# Patient Record
Sex: Female | Born: 1970 | Race: White | Hispanic: Yes | Smoking: Never smoker
Health system: Southern US, Community
[De-identification: ages and names within clinical notes are randomized; demographics above are authoritative.]

## PROBLEM LIST (undated history)

## (undated) DIAGNOSIS — K589 Irritable bowel syndrome without diarrhea: Secondary | ICD-10-CM

## (undated) DIAGNOSIS — F419 Anxiety disorder, unspecified: Secondary | ICD-10-CM

## (undated) DIAGNOSIS — R0602 Shortness of breath: Secondary | ICD-10-CM

## (undated) DIAGNOSIS — R079 Chest pain, unspecified: Secondary | ICD-10-CM

## (undated) DIAGNOSIS — F329 Major depressive disorder, single episode, unspecified: Secondary | ICD-10-CM

## (undated) DIAGNOSIS — Z9889 Other specified postprocedural states: Secondary | ICD-10-CM

## (undated) DIAGNOSIS — H6690 Otitis media, unspecified, unspecified ear: Secondary | ICD-10-CM

## (undated) DIAGNOSIS — G709 Myoneural disorder, unspecified: Secondary | ICD-10-CM

## (undated) DIAGNOSIS — Z889 Allergy status to unspecified drugs, medicaments and biological substances status: Secondary | ICD-10-CM

## (undated) DIAGNOSIS — T7840XA Allergy, unspecified, initial encounter: Secondary | ICD-10-CM

## (undated) DIAGNOSIS — R112 Nausea with vomiting, unspecified: Secondary | ICD-10-CM

## (undated) DIAGNOSIS — B009 Herpesviral infection, unspecified: Secondary | ICD-10-CM

## (undated) DIAGNOSIS — N951 Menopausal and female climacteric states: Secondary | ICD-10-CM

## (undated) DIAGNOSIS — N3010 Interstitial cystitis (chronic) without hematuria: Secondary | ICD-10-CM

## (undated) DIAGNOSIS — M25512 Pain in left shoulder: Secondary | ICD-10-CM

## (undated) DIAGNOSIS — R41840 Attention and concentration deficit: Secondary | ICD-10-CM

## (undated) DIAGNOSIS — L659 Nonscarring hair loss, unspecified: Secondary | ICD-10-CM

## (undated) DIAGNOSIS — N390 Urinary tract infection, site not specified: Secondary | ICD-10-CM

## (undated) HISTORY — PX: LAPAROSCOPY: SHX197

## (undated) HISTORY — DX: Nonscarring hair loss, unspecified: L65.9

## (undated) HISTORY — DX: Anxiety disorder, unspecified: F41.9

## (undated) HISTORY — DX: Myoneural disorder, unspecified: G70.9

## (undated) HISTORY — DX: Major depressive disorder, single episode, unspecified: F32.9

## (undated) HISTORY — DX: Irritable bowel syndrome, unspecified: K58.9

## (undated) HISTORY — PX: TUBAL LIGATION: SHX77

## (undated) HISTORY — DX: Chest pain, unspecified: R07.9

## (undated) HISTORY — DX: Allergy, unspecified, initial encounter: T78.40XA

## (undated) HISTORY — DX: Urinary tract infection, site not specified: N39.0

## (undated) HISTORY — DX: Herpesviral infection, unspecified: B00.9

## (undated) HISTORY — DX: Allergy status to unspecified drugs, medicaments and biological substances status: Z88.9

## (undated) HISTORY — DX: Shortness of breath: R06.02

## (undated) HISTORY — DX: Otitis media, unspecified, unspecified ear: H66.90

## (undated) HISTORY — DX: Menopausal and female climacteric states: N95.1

## (undated) HISTORY — DX: Pain in left shoulder: M25.512

## (undated) HISTORY — DX: Interstitial cystitis (chronic) without hematuria: N30.10

## (undated) HISTORY — DX: Attention and concentration deficit: R41.840

---
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
## 1998-08-28 ENCOUNTER — Other Ambulatory Visit: Admission: RE | Admit: 1998-08-28 | Discharge: 1998-08-28 | Payer: Self-pay | Admitting: Obstetrics and Gynecology

## 1999-04-17 ENCOUNTER — Ambulatory Visit (HOSPITAL_COMMUNITY): Admission: RE | Admit: 1999-04-17 | Discharge: 1999-04-17 | Payer: Self-pay | Admitting: Urology

## 1999-07-30 ENCOUNTER — Other Ambulatory Visit: Admission: RE | Admit: 1999-07-30 | Discharge: 1999-07-30 | Payer: Self-pay | Admitting: Obstetrics and Gynecology

## 1999-08-04 ENCOUNTER — Ambulatory Visit (HOSPITAL_COMMUNITY): Admission: RE | Admit: 1999-08-04 | Discharge: 1999-08-04 | Payer: Self-pay | Admitting: Gastroenterology

## 2000-03-12 ENCOUNTER — Inpatient Hospital Stay (HOSPITAL_COMMUNITY): Admission: AD | Admit: 2000-03-12 | Discharge: 2000-03-12 | Payer: Self-pay | Admitting: Obstetrics and Gynecology

## 2000-03-18 ENCOUNTER — Inpatient Hospital Stay (HOSPITAL_COMMUNITY): Admission: AD | Admit: 2000-03-18 | Discharge: 2000-03-18 | Payer: Self-pay | Admitting: Obstetrics and Gynecology

## 2000-03-22 ENCOUNTER — Inpatient Hospital Stay (HOSPITAL_COMMUNITY): Admission: AD | Admit: 2000-03-22 | Discharge: 2000-03-22 | Payer: Self-pay | Admitting: Obstetrics & Gynecology

## 2000-04-09 ENCOUNTER — Inpatient Hospital Stay (HOSPITAL_COMMUNITY): Admission: AD | Admit: 2000-04-09 | Discharge: 2000-04-09 | Payer: Self-pay | Admitting: Obstetrics and Gynecology

## 2000-04-18 ENCOUNTER — Inpatient Hospital Stay (HOSPITAL_COMMUNITY): Admission: AD | Admit: 2000-04-18 | Discharge: 2000-04-21 | Payer: Self-pay | Admitting: Obstetrics and Gynecology

## 2000-04-18 ENCOUNTER — Encounter (INDEPENDENT_AMBULATORY_CARE_PROVIDER_SITE_OTHER): Payer: Self-pay

## 2000-05-12 ENCOUNTER — Encounter: Admission: RE | Admit: 2000-05-12 | Discharge: 2000-08-10 | Payer: Self-pay | Admitting: Obstetrics and Gynecology

## 2000-05-20 ENCOUNTER — Other Ambulatory Visit: Admission: RE | Admit: 2000-05-20 | Discharge: 2000-05-20 | Payer: Self-pay | Admitting: Obstetrics and Gynecology

## 2000-05-31 ENCOUNTER — Encounter: Payer: Self-pay | Admitting: Obstetrics and Gynecology

## 2000-05-31 ENCOUNTER — Encounter: Admission: RE | Admit: 2000-05-31 | Discharge: 2000-05-31 | Payer: Self-pay | Admitting: Obstetrics and Gynecology

## 2000-11-14 ENCOUNTER — Encounter: Admission: RE | Admit: 2000-11-14 | Discharge: 2000-11-14 | Payer: Self-pay | Admitting: Obstetrics and Gynecology

## 2000-11-14 ENCOUNTER — Encounter: Payer: Self-pay | Admitting: Obstetrics and Gynecology

## 2001-05-25 ENCOUNTER — Other Ambulatory Visit: Admission: RE | Admit: 2001-05-25 | Discharge: 2001-05-25 | Payer: Self-pay | Admitting: Obstetrics and Gynecology

## 2001-08-31 ENCOUNTER — Other Ambulatory Visit: Admission: RE | Admit: 2001-08-31 | Discharge: 2001-08-31 | Payer: Self-pay | Admitting: Obstetrics and Gynecology

## 2001-11-01 HISTORY — PX: RHINOPLASTY: SUR1284

## 2002-05-25 ENCOUNTER — Other Ambulatory Visit: Admission: RE | Admit: 2002-05-25 | Discharge: 2002-05-25 | Payer: Self-pay | Admitting: Obstetrics and Gynecology

## 2002-07-03 ENCOUNTER — Ambulatory Visit (HOSPITAL_COMMUNITY): Admission: RE | Admit: 2002-07-03 | Discharge: 2002-07-03 | Payer: Self-pay | Admitting: Obstetrics and Gynecology

## 2002-07-03 ENCOUNTER — Encounter: Payer: Self-pay | Admitting: Obstetrics and Gynecology

## 2002-07-04 ENCOUNTER — Encounter: Payer: Self-pay | Admitting: Family Medicine

## 2002-07-04 ENCOUNTER — Encounter: Admission: RE | Admit: 2002-07-04 | Discharge: 2002-07-04 | Payer: Self-pay | Admitting: Family Medicine

## 2002-07-06 ENCOUNTER — Encounter: Payer: Self-pay | Admitting: Obstetrics and Gynecology

## 2002-07-06 ENCOUNTER — Encounter: Admission: RE | Admit: 2002-07-06 | Discharge: 2002-07-06 | Payer: Self-pay | Admitting: Obstetrics and Gynecology

## 2003-05-27 ENCOUNTER — Other Ambulatory Visit: Admission: RE | Admit: 2003-05-27 | Discharge: 2003-05-27 | Payer: Self-pay | Admitting: Obstetrics and Gynecology

## 2003-08-02 ENCOUNTER — Encounter: Payer: Self-pay | Admitting: Obstetrics and Gynecology

## 2003-08-02 ENCOUNTER — Inpatient Hospital Stay (HOSPITAL_COMMUNITY): Admission: AD | Admit: 2003-08-02 | Discharge: 2003-08-02 | Payer: Self-pay | Admitting: Obstetrics and Gynecology

## 2003-09-21 ENCOUNTER — Inpatient Hospital Stay (HOSPITAL_COMMUNITY): Admission: AD | Admit: 2003-09-21 | Discharge: 2003-09-21 | Payer: Self-pay | Admitting: Obstetrics and Gynecology

## 2003-10-05 ENCOUNTER — Inpatient Hospital Stay (HOSPITAL_COMMUNITY): Admission: AD | Admit: 2003-10-05 | Discharge: 2003-10-05 | Payer: Self-pay | Admitting: Obstetrics and Gynecology

## 2003-10-13 ENCOUNTER — Inpatient Hospital Stay (HOSPITAL_COMMUNITY): Admission: AD | Admit: 2003-10-13 | Discharge: 2003-10-14 | Payer: Self-pay | Admitting: Obstetrics and Gynecology

## 2003-10-13 ENCOUNTER — Inpatient Hospital Stay (HOSPITAL_COMMUNITY): Admission: AD | Admit: 2003-10-13 | Discharge: 2003-10-13 | Payer: Self-pay | Admitting: Obstetrics and Gynecology

## 2003-10-16 ENCOUNTER — Inpatient Hospital Stay (HOSPITAL_COMMUNITY): Admission: AD | Admit: 2003-10-16 | Discharge: 2003-10-16 | Payer: Self-pay | Admitting: Obstetrics and Gynecology

## 2003-10-21 ENCOUNTER — Inpatient Hospital Stay (HOSPITAL_COMMUNITY): Admission: AD | Admit: 2003-10-21 | Discharge: 2003-10-21 | Payer: Self-pay | Admitting: *Deleted

## 2003-10-28 ENCOUNTER — Inpatient Hospital Stay (HOSPITAL_COMMUNITY): Admission: AD | Admit: 2003-10-28 | Discharge: 2003-10-28 | Payer: Self-pay | Admitting: Obstetrics and Gynecology

## 2003-10-31 ENCOUNTER — Inpatient Hospital Stay (HOSPITAL_COMMUNITY): Admission: RE | Admit: 2003-10-31 | Discharge: 2003-11-04 | Payer: Self-pay | Admitting: Obstetrics and Gynecology

## 2003-10-31 ENCOUNTER — Encounter (INDEPENDENT_AMBULATORY_CARE_PROVIDER_SITE_OTHER): Payer: Self-pay | Admitting: Specialist

## 2003-11-05 ENCOUNTER — Encounter: Admission: RE | Admit: 2003-11-05 | Discharge: 2003-12-05 | Payer: Self-pay | Admitting: Obstetrics and Gynecology

## 2003-12-06 ENCOUNTER — Encounter: Admission: RE | Admit: 2003-12-06 | Discharge: 2004-01-05 | Payer: Self-pay | Admitting: Obstetrics and Gynecology

## 2003-12-09 ENCOUNTER — Other Ambulatory Visit: Admission: RE | Admit: 2003-12-09 | Discharge: 2003-12-09 | Payer: Self-pay | Admitting: Obstetrics and Gynecology

## 2004-02-03 ENCOUNTER — Encounter: Admission: RE | Admit: 2004-02-03 | Discharge: 2004-03-04 | Payer: Self-pay | Admitting: Obstetrics and Gynecology

## 2004-04-04 ENCOUNTER — Encounter: Admission: RE | Admit: 2004-04-04 | Discharge: 2004-05-04 | Payer: Self-pay | Admitting: Obstetrics and Gynecology

## 2004-06-04 ENCOUNTER — Encounter: Admission: RE | Admit: 2004-06-04 | Discharge: 2004-07-04 | Payer: Self-pay | Admitting: Obstetrics and Gynecology

## 2004-07-05 ENCOUNTER — Encounter: Admission: RE | Admit: 2004-07-05 | Discharge: 2004-08-04 | Payer: Self-pay | Admitting: Obstetrics and Gynecology

## 2005-01-18 ENCOUNTER — Other Ambulatory Visit: Admission: RE | Admit: 2005-01-18 | Discharge: 2005-01-18 | Payer: Self-pay | Admitting: Obstetrics and Gynecology

## 2005-03-19 ENCOUNTER — Ambulatory Visit: Payer: Self-pay

## 2005-04-02 ENCOUNTER — Ambulatory Visit (HOSPITAL_COMMUNITY): Admission: RE | Admit: 2005-04-02 | Discharge: 2005-04-02 | Payer: Self-pay | Admitting: Obstetrics and Gynecology

## 2005-04-04 ENCOUNTER — Inpatient Hospital Stay (HOSPITAL_COMMUNITY): Admission: AD | Admit: 2005-04-04 | Discharge: 2005-04-04 | Payer: Self-pay | Admitting: Specialist

## 2005-11-01 HISTORY — PX: BREAST ENHANCEMENT SURGERY: SHX7

## 2005-11-01 HISTORY — PX: COLONOSCOPY: SHX174

## 2006-01-31 ENCOUNTER — Encounter: Admission: RE | Admit: 2006-01-31 | Discharge: 2006-01-31 | Payer: Self-pay | Admitting: Plastic Surgery

## 2006-02-09 ENCOUNTER — Other Ambulatory Visit: Admission: RE | Admit: 2006-02-09 | Discharge: 2006-02-09 | Payer: Self-pay | Admitting: Obstetrics and Gynecology

## 2006-08-04 ENCOUNTER — Ambulatory Visit: Payer: Self-pay | Admitting: Obstetrics & Gynecology

## 2006-09-20 ENCOUNTER — Ambulatory Visit (HOSPITAL_COMMUNITY): Admission: RE | Admit: 2006-09-20 | Discharge: 2006-09-20 | Payer: Self-pay | Admitting: Gynecology

## 2007-01-24 ENCOUNTER — Encounter: Admission: RE | Admit: 2007-01-24 | Discharge: 2007-04-24 | Payer: Self-pay | Admitting: Family Medicine

## 2007-02-16 ENCOUNTER — Encounter: Admission: RE | Admit: 2007-02-16 | Discharge: 2007-02-16 | Payer: Self-pay | Admitting: Obstetrics & Gynecology

## 2007-04-12 ENCOUNTER — Encounter: Payer: Self-pay | Admitting: Obstetrics & Gynecology

## 2007-04-12 ENCOUNTER — Ambulatory Visit: Payer: Self-pay | Admitting: Obstetrics & Gynecology

## 2007-05-04 ENCOUNTER — Encounter: Payer: Self-pay | Admitting: Cardiology

## 2007-05-24 ENCOUNTER — Encounter: Payer: Self-pay | Admitting: Cardiology

## 2007-06-22 ENCOUNTER — Ambulatory Visit: Payer: Self-pay | Admitting: Obstetrics & Gynecology

## 2007-08-10 ENCOUNTER — Ambulatory Visit: Payer: Self-pay | Admitting: Obstetrics & Gynecology

## 2007-08-29 ENCOUNTER — Ambulatory Visit (HOSPITAL_BASED_OUTPATIENT_CLINIC_OR_DEPARTMENT_OTHER): Admission: RE | Admit: 2007-08-29 | Discharge: 2007-08-29 | Payer: Self-pay | Admitting: Urology

## 2008-04-26 ENCOUNTER — Ambulatory Visit (HOSPITAL_COMMUNITY): Admission: RE | Admit: 2008-04-26 | Discharge: 2008-04-26 | Payer: Self-pay | Admitting: Gynecology

## 2008-04-26 ENCOUNTER — Ambulatory Visit: Payer: Self-pay | Admitting: Obstetrics & Gynecology

## 2008-05-15 ENCOUNTER — Ambulatory Visit: Payer: Self-pay | Admitting: Obstetrics & Gynecology

## 2008-05-15 ENCOUNTER — Encounter: Payer: Self-pay | Admitting: Obstetrics & Gynecology

## 2008-05-21 ENCOUNTER — Ambulatory Visit (HOSPITAL_COMMUNITY): Admission: RE | Admit: 2008-05-21 | Discharge: 2008-05-21 | Payer: Self-pay | Admitting: Obstetrics & Gynecology

## 2008-07-15 ENCOUNTER — Ambulatory Visit: Payer: Self-pay | Admitting: Obstetrics & Gynecology

## 2008-09-23 ENCOUNTER — Ambulatory Visit: Payer: Self-pay | Admitting: Obstetrics & Gynecology

## 2008-09-23 LAB — CONVERTED CEMR LAB
Chlamydia, DNA Probe: NEGATIVE
Clue Cells Wet Prep HPF POC: NONE SEEN
GC Probe Amp, Genital: NEGATIVE
Trich, Wet Prep: NONE SEEN
WBC, Wet Prep HPF POC: NONE SEEN

## 2008-11-29 ENCOUNTER — Encounter: Admission: RE | Admit: 2008-11-29 | Discharge: 2008-11-29 | Payer: Self-pay | Admitting: Obstetrics & Gynecology

## 2009-02-13 ENCOUNTER — Ambulatory Visit: Payer: Self-pay | Admitting: Obstetrics & Gynecology

## 2009-02-14 ENCOUNTER — Encounter: Admission: RE | Admit: 2009-02-14 | Discharge: 2009-02-14 | Payer: Self-pay | Admitting: Obstetrics & Gynecology

## 2009-05-29 ENCOUNTER — Ambulatory Visit: Payer: Self-pay | Admitting: Obstetrics & Gynecology

## 2009-05-29 ENCOUNTER — Encounter: Payer: Self-pay | Admitting: Obstetrics & Gynecology

## 2009-05-29 LAB — CONVERTED CEMR LAB
Albumin: 4.8 g/dL (ref 3.5–5.2)
CO2: 27 meq/L (ref 19–32)
Chloride: 107 meq/L (ref 96–112)
Cholesterol: 154 mg/dL (ref 0–200)
Platelets: 183 10*3/uL (ref 150–400)
RDW: 13.3 % (ref 11.5–15.5)
Sodium: 142 meq/L (ref 135–145)
Total Protein: 6.7 g/dL (ref 6.0–8.3)
Triglycerides: 46 mg/dL (ref <150)
WBC: 3.9 10*3/uL — ABNORMAL LOW (ref 4.0–10.5)

## 2009-06-03 ENCOUNTER — Ambulatory Visit: Payer: Self-pay | Admitting: Obstetrics & Gynecology

## 2009-06-03 ENCOUNTER — Encounter: Payer: Self-pay | Admitting: Obstetrics & Gynecology

## 2009-06-03 LAB — CONVERTED CEMR LAB: HCV Ab: NEGATIVE

## 2009-06-23 ENCOUNTER — Encounter: Admission: RE | Admit: 2009-06-23 | Discharge: 2009-06-23 | Payer: Self-pay | Admitting: Obstetrics & Gynecology

## 2009-06-25 ENCOUNTER — Ambulatory Visit (HOSPITAL_COMMUNITY): Admission: RE | Admit: 2009-06-25 | Discharge: 2009-06-25 | Payer: Self-pay | Admitting: Obstetrics & Gynecology

## 2009-07-16 ENCOUNTER — Ambulatory Visit: Payer: Self-pay | Admitting: Obstetrics & Gynecology

## 2009-07-31 ENCOUNTER — Ambulatory Visit: Payer: Self-pay | Admitting: Obstetrics & Gynecology

## 2009-07-31 LAB — CONVERTED CEMR LAB
Chlamydia, Swab/Urine, PCR: NEGATIVE
GC Probe Amp, Urine: NEGATIVE

## 2009-11-01 HISTORY — PX: UPPER GASTROINTESTINAL ENDOSCOPY: SHX188

## 2009-12-01 ENCOUNTER — Encounter: Admission: RE | Admit: 2009-12-01 | Discharge: 2009-12-01 | Payer: Self-pay | Admitting: Obstetrics & Gynecology

## 2009-12-02 ENCOUNTER — Ambulatory Visit: Payer: Self-pay | Admitting: Obstetrics & Gynecology

## 2009-12-02 LAB — CONVERTED CEMR LAB
Platelets: 187 10*3/uL (ref 150–400)
RDW: 12.9 % (ref 11.5–15.5)
TSH: 1.682 microintl units/mL (ref 0.350–4.500)
WBC: 6.4 10*3/uL (ref 4.0–10.5)

## 2010-01-26 ENCOUNTER — Ambulatory Visit: Payer: Self-pay | Admitting: Obstetrics & Gynecology

## 2010-01-27 ENCOUNTER — Encounter: Payer: Self-pay | Admitting: Obstetrics & Gynecology

## 2010-06-26 ENCOUNTER — Ambulatory Visit: Payer: Self-pay | Admitting: Gynecology

## 2010-06-26 ENCOUNTER — Other Ambulatory Visit: Admission: RE | Admit: 2010-06-26 | Discharge: 2010-06-26 | Payer: Self-pay | Admitting: Gynecology

## 2010-07-03 ENCOUNTER — Ambulatory Visit: Payer: Self-pay | Admitting: Gynecology

## 2010-09-16 ENCOUNTER — Ambulatory Visit: Payer: Self-pay | Admitting: Gynecology

## 2010-09-17 ENCOUNTER — Ambulatory Visit: Payer: Self-pay | Admitting: Gynecology

## 2010-09-22 ENCOUNTER — Encounter: Payer: Self-pay | Admitting: Cardiology

## 2010-09-22 DIAGNOSIS — R079 Chest pain, unspecified: Secondary | ICD-10-CM

## 2010-09-22 DIAGNOSIS — R0602 Shortness of breath: Secondary | ICD-10-CM

## 2010-09-22 HISTORY — DX: Shortness of breath: R06.02

## 2010-09-22 HISTORY — DX: Chest pain, unspecified: R07.9

## 2010-09-29 ENCOUNTER — Encounter: Payer: Self-pay | Admitting: Cardiology

## 2010-11-22 ENCOUNTER — Encounter: Payer: Self-pay | Admitting: Plastic Surgery

## 2010-12-14 ENCOUNTER — Other Ambulatory Visit: Payer: Self-pay | Admitting: Gynecology

## 2010-12-14 DIAGNOSIS — Z1231 Encounter for screening mammogram for malignant neoplasm of breast: Secondary | ICD-10-CM

## 2010-12-30 ENCOUNTER — Ambulatory Visit
Admission: RE | Admit: 2010-12-30 | Discharge: 2010-12-30 | Disposition: A | Discharge status: Home / Self Care | Payer: BC Managed Care – PPO | Source: Ambulatory Visit | Attending: Gynecology | Admitting: Gynecology

## 2010-12-30 DIAGNOSIS — Z1231 Encounter for screening mammogram for malignant neoplasm of breast: Secondary | ICD-10-CM

## 2011-01-01 ENCOUNTER — Other Ambulatory Visit: Payer: Self-pay | Admitting: Gynecology

## 2011-01-01 DIAGNOSIS — R928 Other abnormal and inconclusive findings on diagnostic imaging of breast: Secondary | ICD-10-CM

## 2011-01-12 ENCOUNTER — Ambulatory Visit
Admission: RE | Admit: 2011-01-12 | Discharge: 2011-01-12 | Disposition: A | Discharge status: Home / Self Care | Payer: BC Managed Care – PPO | Source: Ambulatory Visit | Attending: Gynecology | Admitting: Gynecology

## 2011-01-12 DIAGNOSIS — R928 Other abnormal and inconclusive findings on diagnostic imaging of breast: Secondary | ICD-10-CM

## 2011-02-05 LAB — POCT URINALYSIS DIP (DEVICE)
Nitrite: NEGATIVE
Protein, ur: NEGATIVE mg/dL
Urobilinogen, UA: 0.2 mg/dL (ref 0.0–1.0)

## 2011-03-16 NOTE — Group Therapy Note (Signed)
[name], [name]             ACCOUNT NO.:  [id number]   MEDICAL RECORD NO.:  [id number]          PATIENT TYPE:  WOC   LOCATION[facility]                   FACILITY:  [facility]   PHYSICIAN:  Elsie Lincoln, MD      DATE OF BIRTH:  1971/02/23   DATE OF SERVICE:  02/13/2009                                  CLINIC NOTE   Patient is a 40 year old female who presents for complaints of feeling a  left mass in her axilla/breast.  This is new to her.  She found it in  the shower this morning.  No other complaints.  No discharge from the  nipple and no skin changes.   PHYSICAL EXAM:  Temperature 99.1.  Pulse 80.  Blood pressure 103/58.  Weight 118.4.  Height 5 feet 6 inches.  GENERAL:  Well nourished, well developed, no apparent distress.  HEENT:  Normocephalic, atraumatic.  CHEST:  Normal movement.  Left breast, no nipple discharge, skin changes  or mass.  When standing up on the left side when you pull her chest wall  in between your 2 fingers near her axilla, you can feel a slight bump.  This could possibly be a mass in the breast tissue, a lymph node, or a  muscle spasm in the pectoralis.   Patient is requesting followup scans.  I think this would be reasonable  so we ordered a diagnostic mammogram/ultrasound of the left axilla  breast.  The patient is scheduled for tomorrow and we will get the  followup results and contact the patient.  Patient will return p.r.n.           ______________________________  Elsie Lincoln, MD     KL/MEDQ  D:  02/13/2009  T:  02/13/2009  Job:  [id number]

## 2011-03-16 NOTE — Assessment & Plan Note (Signed)
NAMEMISSY, BAKSH             ACCOUNT NO.:  [id number]   MEDICAL RECORD NO.:  [id number]          PATIENT TYPE:  POB   LOCATION:  [facility]         FACILITY:  [facility]   PHYSICIAN:  Elsie Lincoln, MD      DATE OF BIRTH:  1971-05-15   DATE OF SERVICE:  05/15/2008                                  CLINIC NOTE   The patient is a 40 year old para 2 female, LMP May 08, 2008, who  presents for a physical exam.  The patient has been experiencing some  right lower quadrant pain intermittently and now daily that lasted for a  little bit and goes away.  She has had transvaginal ultrasound done on  April 26, 2008, which showed completely normal-appearing ovaries and  uterus.  There was a small amount of simple free fluid in the cul-de-  sac, possibly had ruptured a cyst.  She also does have interstitial  cystitis and irritable bowel syndrome, and she thinks she may have  fibromyalgia, which has never been diagnosed.  Dr. Logan Bores follows her  interstitial cystitis.  She thinks that she might be getting a flare.  I  did do urine culture recently, and it was negative; this was done at the  [facility] [place].  She also is followed by [facility] for  irritable bowel syndrome and had a recent negative colonoscopy.  She  could be having pain from gas or irritable bowel syndrome, and if the  pain continues I think she should at least talk to her  gastroenterologist.  There is a rare chance she could have chronic  appendicitis, so we could do a CT with IV and p.o. contrast.  She could  have scar tissue from her prior 2 C-sections, however, I find this  unlikely given that she has not experienced the pain 4 years after  delivery.  Knowing [place] very well, she does have a high anxiety  level when it comes to her health.  She has this longevity plan and  wants everything investigated to the hilt.  I warned her about doing  tests, that they may find some findings that would need to be  investigated but have no real pathological reason to.  I will be very  hesitant to do an exploratory laparoscopy on her given that I think that  this could just be normal pain associated with ovulation or it could be  related to her irritable bowel syndrome.  She was on Wellbutrin for  possible decreased mood, maybe depression.  She said it started causing  her daily headaches, and she is weaning herself off this.  I do think  she would benefit from some SSRI; however, she does not want any sexual  side effects associated with SSRIs.  If she continues to have daily  headaches off the Wellbutrin then I would suggest she go see her primary  care physician to evaluate chronic headaches.  The patient uses BTL and  vasectomy for birth control.   PAST MEDICAL HISTORY:  1. Irritable bowel syndrome.  2. Borderline hypercholesterolemia controlled with diet, exercise, and      fish oil.  3. Interstitial cystitis.   PAST SURGICAL HISTORY:  C-section x2, breast augmentation in May 2007,  and laparoscopy in May 2006 by Dr. Henderson Cloud.   GYNECOLOGICAL HISTORY:  No abnormal Pap smear.  C-section x2, 1  miscarriage, and tubal ligation.  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   FAMILY HISTORY:  Brother has high blood pressure.  The patient denies  diabetes or familial cancers, and knows nothing about her father's  family history as he is not involved in her life.   SOCIAL HISTORY:  No drinking, drugs, or tobacco.  She exercises a lot  and, of note, she has had a 13-pound weight loss since she was last  here.  She was trying to lose some weight; however, also again knowing  [place] personally, I do know that her portions have decreased  tremendously.  She states she is not trying to lose weight; however, I  am slightly worried that she has had a 13-pound weight loss and does not  seem to be eating larger portions.  However, we did calculate her BMI,  and it is 19.5 and it is normal.  She does exercise but  not overly so.  I will keep an eye on her weight over the next year to make sure she  does not decrease to underweight.  Also of note, she had Botox recently  around her eyes, and she has some bruising around her eyes.   MEDICATION:  1. Metamucil Plus Calcium.  2. Unpasteurized apple vinegar.  3. Green tea.  4. Juice Plus.  5. Fish oil capsule.  6. Valtrex.  7. Wellbutrin.  8. Flonase.  9. Allegra-D.   PHYSICAL EXAMINATION:  GENERAL:  Well nourished, well developed, in no  apparent distress.  VITAL SIGNS:  Pulse 70, blood pressure 94/58, and weight 120.  HEENT:  Normocephalic.  Slight bruising underneath her right eye from  Botox.  Good dentition.  THYROID:  No masses.  LUNGS:  Clear to auscultation bilaterally.  HEART:  Regular rate and rhythm.  BREASTS:  Evidence of implants.  No nipple discharge.  No  lymphadenopathy.  No masses.  ABDOMEN:  Soft, nontender.  No organomegaly.  No hernia.  No pain with  palpation.  GENITALIA:  Tanner V.  Vagina pink, normal rugae.  Urethra and bladder  well suspended.  Cervix closed, nontender.  Uterus anteverted,  nontender.  Adnexa:  Both ovaries palpated non painfully and small.  The  patient had some pain to deep palpation on bimanual but not in the area  of the ovary in the right lower quadrant.  EXTREMITIES:  No edema.   ASSESSMENT AND PLAN:  A 40 year old female with normal gynecological  exam.  1. Pap smear.  2. TSH, cholesterol panel, and basic metabolic panel today.  She is      fasting.  3. Mammogram next year when she is 38.  4. Right lower quadrant pain.  I do not think seems to be      gynecological in origin.  If she continues to have the pain, I      would suggest that she go see her gastrointestinal doctor before      proceeding with any other invasive test.           ______________________________  Elsie Lincoln, MD     KL/MEDQ  D:  05/15/2008  T:  05/16/2008  Job:  [id number]

## 2011-03-16 NOTE — Group Therapy Note (Signed)
[name], [name]             ACCOUNT NO.:  [id number]   MEDICAL RECORD NO.:  [id number]          PATIENT TYPE:  WOC   [facility]ATION[facility]  [facility]                   FACILITY:  [facility]   PHYSICIAN:  Elsie Lincoln, MD      DATE OF BIRTH:  Jan 13, 1971   DATE OF SERVICE:                                  CLINIC NOTE   The patient is a 40 year old para 2 female who presents with urethral  pain, right lower quadrant pain, and burning.  The patient is a known  patient of Dr. Logan Bores from [facility] for interstitial cystitis.  He is now into [facility], and she is attempting to reinitiate care  with Dr. Alfredo Martinez, and also to get a second opinion.  This is  her second flare in the past 3 months.  She is on Elmiron currently, and  I have prescribed her tramadol p.r.n.  Over the past couple of months,  she has gone through approximately 35 pills in 3 months.  The patient is  due to see Dr. Sherron Monday on October 14, 2008.  The patient was given  Diflucan empirically over the weekend, which did not improve the  symptoms.  She was able to have sex last night, which was a little less  painful than it had the last time they attempted.   PHYSICAL EXAMINATION:  Vulva slightly erythematous.  Vagina pink, normal  rugae with thin amount of discharge.  It could be semen from the  intercourse several hours ago.  Cervix close, nontender.  Urethra mildly  tender.  Bladder nontender.  Uterus mildly tender.  Right lower quadrant  moderately tender.  No masses.  Left adnexa no masses, nontender.   ASSESSMENT AND PLAN:  A 40 year old female with pelvic pain.  1. Cystitis.  I spoke with Dr. Alfredo Martinez, discussed case with      him, and he suggested Mobic and Prilosec.  I wrote these      prescriptions for her.  He will see her on October 14, 2008 and      determine if she needs an instillations of her bladder for      treatment.  2. Wet prep, yeast culture, gonorrhea, and chlamydia sent.  3.  Urinalysis sent, which showed positive nitrite, 30 mg of protein,      trace ketones, small bilirubin.  We will send a culture.  Usually,      the patient does not have any nitrates or protein, so that she      could also have an urinary tract infection.  4. The patient is 5 feet 6 inches and 115 pounds.  She has lost over      15 pounds in the past 2 years.  We have talked about this in depth,      and she states that when she is upset, she does not eat and when      she is in pain, she does not eat.  However, today when she weighed      in, she was very concerned about taking off her shoes and jacket in      order  to achieve the lowest reading.  I am somewhat concerned about      her weight loss and lack of eating.  Also, she has ketones in her      urine, which would show she has not eaten much in the morning.  We      will address this at a later visit.           ______________________________  Elsie Lincoln, MD     KL/MEDQ  D:  09/23/2008  T:  09/24/2008  Job:  [id number]

## 2011-03-16 NOTE — Assessment & Plan Note (Signed)
[name], [name]             ACCOUNT NO.:  [id number]   MEDICAL RECORD NO.:  [id number]          PATIENT TYPE:  POB   LOCATION:  [facility]         FACILITY:  [facility]   PHYSICIAN:  Elsie Lincoln, MD      DATE OF BIRTH:  02-Feb-1971   DATE OF SERVICE:  05/29/2009                                  CLINIC NOTE   The patient is a 40 year old female, who presents for yearly exam.  This  is an addendum to the last note.  The patient has been exercising and  has a normal body weight.  She still experiences lower calf achiness and  has been wearing compression stockings as she is today.  I believe that  the patient would benefit from more definitive therapy, and I am  referring her back to her pain specialist with this note.           ______________________________  Elsie Lincoln, MD     KL/MEDQ  D:  05/29/2009  T:  05/30/2009  Job:  [id number]

## 2011-03-16 NOTE — Assessment & Plan Note (Signed)
NAMEJAIDALYN, [name]             ACCOUNT NO.:  [id number]   MEDICAL RECORD NO.:  [id number]          PATIENT TYPE:  POB   LOCATION:  [facility]         FACILITY:  [facility]   PHYSICIAN:  Elsie Lincoln, MD      DATE OF BIRTH:  08/13/1971   DATE OF SERVICE:  07/16/2009                                  CLINIC NOTE   The patient is a 40 year old para 2 female who presents for abnormal  uterine bleeding.  The patient continues to have 9+ days periods with 21-  day cycle at most.  She had a transvaginal ultrasound that revealed a  normal uterus and ovaries, most likely this is due to her low BMI;  however, we are going to do endometrial biopsy today to ensure that  there is no problem with her endometrial lining.  Informed consent was  obtained.  The uterus sounded to approximately 8 cm and good tissue was  obtained without problem.  The patient will be called with the results.  UPT was negative prior to the procedure.           ______________________________  Elsie Lincoln, MD     KL/MEDQ  D:  07/16/2009  T:  07/16/2009  Job:  [id number]

## 2011-03-16 NOTE — Assessment & Plan Note (Signed)
[name], SHIDLER             ACCOUNT NO.:  [id number]   MEDICAL RECORD NO.:  [id number]          PATIENT TYPE:  POB   LOCATION:  [facility]         FACILITY:  [facility]   PHYSICIAN:  Elsie Lincoln, MD      DATE OF BIRTH:  [birth year]   DATE OF SERVICE:  08/04/2006                                    CLINIC NOTE   HISTORY OF PRESENT ILLNESS:  The patient is a 40 year old G64, P2-1-0-2  female, LMP July 20, 2006 who presents for recurrent UTI.  The patient  has a history of interstitial cystitis as well.  She has been treated for a  Klebsiella infection ___________ with Cipro.  She then developed urethritis,  was treated with Keflex for 7 days.  She still feels some pressure in her  urethra and bladder and unsure if it is an IC flare versus a urinary tract  infection.  She has had sex twice in the past 2 days and feels like it is  worse after sex.  She does wipe front to back.  Urinalysis and urine culture  has already been sent today.  On physical exam, the vagina is clean with no  discharge.  Her urethra is a little tender but her bladder is actually more  tender.  She is not experiencing any dysuria, however.   ASSESSMENT/PLAN:  A 40 year old female with UTI versus interstitial  cystitis.  Will wait for urinalysis and culture to return before treating.  If urinalysis and urine culture are clear, the patient has an appointment  with Dr. Logan Bores of urology to followup to see if it is an interstitial flare.  The patient wants a stool guaiac kit sent as she is having a change in her  stool patterns.  She has already seen her gastroenterologist who is not  concerned.  He thinks it is due to her increased herbal supplements and  fiber; however, we will send this on patient request and will contact her if  this is abnormal.           ______________________________  Elsie Lincoln, MD     KL/MEDQ  D:  08/04/2006  T:  08/05/2006  Job:  [id number]

## 2011-03-16 NOTE — Op Note (Signed)
[name], [name]             ACCOUNT NO.:  [id number]   MEDICAL RECORD NO.:  [id number]          PATIENT TYPE:  AMB   LOCATION:  [facility]                         FACILITY:  [facility]   PHYSICIAN:  Jamison Neighbor, M.D.  DATE OF BIRTH:  1971-04-13   DATE OF PROCEDURE:  08/29/2007  DATE OF DISCHARGE:                               OPERATIVE REPORT   PREOPERATIVE DIAGNOSIS:  Interstitial cystitis.   POSTOPERATIVE DIAGNOSIS:  Interstitial cystitis.   PROCEDURE:  Cystoscopy, urethral calibration, hydrodistention of the  bladder, Marcaine and Pyridium installation, Marcaine and Kenalog  injection.   SURGEON:  Jamison Neighbor, M.D.   ANESTHESIA:  General.   COMPLICATIONS:  None.   DRAINS:  None.   BRIEF HISTORY:  This 40 year old female had past problem with  interstitial cystitis.  She was treated with cystoscopy and  hydrodistention followed by a course of Elmiron, Atarax as well as some  physical therapy.  The patient was quite stable until the past few  months when her situation worsened.  We note that she had been off  medication for quite some time.  Over the past few months she has had at  least three major flare-ups.  She really did not want to restart  medication or bladder installation therapy but requested a  hydrodistention be performed.  She knows that there is no guarantee it  will do the same thing it did previously but she had such a good  response, she elected to try that first.  She understands the risks and  benefits of the procedure and gave full informed consent.   PROCEDURE:  After successful induction of general anesthesia, the  patient was placed in the dorsal position, prepped with Betadine and  draped in the usual sterile fashion.  Careful bimanual examination  revealed a normal urethra with no signs of diverticulum, there was no  prolapse of any kind.  There was no cystocele, rectocele or enterocele  detected.  The urethra was calibrated to 32-French with  female urethral  sounds.  The cystoscope was inserted.  The bladder was carefully  inspected.  No tumors or stones could be seen. Both ureteral orifices  were normal in configuration and location.  Clear urine was seen to  efflux from each.  Hydrodistention of the bladder was performed.  The  bladder was distended at a pressure 100 cm of water for five minutes.  When the bladder was drained, granulation could be seen throughout the  bladder consistent with interstitial cystitis.  Bladder capacity was  actually just over 1000 mL.  The patient had no ulcers and nothing  required biopsy or fulguration.  The bladder was  drained.  A mixture of Marcaine and Pyridium was left in the bladder.  Marcaine and Kenalog were injected periurethrally.  The patient received  intraoperative Toradol, Zofran and a B & O suppository.  She tolerated  the procedure well and was taken to the recovery room in good condition.      Jamison Neighbor, M.D.  Electronically Signed     RJE/MEDQ  D:  08/29/2007  T:  08/29/2007  Job:  [id number]
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
## 2011-03-19 NOTE — H&P (Signed)
[name], [name]                       ACCOUNT NO.:  [id number]   MEDICAL RECORD NO.:  [id number]                   PATIENT TYPE:  MAT   LOCATION:                                       FACILITY:   PHYSICIAN:  Freddy Finner, M.D.                DATE OF BIRTH:  1971-06-15   DATE OF ADMISSION:  10/31/2003  DATE OF DISCHARGE:                                HISTORY & PHYSICAL   ADMISSION DIAGNOSES:  1. Intrauterine pregnancy at term.  2. Surgically scarred uterus by previous cesarean delivery.   HISTORY OF PRESENT ILLNESS:  The patient is a 40 year old white married  female (gravida 5, para 1) delivered by cesarean section.  The patient's  prenatal course has been essentially uncomplicated this time, but she is now  admitted at term for repeat cesarean section delivery.   Significant risk factors in her prenatal history include: advanced maternal  age, history of preterm labor and positive strep test.   REVIEW OF SYSTEMS:  Her current review of systems is essentially negative.  No current pulmonary, GI, or GU complaints.   PAST MEDICAL HISTORY:  As recorded in detail in the prenatal note and will  not be repeated.   ALLERGIES:  Her only known allergy is to SULFA.   PHYSICAL EXAMINATION:  HEENT:  Grossly within normal limits.  Thyroid gland  was not palpably enlarged.  VITAL SIGNS:  Blood pressure (in the office) 118/76.  CHEST:  Clear to auscultation.  HEART:  Normal sinus rhythm without murmurs, rubs or gallops.  ABDOMEN:  Gravid.  Estimated fetal weight of 7 pounds.  EXTREMITIES:  Without clubbing, cyanosis or edema.  PELVIC:  Deferred.  Cervical check one week prior to admission showed cervix  to be closed and thick.   ASSESSMENT:  Intrauterine pregnancy at term.   PLAN:  Repeat cesarean section delivery.                                               Freddy Finner, M.D.    WRN/MEDQ  D:  10/30/2003  T:  10/30/2003  Job:  [id number]

## 2011-03-19 NOTE — H&P (Signed)
[facility]  Patient:    [name], [name]                          MRN: [id number] Adm. Date:  04/18/00 Attending:  Guy Sandifer. Arleta Creek, M.D.                         History and Physical  CHIEF COMPLAINT:                  Possible herpes outbreak at term.  HISTORY OF PRESENT ILLNESS:       This patient is a 40 year old, married white female, G3, P0, AB2 with an EDC of April 30, 2000, established by first trimester ultrasound, and early examination places her at 38-1/2 weeks estimated gestational age.  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  She is one Valtrex suppression.  She continues to have prodromal symptoms over the last two weeks or so.  Inspection fails to reveal any lesions.  However, because of the patients persistent prodromal symptoms, it makes it impossible for her to distinguish whether an outbreak is coming on or not.  The options of vaginal delivery versus cesarean section have been carefully discussed. The risks and complications of vaginal delivery and cesarean section have also been discussed.  After careful consideration, the patient and her husband request cesarean section because of the possibility of early HSV outbreak.  PAST MEDICAL HISTORY:             1. HSV as above.                                   2. Irritable bowel syndrome.                                   3. History of posttraumatic stress disorder.                                   4. History of interstitial cystitis.  PAST SURGICAL HISTORY:            Tonsillectomy in 1982.  MEDICATIONS:                      Valtrex 500 mg daily, prenatal vitamin daily.  ALLERGIES:                        Question of a SULFA allergy.  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  FAMILY HISTORY:                   Mother with varicosities and anemia, maternal grandmother tuberculosis, maternal grandfather  diabetes, maternal grandmother uremia, mother and brother with depression.  REVIEW OF SYSTEMS:                Negative except as above.  PHYSICAL EXAMINATION:  VITAL SIGNS:                      Height 5 feet 5-1/2 inches, weight  167 pounds.  Blood pressure 118/78.  NECK:                             Without thyromegaly.  LUNGS:                            Clear to auscultation.  HEART:                            Regular rate and rhythm.  BACK:                             Without CVA tenderness.  BREASTS:                          Not examined.  ABDOMEN:                          Gravid, nontender.  PELVIC:                           Cervix 50% effaced, vertex presentation.  EXTREMITIES:                      Reveal 1+ edema.  Reflexes 2+ without clonus.  LABORATORY DATA:                  Group beta strep culture on Mar 12, 2000, is negative.  Blood type A positive, Rh antibody negative, VDRL negative, rubella titer positive, hepatitis B surface antigen negative, HIV negative, PPD negative, Pap test normal.  Gonorrhea and chlamydia cultures negative.  ASSESSMENT:                       1. Intrauterine pregnancy at 38-1/2 weeks                                      estimated gestational age.                                   2. Herpes prodrome.  PLAN:                             Primary cesarean section. DD:  04/16/00 TD:  04/16/00 Job: [id number]

## 2011-03-19 NOTE — H&P (Signed)
[name], HUBERT             ACCOUNT NO.:  [id number]   MEDICAL RECORD NO.:  [id number]          PATIENT TYPE:  AMB   LOCATION:  [facility]                           FACILITY:  [facility]   PHYSICIAN:  Guy Sandifer. Henderson Cloud, M.D. DATE OF BIRTH:  [birth year]   DATE OF ADMISSION:  04/02/2005  DATE OF DISCHARGE:                                HISTORY & PHYSICAL   CHIEF COMPLAINT:  Pelvic pain.   HISTORY OF PRESENT ILLNESS:  This patient is a 40 year old married female G5  P2 status post tubal ligation with recurrent, intermittent right lower  quadrant pain.  Ultrasound on 09/30/04 was essentially within normal limits.  There was a 1.5 cm irregular shaped cyst that was resolving at that time.  After discussing the options of management, she is admitted for laparoscopy.   PAST MEDICAL HISTORY:  1.  Post-traumatic stress syndrome.  2. History of interstitial cystitis.      3. History of irritable bowel syndrome.   PAST SURGICAL HISTORY:  Tonsillectomy in 1982.   OBSTETRIC HISTORY:  Cesarean section x2.  Tubal ligation with cesarean  section #2.   FAMILY HISTORY:  Mother with superficial varicosities and a history of  anemia.  Maternal grandmother with tuberculosis.  Diabetes in maternal  grandfather.  Uremia in maternal grandmother.   SOCIAL HISTORY:  The patient denies tobacco, alcohol, or drug abuse.   MEDICATIONS:  Zoloft 50 mg daily.   ALLERGIES:  No known drug allergies.   REVIEW OF SYSTEMS:  Neurologic:  Denies headache.  Cardiac:  Denies chest  pain.  Pulmonary:  Denies shortness of breath.  GI:  Denies recent changes  in bowel habits.   PHYSICAL EXAMINATION:  Height 5 feet 5-1/2 inches.  Weight 121 pounds, blood  pressure 86/52.  HEENT without thyromegaly.  Lungs are clear to  auscultation.  Heart:  Regular rate and rhythm.  Back:  No CVA tenderness.  Breasts without mass or discharge.  Abdomen is soft and nontender without  masses.  Pelvic exam:  Both vagina and cervix are without  lesions.  Uterus  is normal size, mobile, and nontender.  Neck is nontender without masses.  Extremities:  Neurologic exam grossly within normal limits.   ASSESSMENT:  Pelvic pain.   PLAN:  Laparoscopy.      JET/MEDQ  D:  03/31/2005  T:  03/31/2005  Job:  [id number]

## 2011-03-19 NOTE — Op Note (Signed)
[name], [name]             ACCOUNT NO.:  [id number]   MEDICAL RECORD NO.:  [id number]          PATIENT TYPE:  AMB   LOCATION:  [facility]                           FACILITY:  [facility]   PHYSICIAN:  Guy Sandifer. Henderson Cloud, M.D. DATE OF BIRTH:  05-Sep-1971   DATE OF PROCEDURE:  04/02/2005  DATE OF DISCHARGE:                                 OPERATIVE REPORT   PREOPERATIVE DIAGNOSIS:  Pelvic pain.   POSTOPERATIVE DIAGNOSIS:  Pelvic pain.   PROCEDURES:  Laparoscopy.   SURGEON:  Harold Hedge, M.D.   ANESTHESIA:  General with endotracheal intubation.   ESTIMATED BLOOD LOSS:  Drops.   INDICATIONS AND CONSENT:  This patient is a 40 year old, married, white  female, G5, P2, status post tubal ligation with recurrent intermittent right  lower quadrant pain.  Laparoscopy is discussed with the patient  preoperatively.  Potential risks and complications have been discussed  preoperatively including but not limited to infection, bowel, bladder,  ureteral damage, bleeding requiring transfusion of blood products with  possible transfusion reaction, HIV and hepatitis acquisition, DVT, PE,  pneumonia, recurrent pain, and dyspareunia.  All questions have been  answered, and consent is signed on the chart.   FINDINGS:  Upper abdomen is grossly normal.  The appendix is normal.  The  uterus is normal in size and contour.  Anterior cul-de-sac is normal.  Ovaries are normal.  Tubes are status post ligation, otherwise normal.  Pelvic sidewalls are normal.  In the posterior cul-de-sac, medial to the  uterosacral ligaments, there are bilateral window formations in the  peritoneum.  There are no obvious implants of endometriosis in either place.   DESCRIPTION OF PROCEDURE:  The patient is taken to the operating room where  she is identified, placed in dorsal supine position, and general anesthesia  is induced via endotracheal intubation.  She is then placed in the dorsal  lithotomy position where she is prepped,  abdominally and vaginally, bladder  straight-catheterized; Hulka tenaculum is placed in the uterus as a  manipulator, and she is draped in a sterile fashion.  Small umbilical  incision is made, and a disposable Veress needle is placed without  difficulty.  Syringe and drop test are normal.  Two liters of gas are  insufflated under low pressure.  There is good tympany in the right upper  quadrant.  The Veress needle is removed after 2 L of gas are insufflated,  and a disposable, nonbladed 10-11 trocar sleeve is placed.  Placement is  verified with the laparoscope, and no damage is noted to surrounding  structures.  Small suprapubic incision was made, and a 5 mm disposable,  nonbladed trocar sleeve is placed under direct visualization without  difficulty.  The above findings are noted.  The course of the uterus is  noted to be well away from the areas of surgery.  Bipolar cautery is done to  the periphery of the window on the right posterior cul-de-sac.  The window  formation on the left side is very difficult to expose, and only minimal  bipolar cautery is done at the periphery of the opening of this.  The  suprapubic trocar sleeve is then removed.  Pneumoperitoneum is reduced, and  good hemostasis is noted.  The umbilical trocar sleeve is removed as well.  A 3-0 Vicryl suture is used in a mattress fashion to close the skin on the  suprapubic incision to control a bleeder in the subcutaneous tissues.  Both  incisions are then injected with 0.5% plain Marcaine.  Dermabond is applied  to both incisions.  Hulka tenaculum is removed, and no bleeding is noted.  All counts are correct.  The patient is awakened and taken to the recovery  room in stable condition.      JET/MEDQ  D:  04/02/2005  T:  04/03/2005  Job:  [id number]

## 2011-03-19 NOTE — Discharge Summary (Signed)
[name], [name]                       ACCOUNT NO.:  [id number]   MEDICAL RECORD NO.:  [id number]                   PATIENT TYPE:  INP   LOCATION:  9124                                 FACILITY:  [facility]   PHYSICIAN:  Julio Sicks, N.P.                  DATE OF BIRTH:  05/10/1971   DATE OF ADMISSION:  10/31/2003  DATE OF DISCHARGE:  11/04/2003                                 DISCHARGE SUMMARY   ADMITTING DIAGNOSES:  1. Intrauterine pregnancy at term.  2. Previous cesarean delivery, desires repeat.  3. Multiparity, desires sterilization.   DISCHARGE DIAGNOSES:  1. Status post low-transverse cesarean section.  2. Viable female infant.  3. Permanent sterilization.   PROCEDURES:  1. Repeat low-transverse cesarean section.  2. Bilateral tubal ligation.  3. Collection of umbilical cord blood for salvage.   REASON FOR ADMISSION:  Please see dictated H&P.   HOSPITAL COURSE:  The patient is a 40 year old white, married female,  gravida 5 para 1, that was admitted to [facility] at term.  The patient had had a previous cesarean section and desired repeat.  The  patient also had requested permanent sterilization.  The patient then was  transferred to the operating room where spinal anesthesia was administered  without difficulty.  A low transverse incision was made with the delivery of  a viable female infant weighing 8 pounds 2 ounces with Apgar's of 9 at one  minute and 9 at five minutes, also a tubal ligation was performed without  difficulty.  The patient tolerated the procedure well and was taken to the  recovery room in stable condition.   On postoperative day one, vital signs were stable.  The patient was  afebrile.  Fundus was firm and nontender.  Incision was clean, dry and  intact.  Labs revealed a hemoglobin of 11.7, platelet count of 160,000, WBC  count of 8.4.  On postoperative day two, the patient did complain of some  increased pain at her incisional site  with some erythema.  Vital signs were  stable. The abdomen was slightly erythematous above the incision with  minimal tenderness.  No drainage was noted from the incisional site.  Question as to whether she was developing a wound cellulitis and was placed  on oral antibiotics.  On postoperative day three, vital signs were stable.  The patient remained afebrile; however, she did complain of some increased  pain at the incisional site.  Otherwise the incision was clean, dry and  intact with the exception of some slight erythema on the left side.  The  decision was made to start IV antibiotics.  Otherwise the patient was  ambulating well and tolerating a regular diet without complaint of nausea,  vomiting.  On postoperative day four, the patient continued to have some  tenderness at the incisional site.  Vital signs were stable.  She remained  afebrile.  Abdomen was soft.  The incision was clean, dry and intact.  Erythema continued superior to the incisional site and staples were intact.  The patient was ambulating well and was discharged home.   CONDITION ON DISCHARGE:  Stable.   DIET:  Regular as tolerated.   ACTIVITY:  No heavy lifting.  No driving x 2 weeks.  No vaginal entry.   FOLLOWUP:  The patient is to followup in the office in two days for an  incision check.  She is to call for a temperature greater than 100 degrees,  persistent nausea/vomiting, heavy vaginal bleeding, and/or increased redness  or drainage from the incisional site.   DISCHARGE MEDICATIONS:  1. Percocet 5/325, #30, one p.o. every four to six hours p.r.n. pain.  2. Motrin 600 mg every six hours p.r.n.  3. Augmentin 875 mg one p.o. b.i.d. x 7 days.  4. Prenatal vitamins one p.o. daily.  5. Colace one p.o. daily p.r.n.                                               Julio Sicks, N.P.    CC/MEDQ  D:  12/06/2003  T:  12/06/2003  Job:  [id number]

## 2011-03-19 NOTE — Discharge Summary (Signed)
[facility]  Patient:    [name], [name]                    MRN: [id number] Adm. Date:  [id number] Disc. Date: [id number] Attending:  Cordelia Pen Ii Dictator:   Danie Chandler, R.N.                           Discharge Summary  ADMITTING DIAGNOSES:          1. Intrauterine pregnancy at 38-1/[redacted] weeks                                  gestation.                               2. Herpes simplex virus prodrome.  DISCHARGE DIAGNOSES:          1. Intrauterine pregnancy at 38-1/[redacted] weeks                                  gestation.                               2. Herpes simplex virus prodrome.  PROCEDURE:                    On April 18, 2000, primary low transverse cesarean section.  REASON FOR ADMISSION:         The patient is a 40 year old married white female, gravida 3, para 0 with an estimated date of confinement of April 30, 2000 with an established diagnosis of vulvar herpes.  Cesarean section was discussed and the patient has decided to proceed with this.  HOSPITAL COURSE:              The patient was taken to the operating room and underwent the above named procedure without complications.  This was productive of a viable female infant with Apgars of 9 at one minute and 9 at five minutes and an arterial cord pH of 7.28.  Postoperatively, the patient did well.  On postoperative day #1, the patients hemoglobin was 10.5, hematocrit 32.3 and white blood cell count 11.0.  On postoperative day #2, the patient had good return of bowel function and was tolerating a regular diet. She was also ambulating well without difficulty.  She was discharged to home on postoperative day #3 with good control of pain.  CONDITION ON DISCHARGE:       Good.  DISCHARGE INSTRUCTIONS:       1. Diet regular as tolerated.                               2. activity - No heavy lifting, no driving and no                                  vaginal entry.           3. She is to follow up in the office in 1-2  weeks for an incision check.                               4. She is to call for temperature greater than                                  100 degrees, persistent nausea or vomiting,                                  heavy vaginal bleeding and/or redness or                                  drainage from the incision site.  DISCHARGE MEDICATIONS:        1. Prenatal vitamin, one p.o. q.d.                               2. Tylox, #25, 1-2 p.o. q.4-6h. p.r.n. pain. DD:  04/21/00 TD:  04/22/00 Job: [id number]

## 2011-03-19 NOTE — Op Note (Signed)
NAMEKERSTIN, [name]                       ACCOUNT NO.:  [id number]   MEDICAL RECORD NO.:  [id number]                   PATIENT TYPE:  INP   LOCATION:  9198                                 FACILITY:  [facility]   PHYSICIAN:  Freddy Finner, M.D.                DATE OF BIRTH:  1971/10/07   DATE OF PROCEDURE:  10/31/2003  DATE OF DISCHARGE:                                 OPERATIVE REPORT   PREOPERATIVE DIAGNOSIS:  Intrauterine pregnancy at term, multiparity.   POSTOPERATIVE DIAGNOSIS:  Intrauterine pregnancy at term, multiparity.   PROCEDURE:  Repeat  low transverse C-section with delivery of viable female  infant, Apgars of 9 and 9.  Secondary procedure bilateral tubal ligation and collection of umbilical  cord blood for salvage.   SURGEON:  Freddy Finner, M.D.   ANESTHESIA:  Spinal   ESTIMATED BLOOD LOSS:  600 to 800 mL.   COMPLICATIONS:  None.   INDICATIONS FOR PROCEDURE:  The patient is a 40 year old female admitted at  term for repeat cesarean delivery. She has requested bilateral tubal  ligation.   DESCRIPTION OF PROCEDURE:  She was admitted on the morning of surgery and  brought to the operating room and there placed under adequate spinal  anesthesia. She was placed in the dorsal recumbent position  with elevation  of the right hip of 15 degrees. The abdomen was prepped and draped in the  usual sterile fashion. A Foley catheter was placed using sterile technique.   A lower abdominal transverse incision was made through an old scar and  was  carried sharply through the fascia which was entered sharply and extended to  the extent of this counter incision. The rectus sheath was developed  superiorly and anteriorly with blunt and sharp dissection. The rectus  muscles were divided in the midline. The peritoneum was entered sharply and  extended bluntly to the extent of this counter incision. A bladder blade was  placed. The lower segment was safe and the bladder flap was not  dissected  off simply.   A lower transverse incision was made right through the peritoneum into the  uterine cavity. This was extended bluntly in a transverse direction. The  fluid was clear. Using the QE vacuum device and reducing a single loop of  nuchal cord, the infant was then delivered without difficulty. Apgars as  noted above. Placental cord blood was then saved for storage. Arterial cord  blood was then taken and submitted for examination.   The placenta and other products of conception were then removed from the  uterus. This was confirmed by manual expression of the uterus. The uterus  was delivered through the abdominal  incision and wrapped in a moist towel.  The patient had the tubes and uterus and ovaries were all normal.   The incision in the lower segment was then closed in a single layer with  running, locking 0 Vicryl. The  bladder had actually dissected in a small  area in the midline off the lower segment and it was reattached with a  figure-of-8 of 0 Monocryl. Hemostasis was adequate.   The isthmic portion of each tube was elevated with a Babcock and doubly  ligated with 0 plain ties with a segment of tube excised and the distal ends  of the tubes fulgurated with a Bovie. Both sides were treated identically.  The uterus was then delivered back into the abdominal cavity. Irrigation was  then carried out. Hemostasis was complete.   The abdominal incision was then closed in layers. Running 0 Monocryl was  then used to close the peritoneum and reapproximate the rectus muscles. The  fascia was closed with a running 0 PDS. The subcu was closed with running 2-  0 plain. The skin was closed with wide skin staples and 1/4 inch Steri-  Strips.   The patient tolerated the procedure well. She was taken to the recovery room  in good condition.                                               Freddy Finner, M.D.    WRN/MEDQ  D:  10/31/2003  T:  10/31/2003  Job:  [id number]

## 2011-03-19 NOTE — Op Note (Signed)
[facility]  Patient:    [name], [name]                    MRN: [id number] Proc. Date: 04/18/00 Adm. Date:  [id number] Attending:  Cordelia Pen Ii                           Operative Report  PREOPERATIVE DIAGNOSES:       1. Intrauterine pregnancy at 38-1/2 weeks.                               2. Herpes simplex virus prodrome.  POSTOPERATIVE DIAGNOSES:      1. Intrauterine pregnancy at 38-1/2 weeks.                               2. Herpes simplex virus prodrome.  PROCEDURE:                    Primary low transverse cesarean section.  SURGEON:                      Guy Sandifer. [place], M.D.  ANESTHESIA:                   Spinal.  ESTIMATED BLOOD LOSS:         800 cc.  FINDINGS:                     Viable female infant with APGAR 9 and 9 at one and five minutes respectively.  Arterial cord pH 7.28.  Birth weight pending.  INDICATIONS AND CONSENT:      The patient is a 40 year old married white female, G3, P0, AB2 with an EDC of April 30, 2000 with an established diagnosis of vulvar herpes.  Details are dictated in the history and physical.  Cesarean section was discussed.  The possible risks and complications have been discussed including (but not limited to) infection; bowel, bladder or ureteral damage; bleeding requiring transfusion of blood products with possible HIV and hepatitis or transfusion reaction; DVT; PE and pneumonia.  All questions were answered and consent has been signed on the chart.  On the day of surgery, the patient presents with complaints of contractions.  She was found to have a cervix 2-3 cm dilated.  Membranes were intact. Fetal heart tones were reactive.  She was contracting approximately every 2-4 minutes.  She was given subcutaneous terbutaline 0.25 mg x 1 to slow down her contractions while awaiting surgery.  DESCRIPTION OF PROCEDURE:     The patient was taken to the operating room, where a spinal anesthetic  was placed.  She was then placed in the dorsal supine position with a 15 degree left lateral wedge.  She was prepped and a catheter was placed and the bladder drained.  She was draped in a sterile fashion.  After testing for adequate spinal anesthesia, the skin was entered through a Pfannenstiel incision.  Dissection was carried down in layers to the peritoneum.  The peritoneum was sharply entered and extended superiorly and inferiorly.  The vesicouterine peritoneum was taken down cephalolaterally.  A bladder flap was developed and a bladder blade was placed.  The uterus was incised in a low transverse manner and the uterine cavity was entered bluntly with  a Kelly clamp.  Artificial rupture of membranes revealed clear fluid. The vertex was then delivered.  The oropharynx and nasopharynx were suctioned.  A nuchal cord x 1 was reduced.  The remainder of the infant was delivered. Good cry and tone was noted.  The cord was clamped and cut.  The infant was handed to the waiting pediatrics team.  The placenta was then manually delivered.  The uterine cavity was normal in internal contour.  The uterus was then closed in running locking fashion with 0 Monocryl suture, which achieved good hemostasis.  The tubes and ovaries were normal.  There was a single, 1 cm subserosal fibroid palpated on the uterine fundus.  The anterior peritoneum was closed in a running fashion with 0 Monocryl suture, which was also used to reapproximate the pyramidalis muscle in the midline.  The anterior rectus fascia was closed in a running fashion with 0 PDS suture and the skin was closed with clips.  All sponge, needle and instrument counts were correct. Per the patients request, the remainder of the umbilical cord blood was obtained for storage.  This was done per the instructions and will be transmitted per the laboratory.  The placenta was then sent to pathology.  All counts were correct.  The patient was transferred to  the recovery room in stable condition. DD:  04/18/00 TD:  04/20/00 Job: [id number]

## 2011-06-28 ENCOUNTER — Encounter: Payer: Self-pay | Admitting: Gynecology

## 2011-06-28 ENCOUNTER — Other Ambulatory Visit (HOSPITAL_COMMUNITY)
Admission: RE | Admit: 2011-06-28 | Discharge: 2011-06-28 | Disposition: A | Discharge status: Home / Self Care | Payer: BC Managed Care – PPO | Source: Ambulatory Visit | Attending: Gynecology | Admitting: Gynecology

## 2011-06-28 ENCOUNTER — Ambulatory Visit (INDEPENDENT_AMBULATORY_CARE_PROVIDER_SITE_OTHER): Payer: BC Managed Care – PPO | Admitting: Gynecology

## 2011-06-28 VITALS — BP 100/60 | Ht 65.5 in | Wt 117.0 lb

## 2011-06-28 DIAGNOSIS — Z131 Encounter for screening for diabetes mellitus: Secondary | ICD-10-CM

## 2011-06-28 DIAGNOSIS — N920 Excessive and frequent menstruation with regular cycle: Secondary | ICD-10-CM

## 2011-06-28 DIAGNOSIS — K589 Irritable bowel syndrome without diarrhea: Secondary | ICD-10-CM | POA: Insufficient documentation

## 2011-06-28 DIAGNOSIS — Z01419 Encounter for gynecological examination (general) (routine) without abnormal findings: Secondary | ICD-10-CM

## 2011-06-28 DIAGNOSIS — L659 Nonscarring hair loss, unspecified: Secondary | ICD-10-CM

## 2011-06-28 DIAGNOSIS — Z113 Encounter for screening for infections with a predominantly sexual mode of transmission: Secondary | ICD-10-CM

## 2011-06-28 DIAGNOSIS — K219 Gastro-esophageal reflux disease without esophagitis: Secondary | ICD-10-CM | POA: Insufficient documentation

## 2011-06-28 DIAGNOSIS — N3010 Interstitial cystitis (chronic) without hematuria: Secondary | ICD-10-CM | POA: Insufficient documentation

## 2011-06-28 DIAGNOSIS — Z13220 Encounter for screening for lipoid disorders: Secondary | ICD-10-CM

## 2011-06-28 DIAGNOSIS — F909 Attention-deficit hyperactivity disorder, unspecified type: Secondary | ICD-10-CM | POA: Insufficient documentation

## 2011-06-28 DIAGNOSIS — I491 Atrial premature depolarization: Secondary | ICD-10-CM | POA: Insufficient documentation

## 2011-06-28 LAB — HEPATITIS C ANTIBODY: HCV Ab: NEGATIVE

## 2011-06-28 LAB — RPR

## 2011-06-28 LAB — HIV ANTIBODY (ROUTINE TESTING W REFLEX): HIV: NONREACTIVE

## 2011-06-28 LAB — VITAMIN D 25 HYDROXY (VIT D DEFICIENCY, FRACTURES): Vit D, 25-Hydroxy: 30 ng/mL (ref 30–89)

## 2011-06-28 LAB — HEPATITIS B SURFACE ANTIGEN: Hepatitis B Surface Ag: NEGATIVE

## 2011-06-28 NOTE — Progress Notes (Signed)
[name] [birth year] [id number]        40 y.o.  for annual exam.  History of menorrhagia negative workup in the past with the exception of a mildly elevated FSH and we repeated it and it was normal. She continues to have heavy periods but prefers no intervention. There is some thinning of her hair right frontal area. No overt balding but just a thinning. No weight changes skin changes noted. Also asked to be screened for STDs she has no specific exposure but just wants to be checked. She was noted to be low vitamin D In the past and asked to have her vitamin D level rechecked.  Past medical history,surgical history, allergies, family history and social history were all reviewed and documented in the EPIC chart. ROS:  Was performed and pertinent positives and negatives are included in the history.  Exam: chaperone present Filed Vitals:   06/28/11 1156  BP: 100/60   General appearance  Normal Skin grossly normal Head/Neck normal with no cervical or supraclavicular adenopathy thyroid normal Lungs  clear Cardiac RR, without RMG Abdominal  soft, nontender, without masses, organomegaly or hernia Breasts  examined lying and sitting with implants.  Without masses, retractions, discharge or axillary adenopathy. Pelvic  Ext/BUS/vagina  normal    Cervix  normal  GC Chlamydia screen and Pap smear done  Uterus  anteverted, normal size, shape and contour, midline and mobile nontender   Adnexa  Without masses or tenderness    Anus and perineum  normal   Rectovaginal  normal sphincter tone without palpated masses or tenderness.    Assessment/Plan:  40 y.o. female for annual exam.    #1 Menorrhagia. Patient has history of heavier menses underwent sonohysterogram and endometrial biopsy in the past. Her FSH was mildly elevated and a repeat was normal. I went ahead and recheck her [facility] today otherwise she is not interested in any intervention but preferred just to keep a menstrual calendar. They are  using vasectomy birth control. #2 STD screening. Patient has no known exposure but asked to be screened and I ordered a GC Chlamydia hepatitis B hepatitis C HIV and RPR. Patient will followup with these results. #3 Alopecia. No overt evidence of alopecia or balding on exam. We'll check baseline TSH patient will monitor at present that she still feels is getting worse and she'll return to the dermatologist for evaluation. #4 Left axillary nodularity on self exam. Patient notes in her left axilla she feels some nodularity but she has felt over the past several years. She underwent evaluation earlier this year with an ultrasound and this area was examined by the radiologist both of which were negative. My exam today as previously is negative. I've asked her to continue self breast exams and axillary exams as long as there is no change to her self-exam and we'll follow and she will continue with annual mammography. #5 Health maintenance. CBC, glucose, lipid profile, vitamin D and urinalysis were ordered. She'll followup for her results and otherwise assuming she continues well she'll see [place] in a year sooner as needed    Dara Lords MD, 12:48 PM 06/28/2011

## 2011-06-29 ENCOUNTER — Telehealth: Payer: Self-pay | Admitting: *Deleted

## 2011-06-29 NOTE — Telephone Encounter (Signed)
Lm for pt to call regarding the below note.

## 2011-06-29 NOTE — Telephone Encounter (Signed)
Pt informed with the below lab results.

## 2011-06-29 NOTE — Telephone Encounter (Signed)
Message copied by Aura Camps on Tue Jun 29, 2011  2:24 PM ------      Message from: Bertram Savin A      Created: Tue Jun 29, 2011  2:20 PM       PER KARI PATIENT SPEAKS ENGLISH      ----- Message -----         From: Erik Obey         Sent: 06/29/2011   9:34 AM           To: Bertram Savin, CMA            BLANCA PLEASE CALL PATIENT AND TELL BELOW.      ----- Message -----         From: Dara Lords, MD         Sent: 06/29/2011   8:51 AM           To: Erik Obey            Help the patient blood STD check is negative, lipid profile glucose CBC looked good, vitamin D marginal at 30 recommended 2000 units daily of vitamin D. I will let her know if the STD cultures or Pap smear are abnormal.

## 2011-07-02 ENCOUNTER — Encounter: Payer: Self-pay | Admitting: Family Medicine

## 2011-07-02 ENCOUNTER — Ambulatory Visit (INDEPENDENT_AMBULATORY_CARE_PROVIDER_SITE_OTHER): Payer: BC Managed Care – PPO | Admitting: Family Medicine

## 2011-07-02 DIAGNOSIS — Z889 Allergy status to unspecified drugs, medicaments and biological substances status: Secondary | ICD-10-CM

## 2011-07-02 DIAGNOSIS — I491 Atrial premature depolarization: Secondary | ICD-10-CM

## 2011-07-02 DIAGNOSIS — K589 Irritable bowel syndrome without diarrhea: Secondary | ICD-10-CM

## 2011-07-02 DIAGNOSIS — R41840 Attention and concentration deficit: Secondary | ICD-10-CM

## 2011-07-02 DIAGNOSIS — Z9109 Other allergy status, other than to drugs and biological substances: Secondary | ICD-10-CM

## 2011-07-02 DIAGNOSIS — N3010 Interstitial cystitis (chronic) without hematuria: Secondary | ICD-10-CM

## 2011-07-02 DIAGNOSIS — E559 Vitamin D deficiency, unspecified: Secondary | ICD-10-CM | POA: Insufficient documentation

## 2011-07-02 DIAGNOSIS — R413 Other amnesia: Secondary | ICD-10-CM

## 2011-07-02 DIAGNOSIS — K219 Gastro-esophageal reflux disease without esophagitis: Secondary | ICD-10-CM

## 2011-07-02 HISTORY — DX: Attention and concentration deficit: R41.840

## 2011-07-02 HISTORY — DX: Allergy status to unspecified drugs, medicaments and biological substances: Z88.9

## 2011-07-02 MED ORDER — CALCIUM CITRATE-VITAMIN D 250-200 MG-UNIT PO TABS: 1.0000 | ORAL_TABLET | Freq: Every day | ORAL | Status: DC

## 2011-07-02 MED ORDER — METHYLPHENIDATE HCL ER (CD) 30 MG PO CPCR: 30.0000 mg | ORAL_CAPSULE | ORAL | Status: DC

## 2011-07-02 MED ORDER — VITAMIN D 50 MCG (2000 UT) PO CAPS: 1.0000 | ORAL_CAPSULE | Freq: Once | ORAL | Status: DC

## 2011-07-02 NOTE — Progress Notes (Signed)
[name] [id number] 06-Jan-1971 07/02/2011      Progress Note New Patient  Subjective  Chief Complaint  Chief Complaint  Patient presents with  . Establish Care    no problems    HPI  Patient is a 40 yo Caucasian female in today for new patient appt. She has a passed medical history of Interstitial cystitis, Irritable Bowel Syndrome, reflux and allergies. Historically she has struggled with RLQ pain/ovarian pain. Most of her disease states are well treated at this time and she does see multiple specialists. She has a history of PACs as well but they are only minimally symptomatic and do not occur in a sustained fashion. No CP/SOB/GU c/o at present. No recent fevers, chills, congestion.She has recently undergone a screen performed a psychologist lately which tested her positive for add. She was started on Metadate CD it has been titrated to 30mg  and she feels it is helping her concentration. No HA or abdominal pain recently  Past Medical History  Diagnosis Date  . GERD (gastroesophageal reflux disease)   . IBS (irritable bowel syndrome)   . Interstitial cystitis   . Premature atrial contractions 2012  . Adult ADHD     SYMPTOMS ONLY  . Vitamin D deficiency 07/02/2011  . Poor concentration 07/02/2011    Past Surgical History  Procedure Date  . Tonsillectomy 1982  . Rhinoplasty 2003  . Breast enhancement surgery 2007  . Cesarean section 2001 and 2004  . Laparoscopy     twice    Family History  Problem Relation Age of Onset  . Heart disease Mother     MVP  . Anxiety disorder Mother   . Hypertension Brother   . Diabetes Brother     PRE DIABETES  . Obesity Brother   . Allergies Daughter   . Asthma Daughter   . Allergies Son   . Asthma Son   . Kidney disease Maternal Grandmother     uremia    History   Social History  . Marital Status: Married    Spouse Name: N/A    Number of Children: N/A  . Years of Education: N/A   Occupational History  . Not on file.    Social History Main Topics  . Smoking status: Never Smoker   . Smokeless tobacco: Never Used  . Alcohol Use: No     RARELY  . Drug Use: No  . Sexually Active: Yes    Birth Control/ Protection: Surgical   Other Topics Concern  . Not on file   Social History Narrative  . No narrative on file    Current Outpatient Prescriptions on File Prior to Visit  Medication Sig Dispense Refill  . aspirin 81 MG tablet Take 81 mg by mouth daily.        . Cholecalciferol (VITAMIN D PO) Take by mouth.        . fexofenadine (ALLEGRA) 180 MG tablet Take 180 mg by mouth daily.        . fish oil-omega-3 fatty acids 1000 MG capsule Take 2 g by mouth daily.       . fluticasone (FLONASE) 50 MCG/ACT nasal spray Place 2 sprays into the nose daily.        Marland Kitchen glucosamine-chondroitin 500-400 MG tablet Take 1 tablet by mouth.        . Meth-Hyo-M Bl-Na Phos-Ph Sal (URIBEL PO) Take by mouth.        . Multiple Vitamin (MULTIVITAMIN) capsule Take 1 capsule by mouth daily.        Marland Kitchen  pentosan polysulfate (ELMIRON) 100 MG capsule Take 100 mg by mouth 3 (three) times daily before meals.        . ValACYclovir HCl (VALTREX PO) Take by mouth as needed.          No Known Allergies  Review of Systems  Review of Systems  Constitutional: Negative for fever, chills and malaise/fatigue.  HENT: Negative for hearing loss, nosebleeds and congestion.   Eyes: Negative for discharge.  Respiratory: Negative for cough, sputum production, shortness of breath and wheezing.   Cardiovascular: Negative for chest pain, palpitations and leg swelling.  Gastrointestinal: Negative for heartburn, nausea, vomiting, abdominal pain, diarrhea, constipation and blood in stool.  Genitourinary: Negative for dysuria, urgency, frequency and hematuria.  Musculoskeletal: Negative for myalgias, back pain and falls.  Skin: Negative for rash.  Neurological: Negative for dizziness, tremors, sensory change, focal weakness, loss of consciousness,  weakness and headaches.  Endo/Heme/Allergies: Negative for polydipsia. Does not bruise/bleed easily.  Psychiatric/Behavioral: Negative for depression and suicidal ideas. The patient is not nervous/anxious and does not have insomnia.     Objective  BP 96/57  Pulse 73  Temp(Src) 98.4 F (36.9 C) (Oral)  Ht 5' 5.5" (1.664 m)  Wt 118 lb (53.524 kg)  BMI 19.34 kg/m2  SpO2 97%  LMP 06/15/2011  Physical Exam  Physical Exam  Constitutional: She is oriented to person, place, and time and well-developed, well-nourished, and in no distress. No distress.  HENT:  Head: Normocephalic and atraumatic.  Right Ear: External ear normal.  Left Ear: External ear normal.  Nose: Nose normal.  Mouth/Throat: Oropharynx is clear and moist. No oropharyngeal exudate.  Eyes: Conjunctivae are normal. Pupils are equal, round, and reactive to light. Right eye exhibits no discharge. Left eye exhibits no discharge. No scleral icterus.  Neck: Normal range of motion. Neck supple. No thyromegaly present.  Cardiovascular: Normal rate, regular rhythm, normal heart sounds and intact distal pulses.   No murmur heard. Pulmonary/Chest: Effort normal and breath sounds normal. No respiratory distress. She has no wheezes. She has no rales.  Abdominal: Soft. Bowel sounds are normal. She exhibits no distension and no mass. There is no tenderness.  Musculoskeletal: Normal range of motion. She exhibits no edema and no tenderness.  Lymphadenopathy:    She has no cervical adenopathy.  Neurological: She is alert and oriented to person, place, and time. She has normal reflexes. No cranial nerve deficit. Coordination normal.  Skin: Skin is warm and dry. No rash noted. She is not diaphoretic.  Psychiatric: Mood, memory and affect normal.       Assessment & Plan  Interstitial cystitis Symptoms recently flared but have now improved again. Is doing well on Uribel and Elmiron and by avoiding acidic foods, maintain adequate  hydration  IBS (irritable bowel syndrome) Reports these symptoms are well controlled at present. Was taking Omeprazole but no longer feels she needs it. No dyspepsia at present  GERD (gastroesophageal reflux disease) Improved over past year. Avoid offending foods and will treat as needed.   Vitamin D deficiency Recent labs showed a vitamin D level of 30 which is low normal. She has been started on Vitamin D 2000iu qd and asked to start Citracal daily, reassess vitamin d level in 3-4 months.  Premature atrial contractions Infrequent and not strung together, has previously had this worked up by cardiology, will request records.  Poor concentration Has been treated with Metadate CD 30mg  daily and she definitely feels it helps her to focus. She is given  a refill today and we will reassess and consider further testing as indicated in the future.

## 2011-07-02 NOTE — Assessment & Plan Note (Signed)
Infrequent and not strung together, has previously had this worked up by cardiology, will request records.

## 2011-07-02 NOTE — Assessment & Plan Note (Signed)
Reports these symptoms are well controlled at present. Was taking Omeprazole but no longer feels she needs it. No dyspepsia at present

## 2011-07-02 NOTE — Assessment & Plan Note (Signed)
Symptoms recently flared but have now improved again. Is doing well on Uribel and Elmiron and by avoiding acidic foods, maintain adequate hydration

## 2011-07-02 NOTE — Assessment & Plan Note (Signed)
Recent labs showed a vitamin D level of 30 which is low normal. She has been started on Vitamin D 2000iu qd and asked to start Citracal daily, reassess vitamin d level in 3-4 months.

## 2011-07-02 NOTE — Patient Instructions (Addendum)
Preventative Care for Adults - Female Studies show that half of deaths in the [place] today result from unhealthy lifestyle practices. This includes ignoring preventive care suggestions. Preventive health guidelines for women include the following key practices:  A routine yearly physical is a good way to check with your primary caregiver about your health and preventive screening. It is a chance to share any concerns and updates on your health, and to receive a thorough all-over exam.   If you smoke cigarettes, find out from your caregiver how to quit. It can literally save your life, no matter how long you have been a tobacco user. If you do not use tobacco, never start.   Maintain a healthy diet and normal weight. Increased weight leads to problems with blood pressure and diabetes. Decrease saturated fat in your diet and increase regular exercise. Eat a variety of foods, including fruit, vegetables, animal or vegetable protein (meat, fish, chicken, and eggs, or beans, lentils, and tofu), and grains, such as rice. Get information about proper diet from your caregiver, if needed.   Aerobic exercise helps maintain good heart health. The CDC and the American College of Sports Medicine recommend 30 minutes of moderate-intensity exercise (a brisk walk that increases your heart rate and breathing) on most days of the week. Ongoing high blood pressure should be treated with medicines, if weight loss and exercise are not effective.   Avoid smoking, drinking too much alcohol (more than two drinks per day), and use of street drugs. Do not share needles with anyone. Ask for professional help if you need support or instructions about stopping the use of alcohol, cigarettes, or drugs.   Maintain normal blood lipids and cholesterol, by minimizing your intake of saturated fat. Eat a well rounded diet, with plenty of fruit and vegetables. The National Institutes of Health encourage women to eat 5-9 servings of  fruit and vegetables each day. Your caregiver can give instructions to help you keep your risk of heart disease or stroke low. High blood pressure causes heart disease and increases risk of stroke. Blood pressure should be checked every 1-2 years, from age 20 onward.   Blood tests for high cholesterol, which causes heart and vessel disease, should begin at age 20 and be repeated every 5 years, if test results are normal. (Repeat tests more often if results are high.)   Diabetes screening involves taking a blood sample to check your blood sugar level, after a fasting period. This is done once every 3 years, after age 45, if test results are normal.   Breast cancer screening is essential to preventive care for women. All women age 20 and older should perform a breast self-exam every month. At age 40 and older, women should have their caregiver complete a breast exam each year. Women at ages 40-50 should have a mammogram (x-ray film) of the breasts each year. Your caregiver can discuss when to start your yearly mammograms.   Cervical cancer screening includes taking a Pap smear (sample of cells examined under a microscope) from the cervix (end of the uterus). It also includes testing for HPV (Human Papilloma Virus, which can cause cervical cancer). Screening and a pelvic exam should begin at age 21, or 3 years after a woman becomes sexually active. Screening should occur every year, with a Pap smear but no HPV testing, up to age 30. After age 30, you should have a Pap smear every 3 years with HPV testing, if no HPV was found previously.     Colon cancer can be detected, and often prevented, long before it is life threatening. Most routine colon cancer screening begins at the age of 50. On a yearly basis, your caregiver may provide easy-to-use take-home tests to check for hidden blood in the stool. Use of a small camera at the end of a tube, to directly examine the colon (sigmoidoscopy or colonoscopy), can  detect the earliest forms of colon cancer and can be life saving. Talk to your caregiver about this at age 50, when routine screening begins. (Screening is repeated every 5 years, unless early forms of pre-cancerous polyps or small growths are found.)   Practice safe sex. Use condoms. Condoms are used for birth control and to reduce the spread of sexually transmitted infections (STIs). Unsafe sex is sexual activity without the use of safeguards, such as condoms and avoidance of high-risk acts, to reduce the chances of getting or spreading STIs. STIs include gonorrhea (the clap), chlamydia, syphilis, trichimonas, herpes, HPV (human papilloma virus) and HIV (human immunodeficiency virus), which causes AIDS. Herpes, HIV, and HPV are viral illnesses that have no cure. They can result in disability, cancer, and death.   HPV causes cancer of the cervix, and other infections that can be transmitted from person to person. There is a vaccine for HPV, and females should get immunized between the ages of 11 and 26. It requires a series of 3 shots.   Osteoporosis is a disease in which the bones lose minerals and strength as we age. This can result in serious bone fractures. Risk of osteoporosis can be identified using a bone density scan. Women ages 65 and over should discuss this with their caregivers, as should women after menopause who have other risk factors. Ask your caregiver whether you should be taking a calcium supplement and Vitamin D, to reduce the rate of osteoporosis.   Menopause can be associated with physical symptoms and risks. Hormone replacement therapy is available to decrease these. You should talk to your caregiver about whether starting or continuing to take hormones is right for you.   Use sunscreen with SPF (skin protection factor) of 15 or more. Apply sunscreen liberally and repeatedly throughout the day. Being outside in the sun, when your shadow is shorter than you are, means you are being  exposed to sun at greater intensity. Lighter skinned people are at a greater risk of skin cancer. Wear sunglasses, to protect your eyes from too much damaging sunlight (which can speed up cataract formation).   Once a month, do a whole body skin exam or review, using a mirror to look at your back. Notify your caregiver of changes in moles, especially if there are changes in shapes, colors, irregular border, a size larger than a pencil eraser, or new moles develop.   Keep carbon monoxide and smoke detectors in your home, and functioning, at all times. Change the batteries every 6 months, or use a model that plugs into the wall.   Stay up to date with your tetanus shots and other required immunizations. A booster for tetanus should be given every 10 years. Be sure to get your flu shot every year, since 5%-20% of the [place] population comes down with the flu. The composition of the flu vaccine changes each year, so being vaccinated once is not enough. Get your shot in the fall, before the flu season peaks. The table below lists important vaccines to get. Other vaccines to consider include for Hepatitis A virus (to prevent a form of   infection of the liver, by a virus acquired from food), Varicella Zoster (a virus that causes shingles), and Meninogoccal (against bacteria which cause a form of meningitis).   Brush your teeth twice a day with fluoride toothpaste, and floss once a day. Good oral hygiene prevents tooth decay and gum disease, which can be painful and can cause other health problems. Visit your dentist for a routine oral and dental check up and preventive care every 6-12 months.   The Body Mass Index or BMI is a way of measuring how much of your body is fat. Having a BMI above 27 increases the risk of heart disease, diabetes, hypertension, stroke and other problems related to obesity. Your caregiver can help determine your BMI, and can develop an exercise and dietary program to help you achieve or  maintain this measurement at a healthy level.   Wear seat belts whenever you are in a vehicle, whether as passenger or driver, and even for short drives of a few minutes.   If you bicycle, wear a helmet at all times.  Preventative Care for Adult Women  Preventative Services Ages 71-39 Ages 65-64 Ages 65 and over  Health risk assessment and lifestyle counseling.     Blood pressure check.** Every 1-2 years Every 1-2 years Every 1-2 years  Total cholesterol check including HDL.** Every 5 years beginning at age 68 Every 5 years beginning at age 61, or more often if risk is high Every 5 years through age 41, then optional  Breast self exam. Monthly in all women ages 42 and older Monthly Monthly  Clinical breast exam.** Every 3 years beginning at age 57 Every year Every year  Mammogram.**  Every year beginning at age 72, optional from age 1-49 (discuss with your caregiver). Every year until age 29, then optional  Pap Smear** and HPV Screening. Every year from ages 72 through 22 Every 3 years from ages 10 through 59, if HPV is negative Optional; talk with your caregiver  Flexible sigmoidoscopy** or colonoscopy.**   Every 5 years beginning at age 61 Every 5 years until age 13; then optional  FOBT (fecal occult blood test) of stool.  Every year beginning at age 70 Every year until 57; then optional  Skin self-exam. Monthly Monthly Monthly  Tetanus-diphtheria (Td) immunization. Every 10 years Every 10 years Every 10 years  Influenza immunization.** Every year Every year Every year  HPV immunization. Once between the ages of 69 and 64     Pneumococcal immunization.** Optional Optional Every 5 years  Hepatitis B immunization.** Series of 3 immunizations  (if not done previously, usually given at 0, 1 to 2, and 4 to 6 months)  Check with your caregiver, if vaccination not previously given Check with your caregiver, if vaccination not previously given  ** Family history and personal history of risk and  conditions may change your caregiver's recommendations.  Document Released: 12/14/2001 Document Re-Released: 01/12/2010 [facility] Patient Information 2011 [place], [place].  Consider MVI every other day, fish oil or flaxseed oil daily

## 2011-07-02 NOTE — Assessment & Plan Note (Signed)
Has been treated with Metadate CD 30mg  daily and she definitely feels it helps her to focus. She is given a refill today and we will reassess and consider further testing as indicated in the future.

## 2011-07-02 NOTE — Assessment & Plan Note (Signed)
Improved over past year. Avoid offending foods and will treat as needed.

## 2011-07-28 ENCOUNTER — Encounter: Payer: Self-pay | Admitting: Family Medicine

## 2011-07-28 ENCOUNTER — Ambulatory Visit (INDEPENDENT_AMBULATORY_CARE_PROVIDER_SITE_OTHER): Payer: BC Managed Care – PPO | Admitting: Family Medicine

## 2011-07-28 VITALS — BP 108/67 | HR 72 | Temp 98.1°F | Ht 65.5 in | Wt 114.8 lb

## 2011-07-28 DIAGNOSIS — F411 Generalized anxiety disorder: Secondary | ICD-10-CM

## 2011-07-28 DIAGNOSIS — R413 Other amnesia: Secondary | ICD-10-CM

## 2011-07-28 DIAGNOSIS — R5383 Other fatigue: Secondary | ICD-10-CM

## 2011-07-28 DIAGNOSIS — L659 Nonscarring hair loss, unspecified: Secondary | ICD-10-CM

## 2011-07-28 DIAGNOSIS — R5381 Other malaise: Secondary | ICD-10-CM

## 2011-07-28 DIAGNOSIS — IMO0001 Reserved for inherently not codable concepts without codable children: Secondary | ICD-10-CM

## 2011-07-28 DIAGNOSIS — F909 Attention-deficit hyperactivity disorder, unspecified type: Secondary | ICD-10-CM

## 2011-07-28 DIAGNOSIS — D649 Anemia, unspecified: Secondary | ICD-10-CM

## 2011-07-28 DIAGNOSIS — F419 Anxiety disorder, unspecified: Secondary | ICD-10-CM

## 2011-07-28 DIAGNOSIS — R41840 Attention and concentration deficit: Secondary | ICD-10-CM

## 2011-07-28 DIAGNOSIS — Z23 Encounter for immunization: Secondary | ICD-10-CM

## 2011-07-28 DIAGNOSIS — K219 Gastro-esophageal reflux disease without esophagitis: Secondary | ICD-10-CM

## 2011-07-28 LAB — FERRITIN: Ferritin: 16.2 ng/mL (ref 10.0–291.0)

## 2011-07-28 MED ORDER — LORAZEPAM 0.5 MG PO TABS: 0.5000 mg | ORAL_TABLET | Freq: Three times a day (TID) | ORAL | Status: DC | PRN

## 2011-07-28 MED ORDER — METHYLPHENIDATE HCL ER (CD) 30 MG PO CPCR: 30.0000 mg | ORAL_CAPSULE | ORAL | Status: DC

## 2011-07-28 MED ORDER — METHYLPHENIDATE HCL 10 MG PO TABS: 10.0000 mg | ORAL_TABLET | Freq: Every day | ORAL | Status: DC

## 2011-07-28 MED ORDER — RANITIDINE HCL 150 MG PO TABS: 150.0000 mg | ORAL_TABLET | Freq: Two times a day (BID) | ORAL | Status: DC | PRN

## 2011-07-28 NOTE — Patient Instructions (Addendum)
Heartburn Heartburn is a painful, burning sensation in the chest. It may feel worse in certain positions, such as lying down or bending over. It is caused by stomach acid backing up into the tube that carries food from the mouth down to the stomach (lower esophagus).  CAUSES A number of conditions can cause or worsen heartburn, including:   Pregnancy.   Being overweight (obesity).   A condition called hiatal hernia, in which part or all of the stomach is moved up into the chest through a weakness in the diaphragm muscle.   Alcohol.   Exercise.   Eating just before going to bed.   Overeating.   Medications, including:   Nonsteroidal anti-inflammatory drugs, such as ibuprofen and naproxen.   Aspirin.   Some blood pressure medicines, including beta-blockers, calcium channel blockers, and alpha-blockers.   Nitrates (used to treat angina).   The asthma medication Theophylline.   Certain sedative drugs.   Heartburn may be worse after eating certain foods. These heartburn-causing foods are different for different people, but may include:   Peppers.   Chocolate.   Coffee.   High-fat foods, including fried foods.   Spicy foods.   Garlic, onions.   Citrus fruits, including oranges, grapefruit, lemons and limes.   Food containing tomatoes or tomato products.   Mint.   Carbonated beverages.   Vinegar.  SYMPTOMS  Symptoms may last for a few minutes or a few hours, and can include:  Burning pain in the chest or lower throat.   Bitter taste in the mouth.   Coughing.  DIAGNOSIS If the usual treatments for heartburn do not improve your symptoms, then tests may be done to see if there is another condition present. Possible tests may include:  X-rays.   Endoscopy. This is when a tube with a light and a camera on the end is used to examine the esophagus and the stomach.   Blood, breath, or stool tests may be used to check for bacteria that cause ulcers.   TREATMENT There are a number of non-prescription medicines used to treat heartburn, including:  Antacids.   Acid reducers (also called H-2 blockers).   Proton-pump inhibitors.  HOME CARE INSTRUCTIONS  Raise the head of your bed by putting blocks under the legs.   Eat 2-3 hours before going to bed.   Stop smoking.   Try to reach and maintain a healthy weight.   Do not eat just a few very large meals. Instead, eat many smaller meals throughout the day.   Try to identify foods and beverages that make your symptoms worse, and avoid these.   Avoid tight clothing.   Do not exercise right after eating.  SEEK IMMEDIATE MEDICAL CARE IF YOU:  Have severe chest pain that goes down your arm, or into your jaw or neck.   Feel sweaty, dizzy, or lightheaded.   Are short of breath.   Throw up (vomit) blood.   Have difficulty or pain with swallowing.   Have bloody or black, tarry stools.   Have bouts of heartburn more than three times a week for more than two weeks.  Document Released: 03/06/2009 Document Re-Released: 01/12/2010 [facility] Patient Information 2011 [place], [place].  Avoid offending foods, use Mylanta or Tums as needed for any burning   Maintain a diet full of whole grains, fresh foods and vegetables , lean proteins and healthy plant based fats, consider a 3-4 month trial of Gluten free diet  Udi breads are good  Ranitidine 150  mg can take 1 tab daily, or 1 tab twice a day or 2 tabs at once once a day

## 2011-07-29 LAB — POCT URINALYSIS DIP (DEVICE)
Bilirubin Urine: NEGATIVE
Glucose, UA: NEGATIVE
Nitrite: NEGATIVE
Urobilinogen, UA: 0.2
pH: 5.5

## 2011-07-29 LAB — HOMOCYSTEINE: Homocysteine: 8 umol/L (ref 4.0–15.4)

## 2011-07-29 LAB — CBC
HCT: 42 % (ref 36.0–46.0)
Platelets: 179 10*3/uL (ref 150–400)
RBC: 4.75 MIL/uL (ref 3.87–5.11)
RDW: 12.4 % (ref 11.5–15.5)
WBC: 3.9 10*3/uL — ABNORMAL LOW (ref 4.0–10.5)

## 2011-07-30 LAB — INTRINSIC FACTOR ANTIBODIES: Intrinsic Factor: NEGATIVE

## 2011-07-31 ENCOUNTER — Encounter: Payer: Self-pay | Admitting: Family Medicine

## 2011-07-31 DIAGNOSIS — F419 Anxiety disorder, unspecified: Secondary | ICD-10-CM | POA: Insufficient documentation

## 2011-07-31 DIAGNOSIS — F32A Depression, unspecified: Secondary | ICD-10-CM

## 2011-07-31 HISTORY — DX: Anxiety disorder, unspecified: F41.9

## 2011-07-31 HISTORY — DX: Depression, unspecified: F32.A

## 2011-07-31 NOTE — Assessment & Plan Note (Addendum)
Patient has been maintained on Metadate daily but tends to feel it wears off in the pm. We will allow her a low dose of Ritalin to use in the afternoons prn.

## 2011-07-31 NOTE — Progress Notes (Signed)
[name] [id number] February 27, 1971 07/31/2011      Progress Note-Follow Up  Subjective  Chief Complaint  Chief Complaint  Patient presents with  . Follow-up    3-4 week follow up    HPI  Patient is in today for follow up on her new patient appt. She has not had any acute illness, fevers, chills, HA, CP, palp, SOB. Her cystitis is well controlled. She is still struggling with hair loss. She has been started on iron by a dermatologist when they found her iron was low. She has an appt with another hair loss specialist next month. She is experiencing some indigesiton and even burning into the throat at times. She is still using the Metadate iin the am and finds it helpful but it tends to wear off in the afternoon and she feels very tired and has trouble concentrating  Past Medical History  Diagnosis Date  . GERD (gastroesophageal reflux disease)   . IBS (irritable bowel syndrome)   . Interstitial cystitis   . Premature atrial contractions 2012  . Adult ADHD     SYMPTOMS ONLY  . Vitamin D deficiency 07/02/2011  . Poor concentration 07/02/2011  . Multiple allergies 07/02/2011  . Anxiety 07/31/2011    Past Surgical History  Procedure Date  . Tonsillectomy 1982  . Rhinoplasty 2003  . Breast enhancement surgery 2007  . Cesarean section 2001 and 2004  . Laparoscopy     twice    Family History  Problem Relation Age of Onset  . Heart disease Mother     MVP  . Anxiety disorder Mother   . Hypertension Brother   . Diabetes Brother     PRE DIABETES  . Obesity Brother   . Allergies Daughter   . Asthma Daughter   . Allergies Son   . Asthma Son   . Kidney disease Maternal Grandmother     uremia    History   Social History  . Marital Status: Married    Spouse Name: N/A    Number of Children: N/A  . Years of Education: N/A   Occupational History  . Not on file.   Social History Main Topics  . Smoking status: Never Smoker   . Smokeless tobacco: Never Used  . Alcohol  Use: No     RARELY  . Drug Use: No  . Sexually Active: Yes    Birth Control/ Protection: Surgical   Other Topics Concern  . Not on file   Social History Narrative  . No narrative on file    Current Outpatient Prescriptions on File Prior to Visit  Medication Sig Dispense Refill  . aspirin 81 MG tablet Take 81 mg by mouth daily.        . Calcium Citrate-Vitamin D 250-200 MG-UNIT TABS Take 1 tablet by mouth daily.  30 each  11  . Cholecalciferol (VITAMIN D PO) Take by mouth.        . Cholecalciferol (VITAMIN D) 2000 UNITS CAPS Take 1 capsule (2,000 Units total) by mouth once.  30 capsule  11  . fexofenadine (ALLEGRA) 180 MG tablet Take 180 mg by mouth daily.        . fish oil-omega-3 fatty acids 1000 MG capsule Take 2 g by mouth daily.       . fluticasone (FLONASE) 50 MCG/ACT nasal spray Place 2 sprays into the nose daily.        Marland Kitchen glucosamine-chondroitin 500-400 MG tablet Take 1 tablet by mouth.        Marland Kitchen  hydrOXYzine (ATARAX) 10 MG tablet Take 10 mg by mouth as needed.        . Meth-Hyo-M Bl-Na Phos-Ph Sal (URIBEL PO) Take by mouth.        . Multiple Vitamin (MULTIVITAMIN) capsule Take 1 capsule by mouth daily.        . pentosan polysulfate (ELMIRON) 100 MG capsule Take 100 mg by mouth 3 (three) times daily before meals.        . ValACYclovir HCl (VALTREX PO) Take by mouth as needed.          No Known Allergies  Review of Systems  Review of Systems  Constitutional: Negative for fever and malaise/fatigue.  HENT: Negative for congestion.   Eyes: Negative for discharge.  Respiratory: Negative for shortness of breath.   Cardiovascular: Negative for chest pain, palpitations and leg swelling.  Gastrointestinal: Positive for heartburn. Negative for nausea, abdominal pain and diarrhea.  Genitourinary: Negative for dysuria.  Musculoskeletal: Negative for falls.  Skin: Negative for itching and rash.  Neurological: Negative for loss of consciousness and headaches.  Endo/Heme/Allergies:  Negative for polydipsia.  Psychiatric/Behavioral: Negative for depression and suicidal ideas. The patient is nervous/anxious. The patient does not have insomnia.     Objective  BP 108/67  Pulse 72  Temp(Src) 98.1 F (36.7 C) (Oral)  Ht 5' 5.5" (1.664 m)  Wt 114 lb 12.8 oz (52.073 kg)  BMI 18.81 kg/m2  SpO2 99%  LMP 07/16/2011  Physical Exam  Physical Exam  Constitutional: She is oriented to person, place, and time and well-developed, well-nourished, and in no distress. No distress.  HENT:  Head: Normocephalic and atraumatic.  Eyes: Conjunctivae are normal.  Neck: Neck supple. No thyromegaly present.  Cardiovascular: Normal rate, regular rhythm and normal heart sounds.   No murmur heard. Pulmonary/Chest: Effort normal and breath sounds normal. She has no wheezes.  Abdominal: She exhibits no distension and no mass.  Musculoskeletal: She exhibits no edema.  Lymphadenopathy:    She has no cervical adenopathy.  Neurological: She is alert and oriented to person, place, and time.  Skin: Skin is warm and dry. No rash noted. She is not diaphoretic.  Psychiatric: Memory, affect and judgment normal.    Lab Results  Component Value Date   TSH 1.682 12/02/2009   Lab Results  Component Value Date   WBC 3.9* 07/28/2011   HGB 13.5 07/28/2011   HCT 42.0 07/28/2011   MCV 88.4 07/28/2011   PLT 179 07/28/2011   Lab Results  Component Value Date   CREATININE 0.59 05/29/2009   BUN 12 05/29/2009   NA 142 05/29/2009   K 4.5 05/29/2009   CL 107 05/29/2009   CO2 27 05/29/2009   Lab Results  Component Value Date   ALT 11 05/29/2009   AST 11 05/29/2009   ALKPHOS 31* 05/29/2009   BILITOT 1.0 05/29/2009   Lab Results  Component Value Date   CHOL 154 05/29/2009   Lab Results  Component Value Date   HDL 48 05/29/2009   Lab Results  Component Value Date   LDLCALC 97 05/29/2009   Lab Results  Component Value Date   TRIG 46 05/29/2009   Lab Results  Component Value Date   CHOLHDL 3.2 Ratio  05/29/2009     Assessment & Plan  Adult ADHD Patient has been maintained on Metadate daily but tends to feel it wears off in the pm. We will allow her a low dose of Ritalin to use in the afternoons prn.  GERD (gastroesophageal reflux disease) Avoid offending foods and may use Ranitidine prn.  Anxiety Patient is given a small amount of Lorazepam to use prn.

## 2011-07-31 NOTE — Assessment & Plan Note (Signed)
Avoid offending foods and may use Ranitidine prn.

## 2011-07-31 NOTE — Assessment & Plan Note (Signed)
Patient is given a small amount of Lorazepam to use prn.

## 2011-08-03 ENCOUNTER — Encounter: Payer: Self-pay | Admitting: Family Medicine

## 2011-08-04 LAB — POCT URINALYSIS DIP (DEVICE)
Nitrite: POSITIVE — AB
Nitrite: NEGATIVE
Operator id: 200901
Protein, ur: 30 — AB
Protein, ur: NEGATIVE
Urobilinogen, UA: 0.2
pH: 7

## 2011-08-11 LAB — POCT HEMOGLOBIN-HEMACUE: Operator id: 268271

## 2011-08-27 ENCOUNTER — Encounter: Payer: Self-pay | Admitting: Family Medicine

## 2011-08-27 ENCOUNTER — Ambulatory Visit (INDEPENDENT_AMBULATORY_CARE_PROVIDER_SITE_OTHER): Payer: BC Managed Care – PPO | Admitting: Family Medicine

## 2011-08-27 DIAGNOSIS — R319 Hematuria, unspecified: Secondary | ICD-10-CM

## 2011-08-27 DIAGNOSIS — N390 Urinary tract infection, site not specified: Secondary | ICD-10-CM

## 2011-08-27 DIAGNOSIS — B379 Candidiasis, unspecified: Secondary | ICD-10-CM

## 2011-08-27 MED ORDER — FLUCONAZOLE 150 MG PO TABS: ORAL_TABLET | ORAL | Status: DC

## 2011-08-27 MED ORDER — CIPROFLOXACIN HCL 500 MG PO TABS: ORAL_TABLET | ORAL | Status: DC

## 2011-08-27 MED ORDER — HYDROCODONE-ACETAMINOPHEN 5-325 MG PO TABS: 1.0000 | ORAL_TABLET | ORAL | Status: AC | PRN

## 2011-08-27 NOTE — Patient Instructions (Signed)

## 2011-08-28 ENCOUNTER — Encounter: Payer: Self-pay | Admitting: Family Medicine

## 2011-08-28 DIAGNOSIS — N390 Urinary tract infection, site not specified: Secondary | ICD-10-CM

## 2011-08-28 HISTORY — DX: Urinary tract infection, site not specified: N39.0

## 2011-08-28 NOTE — Assessment & Plan Note (Signed)
Symptoms developed over night, burning/frequency and urgency have developed. Urine will be sent for culture and she is started on ciprofloxacin 500mg  po bid and she is allowed some Hydrocodone for pain control. Increase hydration.

## 2011-08-28 NOTE — Progress Notes (Signed)
[name] [id number] 06-Sep-1971 08/28/2011      Progress Note-Follow Up  Subjective  Chief Complaint  Chief Complaint  Patient presents with  . Urinary Tract Infection    pain, burning, urgency--began last night    HPI  Patient is a 40 year old Caucasian female with a long history of interstitial cystitis. She had acute urinary symptoms started overnight. She is struggling with dysuria, frequency, urgency and some suprapubic discomfort as well as malaise and myalgias. She denies fevers chills or back pain. Denies any change in bowels. No other signs of recent illness, chest pain, palpitations, shortness of breath.  Past Medical History  Diagnosis Date  . GERD (gastroesophageal reflux disease)   . IBS (irritable bowel syndrome)   . Interstitial cystitis   . Premature atrial contractions 2012  . Adult ADHD     SYMPTOMS ONLY  . Vitamin D deficiency 07/02/2011  . Poor concentration 07/02/2011  . Multiple allergies 07/02/2011  . Anxiety 07/31/2011  . UTI (lower urinary tract infection) 08/28/2011    Past Surgical History  Procedure Date  . Tonsillectomy 1982  . Rhinoplasty 2003  . Breast enhancement surgery 2007  . Cesarean section 2001 and 2004  . Laparoscopy     twice    Family History  Problem Relation Age of Onset  . Heart disease Mother     MVP  . Anxiety disorder Mother   . Hypertension Brother   . Diabetes Brother     PRE DIABETES  . Obesity Brother   . Allergies Daughter   . Asthma Daughter   . Allergies Son   . Asthma Son   . Kidney disease Maternal Grandmother     uremia    History   Social History  . Marital Status: Married    Spouse Name: N/A    Number of Children: N/A  . Years of Education: N/A   Occupational History  . Not on file.   Social History Main Topics  . Smoking status: Never Smoker   . Smokeless tobacco: Never Used  . Alcohol Use: No     RARELY  . Drug Use: No  . Sexually Active: Yes    Birth Control/ Protection:  Surgical   Other Topics Concern  . Not on file   Social History Narrative  . No narrative on file    Current Outpatient Prescriptions on File Prior to Visit  Medication Sig Dispense Refill  . aspirin 81 MG tablet Take 81 mg by mouth daily.        Marland Kitchen AVAR CLEANSER 10-5 % EMUL       . Biotin 2500 MCG CAPS Take 1 capsule by mouth daily.        . Calcium Citrate-Vitamin D 250-200 MG-UNIT TABS Take 1 tablet by mouth daily.  30 each  11  . Cholecalciferol (VITAMIN D PO) Take by mouth.        . Cholecalciferol (VITAMIN D) 2000 UNITS CAPS Take 1 capsule (2,000 Units total) by mouth once.  30 capsule  11  . fexofenadine (ALLEGRA) 180 MG tablet Take 180 mg by mouth daily.        . fish oil-omega-3 fatty acids 1000 MG capsule Take 2 g by mouth daily.       . fluticasone (FLONASE) 50 MCG/ACT nasal spray Place 2 sprays into the nose daily.        Marland Kitchen glucosamine-chondroitin 500-400 MG tablet Take 1 tablet by mouth.        . hydrOXYzine (  ATARAX) 10 MG tablet Take 10 mg by mouth as needed.        Marland Kitchen LORazepam (ATIVAN) 0.5 MG tablet Take 1 tablet (0.5 mg total) by mouth every 8 (eight) hours as needed for anxiety (or insomnia, 1/2 to 1 tab).  30 tablet  1  . Meth-Hyo-M Bl-Na Phos-Ph Sal (URIBEL PO) Take by mouth.        . methylphenidate (METADATE CD) 30 MG CR capsule Take 1 capsule (30 mg total) by mouth every morning. October 2012 rx  30 capsule  0  . methylphenidate (METADATE CD) 30 MG CR capsule Take 1 capsule (30 mg total) by mouth every morning.  30 capsule  0  . methylphenidate (METADATE CD) 30 MG CR capsule Take 1 capsule (30 mg total) by mouth every morning. December 2012 rx  30 capsule  0  . methylphenidate (RITALIN) 10 MG tablet Take 1 tablet (10 mg total) by mouth daily.  30 tablet  0  . Multiple Vitamin (MULTIVITAMIN) capsule Take 1 capsule by mouth daily.        . polysaccharide iron (NIFEREX) 150 MG CAPS capsule Take 150 mg by mouth daily.        . ranitidine (ZANTAC) 150 MG tablet Take 1  tablet (150 mg total) by mouth 2 (two) times daily as needed for heartburn.  60 tablet  1  . tretinoin (RETIN-A) 0.025 % cream       . ValACYclovir HCl (VALTREX PO) Take by mouth as needed.          No Known Allergies  Review of Systems  Review of Systems  Constitutional: Positive for malaise/fatigue. Negative for fever.  HENT: Negative for congestion.   Eyes: Negative for discharge.  Respiratory: Negative for shortness of breath.   Cardiovascular: Negative for chest pain, palpitations and leg swelling.  Gastrointestinal: Positive for abdominal pain. Negative for nausea and diarrhea.  Genitourinary: Positive for urgency and frequency. Negative for dysuria, hematuria and flank pain.  Musculoskeletal: Positive for myalgias. Negative for falls.  Skin: Negative for rash.  Neurological: Negative for loss of consciousness and headaches.  Endo/Heme/Allergies: Negative for polydipsia.  Psychiatric/Behavioral: Negative for depression and suicidal ideas. The patient is not nervous/anxious and does not have insomnia.     Objective  BP 97/57  Pulse 59  Temp(Src) 97.8 F (36.6 C) (Oral)  Ht 5' 5.5" (1.664 m)  Wt 118 lb (53.524 kg)  BMI 19.34 kg/m2  SpO2 100%  LMP 07/16/2011  Physical Exam  Physical Exam  Constitutional: She is oriented to person, place, and time and well-developed, well-nourished, and in no distress. No distress.  HENT:  Head: Normocephalic and atraumatic.  Eyes: Conjunctivae are normal.  Neck: Neck supple. No thyromegaly present.  Cardiovascular: Normal rate, regular rhythm and normal heart sounds.   No murmur heard. Pulmonary/Chest: Effort normal and breath sounds normal. She has no wheezes.  Abdominal: She exhibits no distension and no mass.  Musculoskeletal: She exhibits no edema.  Lymphadenopathy:    She has no cervical adenopathy.  Neurological: She is alert and oriented to person, place, and time.  Skin: Skin is warm and dry. No rash noted. She is not  diaphoretic.  Psychiatric: Memory, affect and judgment normal.    Lab Results  Component Value Date   TSH 1.682 12/02/2009   Lab Results  Component Value Date   WBC 3.9* 07/28/2011   HGB 13.5 07/28/2011   HCT 42.0 07/28/2011   MCV 88.4 07/28/2011   PLT 179  07/28/2011   Lab Results  Component Value Date   CREATININE 0.59 05/29/2009   BUN 12 05/29/2009   NA 142 05/29/2009   K 4.5 05/29/2009   CL 107 05/29/2009   CO2 27 05/29/2009   Lab Results  Component Value Date   ALT 11 05/29/2009   AST 11 05/29/2009   ALKPHOS 31* 05/29/2009   BILITOT 1.0 05/29/2009   Lab Results  Component Value Date   CHOL 154 05/29/2009   Lab Results  Component Value Date   HDL 48 05/29/2009   Lab Results  Component Value Date   LDLCALC 97 05/29/2009   Lab Results  Component Value Date   TRIG 46 05/29/2009   Lab Results  Component Value Date   CHOLHDL 3.2 Ratio 05/29/2009     Assessment & Plan  UTI (lower urinary tract infection) Symptoms developed over night, burning/frequency and urgency have developed. Urine will be sent for culture and she is started on ciprofloxacin 500mg  po bid and she is allowed some Hydrocodone for pain control. Increase hydration.

## 2011-09-09 ENCOUNTER — Ambulatory Visit (INDEPENDENT_AMBULATORY_CARE_PROVIDER_SITE_OTHER): Payer: BC Managed Care – PPO | Admitting: Family Medicine

## 2011-09-09 ENCOUNTER — Encounter: Payer: Self-pay | Admitting: Family Medicine

## 2011-09-09 VITALS — BP 91/59 | HR 67 | Temp 98.1°F | Ht 65.5 in | Wt 116.8 lb

## 2011-09-09 DIAGNOSIS — R41840 Attention and concentration deficit: Secondary | ICD-10-CM

## 2011-09-09 DIAGNOSIS — R413 Other amnesia: Secondary | ICD-10-CM

## 2011-09-09 DIAGNOSIS — N390 Urinary tract infection, site not specified: Secondary | ICD-10-CM

## 2011-09-09 MED ORDER — METHYLPHENIDATE HCL ER (LA) 20 MG PO CP24: 20.0000 mg | ORAL_CAPSULE | Freq: Every day | ORAL | Status: DC

## 2011-09-09 MED ORDER — METHYLPHENIDATE HCL 10 MG PO TABS: 10.0000 mg | ORAL_TABLET | Freq: Every day | ORAL | Status: DC

## 2011-09-19 ENCOUNTER — Encounter: Payer: Self-pay | Admitting: Family Medicine

## 2011-09-19 NOTE — Progress Notes (Signed)
[name] [id number] April 01, 1971 09/19/2011      Progress Note-Follow Up  Subjective  Chief Complaint  Chief Complaint  Patient presents with  . Follow-up    6 week follow up    HPI  Patient is a 40 yo caucasian female in today for follow up on multiple concerns, her urinary symptoms have resolved s/p course of antibiotics, no dysuria, fevers, abdominal pain. No GI c/o. She believes the Metadate has helped her concentration somewhat. She has not had any HA/cp/palp/sob/insomnia or increased anxiety  Past Medical History  Diagnosis Date  . GERD (gastroesophageal reflux disease)   . IBS (irritable bowel syndrome)   . Interstitial cystitis   . Premature atrial contractions 2012  . Adult ADHD     SYMPTOMS ONLY  . Vitamin D deficiency 07/02/2011  . Poor concentration 07/02/2011  . Multiple allergies 07/02/2011  . Anxiety 07/31/2011  . UTI (lower urinary tract infection) 08/28/2011    Past Surgical History  Procedure Date  . Tonsillectomy 1982  . Rhinoplasty 2003  . Breast enhancement surgery 2007  . Cesarean section 2001 and 2004  . Laparoscopy     twice    Family History  Problem Relation Age of Onset  . Heart disease Mother     MVP  . Anxiety disorder Mother   . Hypertension Brother   . Diabetes Brother     PRE DIABETES  . Obesity Brother   . Allergies Daughter   . Asthma Daughter   . Allergies Son   . Asthma Son   . Kidney disease Maternal Grandmother     uremia    History   Social History  . Marital Status: Married    Spouse Name: N/A    Number of Children: N/A  . Years of Education: N/A   Occupational History  . Not on file.   Social History Main Topics  . Smoking status: Never Smoker   . Smokeless tobacco: Never Used  . Alcohol Use: No     RARELY  . Drug Use: No  . Sexually Active: Yes    Birth Control/ Protection: Surgical   Other Topics Concern  . Not on file   Social History Narrative  . No narrative on file    Current  Outpatient Prescriptions on File Prior to Visit  Medication Sig Dispense Refill  . aspirin 81 MG tablet Take 81 mg by mouth daily.        Marland Kitchen AVAR CLEANSER 10-5 % EMUL       . Biotin 2500 MCG CAPS Take 1 capsule by mouth daily.        . Calcium Citrate-Vitamin D 250-200 MG-UNIT TABS Take 1 tablet by mouth daily.  30 each  11  . Cholecalciferol (VITAMIN D PO) Take by mouth.        . Cholecalciferol (VITAMIN D) 2000 UNITS CAPS Take 1 capsule (2,000 Units total) by mouth once.  30 capsule  11  . ciprofloxacin (CIPRO) 500 MG tablet 1 cap po bid x 4-7 days as directed  14 tablet  1  . fexofenadine (ALLEGRA) 180 MG tablet Take 180 mg by mouth daily.        . fluticasone (FLONASE) 50 MCG/ACT nasal spray Place 2 sprays into the nose daily.        Marland Kitchen glucosamine-chondroitin 500-400 MG tablet Take 1 tablet by mouth.        . hydrOXYzine (ATARAX) 10 MG tablet Take 10 mg by mouth as needed.        Marland Kitchen  LORazepam (ATIVAN) 0.5 MG tablet Take 1 tablet (0.5 mg total) by mouth every 8 (eight) hours as needed for anxiety (or insomnia, 1/2 to 1 tab).  30 tablet  1  . Meth-Hyo-M Bl-Na Phos-Ph Sal (URIBEL PO) Take by mouth.        . Multiple Vitamin (MULTIVITAMIN) capsule Take 1 capsule by mouth daily.        . polysaccharide iron (NIFEREX) 150 MG CAPS capsule Take 150 mg by mouth daily.        . ranitidine (ZANTAC) 150 MG tablet Take 1 tablet (150 mg total) by mouth 2 (two) times daily as needed for heartburn.  60 tablet  1  . tretinoin (RETIN-A) 0.025 % cream       . ValACYclovir HCl (VALTREX PO) Take by mouth as needed.        . fish oil-omega-3 fatty acids 1000 MG capsule Take 2 g by mouth daily.       . fluconazole (DIFLUCAN) 150 MG tablet 1 tab po q week x 2 weeks  2 tablet  1    No Known Allergies  Review of Systems  Review of Systems  Constitutional: Negative for fever and malaise/fatigue.  HENT: Negative for congestion.   Eyes: Negative for discharge.  Respiratory: Negative for shortness of breath.     Cardiovascular: Negative for chest pain, palpitations and leg swelling.  Gastrointestinal: Negative for nausea, abdominal pain and diarrhea.  Genitourinary: Negative for dysuria.  Musculoskeletal: Negative for falls.  Skin: Negative for rash.  Neurological: Negative for loss of consciousness and headaches.  Endo/Heme/Allergies: Negative for polydipsia.  Psychiatric/Behavioral: Negative for depression and suicidal ideas. The patient is not nervous/anxious and does not have insomnia.     Objective  BP 91/59  Pulse 67  Temp(Src) 98.1 F (36.7 C) (Oral)  Ht 5' 5.5" (1.664 m)  Wt 116 lb 12.8 oz (52.98 kg)  BMI 19.14 kg/m2  SpO2 100%  LMP 07/16/2011  Physical Exam  Physical Exam  Constitutional: She is oriented to person, place, and time and well-developed, well-nourished, and in no distress. No distress.  HENT:  Head: Normocephalic and atraumatic.  Eyes: Conjunctivae are normal.  Neck: Neck supple. No thyromegaly present.  Cardiovascular: Normal rate, regular rhythm and normal heart sounds.   No murmur heard. Pulmonary/Chest: Effort normal and breath sounds normal. She has no wheezes.  Abdominal: She exhibits no distension and no mass.  Musculoskeletal: She exhibits no edema.  Lymphadenopathy:    She has no cervical adenopathy.  Neurological: She is alert and oriented to person, place, and time.  Skin: Skin is warm and dry. No rash noted. She is not diaphoretic.  Psychiatric: Memory, affect and judgment normal.    Lab Results  Component Value Date   TSH 1.682 12/02/2009   Lab Results  Component Value Date   WBC 3.9* 07/28/2011   HGB 13.5 07/28/2011   HCT 42.0 07/28/2011   MCV 88.4 07/28/2011   PLT 179 07/28/2011   Lab Results  Component Value Date   CREATININE 0.59 05/29/2009   BUN 12 05/29/2009   NA 142 05/29/2009   K 4.5 05/29/2009   CL 107 05/29/2009   CO2 27 05/29/2009   Lab Results  Component Value Date   ALT 11 05/29/2009   AST 11 05/29/2009   ALKPHOS 31*  05/29/2009   BILITOT 1.0 05/29/2009   Lab Results  Component Value Date   CHOL 154 05/29/2009   Lab Results  Component Value Date  HDL 48 05/29/2009   Lab Results  Component Value Date   LDLCALC 97 05/29/2009   Lab Results  Component Value Date   TRIG 46 05/29/2009   Lab Results  Component Value Date   CHOLHDL 3.2 Ratio 05/29/2009     Assessment & Plan  Poor concentration Patient was started on Metadate at her last visit and she is tolerating it well. Denies any side effects. We will try using Ritalin long acting in am and may take a short acting dose in pm if she needs it.  UTI (lower urinary tract infection) Symptoms resolved with antibiotics at her last visit. Maintain adequate hydration

## 2011-09-19 NOTE — Assessment & Plan Note (Addendum)
Patient was started on Metadate at her last visit and she is tolerating it well. Denies any side effects. We will try using Ritalin long acting in am and may take a short acting dose in pm if she needs it.

## 2011-09-19 NOTE — Assessment & Plan Note (Signed)
Symptoms resolved with antibiotics at her last visit. Maintain adequate hydration

## 2011-10-08 ENCOUNTER — Ambulatory Visit (INDEPENDENT_AMBULATORY_CARE_PROVIDER_SITE_OTHER): Payer: BC Managed Care – PPO | Admitting: Family Medicine

## 2011-10-08 ENCOUNTER — Encounter: Payer: Self-pay | Admitting: Family Medicine

## 2011-10-08 VITALS — BP 87/56 | HR 83 | Temp 99.7°F | Ht 65.5 in | Wt 114.4 lb

## 2011-10-08 DIAGNOSIS — R41840 Attention and concentration deficit: Secondary | ICD-10-CM

## 2011-10-08 DIAGNOSIS — J40 Bronchitis, not specified as acute or chronic: Secondary | ICD-10-CM

## 2011-10-08 DIAGNOSIS — R413 Other amnesia: Secondary | ICD-10-CM

## 2011-10-08 LAB — POCT RAPID STREP A (OFFICE): Rapid Strep A Screen: NEGATIVE

## 2011-10-08 MED ORDER — AZITHROMYCIN 250 MG PO TABS: ORAL_TABLET | ORAL | Status: AC

## 2011-10-08 MED ORDER — METHYLPREDNISOLONE (PAK) 4 MG PO TABS: 4.0000 mg | ORAL_TABLET | Freq: Every day | ORAL | Status: DC

## 2011-10-08 MED ORDER — HYDROCODONE-HOMATROPINE 5-1.5 MG/5ML PO SYRP: 5.0000 mL | ORAL_SOLUTION | Freq: Four times a day (QID) | ORAL | Status: AC | PRN

## 2011-10-08 NOTE — Progress Notes (Signed)
[name] [id number] 08/25/1971 10/08/2011      Progress Note-Follow Up  Subjective  Chief Complaint  Chief Complaint  Patient presents with  . Follow-up    1 month follow up  . Cough    X 2 days- dry  . Nausea    X 2 days  . Generalized Body Aches    X 2 days  . Chills    X 1 day  . Sore Throat    X 2 days    HPI  Patient is a 40 year old female in today with sore throat, cough, malaise, chills, myalgias. Entire family is sick with similar symptoms. She's been struggling with fevers, malaise, nausea, anorexia, sore throat for 2 days now. She is coughing to the point that is keeping her up at night. Slightly chest wall and abdominal wall discomfort. He has very little appetite and is not hard time keeping herself hydrated. No palpitations. She does get more winded than usual. No GU complaints. Past Medical History  Diagnosis Date  . GERD (gastroesophageal reflux disease)   . IBS (irritable bowel syndrome)   . Interstitial cystitis   . Premature atrial contractions 2012  . Adult ADHD     SYMPTOMS ONLY  . Vitamin D deficiency 07/02/2011  . Poor concentration 07/02/2011  . Multiple allergies 07/02/2011  . Anxiety 07/31/2011  . UTI (lower urinary tract infection) 08/28/2011    Past Surgical History  Procedure Date  . Tonsillectomy 1982  . Rhinoplasty 2003  . Breast enhancement surgery 2007  . Cesarean section 2001 and 2004  . Laparoscopy     twice    Family History  Problem Relation Age of Onset  . Heart disease Mother     MVP  . Anxiety disorder Mother   . Hypertension Brother   . Diabetes Brother     PRE DIABETES  . Obesity Brother   . Allergies Daughter   . Asthma Daughter   . Allergies Son   . Asthma Son   . Kidney disease Maternal Grandmother     uremia    History   Social History  . Marital Status: Married    Spouse Name: N/A    Number of Children: N/A  . Years of Education: N/A   Occupational History  . Not on file.   Social  History Main Topics  . Smoking status: Never Smoker   . Smokeless tobacco: Never Used  . Alcohol Use: No     RARELY  . Drug Use: No  . Sexually Active: Yes    Birth Control/ Protection: Surgical   Other Topics Concern  . Not on file   Social History Narrative  . No narrative on file    Current Outpatient Prescriptions on File Prior to Visit  Medication Sig Dispense Refill  . aspirin 81 MG tablet Take 81 mg by mouth daily.        Marland Kitchen AVAR CLEANSER 10-5 % EMUL       . Biotin 2500 MCG CAPS Take 1 capsule by mouth daily.        . Calcium Citrate-Vitamin D 250-200 MG-UNIT TABS Take 1 tablet by mouth daily.  30 each  11  . Cholecalciferol (VITAMIN D PO) Take by mouth.        . Cholecalciferol (VITAMIN D) 2000 UNITS CAPS Take 1 capsule (2,000 Units total) by mouth once.  30 capsule  11  . ciprofloxacin (CIPRO) 500 MG tablet 1 cap po bid x 4-7 days  as directed  14 tablet  1  . fexofenadine (ALLEGRA) 180 MG tablet Take 180 mg by mouth daily.        . fish oil-omega-3 fatty acids 1000 MG capsule Take 2 g by mouth daily.       . fluconazole (DIFLUCAN) 150 MG tablet 1 tab po q week x 2 weeks  2 tablet  1  . fluticasone (FLONASE) 50 MCG/ACT nasal spray Place 2 sprays into the nose daily.        Marland Kitchen glucosamine-chondroitin 500-400 MG tablet Take 1 tablet by mouth.        . hydrOXYzine (ATARAX) 10 MG tablet Take 10 mg by mouth as needed.        Marland Kitchen LORazepam (ATIVAN) 0.5 MG tablet Take 1 tablet (0.5 mg total) by mouth every 8 (eight) hours as needed for anxiety (or insomnia, 1/2 to 1 tab).  30 tablet  1  . Meth-Hyo-M Bl-Na Phos-Ph Sal (URIBEL PO) Take by mouth.        . methylphenidate (RITALIN LA) 20 MG 24 hr capsule Take 1 capsule (20 mg total) by mouth daily.  30 capsule  0  . methylphenidate (RITALIN) 10 MG tablet Take 1 tablet (10 mg total) by mouth daily.  30 tablet  0  . Multiple Vitamin (MULTIVITAMIN) capsule Take 1 capsule by mouth daily.        . polysaccharide iron (NIFEREX) 150 MG CAPS  capsule Take 150 mg by mouth daily.        . ranitidine (ZANTAC) 150 MG tablet Take 1 tablet (150 mg total) by mouth 2 (two) times daily as needed for heartburn.  60 tablet  1  . tretinoin (RETIN-A) 0.025 % cream       . ValACYclovir HCl (VALTREX PO) Take by mouth as needed.          No Known Allergies  Review of Systems  Review of Systems  Constitutional: Positive for fever, chills and malaise/fatigue.  HENT: Positive for ear pain, congestion and sore throat.   Eyes: Negative for discharge.  Respiratory: Positive for cough. Negative for shortness of breath.   Cardiovascular: Negative for chest pain, palpitations and leg swelling.  Gastrointestinal: Positive for nausea. Negative for vomiting, abdominal pain and diarrhea.  Genitourinary: Negative for dysuria.  Musculoskeletal: Positive for myalgias. Negative for falls.  Skin: Negative for rash.  Neurological: Positive for weakness and headaches. Negative for loss of consciousness.  Endo/Heme/Allergies: Negative for polydipsia.  Psychiatric/Behavioral: Negative for depression and suicidal ideas. The patient has insomnia. The patient is not nervous/anxious.     Objective  BP 87/56  Pulse 83  Temp(Src) 99.7 F (37.6 C) (Oral)  Ht 5' 5.5" (1.664 m)  Wt 114 lb 6.4 oz (51.891 kg)  BMI 18.75 kg/m2  SpO2 100%  LMP 09/19/2011  Physical Exam  Physical Exam  Constitutional: She is oriented to person, place, and time and well-developed, well-nourished, and in no distress. No distress.  HENT:  Head: Normocephalic and atraumatic.  Eyes: Conjunctivae are normal.  Neck: Neck supple. No thyromegaly present.       TMs retracted and erythematous b/l. Oropharynx is erythematous  Cardiovascular: Normal rate, regular rhythm and normal heart sounds.   No murmur heard. Pulmonary/Chest: Effort normal. She has no wheezes.       Decreased breath sounds  Abdominal: Soft. Bowel sounds are normal. She exhibits no distension and no mass.    Musculoskeletal: She exhibits no edema.  Lymphadenopathy:    She has  cervical adenopathy.  Neurological: She is alert and oriented to person, place, and time.  Skin: Skin is warm and dry. No rash noted. She is not diaphoretic.  Psychiatric: Memory, affect and judgment normal.    Lab Results  Component Value Date   TSH 1.682 12/02/2009   Lab Results  Component Value Date   WBC 3.9* 07/28/2011   HGB 13.5 07/28/2011   HCT 42.0 07/28/2011   MCV 88.4 07/28/2011   PLT 179 07/28/2011   Lab Results  Component Value Date   CREATININE 0.59 05/29/2009   BUN 12 05/29/2009   NA 142 05/29/2009   K 4.5 05/29/2009   CL 107 05/29/2009   CO2 27 05/29/2009   Lab Results  Component Value Date   ALT 11 05/29/2009   AST 11 05/29/2009   ALKPHOS 31* 05/29/2009   BILITOT 1.0 05/29/2009   Lab Results  Component Value Date   CHOL 154 05/29/2009   Lab Results  Component Value Date   HDL 48 05/29/2009   Lab Results  Component Value Date   LDLCALC 97 05/29/2009   Lab Results  Component Value Date   TRIG 46 05/29/2009   Lab Results  Component Value Date   CHOLHDL 3.2 Ratio 05/29/2009     Assessment & Plan  Poor concentration Has not made the switch to the long acting Ritalin yet. We will reassess at next visit.  Bronchitis And pharyngitis, strep rapid test negative. Patient encouraged to run a humidifier, increase hydration and rest. Given a Zpack and Medrol dose pack as well as some Hydromet to use prn, report if no improvemetn

## 2011-10-08 NOTE — Assessment & Plan Note (Signed)
And pharyngitis, strep rapid test negative. Patient encouraged to run a humidifier, increase hydration and rest. Given a Zpack and Medrol dose pack as well as some Hydromet to use prn, report if no improvemetn

## 2011-10-08 NOTE — Assessment & Plan Note (Signed)
Has not made the switch to the long acting Ritalin yet. We will reassess at next visit.

## 2011-10-08 NOTE — Patient Instructions (Signed)
Bronchitis Bronchitis is the body's way of reacting to injury and/or infection (inflammation) of the bronchi. Bronchi are the air tubes that extend from the windpipe into the lungs. If the inflammation becomes severe, it may cause shortness of breath. CAUSES  Inflammation may be caused by:  A virus.   Germs (bacteria).   Dust.   Allergens.   Pollutants and many other irritants.  The cells lining the bronchial tree are covered with tiny hairs (cilia). These constantly beat upward, away from the lungs, toward the mouth. This keeps the lungs free of pollutants. When these cells become too irritated and are unable to do their job, mucus begins to develop. This causes the characteristic cough of bronchitis. The cough clears the lungs when the cilia are unable to do their job. Without either of these protective mechanisms, the mucus would settle in the lungs. Then you would develop pneumonia. Smoking is a common cause of bronchitis and can contribute to pneumonia. Stopping this habit is the single most important thing you can do to help yourself. TREATMENT   Your caregiver may prescribe an antibiotic if the cough is caused by bacteria. Also, medicines that open up your airways make it easier to breathe. Your caregiver may also recommend or prescribe an expectorant. It will loosen the mucus to be coughed up. Only take over-the-counter or prescription medicines for pain, discomfort, or fever as directed by your caregiver.   Removing whatever causes the problem (smoking, for example) is critical to preventing the problem from getting worse.   Cough suppressants may be prescribed for relief of cough symptoms.   Inhaled medicines may be prescribed to help with symptoms now and to help prevent problems from returning.   For those with recurrent (chronic) bronchitis, there may be a need for steroid medicines.  SEEK IMMEDIATE MEDICAL CARE IF:   During treatment, you develop more pus-like mucus  (purulent sputum).   You have a fever.   Your baby is older than 3 months with a rectal temperature of 102 F (38.9 C) or higher.   Your baby is 41 months old or younger with a rectal temperature of 100.4 F (38 C) or higher.   You become progressively more ill.   You have increased difficulty breathing, wheezing, or shortness of breath.  It is necessary to seek immediate medical care if you are elderly or sick from any other disease. MAKE SURE YOU:   Understand these instructions.   Will watch your condition.   Will get help right away if you are not doing well or get worse.  Document Released: 10/18/2005 Document Revised: 06/30/2011 Document Reviewed: 08/27/2008 [facility] Patient Information 2012 [place], [place].  Increase fluids and run a humidifier

## 2011-11-10 ENCOUNTER — Ambulatory Visit: Payer: BC Managed Care – PPO | Admitting: Family Medicine

## 2011-11-17 ENCOUNTER — Ambulatory Visit: Payer: BC Managed Care – PPO | Admitting: Family Medicine

## 2011-11-17 DIAGNOSIS — Z0289 Encounter for other administrative examinations: Secondary | ICD-10-CM

## 2012-02-14 ENCOUNTER — Ambulatory Visit (INDEPENDENT_AMBULATORY_CARE_PROVIDER_SITE_OTHER): Payer: BC Managed Care – PPO | Admitting: Cardiology

## 2012-02-14 ENCOUNTER — Encounter: Payer: Self-pay | Admitting: Cardiology

## 2012-02-14 VITALS — BP 90/65 | HR 62 | Ht 66.0 in | Wt 117.0 lb

## 2012-02-14 DIAGNOSIS — R002 Palpitations: Secondary | ICD-10-CM

## 2012-02-14 NOTE — Assessment & Plan Note (Signed)
These have been more frequent for a few weeks.  Diagnosed as PACs in the past.  Extensive past cardiac workup has shown structurally normal heart. Avoids caffeine.  Only new drug recently has been Ritalin.  This certainly could be triggering more frequent PACs.   - 2 week event monitor: Want to make sure that this just represents PACs and there are no runs of atrial fibrillation.   - Check TSH - She will monitor her symptoms and see if palpitations are more frequent on days she takes Ritalin.   - If she remains symptomatic and monitor shows PACs, could consider beta blocker but will need to be careful with low baseline BP.

## 2012-02-14 NOTE — Progress Notes (Signed)
PCP: Dr. Abner Greenspan  41 yo presents for evaluation of palpitations.  She has had palpitations in the past, but they have been more frequent for the last few weeks.  She had an extensive past workup by Dr. Mayford Knife (see PMH below), showing a structurally normal heart.  The only arrhythmia that could be detected was PACs.  She will feels her heart "flip-flopping" in her chest.  This will happen a number of times in a row.  It does not beat particularly fast.  She notices this more at rest and not with exercise.  Especially bothersome when lying in bed at night.  No lightheadedness or syncope.  No chest pain.  Excellent exercise tolerance without limitations.  She avoids caffeinated beverages but she does eat chocolate.  Of note, she has been taking Ritalin now for ADHD for about 3 months.  She does not take it every day, but she cannot say if she notes the palpitations primarily on days that she takes Ritalin.    ECG: NSR, right superior axis  Labs (8/12): TSH normal  PMH: 1. GERD 2. IBS 3. Interstitial cystitis 4. PACs: Extensive past workup with Dr. Mayford Knife.  Event monitors in 2008 and 2011 showed only NSR and PACs.  ETT in 2008 was normal.   ETT-myoview in 11/11 showed 15 METS, EF 59%, no ischemia or infarction.   Echo (11/11) showed EF 60%, no significant valvular abnormality.  5. ADHD 6. H/o UTIs  SH: Married (Tripp [place]ubon), 2 children, nonsmoker, rare ETOH.  FH: HTN, mother with MVP, no atrial fibrillation.   ROS: All systems reviewed and negative except as per HPI.   Current Outpatient Prescriptions  Medication Sig Dispense Refill  . aspirin 81 MG tablet Take 81 mg by mouth daily.        Marland Kitchen AVAR CLEANSER 10-5 % EMUL       . Biotin 2500 MCG CAPS Take 1 capsule by mouth daily.        . Calcium Citrate-Vitamin D 250-200 MG-UNIT TABS Take 1 tablet by mouth daily.  30 each  11  . Cholecalciferol (VITAMIN D PO) Take by mouth.        . Cholecalciferol (VITAMIN D) 2000 UNITS CAPS Take 1 capsule (2,000  Units total) by mouth once.  30 capsule  11  . fexofenadine (ALLEGRA) 180 MG tablet Take 180 mg by mouth daily.        . finasteride (PROSCAR) 5 MG tablet Take 5 mg by mouth daily. Take 1/2 tab daily      . fish oil-omega-3 fatty acids 1000 MG capsule Take 2 g by mouth daily.       . fluticasone (FLONASE) 50 MCG/ACT nasal spray Place 2 sprays into the nose daily.        . fluticasone (VERAMYST) 27.5 MCG/SPRAY nasal spray Place 2 sprays into the nose daily.      . hydrOXYzine (ATARAX) 10 MG tablet Take 10 mg by mouth as needed.        . methylphenidate (RITALIN LA) 20 MG 24 hr capsule Take 1 capsule (20 mg total) by mouth daily.  30 capsule  0  . methylphenidate (RITALIN) 10 MG tablet Take 10 mg by mouth daily. As needed      . methylPREDNIsolone (MEDROL DOSPACK) 4 MG tablet Take 1 tablet (4 mg total) by mouth daily. follow package directions  21 tablet  0  . Multiple Vitamin (MULTIVITAMIN) capsule Take 1 capsule by mouth daily.        Marland Kitchen  nitrofurantoin (MACRODANTIN) 50 MG capsule As needed      . Polysaccharide Iron Complex (POLY-IRON 150 PO) Take 150 mg by mouth 2 (two) times daily.      . ranitidine (ZANTAC) 150 MG tablet Take 1 tablet (150 mg total) by mouth 2 (two) times daily as needed for heartburn.  60 tablet  1  . senna (SENOKOT) 8.6 MG TABS Take 1 tablet by mouth daily.      Marland Kitchen spironolactone (ALDACTONE) 25 MG tablet Take 25 mg by mouth daily. Take 2 tabs daily      . tretinoin (RETIN-A) 0.025 % cream       . ValACYclovir HCl (VALTREX PO) Take by mouth as needed.        . zinc gluconate 50 MG tablet Take 50 mg by mouth daily.      Marland Kitchen DISCONTD: methylphenidate (RITALIN) 10 MG tablet Take 1 tablet (10 mg total) by mouth daily.  30 tablet  0  . methylphenidate (METADATE CD) 30 MG CR capsule Take 1 capsule (30 mg total) by mouth every morning.  30 capsule  0  . methylphenidate (METADATE CD) 30 MG CR capsule Take 1 capsule (30 mg total) by mouth every morning. December 2012 rx  30 capsule  0     BP 90/65  Pulse 62  Ht 5\' 6"  (1.676 m)  Wt 117 lb (53.071 kg)  BMI 18.88 kg/m2 General: NAD Neck: No JVD, no thyromegaly or thyroid nodule.  Lungs: Clear to auscultation bilaterally with normal respiratory effort. CV: Nondisplaced PMI.  Heart regular S1/S2, no S3/S4, no murmur.  No peripheral edema.  No carotid bruit.  Normal pedal pulses.  Abdomen: Soft, nontender, no hepatosplenomegaly, no distention.  Skin: Intact without lesions or rashes.  Neurologic: Alert and oriented x 3.  Psych: Normal affect. Extremities: No clubbing or cyanosis.  HEENT: Normal.

## 2012-02-14 NOTE — Patient Instructions (Signed)
Your physician recommends that you have  lab work today--TSH.  Your physician has recommended that you wear an event monitor. Event monitors are medical devices that record the heart's electrical activity. Doctors most often [place] these monitors to diagnose arrhythmias. Arrhythmias are problems with the speed or rhythm of the heartbeat. The monitor is a small, portable device. You can wear one while you do your normal daily activities. This is usually used to diagnose what is causing palpitations/syncope (passing out). 2 week event monitor  Your physician recommends that you schedule a follow-up appointment in: about 4 weeks with Dr Shirlee Latch after the monitor has been done.

## 2012-02-15 ENCOUNTER — Encounter (INDEPENDENT_AMBULATORY_CARE_PROVIDER_SITE_OTHER): Payer: BC Managed Care – PPO

## 2012-02-15 DIAGNOSIS — R002 Palpitations: Secondary | ICD-10-CM

## 2012-02-15 LAB — TSH: TSH: 0.93 u[IU]/mL (ref 0.35–5.50)

## 2012-02-22 ENCOUNTER — Encounter: Payer: Self-pay | Admitting: Cardiology

## 2012-02-29 ENCOUNTER — Other Ambulatory Visit: Payer: Self-pay | Admitting: Family Medicine

## 2012-02-29 ENCOUNTER — Telehealth: Payer: Self-pay | Admitting: Family Medicine

## 2012-02-29 NOTE — Telephone Encounter (Signed)
So I reviewed her last MGM in March of 2012 and they recommended a routine "screening MGM" in a year. That was negative so I do not think they are saying she needs another diagnostic mgm. She can actually schedule a screening one her self. If she is having new symptoms I should probably see her to document her exam

## 2012-02-29 NOTE — Telephone Encounter (Signed)
Please advise 

## 2012-03-01 NOTE — Telephone Encounter (Signed)
Pt states it has been a year and is going to call her GYN because he is the one following the lump under her arm

## 2012-03-06 ENCOUNTER — Encounter: Payer: Self-pay | Admitting: Cardiology

## 2012-03-07 ENCOUNTER — Encounter: Payer: Self-pay | Admitting: Family Medicine

## 2012-03-07 ENCOUNTER — Ambulatory Visit (INDEPENDENT_AMBULATORY_CARE_PROVIDER_SITE_OTHER): Payer: BC Managed Care – PPO | Admitting: Family Medicine

## 2012-03-07 VITALS — BP 93/56 | HR 65 | Temp 98.9°F | Ht 65.5 in | Wt 118.1 lb

## 2012-03-07 DIAGNOSIS — R41840 Attention and concentration deficit: Secondary | ICD-10-CM

## 2012-03-07 DIAGNOSIS — R002 Palpitations: Secondary | ICD-10-CM

## 2012-03-07 DIAGNOSIS — F909 Attention-deficit hyperactivity disorder, unspecified type: Secondary | ICD-10-CM

## 2012-03-07 DIAGNOSIS — N649 Disorder of breast, unspecified: Secondary | ICD-10-CM

## 2012-03-07 DIAGNOSIS — J309 Allergic rhinitis, unspecified: Secondary | ICD-10-CM

## 2012-03-07 DIAGNOSIS — Z1239 Encounter for other screening for malignant neoplasm of breast: Secondary | ICD-10-CM

## 2012-03-07 MED ORDER — METHYLPHENIDATE HCL 10 MG PO TABS: 10.0000 mg | ORAL_TABLET | Freq: Every day | ORAL | Status: DC

## 2012-03-07 MED ORDER — METHYLPHENIDATE HCL 10 MG PO TABS: 10.0000 mg | ORAL_TABLET | Freq: Three times a day (TID) | ORAL | Status: DC

## 2012-03-07 MED ORDER — MOMETASONE FUROATE 50 MCG/ACT NA SUSP: 2.0000 | Freq: Every day | NASAL | Status: DC

## 2012-03-07 NOTE — Assessment & Plan Note (Signed)
She believes the low dose Ritalin has helped some but she has taken it very infrequently. She has had an increase in the past several months and is actually wearing a cardiac event monitor at this time. It is unlikely that Ritalin is the cause as she has only taken roughly 1 pill per week. She might benefit from a more sustained release preparation that is not frequently substituted out by the pharmacies but for this month we will try to increase her Ritalin to a max of 30 mg daily and monitor her symptoms

## 2012-03-07 NOTE — Assessment & Plan Note (Signed)
Wearing an event monitor, symptoms happening daily now.

## 2012-03-07 NOTE — Progress Notes (Signed)
Patient ID: [name], female   DOB: 02-Sep-1971, 41 y.o.   MRN: [id number] [name] [id number] 1971/03/23 03/07/2012      Progress Note-Follow Up  Subjective  Chief Complaint  Chief Complaint  Patient presents with  . Medication Refill    for ADD    HPI  Female who is in today for consideration of increasing medication dose. She was given a prescription for Ritalin 10 mg back in January, #30 tabs to take daily as needed. She's taken roughly one pill weekly and does feel as if she feels more focused and able to get through her day when she takes it. Over the same period of time she is also noted increasing her PACs to the point where she is feeling palpitations every day instead of infrequently. She is presently wearing an event monitor and is following with cardiology. She does not note any pattern with palpitations increasing in intensity frequency or length when she does take the Ritalin on occasion. She denies chest pain, headaches, anorexia or other untoward symptoms with the Ritalin.  Past Medical History  Diagnosis Date  . GERD (gastroesophageal reflux disease)   . IBS (irritable bowel syndrome)   . Interstitial cystitis   . Premature atrial contractions 2012  . Adult ADHD     SYMPTOMS ONLY  . Vitamin d deficiency 07/02/2011  . Poor concentration 07/02/2011  . Multiple allergies 07/02/2011  . Anxiety 07/31/2011  . UTI (lower urinary tract infection) 08/28/2011  . Bronchitis 10/08/2011    Past Surgical History  Procedure Date  . Tonsillectomy 1982  . Rhinoplasty 2003  . Breast enhancement surgery 2007  . Cesarean section 2001 and 2004  . Laparoscopy     twice    Family History  Problem Relation Age of Onset  . Heart disease Mother     MVP  . Anxiety disorder Mother   . Hypertension Brother   . Diabetes Brother     PRE DIABETES  . Obesity Brother   . Allergies Daughter   . Asthma Daughter   . Allergies Son   . Asthma Son   . Kidney disease  Maternal Grandmother     uremia    History   Social History  . Marital Status: Married    Spouse Name: N/A    Number of Children: N/A  . Years of Education: N/A   Occupational History  . Not on file.   Social History Main Topics  . Smoking status: Never Smoker   . Smokeless tobacco: Never Used  . Alcohol Use: No     RARELY  . Drug Use: No  . Sexually Active: Yes    Birth Control/ Protection: Surgical   Other Topics Concern  . Not on file   Social History Narrative  . No narrative on file    Current Outpatient Prescriptions on File Prior to Visit  Medication Sig Dispense Refill  . aspirin 81 MG tablet Take 81 mg by mouth daily.        Marland Kitchen AVAR CLEANSER 10-5 % EMUL       . Biotin 2500 MCG CAPS Take 1 capsule by mouth daily.        . Calcium Citrate-Vitamin D 250-200 MG-UNIT TABS Take 1 tablet by mouth daily.  30 each  11  . Cholecalciferol (VITAMIN D) 2000 UNITS CAPS Take 1 capsule (2,000 Units total) by mouth once.  30 capsule  11  . fexofenadine (ALLEGRA) 180 MG tablet Take 180 mg by  mouth daily.        . finasteride (PROSCAR) 5 MG tablet Take 5 mg by mouth daily. Take 1/2 tab daily      . fish oil-omega-3 fatty acids 1000 MG capsule Take 2 g by mouth daily.       . hydrOXYzine (ATARAX) 10 MG tablet Take 10 mg by mouth as needed.        . methylphenidate (RITALIN LA) 20 MG 24 hr capsule Take 1 capsule (20 mg total) by mouth daily.  30 capsule  0  . methylphenidate (RITALIN) 10 MG tablet Take 10 mg by mouth daily. As needed      . mometasone (NASONEX) 50 MCG/ACT nasal spray Place 2 sprays into the nose daily.  17 g  5  . Multiple Vitamin (MULTIVITAMIN) capsule Take 1 capsule by mouth daily.        . nitrofurantoin (MACRODANTIN) 50 MG capsule As needed      . Polysaccharide Iron Complex (POLY-IRON 150 PO) Take 150 mg by mouth 2 (two) times daily.      . ranitidine (ZANTAC) 150 MG tablet Take 1 tablet (150 mg total) by mouth 2 (two) times daily as needed for heartburn.  60  tablet  1  . senna (SENOKOT) 8.6 MG TABS Take 1 tablet by mouth daily.      Marland Kitchen spironolactone (ALDACTONE) 25 MG tablet Take 25 mg by mouth daily. Take 2 tabs daily      . tretinoin (RETIN-A) 0.025 % cream       . ValACYclovir HCl (VALTREX PO) Take by mouth as needed.        . zinc gluconate 50 MG tablet Take 50 mg by mouth daily.      . methylphenidate (METADATE CD) 30 MG CR capsule Take 1 capsule (30 mg total) by mouth every morning.  30 capsule  0  . methylphenidate (METADATE CD) 30 MG CR capsule Take 1 capsule (30 mg total) by mouth every morning. December 2012 rx  30 capsule  0    No Known Allergies  Review of Systems  Review of Systems  Constitutional: Positive for malaise/fatigue. Negative for fever.  HENT: Negative for congestion.   Eyes: Negative for discharge.  Respiratory: Negative for shortness of breath.   Cardiovascular: Negative for chest pain, palpitations and leg swelling.  Gastrointestinal: Negative for nausea, abdominal pain and diarrhea.  Genitourinary: Negative for dysuria.  Musculoskeletal: Negative for falls.  Skin: Negative for rash.  Neurological: Negative for loss of consciousness and headaches.  Endo/Heme/Allergies: Negative for polydipsia.  Psychiatric/Behavioral: Negative for depression and suicidal ideas. The patient is not nervous/anxious and does not have insomnia.     Objective  BP 93/56  Pulse 65  Temp(Src) 98.9 F (37.2 C) (Temporal)  Ht 5' 5.5" (1.664 m)  Wt 118 lb 1.9 oz (53.579 kg)  BMI 19.36 kg/m2  SpO2 100%  LMP 02/25/2012  Physical Exam  Physical Exam  Constitutional: She is oriented to person, place, and time and well-developed, well-nourished, and in no distress. No distress.  HENT:  Head: Normocephalic and atraumatic.  Eyes: Conjunctivae are normal.  Neck: Neck supple. No thyromegaly present.  Cardiovascular: Normal rate, regular rhythm and normal heart sounds.   No murmur heard. Pulmonary/Chest: Effort normal and breath  sounds normal. She has no wheezes.  Abdominal: She exhibits no distension and no mass.  Musculoskeletal: She exhibits no edema.  Lymphadenopathy:    She has no cervical adenopathy.  Neurological: She is alert and oriented  to person, place, and time.  Skin: Skin is warm and dry. No rash noted. She is not diaphoretic.  Psychiatric: Memory, affect and judgment normal.    Lab Results  Component Value Date   TSH 0.93 02/14/2012   Lab Results  Component Value Date   WBC 3.9* 07/28/2011   HGB 13.5 07/28/2011   HCT 42.0 07/28/2011   MCV 88.4 07/28/2011   PLT 179 07/28/2011   Lab Results  Component Value Date   CREATININE 0.59 05/29/2009   BUN 12 05/29/2009   NA 142 05/29/2009   K 4.5 05/29/2009   CL 107 05/29/2009   CO2 27 05/29/2009   Lab Results  Component Value Date   ALT 11 05/29/2009   AST 11 05/29/2009   ALKPHOS 31* 05/29/2009   BILITOT 1.0 05/29/2009   Lab Results  Component Value Date   CHOL 154 05/29/2009   Lab Results  Component Value Date   HDL 48 05/29/2009   Lab Results  Component Value Date   LDLCALC 97 05/29/2009   Lab Results  Component Value Date   TRIG 46 05/29/2009   Lab Results  Component Value Date   CHOLHDL 3.2 Ratio 05/29/2009     Assessment & Plan  Adult ADHD She believes the low dose Ritalin has helped some but she has taken it very infrequently. She has had an increase in the past several months and is actually wearing a cardiac event monitor at this time. It is unlikely that Ritalin is the cause as she has only taken roughly 1 pill per week. She might benefit from a more sustained release preparation that is not frequently substituted out by the pharmacies but for this month we will try to increase her Ritalin to a max of 30 mg daily and monitor her symptoms  Palpitations Wearing an event monitor, symptoms happening daily now.

## 2012-03-14 ENCOUNTER — Ambulatory Visit: Payer: BC Managed Care – PPO | Admitting: Cardiology

## 2012-03-15 ENCOUNTER — Ambulatory Visit
Admission: RE | Admit: 2012-03-15 | Discharge: 2012-03-15 | Disposition: A | Discharge status: Home / Self Care | Payer: Self-pay | Source: Ambulatory Visit | Attending: Family Medicine | Admitting: Family Medicine

## 2012-03-15 ENCOUNTER — Other Ambulatory Visit: Payer: Self-pay | Admitting: Family Medicine

## 2012-03-15 DIAGNOSIS — N649 Disorder of breast, unspecified: Secondary | ICD-10-CM

## 2012-03-16 ENCOUNTER — Ambulatory Visit (INDEPENDENT_AMBULATORY_CARE_PROVIDER_SITE_OTHER): Payer: BC Managed Care – PPO | Admitting: Family Medicine

## 2012-03-16 ENCOUNTER — Encounter: Payer: Self-pay | Admitting: Family Medicine

## 2012-03-16 VITALS — BP 92/56 | HR 72 | Temp 98.7°F | Ht 65.5 in | Wt 118.8 lb

## 2012-03-16 DIAGNOSIS — J029 Acute pharyngitis, unspecified: Secondary | ICD-10-CM

## 2012-03-16 DIAGNOSIS — H6690 Otitis media, unspecified, unspecified ear: Secondary | ICD-10-CM

## 2012-03-16 HISTORY — DX: Otitis media, unspecified, unspecified ear: H66.90

## 2012-03-16 MED ORDER — CEFDINIR 300 MG PO CAPS: 300.0000 mg | ORAL_CAPSULE | Freq: Two times a day (BID) | ORAL | Status: AC

## 2012-03-16 MED ORDER — FLUCONAZOLE 150 MG PO TABS: ORAL_TABLET | ORAL | Status: DC

## 2012-03-16 MED ORDER — HYDROCOD POLST-CHLORPHEN POLST 10-8 MG/5ML PO LQCR: 5.0000 mL | Freq: Two times a day (BID) | ORAL | Status: DC | PRN

## 2012-03-16 NOTE — Progress Notes (Signed)
Addended by: Baldemar Lenis R on: 03/16/2012 02:19 PM   Modules accepted: Orders

## 2012-03-16 NOTE — Patient Instructions (Signed)
Otitis Media, Adult A middle ear infection is an infection in the space behind the eardrum. It often happens along with a cold. It is caused by a germ that starts growing in that space. Your neck may feel puffy (swollen) on the side of the ear infection. HOME CARE  Take your medicine as told. Finish it even if you start to feel better.   Nose medicine (nasal decongestant) may help the tube that connects the ear and throat (eustachian tube) drain better. It may also help with discomfort.   Follow up with your doctor in 10 to 14 days or as told by your doctor. This is to make sure the infection is gone.  GET HELP RIGHT AWAY IF:   You do not start to feel better in 2 to 3 days.   You have pain that is not helped with medicine.   You cannot use the medicine as told.   You feel worse instead of better.   You develop puffiness, redness, or pain around the ear.   You get a stiff neck.  MAKE SURE YOU:   Understand these instructions.   Will watch your condition.   Will get help right away if you are not doing well or get worse.  Document Released: 04/05/2008 Document Revised: 10/07/2011 Document Reviewed: 04/05/2008 [employer] Patient Information 2012 [employer] 

## 2012-03-16 NOTE — Assessment & Plan Note (Signed)
Pharyngitis also noted. Strep negative in office. Ears mildly erythematous. Likely viral illness. Patient is encouraged to increase hydration and rest, add Mucinex and is given an rx to use prn if symptoms worsen for Omnicef, Tussionex and Diflucan. Call if no repsonse

## 2012-03-16 NOTE — Progress Notes (Signed)
Patient ID: [name], female   DOB: 1970/12/06, 41 y.o.   MRN: [id number] [name] [id number] 18-Apr-1971 03/16/2012      Progress Note-Follow Up  Subjective  Chief Complaint  Chief Complaint  Patient presents with  . Fever    low grade   . Sore Throat    pt wants to be checked for strep    HPI  Patient is a 41 year old Caucasian female in today for complaints of sore throat.  She has had symptoms now for roughly 3 days. She's had some low-grade fevers , malaise. She has some minor nasal congestion and occasional irritative nonproductive cough. Pressure in her ears is also noted. No high-grade fevers.  Past Medical History  Diagnosis Date  . GERD (gastroesophageal reflux disease)   . IBS (irritable bowel syndrome)   . Interstitial cystitis   . Premature atrial contractions 2012  . Adult ADHD     SYMPTOMS ONLY  . Vitamin d deficiency 07/02/2011  . Poor concentration 07/02/2011  . Multiple allergies 07/02/2011  . Anxiety 07/31/2011  . UTI (lower urinary tract infection) 08/28/2011  . Bronchitis 10/08/2011  . Otitis media 03/16/2012    Past Surgical History  Procedure Date  . Tonsillectomy 1982  . Rhinoplasty 2003  . Breast enhancement surgery 2007  . Cesarean section 2001 and 2004  . Laparoscopy     twice    Family History  Problem Relation Age of Onset  . Heart disease Mother     MVP  . Anxiety disorder Mother   . Hypertension Brother   . Diabetes Brother     PRE DIABETES  . Obesity Brother   . Allergies Daughter   . Asthma Daughter   . Allergies Son   . Asthma Son   . Kidney disease Maternal Grandmother     uremia    History   Social History  . Marital Status: Married    Spouse Name: N/A    Number of Children: N/A  . Years of Education: N/A   Occupational History  . Not on file.   Social History Main Topics  . Smoking status: Never Smoker   . Smokeless tobacco: Never Used  . Alcohol Use: No     RARELY  . Drug Use: No  .  Sexually Active: Yes    Birth Control/ Protection: Surgical   Other Topics Concern  . Not on file   Social History Narrative  . No narrative on file    Current Outpatient Prescriptions on File Prior to Visit  Medication Sig Dispense Refill  . aspirin 81 MG tablet Take 81 mg by mouth daily.        Marland Kitchen AVAR CLEANSER 10-5 % EMUL       . Biotin 2500 MCG CAPS Take 1 capsule by mouth daily.        . Calcium Citrate-Vitamin D 250-200 MG-UNIT TABS Take 1 tablet by mouth daily.  30 each  11  . Cholecalciferol (VITAMIN D) 2000 UNITS CAPS Take 1 capsule (2,000 Units total) by mouth once.  30 capsule  11  . fexofenadine (ALLEGRA) 180 MG tablet Take 180 mg by mouth daily.        . finasteride (PROSCAR) 5 MG tablet Take 5 mg by mouth daily. Take 1/2 tab daily      . fish oil-omega-3 fatty acids 1000 MG capsule Take 2 g by mouth daily.       . hydrOXYzine (ATARAX) 10 MG tablet Take 10 mg  by mouth as needed.        . methylphenidate (RITALIN) 10 MG tablet Take 1 tablet (10 mg total) by mouth 3 (three) times daily.  90 tablet  0  . mometasone (NASONEX) 50 MCG/ACT nasal spray Place 2 sprays into the nose daily.  17 g  5  . Multiple Vitamin (MULTIVITAMIN) capsule Take 1 capsule by mouth daily.        . nitrofurantoin (MACRODANTIN) 50 MG capsule As needed      . Polysaccharide Iron Complex (POLY-IRON 150 PO) Take 150 mg by mouth 2 (two) times daily.      . ranitidine (ZANTAC) 150 MG tablet Take 1 tablet (150 mg total) by mouth 2 (two) times daily as needed for heartburn.  60 tablet  1  . senna (SENOKOT) 8.6 MG TABS Take 1 tablet by mouth daily.      Marland Kitchen spironolactone (ALDACTONE) 25 MG tablet Take 25 mg by mouth daily. Take 2 tabs daily      . tretinoin (RETIN-A) 0.025 % cream       . ValACYclovir HCl (VALTREX PO) Take by mouth as needed.        . zinc gluconate 50 MG tablet Take 50 mg by mouth daily.      Marland Kitchen DISCONTD: methylphenidate (RITALIN) 10 MG tablet Take 1 tablet (10 mg total) by mouth daily.  30  tablet  0  . DISCONTD: methylphenidate (METADATE CD) 30 MG CR capsule Take 1 capsule (30 mg total) by mouth every morning.  30 capsule  0  . DISCONTD: methylphenidate (METADATE CD) 30 MG CR capsule Take 1 capsule (30 mg total) by mouth every morning. December 2012 rx  30 capsule  0  . DISCONTD: methylphenidate (RITALIN LA) 20 MG 24 hr capsule Take 1 capsule (20 mg total) by mouth daily.  30 capsule  0  . DISCONTD: methylphenidate (RITALIN) 10 MG tablet Take 10 mg by mouth daily. As needed        No Known Allergies  Review of Systems  Review of Systems  Constitutional: Positive for fever and malaise/fatigue.  HENT: Positive for congestion and sore throat.   Eyes: Negative for discharge.  Respiratory: Positive for cough and sputum production. Negative for shortness of breath.   Cardiovascular: Negative for chest pain, palpitations and leg swelling.  Gastrointestinal: Negative for nausea, abdominal pain and diarrhea.  Genitourinary: Negative for dysuria.  Musculoskeletal: Negative for falls.  Skin: Negative for rash.  Neurological: Negative for loss of consciousness and headaches.  Endo/Heme/Allergies: Negative for polydipsia.  Psychiatric/Behavioral: Negative for depression and suicidal ideas. The patient is not nervous/anxious and does not have insomnia.     Objective  BP 92/56  Pulse 72  Temp(Src) 98.7 F (37.1 C) (Temporal)  Ht 5' 5.5" (1.664 m)  Wt 118 lb 12.8 oz (53.887 kg)  BMI 19.47 kg/m2  SpO2 100%  LMP 02/25/2012  Physical Exam  Physical Exam  Constitutional: She is oriented to person, place, and time and well-developed, well-nourished, and in no distress. No distress.  HENT:  Head: Normocephalic and atraumatic.       TMs mildly erythematous and retracted. Oropharynx mildly erythematous  Eyes: Conjunctivae are normal.  Neck: Neck supple. No thyromegaly present.       Posterior auricular enlarged LN b/l  Cardiovascular: Normal rate, regular rhythm and normal  heart sounds.   No murmur heard. Pulmonary/Chest: Effort normal and breath sounds normal. She has no wheezes.  Abdominal: She exhibits no distension and no  mass.  Musculoskeletal: She exhibits no edema.  Lymphadenopathy:    She has cervical adenopathy.  Neurological: She is alert and oriented to person, place, and time.  Skin: Skin is warm and dry. No rash noted. She is not diaphoretic.  Psychiatric: Memory, affect and judgment normal.    Lab Results  Component Value Date   TSH 0.93 02/14/2012   Lab Results  Component Value Date   WBC 3.9* 07/28/2011   HGB 13.5 07/28/2011   HCT 42.0 07/28/2011   MCV 88.4 07/28/2011   PLT 179 07/28/2011   Lab Results  Component Value Date   CREATININE 0.59 05/29/2009   BUN 12 05/29/2009   NA 142 05/29/2009   K 4.5 05/29/2009   CL 107 05/29/2009   CO2 27 05/29/2009   Lab Results  Component Value Date   ALT 11 05/29/2009   AST 11 05/29/2009   ALKPHOS 31* 05/29/2009   BILITOT 1.0 05/29/2009   Lab Results  Component Value Date   CHOL 154 05/29/2009   Lab Results  Component Value Date   HDL 48 05/29/2009   Lab Results  Component Value Date   LDLCALC 97 05/29/2009   Lab Results  Component Value Date   TRIG 46 05/29/2009   Lab Results  Component Value Date   CHOLHDL 3.2 Ratio 05/29/2009     Assessment & Plan  Otitis media Pharyngitis also noted. Strep negative in office. Ears mildly erythematous. Likely viral illness. Patient is encouraged to increase hydration and rest, add Mucinex and is given an rx to use prn if symptoms worsen for Omnicef, Tussionex and Diflucan. Call if no repsonse

## 2012-03-29 ENCOUNTER — Telehealth: Payer: Self-pay | Admitting: *Deleted

## 2012-03-29 NOTE — Telephone Encounter (Signed)
Dr Shirlee Latch reviewed monitor done 02/15/12-03/21/12. Occasional PVCs no other significant arrhythmias. Pt notified.

## 2012-04-10 ENCOUNTER — Ambulatory Visit (INDEPENDENT_AMBULATORY_CARE_PROVIDER_SITE_OTHER): Payer: BC Managed Care – PPO | Admitting: Cardiology

## 2012-04-10 ENCOUNTER — Encounter: Payer: Self-pay | Admitting: Cardiology

## 2012-04-10 VITALS — BP 86/60 | HR 56 | Ht 66.0 in | Wt 119.0 lb

## 2012-04-10 DIAGNOSIS — R002 Palpitations: Secondary | ICD-10-CM

## 2012-04-10 NOTE — Patient Instructions (Signed)
Your physician recommends that you schedule a follow-up appointment as needed with Dr McLean.  

## 2012-04-10 NOTE — Progress Notes (Signed)
PCP: Dr. Abner Greenspan  41 yo returns for evaluation of palpitations.  She has had palpitations in the past, but they have been more frequent for the last couple of months.  She had an extensive past workup by Dr. Mayford Knife (see PMH below), showing a structurally normal heart.  The only arrhythmia that could be detected was PACs.  She will feels her heart "flip-flopping" in her chest.  This will happen a number of times in a row.  It does not beat particularly fast.  She notices this more at rest and not with exercise.  Especially bothersome when lying in bed at night.  No lightheadedness or syncope.  No chest pain.  Excellent exercise tolerance without limitations.  She avoids caffeinated beverages but she does eat chocolate.  Of note, she has been taking Ritalin now for ADHD.  She does not take it every day.  She does not think that the palpitations are more frequent on days that she takes Ritalin.   We did a 3 week event monitor.  She has occasional PVCs but no significant arrhythmia was detected.    Labs (4/13): TSH normal PMH: 1. GERD 2. IBS 3. Interstitial cystitis 4. Palpitations: Extensive past workup with Dr. Mayford Knife.  Event monitors in 2008 and 2011 showed only NSR and PACs.  ETT in 2008 was normal.   ETT-myoview in 11/11 showed 15 METS, EF 59%, no ischemia or infarction.   Echo (11/11) showed EF 60%, no significant valvular abnormality.  3 week event monitor (5/13) with rare PVCs.  5. ADHD 6. H/o UTIs  SH: Married ([place]), 2 children, nonsmoker, rare ETOH.  FH: HTN, mother with MVP, no atrial fibrillation.   Current Outpatient Prescriptions  Medication Sig Dispense Refill  . aspirin 81 MG tablet Take 81 mg by mouth daily.        Marland Kitchen AVAR CLEANSER 10-5 % EMUL       . Biotin 2500 MCG CAPS Take 1 capsule by mouth daily.        . Calcium Citrate-Vitamin D 250-200 MG-UNIT TABS Take 1 tablet by mouth daily.  30 each  11  . Cholecalciferol (VITAMIN D) 2000 UNITS CAPS Take 1 capsule (2,000 Units  total) by mouth once.  30 capsule  11  . fexofenadine (ALLEGRA) 180 MG tablet Take 180 mg by mouth daily.        . finasteride (PROSCAR) 5 MG tablet Take 5 mg by mouth daily. Take 1/2 tab daily      . fish oil-omega-3 fatty acids 1000 MG capsule Take 2 g by mouth daily.       . hydrOXYzine (ATARAX) 10 MG tablet Take 10 mg by mouth as needed.        . methylphenidate (RITALIN) 10 MG tablet Take 1 tablet (10 mg total) by mouth 3 (three) times daily.  90 tablet  0  . mometasone (NASONEX) 50 MCG/ACT nasal spray Place 2 sprays into the nose daily.  17 g  5  . Multiple Vitamin (MULTIVITAMIN) capsule Take 1 capsule by mouth daily.        . nitrofurantoin (MACRODANTIN) 50 MG capsule As needed      . Polysaccharide Iron Complex (POLY-IRON 150 PO) Take 150 mg by mouth 2 (two) times daily.      . ranitidine (ZANTAC) 150 MG tablet Take 1 tablet (150 mg total) by mouth 2 (two) times daily as needed for heartburn.  60 tablet  1  . senna (SENOKOT) 8.6 MG TABS Take  1 tablet by mouth daily.      Marland Kitchen spironolactone (ALDACTONE) 25 MG tablet Take 25 mg by mouth daily. Take 2 tabs daily      . tretinoin (RETIN-A) 0.025 % cream       . ValACYclovir HCl (VALTREX PO) Take by mouth as needed.        . zinc gluconate 50 MG tablet Take 50 mg by mouth daily.      Marland Kitchen DISCONTD: methylphenidate (RITALIN) 10 MG tablet Take 10 mg by mouth 3 (three) times daily.        BP 86/60  Pulse 56  Ht 5\' 6"  (1.676 m)  Wt 53.978 kg (119 lb)  BMI 19.21 kg/m2 General: NAD Neck: No JVD, no thyromegaly or thyroid nodule.  Lungs: Clear to auscultation bilaterally with normal respiratory effort. CV: Nondisplaced PMI.  Heart regular S1/S2, no S3/S4, no murmur.  No peripheral edema.  No carotid bruit.  Normal pedal pulses.  Abdomen: Soft, nontender, no hepatosplenomegaly, no distention.  Neurologic: Alert and oriented x 3.  Psych: Normal affect. Extremities: No clubbing or cyanosis.

## 2012-04-10 NOTE — Assessment & Plan Note (Signed)
These have been more frequent for a few months weeks.  Extensive past cardiac workup has shown structurally normal heart. Avoids caffeine.  TSH was normal.  3 week event monitor showed occasional PVCs (not frequent) and no runs of SVT or atrial fibrillation.  I did not see anything worrisome.  She may be feeling the PVCs.  I would not start her on a beta blocker as the PVCs are not particularly frequent and her BP runs low at baseline.  I suggested that she continue to avoid caffeine and try to get adequate sleep and exercise.  She does not think that Ritalin causes increased palpitations, so I do not see a reason to stop it. She will followup prn.

## 2012-04-11 ENCOUNTER — Telehealth: Payer: Self-pay

## 2012-04-11 ENCOUNTER — Institutional Professional Consult (permissible substitution): Payer: BC Managed Care – PPO | Admitting: Cardiology

## 2012-04-11 ENCOUNTER — Ambulatory Visit (INDEPENDENT_AMBULATORY_CARE_PROVIDER_SITE_OTHER): Payer: BC Managed Care – PPO | Admitting: Family Medicine

## 2012-04-11 ENCOUNTER — Encounter: Payer: Self-pay | Admitting: Family Medicine

## 2012-04-11 VITALS — BP 94/55 | HR 59 | Temp 99.0°F | Ht 65.5 in | Wt 120.8 lb

## 2012-04-11 DIAGNOSIS — M25512 Pain in left shoulder: Secondary | ICD-10-CM

## 2012-04-11 DIAGNOSIS — R41840 Attention and concentration deficit: Secondary | ICD-10-CM

## 2012-04-11 DIAGNOSIS — M25519 Pain in unspecified shoulder: Secondary | ICD-10-CM

## 2012-04-11 HISTORY — DX: Pain in left shoulder: M25.512

## 2012-04-11 MED ORDER — CYCLOBENZAPRINE HCL 10 MG PO TABS: 10.0000 mg | ORAL_TABLET | Freq: Every evening | ORAL | Status: AC | PRN

## 2012-04-11 MED ORDER — METHYLPHENIDATE HCL ER (LA) 30 MG PO CP24: 30.0000 mg | ORAL_CAPSULE | ORAL | Status: DC

## 2012-04-11 MED ORDER — METHYLPHENIDATE HCL 10 MG PO TABS: 10.0000 mg | ORAL_TABLET | Freq: Every day | ORAL | Status: DC

## 2012-04-11 NOTE — Assessment & Plan Note (Signed)
X 4 days, no obvious injury. She is encouraged to avoid heavy lifting but to keep the arm mobilized. Try Aleve in am and given Cyclobenzaprine to use qhs. Consider physiatry referral if pain persists

## 2012-04-11 NOTE — Patient Instructions (Signed)
Shoulder Pain The shoulder is a ball and socket joint. The muscles and tendons (rotator cuff) are what keep the shoulder in its joint and stable. This collection of muscles and tendons holds in the head (ball) of the humerus (upper arm bone) in the fossa (cup) of the scapula (shoulder blade). Today no reason was found for your shoulder pain. Often pain in the shoulder may be treated conservatively with temporary immobilization. For example, holding the shoulder in one place using a sling for rest. Physical therapy may be needed if problems continue. HOME CARE INSTRUCTIONS   Apply ice to the sore area for 15 to 20 minutes, 3 to 4 times per day for the first 2 days. Put the ice in a plastic bag. Place a towel between the bag of ice and your skin.   If you have or were given a shoulder sling and straps, do not remove for as long as directed by your caregiver or until you see a caregiver for a follow-up examination. If you need to remove it to shower or bathe, move your arm as little as possible.   Sleep on several pillows at night to lessen swelling and pain.   Only take over-the-counter or prescription medicines for pain, discomfort, or fever as directed by your caregiver.   Keep any follow-up appointments in order to avoid any type of permanent shoulder disability or chronic pain problems.  SEEK MEDICAL CARE IF:   Pain in your shoulder increases or new pain develops in your arm, hand, or fingers.   Your hand or fingers are colder than your other hand.   You do not obtain pain relief with the medications or your pain becomes worse.  SEEK IMMEDIATE MEDICAL CARE IF:   Your arm, hand, or fingers are numb or tingling.   Your arm, hand, or fingers are swollen, painful, or turn white or blue.   You develop chest pain or shortness of breath.  MAKE SURE YOU:   Understand these instructions.   Will watch your condition.   Will get help right away if you are not doing well or get worse.    Document Released: 07/28/2005 Document Revised: 10/07/2011 Document Reviewed: 10/02/2011 [facility] Patient Information 2012 [place], [place].  Physiatry on [address] with [employer]

## 2012-04-11 NOTE — Assessment & Plan Note (Signed)
Feels the Ritalin is helping but does not last long enough. Will switch to Ritalin LA 30 mg in am and Ritalin 10 mg po qpm prn

## 2012-04-11 NOTE — Telephone Encounter (Signed)
Message copied by Court Joy on Tue Apr 11, 2012 11:30 AM ------      Message from: Danise Edge A      Created: Tue Apr 11, 2012 10:52 AM       Please let her know I sent the Cyclobenzaprine we talked about over right after she left

## 2012-04-11 NOTE — Telephone Encounter (Signed)
Pt informed

## 2012-04-11 NOTE — Progress Notes (Signed)
Patient ID: [name], female   DOB: 1971-07-26, 41 y.o.   MRN: [id number] [name] [id number] 06/22/1971 04/11/2012      Progress Note-Follow Up  Subjective  Chief Complaint  Chief Complaint  Patient presents with  . Follow-up    1 month     HPI  Patient is a 41 year old Caucasian female who is in today for followup on her fatigue and poor concentration. She is fairly pleased with her response to methylphenidate 3 times a day. She notes she has some improvement in her ENT level inability to concentrate for a short period of time after taking each dose. Within a few hours he does wear off however. She does not feel as if there's any correlation with dosing and her palpitations. We'll not currently more physical in the previously had. She's recently undergone a hair transplant and has tolerated that well. Has some mild muscle irritation and headache near the site of but finds minimal. No fevers or chills or complications. She has noted for about 4 days now some left shoulder pain. She denies any injury she is active and exercising less due to her hair transplant she says it hurts on the lateral aspect of the left upper shoulder. No pain radiates down the arm or up into the neck. She feels she slept fine she will morning and uncomfortable and he continues. She notes the pain is worse with movement and she does not choose to raise her arm above 90 otherwise she has increased pain. No similar history of similar pain in the past. No other complaints such as chest pain, palpitations, chronic headache, shortness of breath, fevers, GI or GU complaints noted  Past Medical History  Diagnosis Date  . GERD (gastroesophageal reflux disease)   . IBS (irritable bowel syndrome)   . Interstitial cystitis   . Premature atrial contractions 2012  . Adult ADHD     SYMPTOMS ONLY  . Vitamin d deficiency 07/02/2011  . Poor concentration 07/02/2011  . Multiple allergies 07/02/2011  . Anxiety  07/31/2011  . UTI (lower urinary tract infection) 08/28/2011  . Bronchitis 10/08/2011  . Otitis media 03/16/2012  . Shoulder pain, left 04/11/2012    Past Surgical History  Procedure Date  . Tonsillectomy 1982  . Rhinoplasty 2003  . Breast enhancement surgery 2007  . Cesarean section 2001 and 2004  . Laparoscopy     twice    Family History  Problem Relation Age of Onset  . Heart disease Mother     MVP  . Anxiety disorder Mother   . Hypertension Brother   . Diabetes Brother     PRE DIABETES  . Obesity Brother   . Allergies Daughter   . Asthma Daughter   . Allergies Son   . Asthma Son   . Kidney disease Maternal Grandmother     uremia    History   Social History  . Marital Status: Married    Spouse Name: N/A    Number of Children: N/A  . Years of Education: N/A   Occupational History  . Not on file.   Social History Main Topics  . Smoking status: Never Smoker   . Smokeless tobacco: Never Used  . Alcohol Use: No     RARELY  . Drug Use: No  . Sexually Active: Yes    Birth Control/ Protection: Surgical   Other Topics Concern  . Not on file   Social History Narrative  . No narrative on file  Current Outpatient Prescriptions on File Prior to Visit  Medication Sig Dispense Refill  . aspirin 81 MG tablet Take 81 mg by mouth daily.        Marland Kitchen AVAR CLEANSER 10-5 % EMUL       . Biotin 2500 MCG CAPS Take 1 capsule by mouth daily.        . Calcium Citrate-Vitamin D 250-200 MG-UNIT TABS Take 1 tablet by mouth daily.  30 each  11  . Cholecalciferol (VITAMIN D) 2000 UNITS CAPS Take 1 capsule (2,000 Units total) by mouth once.  30 capsule  11  . fexofenadine (ALLEGRA) 180 MG tablet Take 180 mg by mouth daily.        . finasteride (PROSCAR) 5 MG tablet Take 5 mg by mouth daily. Take 1/2 tab daily      . fish oil-omega-3 fatty acids 1000 MG capsule Take 2 g by mouth daily.       . hydrOXYzine (ATARAX) 10 MG tablet Take 10 mg by mouth as needed.        . mometasone  (NASONEX) 50 MCG/ACT nasal spray Place 2 sprays into the nose daily.  17 g  5  . Multiple Vitamin (MULTIVITAMIN) capsule Take 1 capsule by mouth daily.        . nitrofurantoin (MACRODANTIN) 50 MG capsule As needed      . Polysaccharide Iron Complex (POLY-IRON 150 PO) Take 150 mg by mouth 2 (two) times daily.      . ranitidine (ZANTAC) 150 MG tablet Take 1 tablet (150 mg total) by mouth 2 (two) times daily as needed for heartburn.  60 tablet  1  . senna (SENOKOT) 8.6 MG TABS Take 1 tablet by mouth daily.      Marland Kitchen spironolactone (ALDACTONE) 25 MG tablet Take 25 mg by mouth daily. Take 2 tabs daily      . tretinoin (RETIN-A) 0.025 % cream       . ValACYclovir HCl (VALTREX PO) Take by mouth as needed.        . zinc gluconate 50 MG tablet Take 50 mg by mouth daily.      Marland Kitchen DISCONTD: methylphenidate (RITALIN) 10 MG tablet Take 1 tablet (10 mg total) by mouth 3 (three) times daily.  90 tablet  0    No Known Allergies  Review of Systems  Review of Systems  Constitutional: Positive for malaise/fatigue. Negative for fever.  HENT: Negative for congestion.   Eyes: Negative for discharge.  Respiratory: Negative for shortness of breath.   Cardiovascular: Negative for chest pain, palpitations and leg swelling.  Gastrointestinal: Negative for nausea, abdominal pain and diarrhea.  Genitourinary: Negative for dysuria.  Musculoskeletal: Negative for falls.  Skin: Negative for rash.  Neurological: Negative for loss of consciousness and headaches.  Endo/Heme/Allergies: Negative for polydipsia.  Psychiatric/Behavioral: Negative for depression and suicidal ideas. The patient is not nervous/anxious and does not have insomnia.     Objective  BP 94/55  Pulse 59  Temp(Src) 99 F (37.2 C) (Temporal)  Ht 5' 5.5" (1.664 m)  Wt 120 lb 12.8 oz (54.795 kg)  BMI 19.80 kg/m2  SpO2 99%  LMP 03/28/2012  Physical Exam  Physical Exam  Constitutional: She is oriented to person, place, and time and  well-developed, well-nourished, and in no distress. No distress.  HENT:  Head: Normocephalic and atraumatic.  Eyes: Conjunctivae are normal.  Neck: Neck supple. No thyromegaly present.  Cardiovascular: Normal rate, regular rhythm and normal heart sounds.   No murmur  heard. Pulmonary/Chest: Effort normal and breath sounds normal. She has no wheezes.  Abdominal: She exhibits no distension and no mass.  Musculoskeletal: She exhibits no edema.  Lymphadenopathy:    She has no cervical adenopathy.  Neurological: She is alert and oriented to person, place, and time.  Skin: Skin is warm and dry. No rash noted. She is not diaphoretic.  Psychiatric: Memory, affect and judgment normal.    Lab Results  Component Value Date   TSH 0.93 02/14/2012   Lab Results  Component Value Date   WBC 3.9* 07/28/2011   HGB 13.5 07/28/2011   HCT 42.0 07/28/2011   MCV 88.4 07/28/2011   PLT 179 07/28/2011   Lab Results  Component Value Date   CREATININE 0.59 05/29/2009   BUN 12 05/29/2009   NA 142 05/29/2009   K 4.5 05/29/2009   CL 107 05/29/2009   CO2 27 05/29/2009   Lab Results  Component Value Date   ALT 11 05/29/2009   AST 11 05/29/2009   ALKPHOS 31* 05/29/2009   BILITOT 1.0 05/29/2009   Lab Results  Component Value Date   CHOL 154 05/29/2009   Lab Results  Component Value Date   HDL 48 05/29/2009   Lab Results  Component Value Date   LDLCALC 97 05/29/2009   Lab Results  Component Value Date   TRIG 46 05/29/2009   Lab Results  Component Value Date   CHOLHDL 3.2 Ratio 05/29/2009     Assessment & Plan  Shoulder pain, left X 4 days, no obvious injury. She is encouraged to avoid heavy lifting but to keep the arm mobilized. Try Aleve in am and given Cyclobenzaprine to use qhs. Consider physiatry referral if pain persists  Poor concentration Feels the Ritalin is helping but does not last long enough. Will switch to Ritalin LA 30 mg in am and Ritalin 10 mg po qpm prn

## 2012-05-11 ENCOUNTER — Ambulatory Visit: Payer: BC Managed Care – PPO | Admitting: Family Medicine

## 2012-05-22 ENCOUNTER — Encounter: Payer: Self-pay | Admitting: Cardiology

## 2012-05-23 ENCOUNTER — Ambulatory Visit: Payer: BC Managed Care – PPO | Admitting: Family Medicine

## 2012-06-28 ENCOUNTER — Encounter: Payer: Self-pay | Admitting: Gynecology

## 2012-06-28 ENCOUNTER — Ambulatory Visit (INDEPENDENT_AMBULATORY_CARE_PROVIDER_SITE_OTHER): Payer: BC Managed Care – PPO | Admitting: Gynecology

## 2012-06-28 VITALS — BP 102/68 | Ht 65.25 in | Wt 118.0 lb

## 2012-06-28 DIAGNOSIS — L659 Nonscarring hair loss, unspecified: Secondary | ICD-10-CM

## 2012-06-28 DIAGNOSIS — Z01419 Encounter for gynecological examination (general) (routine) without abnormal findings: Secondary | ICD-10-CM

## 2012-06-28 DIAGNOSIS — N926 Irregular menstruation, unspecified: Secondary | ICD-10-CM

## 2012-06-28 DIAGNOSIS — Z13220 Encounter for screening for lipoid disorders: Secondary | ICD-10-CM

## 2012-06-28 DIAGNOSIS — Z113 Encounter for screening for infections with a predominantly sexual mode of transmission: Secondary | ICD-10-CM

## 2012-06-28 LAB — COMPREHENSIVE METABOLIC PANEL
ALT: 12 U/L (ref 0–35)
AST: 16 U/L (ref 0–37)
CO2: 27 mEq/L (ref 19–32)
Chloride: 105 mEq/L (ref 96–112)
Sodium: 140 mEq/L (ref 135–145)
Total Bilirubin: 1 mg/dL (ref 0.3–1.2)
Total Protein: 6.6 g/dL (ref 6.0–8.3)

## 2012-06-28 LAB — ESTRADIOL: Estradiol: 96.1 pg/mL

## 2012-06-28 LAB — CBC WITH DIFFERENTIAL/PLATELET
Lymphocytes Relative: 34 % (ref 12–46)
Lymphs Abs: 1.4 10*3/uL (ref 0.7–4.0)
Neutrophils Relative %: 58 % (ref 43–77)
Platelets: 164 10*3/uL (ref 150–400)
RBC: 4.31 MIL/uL (ref 3.87–5.11)
WBC: 3.9 10*3/uL — ABNORMAL LOW (ref 4.0–10.5)

## 2012-06-28 LAB — LIPID PANEL
Cholesterol: 180 mg/dL (ref 0–200)
VLDL: 12 mg/dL (ref 0–40)

## 2012-06-28 LAB — TSH: TSH: 1.577 u[IU]/mL (ref 0.350–4.500)

## 2012-06-28 LAB — FOLLICLE STIMULATING HORMONE: FSH: 11.2 m[IU]/mL

## 2012-06-28 LAB — PROLACTIN: Prolactin: 3.9 ng/mL

## 2012-06-28 MED ORDER — FLUCONAZOLE 150 MG PO TABS: 150.0000 mg | ORAL_TABLET | Freq: Once | ORAL | Status: AC

## 2012-06-28 NOTE — Patient Instructions (Signed)
Follow up for ultrasound as scheduled. The office will contact you with lab results.

## 2012-06-28 NOTE — Progress Notes (Signed)
[name] May 30, 1971 [id number]        41 y.o.  [id number] for annual exam.  With several issues noted below.  Past medical history,surgical history, medications, allergies, family history and social history were all reviewed and documented in the EPIC chart. ROS:  Was performed and pertinent positives and negatives are included in the history.  Exam: [name] assistant Filed Vitals:   06/28/12 1220  BP: 102/68  Height: 5' 5.25" (1.657 m)  Weight: 118 lb (53.524 kg)   General appearance  Normal Skin grossly normal Head/Neck normal with no cervical or supraclavicular adenopathy thyroid normal Lungs  clear Cardiac RR, without RMG Abdominal  soft, nontender, without masses, organomegaly or hernia Breasts  examined lying and sitting without masses, retractions, discharge or axillary adenopathy.  Bilateral implants noted. Pelvic  Ext/BUS/vagina  normal with light staining  Cervix  normal GC/Chlamydia  Uterus  anteverted, normal size, shape and contour, midline and mobile nontender   Adnexa  Without masses or tenderness    Anus and perineum  normal   Rectovaginal  normal sphincter tone without palpated masses or tenderness.    Assessment/Plan:  41 y.o. [id number] female for annual exam, history tubal sterilization.   1. Irregular menses. Patient has past history of menorrhagia previously evaluated to include negative hormone levels and sonohysterogram. Her periods now become light but more irregular where she will have skips as well as more frequent every 2 week bleeding.  We'll recheck labs TSH FSH prolactin. Schedule sonohysterogram rule out intracavitary abnormalities. 2. Alopecia. Actively being seen by dermatology at [facility]. 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 Unsure androgen levels were checked. She has no other signs or symptoms of masculinization such as chest hair pubic hair changes weight gain acne facial hair or clitoral changes. We'll check baseline  testosterone both free and total DHEAS and 17 hydroxyprogesterone. We'll also check ANA. 3. STD screening. No known exposure wants to be screened. GC/Chlamydia hepatitis B hepatitis C HIV RPR ordered. 4. Pap smear. Last Pap smear 2012. No history of abnormal Pap smears. No past prevent a day. We'll plan every 3-5 your screening per current screening guidelines. 5. Dark stools. Is taking extra iron. Only has been over the last week or 2. Stool guaiac card check given. Patient will mail back. If negative and stool color clears we'll follow. If positive or stool color continue start will refer to GI. 6. Vaginal dryness over the last several months. No dyspareunia itching discharge or odor. Will cover with Diflucan 150x1 dose for subclinical yeast and check baseline hormone labs as per above and also in estradiol level. 7. Mammography. Patient had mammogram May 2013. Continue with annual mammogram. SBE monthly reviewed. 8. Health maintenance. Baseline CBC lipid profile comprehensive metabolic panel urinalysis ordered with above lab work. Follow up for sonohysterogram and lab work results.    Dara Lords MD, 1:57 PM 06/28/2012

## 2012-06-29 ENCOUNTER — Encounter: Payer: Self-pay | Admitting: Gynecology

## 2012-06-29 LAB — RPR

## 2012-06-29 LAB — DHEA-SULFATE: DHEA-SO4: 146 ug/dL (ref 35–430)

## 2012-06-29 LAB — TESTOSTERONE, FREE, TOTAL, SHBG
Testosterone, Free: 6 pg/mL (ref 0.6–6.8)
Testosterone-% Free: 1 % (ref 0.4–2.4)

## 2012-06-29 LAB — HEPATITIS B SURFACE ANTIGEN: Hepatitis B Surface Ag: NEGATIVE

## 2012-06-29 LAB — HEPATITIS C ANTIBODY: HCV Ab: NEGATIVE

## 2012-06-30 ENCOUNTER — Other Ambulatory Visit: Payer: Self-pay | Admitting: Family Medicine

## 2012-06-30 ENCOUNTER — Telehealth: Payer: Self-pay | Admitting: Family Medicine

## 2012-06-30 DIAGNOSIS — L659 Nonscarring hair loss, unspecified: Secondary | ICD-10-CM

## 2012-06-30 NOTE — Telephone Encounter (Signed)
Please call Dr Orlie Dakin office next week and see if they have anyone in [place] they can recommend to handle hair loss

## 2012-06-30 NOTE — Telephone Encounter (Signed)
So I am fine to refer her to [facility] with elise I just need to know what specialty she is to make the referral in the computer. Dermatology vs Plastics etc

## 2012-06-30 NOTE — Telephone Encounter (Signed)
SW patient, she is going to call me back with another physician's name.

## 2012-06-30 NOTE — Telephone Encounter (Signed)
LM for patient to call me back. I called [facility] & Dr Corky Downs is not accepting new patients. The schedulers did say that any of the other doctors in the practice could see her for hair loss.

## 2012-06-30 NOTE — Telephone Encounter (Signed)
Patient called back, she has been unable to find anyone else that specializes in hair loss. Patient would like to know if Dr Abner Greenspan can call Dr Orlie Dakin office [phone] to see if Dr Corky Downs recommends someone else that would see patient for hair loss. Advised patient that there is a message on Dr Orlie Dakin office line that she is out of the office til next week and that calls are not being returned until then. Patient understands and appreciates anything that Dr Abner Greenspan can find out.

## 2012-06-30 NOTE — Telephone Encounter (Signed)
Patient is requesting a referral to Sharman Crate at [facility]. One of her specializes is hair loss.

## 2012-06-30 NOTE — Progress Notes (Signed)
Opened in error

## 2012-07-05 ENCOUNTER — Encounter: Payer: Self-pay | Admitting: Gynecology

## 2012-07-05 ENCOUNTER — Ambulatory Visit (INDEPENDENT_AMBULATORY_CARE_PROVIDER_SITE_OTHER): Payer: BC Managed Care – PPO | Admitting: Gynecology

## 2012-07-05 ENCOUNTER — Ambulatory Visit (INDEPENDENT_AMBULATORY_CARE_PROVIDER_SITE_OTHER): Payer: BC Managed Care – PPO

## 2012-07-05 ENCOUNTER — Other Ambulatory Visit: Payer: Self-pay | Admitting: Gynecology

## 2012-07-05 DIAGNOSIS — N921 Excessive and frequent menstruation with irregular cycle: Secondary | ICD-10-CM

## 2012-07-05 DIAGNOSIS — N938 Other specified abnormal uterine and vaginal bleeding: Secondary | ICD-10-CM

## 2012-07-05 DIAGNOSIS — N926 Irregular menstruation, unspecified: Secondary | ICD-10-CM

## 2012-07-05 DIAGNOSIS — N839 Noninflammatory disorder of ovary, fallopian tube and broad ligament, unspecified: Secondary | ICD-10-CM

## 2012-07-05 DIAGNOSIS — N83209 Unspecified ovarian cyst, unspecified side: Secondary | ICD-10-CM

## 2012-07-05 DIAGNOSIS — N920 Excessive and frequent menstruation with regular cycle: Secondary | ICD-10-CM

## 2012-07-05 NOTE — Patient Instructions (Signed)
Keep menstrual calendar. If menses become regular them we will follow. If irregularity continues then call me. Office will contact you with the biopsy results within the next several days.

## 2012-07-05 NOTE — Progress Notes (Signed)
Patient presents for sonohysterogram due to irregular menses.  Ultrasound shows uterus overall normal with an endometrial echo of 5.7 mm. Right ovary with thin walls echo-free cyst 39 x 30 x 23 mm avascular. Left ovary normal. Cul-de-sac negative. Sonohysterogram performed, sterile technique, easy catheter introduction, distention with no abnormalities. Endometrial sample taken. Patient tolerated well.  Assessment and plan: Menstrual irregularity. Ultrasound negative with echo-free, avascular cyst right ovary. 31 mm mean. History of physiologic ovarian cysts in the past. Without pain. Lab studies all normal to include TSH FSH estradiol prolactin. Will follow up for biopsy results. Assuming negative we'll plan expectant management over the next several months. If menses regulate them we'll follow. If irregularity continues she will call me. I reviewed options to include low-dose oral contraceptives, Mirena IUD intermittent progesterone withdrawal. Her STD screens were all negative and her androgen studies normal. I reviewed the ovarian cyst and whether we should follow up with ultrasound. If she does have a history of this in the past is asymptomatic I do not think follow up mandated at this point and she agrees with this.

## 2012-07-17 NOTE — Telephone Encounter (Signed)
I called Dr Orlie Dakin office back & spoke with [facility]. She said they do hair loss clinics which do require a referral to be faxed to them [phone]. LM for patient to CB to see if she would like for [place] to fax the referral to them for the clinic.

## 2012-07-19 NOTE — Telephone Encounter (Signed)
SW patient, patient is agreeable to going to the clinic, faxed referral.

## 2012-09-01 ENCOUNTER — Other Ambulatory Visit: Payer: Self-pay | Admitting: Family Medicine

## 2012-09-01 MED ORDER — FLUCONAZOLE 150 MG PO TABS: 150.0000 mg | ORAL_TABLET | Freq: Every day | ORAL | Status: DC

## 2012-09-01 NOTE — Telephone Encounter (Signed)
Discharge (clearish white) itchy X 1 day

## 2012-09-01 NOTE — Telephone Encounter (Signed)
OK to rx Diflucan 150 mg tab 1 tab po qd x 3 days as needed, Disp #3 with 1 rf

## 2012-09-01 NOTE — Telephone Encounter (Signed)
Patient is requesting refill today. Please call patient

## 2013-03-16 ENCOUNTER — Other Ambulatory Visit: Payer: Self-pay

## 2013-03-16 DIAGNOSIS — Z1231 Encounter for screening mammogram for malignant neoplasm of breast: Secondary | ICD-10-CM

## 2013-04-03 ENCOUNTER — Ambulatory Visit
Admission: RE | Admit: 2013-04-03 | Discharge: 2013-04-03 | Disposition: A | Discharge status: Home / Self Care | Payer: BC Managed Care – PPO | Source: Ambulatory Visit

## 2013-04-03 DIAGNOSIS — Z1231 Encounter for screening mammogram for malignant neoplasm of breast: Secondary | ICD-10-CM

## 2013-04-09 ENCOUNTER — Encounter: Payer: Self-pay | Admitting: Family Medicine

## 2013-04-09 ENCOUNTER — Ambulatory Visit (INDEPENDENT_AMBULATORY_CARE_PROVIDER_SITE_OTHER): Payer: BC Managed Care – PPO | Admitting: Family Medicine

## 2013-04-09 VITALS — BP 98/60 | HR 60 | Temp 98.4°F | Ht 66.0 in | Wt 116.1 lb

## 2013-04-09 DIAGNOSIS — E559 Vitamin D deficiency, unspecified: Secondary | ICD-10-CM

## 2013-04-09 DIAGNOSIS — F341 Dysthymic disorder: Secondary | ICD-10-CM

## 2013-04-09 DIAGNOSIS — F909 Attention-deficit hyperactivity disorder, unspecified type: Secondary | ICD-10-CM

## 2013-04-09 DIAGNOSIS — R5383 Other fatigue: Secondary | ICD-10-CM

## 2013-04-09 DIAGNOSIS — R5381 Other malaise: Secondary | ICD-10-CM

## 2013-04-09 DIAGNOSIS — F329 Major depressive disorder, single episode, unspecified: Secondary | ICD-10-CM

## 2013-04-09 DIAGNOSIS — Z79899 Other long term (current) drug therapy: Secondary | ICD-10-CM

## 2013-04-09 LAB — RENAL FUNCTION PANEL
Albumin: 4.5 g/dL (ref 3.5–5.2)
BUN: 14 mg/dL (ref 6–23)
CO2: 30 mEq/L (ref 19–32)
Creat: 0.62 mg/dL (ref 0.50–1.10)
Glucose, Bld: 99 mg/dL (ref 70–99)

## 2013-04-09 LAB — HEPATIC FUNCTION PANEL
ALT: 9 U/L (ref 0–35)
AST: 12 U/L (ref 0–37)
Albumin: 4.5 g/dL (ref 3.5–5.2)
Alkaline Phosphatase: 34 U/L — ABNORMAL LOW (ref 39–117)
Total Protein: 6.7 g/dL (ref 6.0–8.3)

## 2013-04-09 LAB — CBC
MCV: 85.2 fL (ref 78.0–100.0)
Platelets: 170 10*3/uL (ref 150–400)
RBC: 4.39 MIL/uL (ref 3.87–5.11)
RDW: 13 % (ref 11.5–15.5)
WBC: 4.9 10*3/uL (ref 4.0–10.5)

## 2013-04-09 LAB — FERRITIN: Ferritin: 31 ng/mL (ref 10–291)

## 2013-04-09 LAB — SEDIMENTATION RATE: Sed Rate: 1 mm/hr (ref 0–22)

## 2013-04-09 NOTE — Patient Instructions (Addendum)
Alprazolam/Xanax for anxiety attacks Lexapro/Escitalopram for ongoing stressors  Depression, Adult Depression refers to feeling sad, low, down in the dumps, blue, gloomy, or empty. In general, there are two kinds of depression: 1. Depression that we all experience from time to time because of upsetting life experiences, including the loss of a job or the ending of a relationship (normal sadness or normal grief). This kind of depression is considered normal, is short lived, and resolves within a few days to 2 weeks. (Depression experienced after the loss of a loved one is called bereavement. Bereavement often lasts longer than 2 weeks but normally gets better with time.) 2. Clinical depression, which lasts longer than normal sadness or normal grief or interferes with your ability to function at home, at work, and in school. It also interferes with your personal relationships. It affects almost every aspect of your life. Clinical depression is an illness. Symptoms of depression also can be caused by conditions other than normal sadness and grief or clinical depression. Examples of these conditions are listed as follows:  Physical illness Some physical illnesses, including underactive thyroid gland (hypothyroidism), severe anemia, specific types of cancer, diabetes, uncontrolled seizures, heart and lung problems, strokes, and chronic pain are commonly associated with symptoms of depression.  Side effects of some prescription medicine In some people, certain types of prescription medicine can cause symptoms of depression.  Substance abuse Abuse of alcohol and illicit drugs can cause symptoms of depression. SYMPTOMS Symptoms of normal sadness and normal grief include the following:  Feeling sad or crying for short periods of time.  Not caring about anything (apathy).  Difficulty sleeping or sleeping too much.  No longer able to enjoy the things you used to enjoy.  Desire to be by oneself all the  time (social isolation).  Lack of energy or motivation.  Difficulty concentrating or remembering.  Change in appetite or weight.  Restlessness or agitation. Symptoms of clinical depression include the same symptoms of normal sadness or normal grief and also the following symptoms:  Feeling sad or crying all the time.  Feelings of guilt or worthlessness.  Feelings of hopelessness or helplessness.  Thoughts of suicide or the desire to harm yourself (suicidal ideation).  Loss of touch with reality (psychotic symptoms). Seeing or hearing things that are not real (hallucinations) or having false beliefs about your life or the people around you (delusions and paranoia). DIAGNOSIS  The diagnosis of clinical depression usually is based on the severity and duration of the symptoms. Your caregiver also will ask you questions about your medical history and substance use to find out if physical illness, use of prescription medicine, or substance abuse is causing your depression. Your caregiver also may order blood tests. TREATMENT  Typically, normal sadness and normal grief do not require treatment. However, sometimes antidepressant medicine is prescribed for bereavement to ease the depressive symptoms until they resolve. The treatment for clinical depression depends on the severity of your symptoms but typically includes antidepressant medicine, counseling with a mental health professional, or a combination of both. Your caregiver will help to determine what treatment is best for you. Depression caused by physical illness usually goes away with appropriate medical treatment of the illness. If prescription medicine is causing depression, talk with your caregiver about stopping the medicine, decreasing the dose, or substituting another medicine. Depression caused by abuse of alcohol or illicit drugs abuse goes away with abstinence from these substances. Some adults need professional help in order to  stop drinking  or using drugs. SEEK IMMEDIATE CARE IF:  You have thoughts about hurting yourself or others.  You lose touch with reality (have psychotic symptoms).  You are taking medicine for depression and have a serious side effect. FOR MORE INFORMATION National Alliance on Mental Illness: www.nami.[employer]: http://www.maynard.net/ Document Released: 10/15/2000 Document Revised: 04/18/2012 Document Reviewed: 01/17/2012 [facility] Patient Information 2014 [place], [place].

## 2013-04-10 ENCOUNTER — Encounter: Payer: Self-pay | Admitting: Family Medicine

## 2013-04-10 LAB — VITAMIN D 25 HYDROXY (VIT D DEFICIENCY, FRACTURES): Vit D, 25-Hydroxy: 31 ng/mL (ref 30–89)

## 2013-04-10 NOTE — Assessment & Plan Note (Signed)
Gets some benefit from Ritalin but not complete focus, hesitant to increase med at this time due to increased anxiety, will consider in future as needed

## 2013-04-10 NOTE — Assessment & Plan Note (Signed)
Recheck level at patient request
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
## 2013-04-12 ENCOUNTER — Telehealth: Payer: Self-pay | Admitting: *Deleted

## 2013-04-12 MED ORDER — ESCITALOPRAM OXALATE 20 MG PO TABS: ORAL_TABLET | ORAL | Status: DC

## 2013-04-12 MED ORDER — ALPRAZOLAM ER 0.5 MG PO TB24: 0.5000 mg | ORAL_TABLET | Freq: Two times a day (BID) | ORAL | Status: DC | PRN

## 2013-04-12 NOTE — Telephone Encounter (Signed)
Per order from Provider, called in Xanax XR 0.5mg  1 tablet twice daily as needed for anxiety. Lexapro 20mg  1/2 tablet daily x 7 days then 1 tablet daily thereafter. Rxs called to Rob at [facility].

## 2013-04-24 ENCOUNTER — Ambulatory Visit: Payer: BC Managed Care – PPO | Admitting: Family Medicine

## 2013-04-27 ENCOUNTER — Ambulatory Visit (INDEPENDENT_AMBULATORY_CARE_PROVIDER_SITE_OTHER): Payer: BC Managed Care – PPO | Admitting: Family Medicine

## 2013-04-27 ENCOUNTER — Encounter: Payer: Self-pay | Admitting: Family Medicine

## 2013-04-27 VITALS — BP 102/58 | HR 70 | Temp 98.5°F | Ht 65.5 in | Wt 118.2 lb

## 2013-04-27 DIAGNOSIS — F419 Anxiety disorder, unspecified: Secondary | ICD-10-CM

## 2013-04-27 DIAGNOSIS — F909 Attention-deficit hyperactivity disorder, unspecified type: Secondary | ICD-10-CM

## 2013-04-27 DIAGNOSIS — R41840 Attention and concentration deficit: Secondary | ICD-10-CM

## 2013-04-27 DIAGNOSIS — K219 Gastro-esophageal reflux disease without esophagitis: Secondary | ICD-10-CM

## 2013-04-27 DIAGNOSIS — F341 Dysthymic disorder: Secondary | ICD-10-CM

## 2013-04-27 MED ORDER — ALPRAZOLAM ER 0.5 MG PO TB24: ORAL_TABLET | ORAL | Status: DC

## 2013-04-27 MED ORDER — METHYLPHENIDATE HCL ER (LA) 30 MG PO CP24: 30.0000 mg | ORAL_CAPSULE | ORAL | Status: DC

## 2013-04-27 MED ORDER — METHYLPHENIDATE HCL 10 MG PO TABS: 10.0000 mg | ORAL_TABLET | Freq: Every day | ORAL | Status: DC

## 2013-04-27 NOTE — Assessment & Plan Note (Addendum)
Good response to Xanax XR has had to increase to 1 mg in am and 0.5 mg in pm is working weekly with counselor. She feels her depression and anxiety are improving some. Will consider Lexapro  Or another daily med if symtptoms persist

## 2013-04-27 NOTE — Patient Instructions (Addendum)
Digestive Advantage probiotics Calciu, magnesium, zinc daily Try 2 Tums at bed Gastroesophageal Reflux Disease, Adult Gastroesophageal reflux disease (GERD) happens when acid from your stomach flows up into the esophagus. When acid comes in contact with the esophagus, the acid causes soreness (inflammation) in the esophagus. Over time, GERD may create small holes (ulcers) in the lining of the esophagus. CAUSES   Increased body weight. This puts pressure on the stomach, making acid rise from the stomach into the esophagus.  Smoking. This increases acid production in the stomach.  Drinking alcohol. This causes decreased pressure in the lower esophageal sphincter (valve or ring of muscle between the esophagus and stomach), allowing acid from the stomach into the esophagus.  Late evening meals and a full stomach. This increases pressure and acid production in the stomach.  A malformed lower esophageal sphincter. Sometimes, no cause is found. SYMPTOMS   Burning pain in the lower part of the mid-chest behind the breastbone and in the mid-stomach area. This may occur twice a week or more often.  Trouble swallowing.  Sore throat.  Dry cough.  Asthma-like symptoms including chest tightness, shortness of breath, or wheezing. DIAGNOSIS  Your caregiver may be able to diagnose GERD based on your symptoms. In some cases, X-rays and other tests may be done to check for complications or to check the condition of your stomach and esophagus. TREATMENT  Your caregiver may recommend over-the-counter or prescription medicines to help decrease acid production. Ask your caregiver before starting or adding any new medicines.  HOME CARE INSTRUCTIONS   Change the factors that you can control. Ask your caregiver for guidance concerning weight loss, quitting smoking, and alcohol consumption.  Avoid foods and drinks that make your symptoms worse, such as:  Caffeine or alcoholic  drinks.  Chocolate.  Peppermint or mint flavorings.  Garlic and onions.  Spicy foods.  Citrus fruits, such as oranges, lemons, or limes.  Tomato-based foods such as sauce, chili, salsa, and pizza.  Fried and fatty foods.  Avoid lying down for the 3 hours prior to your bedtime or prior to taking a nap.  Eat small, frequent meals instead of large meals.  Wear loose-fitting clothing. Do not wear anything tight around your waist that causes pressure on your stomach.  Raise the head of your bed 6 to 8 inches with wood blocks to help you sleep. Extra pillows will not help.  Only take over-the-counter or prescription medicines for pain, discomfort, or fever as directed by your caregiver.  Do not take aspirin, ibuprofen, or other nonsteroidal anti-inflammatory drugs (NSAIDs). SEEK IMMEDIATE MEDICAL CARE IF:   You have pain in your arms, neck, jaw, teeth, or back.  Your pain increases or changes in intensity or duration.  You develop nausea, vomiting, or sweating (diaphoresis).  You develop shortness of breath, or you faint.  Your vomit is green, yellow, black, or looks like coffee grounds or blood.  Your stool is red, bloody, or black. These symptoms could be signs of other problems, such as heart disease, gastric bleeding, or esophageal bleeding. MAKE SURE YOU:   Understand these instructions.  Will watch your condition.  Will get help right away if you are not doing well or get worse. Document Released: 07/28/2005 Document Revised: 01/10/2012 Document Reviewed: 05/07/2011 [facility] Patient Information 2014 [place], [place].

## 2013-04-29 ENCOUNTER — Encounter: Payer: Self-pay | Admitting: Family Medicine

## 2013-04-29 NOTE — Assessment & Plan Note (Signed)
Refill given on Methylphenidate.

## 2013-04-29 NOTE — Assessment & Plan Note (Signed)
Cough possibly related. Try 2 Tums qhs and avoid offending foods.

## 2013-04-29 NOTE — Progress Notes (Signed)
Patient ID: [name], female   DOB: 1971-10-24, 42 y.o.   MRN: [id number] AUDREA BOLTE [id number] [birth year] 04/29/2013      Progress Note-Follow Up  Subjective  Chief Complaint  Chief Complaint  Patient presents with  . Follow-up    2 week    HPI  Patient is a 42 year old Caucasian female in today for followup on her anxiety and depression. She feels the Xanax XR is been helpful but she has needed a full milligram in the morning and half a milligram in the evening to manage her paternal and rashes and anxiety. She continues to struggle with anhedonia but does believe it is improving somewhat. Still uses the methylphenidate for energy and concentration with good results. No new complaints. No suicidal ideation so far is chosen not to start the SSRI. No CP/palp/SOB. Slight cough noted, no congestion or production.   Past Medical History  Diagnosis Date  . GERD (gastroesophageal reflux disease)   . IBS (irritable bowel syndrome)   . Interstitial cystitis   . Premature atrial contractions 2012  . Adult ADHD     SYMPTOMS ONLY  . Vitamin D deficiency 07/02/2011  . Poor concentration 07/02/2011  . Multiple allergies 07/02/2011  . Anxiety 07/31/2011  . UTI (lower urinary tract infection) 08/28/2011  . Bronchitis 10/08/2011  . Otitis media 03/16/2012  . Shoulder pain, left 04/11/2012  . Alopecia   . Anxiety and depression 07/31/2011    Past Surgical History  Procedure Laterality Date  . Tonsillectomy  1982  . Rhinoplasty  2003  . Breast enhancement surgery  2007  . Cesarean section  2001 and 2004  . Laparoscopy      twice  . Tubal ligation      Family History  Problem Relation Age of Onset  . Heart disease Mother     MVP  . Anxiety disorder Mother   . Hypertension Brother   . Diabetes Brother     PRE DIABETES  . Obesity Brother   . Allergies Daughter   . Asthma Daughter   . Allergies Son   . Asthma Son   . Kidney disease Maternal Grandmother     uremia     History   Social History  . Marital Status: Married    Spouse Name: N/A    Number of Children: N/A  . Years of Education: N/A   Occupational History  . Not on file.   Social History Main Topics  . Smoking status: Never Smoker   . Smokeless tobacco: Never Used  . Alcohol Use: No     Comment: RARELY  . Drug Use: No  . Sexually Active: Yes    Birth Control/ Protection: Surgical   Other Topics Concern  . Not on file   Social History Narrative  . No narrative on file    Current Outpatient Prescriptions on File Prior to Visit  Medication Sig Dispense Refill  . aspirin 81 MG tablet Take 81 mg by mouth daily.        Marland Kitchen AVAR CLEANSER 10-5 % EMUL       . Biotin 2500 MCG CAPS Take 1 capsule by mouth daily.        . Calcium Citrate-Vitamin D 250-200 MG-UNIT TABS Take 1 tablet by mouth daily.  30 each  11  . Cholecalciferol (VITAMIN D) 2000 UNITS CAPS Take 1 capsule (2,000 Units total) by mouth once.  30 capsule  11  . escitalopram (LEXAPRO) 20 MG tablet Take 1/2 tablet  by mouth daily for 7 days then increase to 1 tablet daily thereafter.  30 tablet  0  . fexofenadine (ALLEGRA) 180 MG tablet Take 180 mg by mouth daily.        . finasteride (PROSCAR) 5 MG tablet Take 5 mg by mouth daily. Take 1/2 tab daily      . fish oil-omega-3 fatty acids 1000 MG capsule Take 2 g by mouth daily.       . hydrOXYzine (ATARAX) 10 MG tablet Take 10 mg by mouth as needed.        . mometasone (NASONEX) 50 MCG/ACT nasal spray Place 2 sprays into the nose daily.  17 g  5  . Multiple Vitamin (MULTIVITAMIN) capsule Take 1 capsule by mouth daily.        . nitrofurantoin (MACRODANTIN) 50 MG capsule As needed      . Polysaccharide Iron Complex (POLY-IRON 150 PO) Take 150 mg by mouth 2 (two) times daily.      Marland Kitchen senna (SENOKOT) 8.6 MG TABS Take 1 tablet by mouth daily.      Marland Kitchen spironolactone (ALDACTONE) 25 MG tablet Take 25 mg by mouth daily. Take 2 tabs daily      . tretinoin (RETIN-A) 0.025 % cream       .  ValACYclovir HCl (VALTREX PO) Take by mouth as needed.        . zinc gluconate 50 MG tablet Take 50 mg by mouth daily.      . ranitidine (ZANTAC) 150 MG tablet Take 1 tablet (150 mg total) by mouth 2 (two) times daily as needed for heartburn.  60 tablet  1   No current facility-administered medications on file prior to visit.    No Known Allergies  Review of Systems  Review of Systems  Constitutional: Negative for fever and malaise/fatigue.  HENT: Negative for congestion.   Eyes: Negative for discharge.  Respiratory: Negative for shortness of breath.   Cardiovascular: Negative for chest pain, palpitations and leg swelling.  Gastrointestinal: Negative for nausea, abdominal pain and diarrhea.  Genitourinary: Negative for dysuria.  Musculoskeletal: Negative for falls.  Skin: Negative for rash.  Neurological: Negative for loss of consciousness and headaches.  Endo/Heme/Allergies: Negative for polydipsia.  Psychiatric/Behavioral: Negative for depression and suicidal ideas. The patient is nervous/anxious. The patient does not have insomnia.     Objective  BP 102/58  Pulse 70  Temp(Src) 98.5 F (36.9 C) (Oral)  Ht 5' 5.5" (1.664 m)  Wt 118 lb 3.2 oz (53.615 kg)  BMI 19.36 kg/m2  SpO2 97%  LMP 03/19/2013  Physical Exam  Physical Exam  Constitutional: She is oriented to person, place, and time and well-developed, well-nourished, and in no distress. No distress.  HENT:  Head: Normocephalic and atraumatic.  Eyes: Conjunctivae are normal.  Neck: Neck supple. No thyromegaly present.  Cardiovascular: Normal rate, regular rhythm and normal heart sounds.  Exam reveals no gallop.   No murmur heard. Pulmonary/Chest: Effort normal and breath sounds normal. She has no wheezes.  Abdominal: She exhibits no distension and no mass.  Musculoskeletal: She exhibits no edema.  Lymphadenopathy:    She has no cervical adenopathy.  Neurological: She is alert and oriented to person, place, and  time.  Skin: Skin is warm and dry. No rash noted. She is not diaphoretic.  Psychiatric: Memory, affect and judgment normal.    Lab Results  Component Value Date   TSH 1.473 04/09/2013   Lab Results  Component Value Date  WBC 4.9 04/09/2013   HGB 12.6 04/09/2013   HCT 37.4 04/09/2013   MCV 85.2 04/09/2013   PLT 170 04/09/2013   Lab Results  Component Value Date   CREATININE 0.62 04/09/2013   BUN 14 04/09/2013   NA 141 04/09/2013   K 4.2 04/09/2013   CL 105 04/09/2013   CO2 30 04/09/2013   Lab Results  Component Value Date   ALT 9 04/09/2013   AST 12 04/09/2013   ALKPHOS 34* 04/09/2013   BILITOT 0.6 04/09/2013   Lab Results  Component Value Date   CHOL 180 06/28/2012   Lab Results  Component Value Date   HDL 54 06/28/2012   Lab Results  Component Value Date   LDLCALC 114* 06/28/2012   Lab Results  Component Value Date   TRIG 58 06/28/2012   Lab Results  Component Value Date   CHOLHDL 3.3 06/28/2012     Assessment & Plan  Anxiety and depression Good response to Xanax XR has had to increase to 1 mg in am and 0.5 mg in pm is working weekly with counselor. She feels her depression and anxiety are improving some. Will consider Lexapro  Or another daily med if symtptoms persist  Adult ADHD Refill given on Methylphenidate.   GERD (gastroesophageal reflux disease) Cough possibly related. Try 2 Tums qhs and avoid offending foods.

## 2013-06-07 ENCOUNTER — Encounter: Payer: Self-pay | Admitting: Family Medicine

## 2013-06-07 ENCOUNTER — Ambulatory Visit (INDEPENDENT_AMBULATORY_CARE_PROVIDER_SITE_OTHER): Payer: BC Managed Care – PPO | Admitting: Family Medicine

## 2013-06-07 VITALS — BP 90/60 | HR 59 | Temp 98.0°F | Resp 14 | Ht 65.5 in | Wt 115.1 lb

## 2013-06-07 DIAGNOSIS — F909 Attention-deficit hyperactivity disorder, unspecified type: Secondary | ICD-10-CM

## 2013-06-07 DIAGNOSIS — F329 Major depressive disorder, single episode, unspecified: Secondary | ICD-10-CM

## 2013-06-07 DIAGNOSIS — F341 Dysthymic disorder: Secondary | ICD-10-CM

## 2013-06-07 MED ORDER — ALPRAZOLAM ER 0.5 MG PO TB24: ORAL_TABLET | ORAL | Status: DC

## 2013-06-10 ENCOUNTER — Encounter: Payer: Self-pay | Admitting: Family Medicine

## 2013-06-10 NOTE — Progress Notes (Signed)
Patient ID: [name], female   DOB: 09-06-1971, 42 y.o.   MRN: [id number] [name] [id number] 1971/05/09 06/10/2013      Progress Note-Follow Up  Subjective  Chief Complaint  Chief Complaint  Patient presents with  . Anxiety    Pt here for follow up.    HPI  Patient is a 42 year old Caucasian female who is here today in followup. Overall she's doing well but she has had a new development. Her stressors and depression had been improving but she recently had some trouble in her marriage. She continues with counseling and reports no suicidal ideation but does feel she needs to maintain her medications at the current level. No recent illness. No palpitations, chest pain, shortness of breath, GI or GU complaints  Past Medical History  Diagnosis Date  . GERD (gastroesophageal reflux disease)   . IBS (irritable bowel syndrome)   . Interstitial cystitis   . Premature atrial contractions 2012  . Adult ADHD     SYMPTOMS ONLY  . Vitamin D deficiency 07/02/2011  . Poor concentration 07/02/2011  . Multiple allergies 07/02/2011  . Anxiety 07/31/2011  . UTI (lower urinary tract infection) 08/28/2011  . Bronchitis 10/08/2011  . Otitis media 03/16/2012  . Shoulder pain, left 04/11/2012  . Alopecia   . Anxiety and depression 07/31/2011    Past Surgical History  Procedure Laterality Date  . Tonsillectomy  1982  . Rhinoplasty  2003  . Breast enhancement surgery  2007  . Cesarean section  2001 and 2004  . Laparoscopy      twice  . Tubal ligation      Family History  Problem Relation Age of Onset  . Heart disease Mother     MVP  . Anxiety disorder Mother   . Hypertension Brother   . Diabetes Brother     PRE DIABETES  . Obesity Brother   . Allergies Daughter   . Asthma Daughter   . Allergies Son   . Asthma Son   . Kidney disease Maternal Grandmother     uremia    History   Social History  . Marital Status: Married    Spouse Name: N/A    Number of Children: N/A  . Years of  Education: N/A   Occupational History  . Not on file.   Social History Main Topics  . Smoking status: Never Smoker   . Smokeless tobacco: Never Used  . Alcohol Use: No     Comment: RARELY  . Drug Use: No  . Sexually Active: Yes    Birth Control/ Protection: Surgical   Other Topics Concern  . Not on file   Social History Narrative  . No narrative on file    Current Outpatient Prescriptions on File Prior to Visit  Medication Sig Dispense Refill  . aspirin 81 MG tablet Take 81 mg by mouth daily.        Marland Kitchen AVAR CLEANSER 10-5 % EMUL       . Biotin 2500 MCG CAPS Take 1 capsule by mouth daily.        . Calcium Citrate-Vitamin D 250-200 MG-UNIT TABS Take 1 tablet by mouth daily.  30 each  11  . Cholecalciferol (VITAMIN D) 2000 UNITS CAPS Take 1 capsule (2,000 Units total) by mouth once.  30 capsule  11  . fexofenadine (ALLEGRA) 180 MG tablet Take 180 mg by mouth daily.        . finasteride (PROSCAR) 5 MG tablet Take 5 mg by mouth  daily. Take 1/2 tab daily      . fish oil-omega-3 fatty acids 1000 MG capsule Take 2 g by mouth daily.       . hydrOXYzine (ATARAX) 10 MG tablet Take 10 mg by mouth as needed.        . methylphenidate (RITALIN LA) 30 MG 24 hr capsule Take 1 capsule (30 mg total) by mouth every morning.  30 capsule  0  . methylphenidate (RITALIN) 10 MG tablet Take 1 tablet (10 mg total) by mouth daily at 2 PM daily at 2 PM.  30 tablet  0  . mometasone (NASONEX) 50 MCG/ACT nasal spray Place 2 sprays into the nose daily.  17 g  5  . Multiple Vitamin (MULTIVITAMIN) capsule Take 1 capsule by mouth daily.        . nitrofurantoin (MACRODANTIN) 50 MG capsule As needed      . Polysaccharide Iron Complex (POLY-IRON 150 PO) Take 150 mg by mouth 2 (two) times daily.      . ranitidine (ZANTAC) 150 MG tablet Take 1 tablet (150 mg total) by mouth 2 (two) times daily as needed for heartburn.  60 tablet  1  . senna (SENOKOT) 8.6 MG TABS Take 1 tablet by mouth daily.      Marland Kitchen spironolactone  (ALDACTONE) 25 MG tablet Take 25 mg by mouth daily. Take 2 tabs daily      . tretinoin (RETIN-A) 0.025 % cream       . ValACYclovir HCl (VALTREX PO) Take by mouth as needed.        . zinc gluconate 50 MG tablet Take 50 mg by mouth daily.      Marland Kitchen escitalopram (LEXAPRO) 20 MG tablet Take 1/2 tablet by mouth daily for 7 days then increase to 1 tablet daily thereafter.  30 tablet  0   No current facility-administered medications on file prior to visit.    No Known Allergies  Review of Systems  Review of Systems  Constitutional: Negative for fever and malaise/fatigue.  HENT: Negative for congestion.   Eyes: Negative for pain and discharge.  Respiratory: Negative for shortness of breath.   Cardiovascular: Negative for chest pain, palpitations and leg swelling.  Gastrointestinal: Negative for nausea, abdominal pain and diarrhea.  Genitourinary: Negative for dysuria.  Musculoskeletal: Negative for falls.  Skin: Negative for rash.  Neurological: Negative for loss of consciousness and headaches.  Endo/Heme/Allergies: Negative for polydipsia.  Psychiatric/Behavioral: Positive for depression. Negative for suicidal ideas. The patient is nervous/anxious. The patient does not have insomnia.     Objective  BP 90/60  Pulse 59  Temp(Src) 98 F (36.7 C) (Oral)  Resp 14  Ht 5' 5.5" (1.664 m)  Wt 115 lb 1.9 oz (52.218 kg)  BMI 18.86 kg/m2  SpO2 99%  Physical Exam  Physical Exam  Constitutional: She is oriented to person, place, and time and well-developed, well-nourished, and in no distress. No distress.  HENT:  Head: Normocephalic and atraumatic.  Eyes: Conjunctivae are normal.  Neck: Neck supple. No thyromegaly present.  Cardiovascular: Normal rate, regular rhythm and normal heart sounds.   No murmur heard. Pulmonary/Chest: Effort normal and breath sounds normal. She has no wheezes.  Abdominal: She exhibits no distension and no mass.  Musculoskeletal: She exhibits no edema.   Lymphadenopathy:    She has no cervical adenopathy.  Neurological: She is alert and oriented to person, place, and time.  Skin: Skin is warm and dry. No rash noted. She is not diaphoretic.  Psychiatric: Memory, affect and judgment normal.    Lab Results  Component Value Date   TSH 1.473 04/09/2013   Lab Results  Component Value Date   WBC 4.9 04/09/2013   HGB 12.6 04/09/2013   HCT 37.4 04/09/2013   MCV 85.2 04/09/2013   PLT 170 04/09/2013   Lab Results  Component Value Date   CREATININE 0.62 04/09/2013   BUN 14 04/09/2013   NA 141 04/09/2013   K 4.2 04/09/2013   CL 105 04/09/2013   CO2 30 04/09/2013   Lab Results  Component Value Date   ALT 9 04/09/2013   AST 12 04/09/2013   ALKPHOS 34* 04/09/2013   BILITOT 0.6 04/09/2013   Lab Results  Component Value Date   CHOL 180 06/28/2012   Lab Results  Component Value Date   HDL 54 06/28/2012   Lab Results  Component Value Date   LDLCALC 114* 06/28/2012   Lab Results  Component Value Date   TRIG 58 06/28/2012   Lab Results  Component Value Date   CHOLHDL 3.3 06/28/2012     Assessment & Plan  Anxiety and depression Was beginning to feel better but has now had a new development with a difficulty in her marriage. She feels she is managing but we will not decrease meds at this time. Will allow Alprazolam to continue for now. She is continuing frequent counseling as well  Adult ADHD Stable on current meds, no changes

## 2013-06-10 NOTE — Assessment & Plan Note (Signed)
Was beginning to feel better but has now had a new development with a difficulty in her marriage. She feels she is managing but we will not decrease meds at this time. Will allow Alprazolam to continue for now. She is continuing frequent counseling as well

## 2013-06-10 NOTE — Assessment & Plan Note (Signed)
Stable on current meds, no changes. 

## 2013-07-03 ENCOUNTER — Encounter: Payer: Self-pay | Admitting: Gynecology

## 2013-07-03 ENCOUNTER — Ambulatory Visit (INDEPENDENT_AMBULATORY_CARE_PROVIDER_SITE_OTHER): Payer: BC Managed Care – PPO | Admitting: Gynecology

## 2013-07-03 VITALS — BP 110/60 | Ht 65.5 in | Wt 115.0 lb

## 2013-07-03 DIAGNOSIS — Z01419 Encounter for gynecological examination (general) (routine) without abnormal findings: Secondary | ICD-10-CM

## 2013-07-03 DIAGNOSIS — N926 Irregular menstruation, unspecified: Secondary | ICD-10-CM

## 2013-07-03 DIAGNOSIS — Z13220 Encounter for screening for lipoid disorders: Secondary | ICD-10-CM

## 2013-07-03 DIAGNOSIS — Z113 Encounter for screening for infections with a predominantly sexual mode of transmission: Secondary | ICD-10-CM

## 2013-07-03 LAB — LIPID PANEL
Cholesterol: 194 mg/dL (ref 0–200)
HDL: 61 mg/dL (ref >39)
Triglycerides: 56 mg/dL (ref <150)

## 2013-07-03 MED ORDER — MEDROXYPROGESTERONE ACETATE 10 MG PO TABS: 10.0000 mg | ORAL_TABLET | Freq: Every day | ORAL | Status: DC

## 2013-07-03 NOTE — Progress Notes (Signed)
[name] 1971-08-01 [id number]        42 y.o.  [id number] for annual exam.  Several issues noted below.  Past medical history,surgical history, medications, allergies, family history and social history were all reviewed and documented in the EPIC chart.  ROS:  Performed and pertinent positives and negatives are included in the history, assessment and plan .  Exam: [name] assistant Filed Vitals:   07/03/13 1146  BP: 110/60  Height: 5' 5.5" (1.664 m)  Weight: 115 lb (52.164 kg)   General appearance  Normal Skin grossly normal Head/Neck normal with no cervical or supraclavicular adenopathy thyroid normal Lungs  clear Cardiac RR, without RMG Abdominal  soft, nontender, without masses, organomegaly or hernia Breasts  examined lying and sitting without masses, retractions, discharge or axillary adenopathy. Bilateral implants noted. Pelvic  Ext/BUS/vagina  normal  Cervix  normal GC/Chlamydia done  Uterus  anteverted, normal size, shape and contour, midline and mobile nontender   Adnexa  Without masses or tenderness    Anus and perineum  normal   Rectovaginal  normal sphincter tone without palpated masses or tenderness.    Assessment/Plan:  43 y.o. [id number] female for annual exam, tubal sterilization.   1. Menstrual irregularity. Patient's last menstrual period July 5. Reports menses were regular before this. Was evaluated a year ago for irregular menses with normal TSH FSH and sonohysterogram. Her menses seem to have regulated up until this most recent skip. Also noting some hot flushes and night sweats. We'll check baseline labs to include FSH TSH prolactin hCG estradiol. Plan progesterone withdrawal now with Provera 10 mg daily x10 days. Will report if no withdrawal bleeding or if has a period but then skips again following this. 2. STD screening. Patient requests STD screening. Has no known exposure once to be screened. GC chlamydia HIV RPR hepatitis B hepatitis C done. 3. Mammography 3-D  04/2013. Breast density category 4. She asked if any further testing could be done due to her dense breasts and I reviewed MRI possibilities. Patient declines but will continue to have annual 3-D mammography. SBE monthly reviewed. 4. Pap smear 2012. No Pap smear done today. No history of abnormal Pap smears previously. Plan repeat Pap smear next year at 3 year interval. 5. Health maintenance. Recently had comprehensive metabolic panel and CBC done. We'll check lipid profile urinalysis along with above blood work.   Note: This document was prepared with digital dictation and possible smart phrase technology. Any transcriptional errors that result from this process are unintentional.   Dara Lords MD, 12:19 PM 07/03/2013

## 2013-07-03 NOTE — Patient Instructions (Signed)
Office will contact you with blood results. Take the progesterone as discussed. Call if menstrual irregularity continues.

## 2013-07-04 LAB — URINALYSIS W MICROSCOPIC + REFLEX CULTURE
Bilirubin Urine: NEGATIVE
Leukocytes, UA: NEGATIVE
Nitrite: NEGATIVE
Protein, ur: NEGATIVE mg/dL
Specific Gravity, Urine: 1.023 (ref 1.005–1.030)
Squamous Epithelial / LPF: NONE SEEN
Urobilinogen, UA: 0.2 mg/dL (ref 0.0–1.0)

## 2013-07-04 LAB — HIV ANTIBODY (ROUTINE TESTING W REFLEX): HIV: NONREACTIVE

## 2013-07-04 LAB — FOLLICLE STIMULATING HORMONE: FSH: 72.4 m[IU]/mL

## 2013-07-04 LAB — PROLACTIN: Prolactin: 4.6 ng/mL

## 2013-07-04 LAB — ESTRADIOL: Estradiol: 62.2 pg/mL

## 2013-07-05 ENCOUNTER — Ambulatory Visit (INDEPENDENT_AMBULATORY_CARE_PROVIDER_SITE_OTHER): Payer: BC Managed Care – PPO | Admitting: Gynecology

## 2013-07-05 ENCOUNTER — Encounter: Payer: Self-pay | Admitting: Gynecology

## 2013-07-05 DIAGNOSIS — N951 Menopausal and female climacteric states: Secondary | ICD-10-CM

## 2013-07-05 NOTE — Progress Notes (Signed)
Patient presents to discuss her lab work. TSH prolactin and hCG all negative. Mild elevated LDL. Elevated FSH at 72. Certainly explains her symptoms of hot flushes sweats and skipped menses.  I reviewed the whole issue of HRT with her to include the WHI study with increased risk of stroke, heart attack, DVT and breast cancer. The ACOG and NAMS statements for lowest dose for the shortest period of time reviewed. Transdermal versus oral first-pass effect benefit discussed.  Early menopause and possible increased benefits of HRT from a symptom relief standpoint, cardiovascular preventative and bone health standpoint also discussed. Various regimens to include standard low-dose estrogen/progesterone replacement up to and including low-dose oral contraceptives discussed. Given her younger age and no long history of amenorrhea feel low-dose oral contraceptive replacement a better choice for menstrual regulation along with symptom relief. Can readdress after a year or 2 about whether to transition to a lower dose. Risks   of BCPs discussed to include stroke heart attack DVT. she's not been followed for any vascular disease and never smoked. Patient's comfortable starting. I gave her 2 sample packs of LoLoEstrin she'll call me into the second pack to see how she's doing. Taking them continuously versus an every other to every third month withdrawal option was also discussed with her.

## 2013-07-05 NOTE — Patient Instructions (Signed)
Start on the birth control pills as we discussed. Call me in one to 2 months to let me know how you're doing. Call me sooner if any issues.

## 2013-07-20 ENCOUNTER — Encounter: Payer: Self-pay | Admitting: Family Medicine

## 2013-07-20 ENCOUNTER — Ambulatory Visit (INDEPENDENT_AMBULATORY_CARE_PROVIDER_SITE_OTHER): Payer: BC Managed Care – PPO | Admitting: Family Medicine

## 2013-07-20 VITALS — BP 90/62 | HR 72 | Temp 99.0°F | Ht 65.5 in | Wt 114.1 lb

## 2013-07-20 DIAGNOSIS — F411 Generalized anxiety disorder: Secondary | ICD-10-CM

## 2013-07-20 DIAGNOSIS — N951 Menopausal and female climacteric states: Secondary | ICD-10-CM

## 2013-07-20 DIAGNOSIS — F909 Attention-deficit hyperactivity disorder, unspecified type: Secondary | ICD-10-CM

## 2013-07-20 DIAGNOSIS — F341 Dysthymic disorder: Secondary | ICD-10-CM

## 2013-07-20 DIAGNOSIS — K589 Irritable bowel syndrome without diarrhea: Secondary | ICD-10-CM

## 2013-07-20 DIAGNOSIS — R41840 Attention and concentration deficit: Secondary | ICD-10-CM

## 2013-07-20 DIAGNOSIS — F329 Major depressive disorder, single episode, unspecified: Secondary | ICD-10-CM

## 2013-07-20 DIAGNOSIS — N390 Urinary tract infection, site not specified: Secondary | ICD-10-CM

## 2013-07-20 DIAGNOSIS — Z23 Encounter for immunization: Secondary | ICD-10-CM

## 2013-07-20 MED ORDER — CIPROFLOXACIN HCL 500 MG PO TABS: ORAL_TABLET | ORAL | Status: DC

## 2013-07-20 MED ORDER — HYOSCYAMINE SULFATE 0.125 MG SL SUBL: 0.1250 mg | SUBLINGUAL_TABLET | SUBLINGUAL | Status: DC | PRN

## 2013-07-20 MED ORDER — METHYLPHENIDATE HCL ER (LA) 30 MG PO CP24: 30.0000 mg | ORAL_CAPSULE | ORAL | Status: DC

## 2013-07-20 MED ORDER — ESCITALOPRAM OXALATE 10 MG PO TABS: 10.0000 mg | ORAL_TABLET | Freq: Every day | ORAL | Status: DC

## 2013-07-20 MED ORDER — METHOCARBAMOL 500 MG PO TABS: 500.0000 mg | ORAL_TABLET | Freq: Two times a day (BID) | ORAL | Status: DC | PRN

## 2013-07-20 MED ORDER — FLUCONAZOLE 150 MG PO TABS: 150.0000 mg | ORAL_TABLET | Freq: Once | ORAL | Status: DC

## 2013-07-20 MED ORDER — METHYLPHENIDATE HCL 10 MG PO TABS: 10.0000 mg | ORAL_TABLET | Freq: Every day | ORAL | Status: DC

## 2013-07-22 ENCOUNTER — Encounter: Payer: Self-pay | Admitting: Family Medicine

## 2013-07-22 DIAGNOSIS — N951 Menopausal and female climacteric states: Secondary | ICD-10-CM | POA: Insufficient documentation

## 2013-07-22 HISTORY — DX: Menopausal and female climacteric states: N95.1

## 2013-07-22 NOTE — Assessment & Plan Note (Signed)
Still following closely with her counselor and under a great deal of stress with her marriage, will not adjust meds today may continue her Alprazolam at bid dosing and she is once again encouraged to start lexapro 5-10 mg daily at her discretion.

## 2013-07-22 NOTE — Assessment & Plan Note (Signed)
Stable on Adderakk akkiwed refukk tidat

## 2013-07-22 NOTE — Progress Notes (Signed)
Patient ID: [name], female   DOB: Nov 29, 1970, 42 y.o.   MRN: [id number] [name] [id number] [birth year] 07/22/2013      Progress Note-Follow Up  Subjective  Chief Complaint  Chief Complaint  Patient presents with  . Follow-up    6 week  . Injections    flu    HPI   42 year old Caucasian female is in today once again for followup. She continues to struggle with life stressors and trouble to her marriage. Does feel that the Xanax twice a day is helping. Has not started her SSRI yet continues to follow closely with her counselor. Has been following with GYN as well for perimenopausal hot flashes and after the start of low Loestrin recently she has seen improvement. No acute illness. He has some urinary symptoms and took a course of Cipro recently but did not find it helpful. Unfortunately did develop some vaginitis. Will follow up with urology. No chest pain or palpitations, no shortness of breath or fevers.  Past Medical History  Diagnosis Date  . GERD (gastroesophageal reflux disease)   . IBS (irritable bowel syndrome)   . Interstitial cystitis   . Premature atrial contractions 2012  . Adult ADHD     SYMPTOMS ONLY  . Vitamin D deficiency 07/02/2011  . Poor concentration 07/02/2011  . Multiple allergies 07/02/2011  . Anxiety 07/31/2011  . UTI (lower urinary tract infection) 08/28/2011  . Otitis media 03/16/2012  . Shoulder pain, left 04/11/2012  . Alopecia   . Anxiety and depression 07/31/2011  . Depression   . Perimenopausal 07/22/2013    Past Surgical History  Procedure Laterality Date  . Tonsillectomy  1982  . Rhinoplasty  2003  . Breast enhancement surgery  2007  . Cesarean section  2001 and 2004  . Laparoscopy      twice  . Tubal ligation      Family History  Problem Relation Age of Onset  . Heart disease Mother     MVP  . Anxiety disorder Mother   . Hypertension Brother   . Diabetes Brother     PRE DIABETES  . Obesity Brother   . Allergies Daughter   .  Asthma Daughter   . Allergies Son   . Asthma Son   . Kidney disease Maternal Grandmother     uremia    History   Social History  . Marital Status: Married    Spouse Name: N/A    Number of Children: N/A  . Years of Education: N/A   Occupational History  . Not on file.   Social History Main Topics  . Smoking status: Never Smoker   . Smokeless tobacco: Never Used  . Alcohol Use: Yes     Comment: RARELY  . Drug Use: No  . Sexual Activity: Yes    Birth Control/ Protection: Surgical     Comment: Tubal lig   Other Topics Concern  . Not on file   Social History Narrative  . No narrative on file    Current Outpatient Prescriptions on File Prior to Visit  Medication Sig Dispense Refill  . ALPRAZolam (XANAX XR) 0.5 MG 24 hr tablet 2 tab po in am and 1 to 2 tabs po in pm as needed  120 tablet  1  . aspirin 81 MG tablet Take 81 mg by mouth daily.        Marland Kitchen AVAR CLEANSER 10-5 % EMUL       . Biotin 2500 MCG CAPS Take 1 capsule  by mouth daily.        . Calcium Citrate-Vitamin D 250-200 MG-UNIT TABS Take 1 tablet by mouth daily.  30 each  11  . Cholecalciferol (VITAMIN D) 2000 UNITS CAPS Take 1 capsule (2,000 Units total) by mouth once.  30 capsule  11  . fexofenadine (ALLEGRA) 180 MG tablet Take 180 mg by mouth daily.        . finasteride (PROSCAR) 5 MG tablet Take 5 mg by mouth daily. Take 1/2 tab daily      . fish oil-omega-3 fatty acids 1000 MG capsule Take 2 g by mouth daily.       . hydrOXYzine (ATARAX) 10 MG tablet Take 10 mg by mouth as needed.        . mometasone (NASONEX) 50 MCG/ACT nasal spray Place 2 sprays into the nose daily.  17 g  5  . Multiple Vitamin (MULTIVITAMIN) capsule Take 1 capsule by mouth daily.        . nitrofurantoin (MACRODANTIN) 50 MG capsule As needed      . Polysaccharide Iron Complex (POLY-IRON 150 PO) Take 150 mg by mouth 2 (two) times daily.      Marland Kitchen senna (SENOKOT) 8.6 MG TABS Take 1 tablet by mouth daily.      Marland Kitchen spironolactone (ALDACTONE) 25 MG  tablet Take 25 mg by mouth daily. Take 2 tabs daily      . tretinoin (RETIN-A) 0.025 % cream       . ValACYclovir HCl (VALTREX PO) Take by mouth as needed.        . zinc gluconate 50 MG tablet Take 50 mg by mouth daily.       No current facility-administered medications on file prior to visit.    No Known Allergies  Review of Systems  Review of Systems  Constitutional: Negative for fever and malaise/fatigue.  HENT: Negative for congestion.   Eyes: Negative for discharge.  Respiratory: Negative for shortness of breath.   Cardiovascular: Negative for chest pain, palpitations and leg swelling.  Gastrointestinal: Negative for nausea, abdominal pain and diarrhea.  Genitourinary: Negative for dysuria.  Musculoskeletal: Negative for falls.  Skin: Negative for rash.  Neurological: Negative for loss of consciousness and headaches.  Endo/Heme/Allergies: Negative for polydipsia.  Psychiatric/Behavioral: Positive for depression. Negative for suicidal ideas. The patient is nervous/anxious. The patient does not have insomnia.     Objective  BP 90/62  Pulse 72  Temp(Src) 99 F (37.2 C) (Oral)  Ht 5' 5.5" (1.664 m)  Wt 114 lb 1.3 oz (51.746 kg)  BMI 18.69 kg/m2  SpO2 97%  LMP 05/05/2013  Physical Exam  Physical Exam  Constitutional: She is oriented to person, place, and time and well-developed, well-nourished, and in no distress. No distress.  HENT:  Head: Normocephalic and atraumatic.  Eyes: Conjunctivae are normal.  Neck: Neck supple. No thyromegaly present.  Cardiovascular: Normal rate, regular rhythm and normal heart sounds.   No murmur heard. Pulmonary/Chest: Effort normal and breath sounds normal. She has no wheezes.  Abdominal: She exhibits no distension and no mass.  Musculoskeletal: She exhibits no edema.  Lymphadenopathy:    She has no cervical adenopathy.  Neurological: She is alert and oriented to person, place, and time.  Skin: Skin is warm and dry. No rash noted.  She is not diaphoretic.  Psychiatric: Memory, affect and judgment normal.    Lab Results  Component Value Date   TSH 0.924 07/03/2013   Lab Results  Component Value Date  WBC 4.9 04/09/2013   HGB 12.6 04/09/2013   HCT 37.4 04/09/2013   MCV 85.2 04/09/2013   PLT 170 04/09/2013   Lab Results  Component Value Date   CREATININE 0.62 04/09/2013   BUN 14 04/09/2013   NA 141 04/09/2013   K 4.2 04/09/2013   CL 105 04/09/2013   CO2 30 04/09/2013   Lab Results  Component Value Date   ALT 9 04/09/2013   AST 12 04/09/2013   ALKPHOS 34* 04/09/2013   BILITOT 0.6 04/09/2013   Lab Results  Component Value Date   CHOL 194 07/03/2013   Lab Results  Component Value Date   HDL 61 07/03/2013   Lab Results  Component Value Date   LDLCALC 122* 07/03/2013   Lab Results  Component Value Date   TRIG 56 07/03/2013   Lab Results  Component Value Date   CHOLHDL 3.2 07/03/2013     Assessment & Plan  Anxiety and depression Still following closely with her counselor and under a great deal of stress with her marriage, will not adjust meds today may continue her Alprazolam at bid dosing and she is once again encouraged to start lexapro 5-10 mg daily at her discretion.  Adult ADHD Stable on Adderakk akkiwed refukk tidat  Perimenopausal Recent testing with her GYN revealed an FSH of 72, they have start LoLoestrin Fe with a decrease in hot flashes.

## 2013-07-22 NOTE — Assessment & Plan Note (Signed)
Recent testing with her GYN revealed an FSH of 72, they have start LoLoestrin Fe with a decrease in hot flashes.

## 2013-08-02 ENCOUNTER — Ambulatory Visit (INDEPENDENT_AMBULATORY_CARE_PROVIDER_SITE_OTHER): Payer: BC Managed Care – PPO | Admitting: Women's Health

## 2013-08-02 ENCOUNTER — Encounter: Payer: Self-pay | Admitting: Women's Health

## 2013-08-02 ENCOUNTER — Telehealth: Payer: Self-pay | Admitting: Family Medicine

## 2013-08-02 DIAGNOSIS — R300 Dysuria: Secondary | ICD-10-CM

## 2013-08-02 LAB — URINALYSIS W MICROSCOPIC + REFLEX CULTURE
Casts: NONE SEEN
Crystals: NONE SEEN
Specific Gravity, Urine: >1.03 — ABNORMAL HIGH (ref 1.005–1.030)
Urobilinogen, UA: 0.2 mg/dL (ref 0.0–1.0)
pH: 6.5 (ref 5.0–8.0)

## 2013-08-02 NOTE — Progress Notes (Signed)
Patient ID: [name], female   DOB: February 17, 1971, 42 y.o.   MRN: [id number] Presents with complaint of bladder pressure, pain, increased frequency and no relief from tramadol or uribel. IC - Dr. Logan Bores manages. Reports constant bladder pain for past several days. Situational stress at home. Denies vaginal discharge, fever.  Exam: Appears uncomfortable  UA: Trace blood, 0 - 2 RBCs, rare bacteria, no wbc's.  Bladder pain/IC  Plan: Urine culture pending. Followup with Dr. Logan Bores.

## 2013-08-02 NOTE — Telephone Encounter (Signed)
Please advise 

## 2013-08-02 NOTE — Telephone Encounter (Signed)
Ok to order UA C&S. Once she has done that can have refill on her antibiotic, I think she has used Ciprofloxacin 500 mg po bid x 7 days

## 2013-08-02 NOTE — Patient Instructions (Addendum)

## 2013-08-02 NOTE — Telephone Encounter (Signed)
Pt went to her gynecologist

## 2013-08-02 NOTE — Telephone Encounter (Signed)
Patient states that she thinks she has another UTI and would like to come in to give a urine sample. I offered appointment but patient declined.

## 2013-08-04 LAB — URINE CULTURE: Organism ID, Bacteria: NO GROWTH

## 2013-08-06 ENCOUNTER — Encounter: Payer: Self-pay | Admitting: Family Medicine

## 2013-08-06 ENCOUNTER — Ambulatory Visit (INDEPENDENT_AMBULATORY_CARE_PROVIDER_SITE_OTHER): Payer: BC Managed Care – PPO | Admitting: Family Medicine

## 2013-08-06 VITALS — BP 90/72 | HR 64 | Temp 98.4°F | Ht 65.5 in | Wt 113.0 lb

## 2013-08-06 DIAGNOSIS — F909 Attention-deficit hyperactivity disorder, unspecified type: Secondary | ICD-10-CM

## 2013-08-06 DIAGNOSIS — F329 Major depressive disorder, single episode, unspecified: Secondary | ICD-10-CM

## 2013-08-06 DIAGNOSIS — R002 Palpitations: Secondary | ICD-10-CM

## 2013-08-06 DIAGNOSIS — F341 Dysthymic disorder: Secondary | ICD-10-CM

## 2013-08-06 MED ORDER — ALPRAZOLAM ER 0.5 MG PO TB24: 0.5000 mg | ORAL_TABLET | Freq: Three times a day (TID) | ORAL | Status: DC | PRN

## 2013-08-06 NOTE — Progress Notes (Signed)
Patient ID: [name], female   DOB: 08-19-1971, 42 y.o.   MRN: [id number] [name] [id number] 06-10-1971 08/06/2013      Progress Note-Follow Up  Subjective  Chief Complaint  Chief Complaint  Patient presents with  . Fatigue    X "been going on for awhile"  . Fall    pt states she has fell 3-4 times in a month- doesn't trip, bump into anything etc... has noticed that she has been dizzy sometimes before falling    HPI  Patient is a 42 year old Caucasian female who is in today with numerous concerns. She continues to struggle with worsening depression anxiety but unfortunately her height is reason she has had increased fatigue and even simple. She describes feeling slightly lightheaded frequently. She continues to marital difficulties but is managing them well with counseling. Has agreed to start an SSRI minimally. No chest pain, palpitations, shortness of breath  Past Medical History  Diagnosis Date  . GERD (gastroesophageal reflux disease)   . IBS (irritable bowel syndrome)   . Interstitial cystitis   . Premature atrial contractions 2012  . Adult ADHD     SYMPTOMS ONLY  . Vitamin D deficiency 07/02/2011  . Poor concentration 07/02/2011  . Multiple allergies 07/02/2011  . Anxiety 07/31/2011  . UTI (lower urinary tract infection) 08/28/2011  . Otitis media 03/16/2012  . Shoulder pain, left 04/11/2012  . Alopecia   . Anxiety and depression 07/31/2011  . Depression   . Perimenopausal 07/22/2013    Past Surgical History  Procedure Laterality Date  . Tonsillectomy  1982  . Rhinoplasty  2003  . Breast enhancement surgery  2007  . Cesarean section  2001 and 2004  . Laparoscopy      twice  . Tubal ligation      Family History  Problem Relation Age of Onset  . Heart disease Mother     MVP  . Anxiety disorder Mother   . Hypertension Brother   . Diabetes Brother     PRE DIABETES  . Obesity Brother   . Allergies Daughter   . Asthma Daughter   . Allergies Son   . Asthma  Son   . Kidney disease Maternal Grandmother     uremia    History   Social History  . Marital Status: Married    Spouse Name: N/A    Number of Children: N/A  . Years of Education: N/A   Occupational History  . Not on file.   Social History Main Topics  . Smoking status: Never Smoker   . Smokeless tobacco: Never Used  . Alcohol Use: Yes     Comment: RARELY  . Drug Use: No  . Sexual Activity: Yes    Birth Control/ Protection: Surgical     Comment: Tubal lig   Other Topics Concern  . Not on file   Social History Narrative  . No narrative on file    Current Outpatient Prescriptions on File Prior to Visit  Medication Sig Dispense Refill  . aspirin 81 MG tablet Take 81 mg by mouth daily.        Marland Kitchen AVAR CLEANSER 10-5 % EMUL       . Biotin 2500 MCG CAPS Take 1 capsule by mouth daily.        . Calcium Citrate-Vitamin D 250-200 MG-UNIT TABS Take 1 tablet by mouth daily.  30 each  11  . Cholecalciferol (VITAMIN D) 2000 UNITS CAPS Take 1 capsule (2,000 Units total) by mouth once.  30 capsule  11  . ciprofloxacin (CIPRO) 500 MG tablet 1 cap po bid x 4-7 days as directed  14 tablet  1  . escitalopram (LEXAPRO) 10 MG tablet Take 1 tablet (10 mg total) by mouth daily.  30 tablet  1  . fexofenadine (ALLEGRA) 180 MG tablet Take 180 mg by mouth daily.        . finasteride (PROSCAR) 5 MG tablet Take 5 mg by mouth daily. Take 1/2 tab daily      . fish oil-omega-3 fatty acids 1000 MG capsule Take 2 g by mouth daily.       . hydrOXYzine (ATARAX) 10 MG tablet Take 10 mg by mouth as needed.        . hyoscyamine (LEVSIN SL) 0.125 MG SL tablet Place 1 tablet (0.125 mg total) under the tongue every 4 (four) hours as needed for cramping (smooth muscle spasm).  30 tablet  1  . methocarbamol (ROBAXIN) 500 MG tablet Take 1 tablet (500 mg total) by mouth 2 (two) times daily as needed. Muscle spasm  30 tablet  1  . methylphenidate (RITALIN LA) 30 MG 24 hr capsule Take 1 capsule (30 mg total) by mouth  every morning.  30 capsule  0  . methylphenidate (RITALIN) 10 MG tablet Take 1 tablet (10 mg total) by mouth daily at 2 PM daily at 2 PM.  30 tablet  0  . mometasone (NASONEX) 50 MCG/ACT nasal spray Place 2 sprays into the nose daily.  17 g  5  . Multiple Vitamin (MULTIVITAMIN) capsule Take 1 capsule by mouth daily.        . Norethindrone-Ethinyl Estradiol-Fe Biphas (LO LOESTRIN FE) 1 MG-10 MCG / 10 MCG tablet Take 1 tablet by mouth daily.  1 Package  11  . Polysaccharide Iron Complex (POLY-IRON 150 PO) Take 150 mg by mouth 2 (two) times daily.      Marland Kitchen senna (SENOKOT) 8.6 MG TABS Take 1 tablet by mouth daily.      Marland Kitchen spironolactone (ALDACTONE) 25 MG tablet Take 25 mg by mouth daily. Take 2 tabs daily      . tretinoin (RETIN-A) 0.025 % cream       . ValACYclovir HCl (VALTREX PO) Take by mouth as needed.        . zinc gluconate 50 MG tablet Take 50 mg by mouth daily.      . nitrofurantoin (MACRODANTIN) 50 MG capsule As needed       No current facility-administered medications on file prior to visit.    No Known Allergies  Review of Systems  Review of Systems  Constitutional: Positive for weight loss and malaise/fatigue. Negative for fever.  HENT: Negative for congestion.   Eyes: Negative for discharge.  Respiratory: Negative for shortness of breath.   Cardiovascular: Negative for chest pain, palpitations and leg swelling.  Gastrointestinal: Negative for nausea, abdominal pain and diarrhea.  Genitourinary: Negative for dysuria.  Musculoskeletal: Positive for falls.  Skin: Negative for rash.  Neurological: Negative for loss of consciousness and headaches.  Endo/Heme/Allergies: Negative for polydipsia.  Psychiatric/Behavioral: Positive for depression. Negative for suicidal ideas. The patient is nervous/anxious. The patient does not have insomnia.     Objective  BP 90/72  Pulse 64  Temp(Src) 98.4 F (36.9 C) (Oral)  Ht 5' 5.5" (1.664 m)  Wt 113 lb 0.6 oz (51.275 kg)  BMI 18.52  kg/m2  SpO2 97%  LMP 07/24/2013  Physical Exam  Physical Exam  Constitutional: She is oriented  to person, place, and time and well-developed, well-nourished, and in no distress. No distress.  HENT:  Head: Normocephalic and atraumatic.  Eyes: Conjunctivae are normal.  Neck: Neck supple. No thyromegaly present.  Cardiovascular: Normal rate, regular rhythm and normal heart sounds.   No murmur heard. Pulmonary/Chest: Effort normal and breath sounds normal. She has no wheezes.  Abdominal: She exhibits no distension and no mass.  Musculoskeletal: She exhibits no edema.  Lymphadenopathy:    She has no cervical adenopathy.  Neurological: She is alert and oriented to person, place, and time.  Skin: Skin is warm and dry. No rash noted. She is not diaphoretic.  Psychiatric: Memory, affect and judgment normal.    Lab Results  Component Value Date   TSH 0.924 07/03/2013   Lab Results  Component Value Date   WBC 4.9 04/09/2013   HGB 12.6 04/09/2013   HCT 37.4 04/09/2013   MCV 85.2 04/09/2013   PLT 170 04/09/2013   Lab Results  Component Value Date   CREATININE 0.62 04/09/2013   BUN 14 04/09/2013   NA 141 04/09/2013   K 4.2 04/09/2013   CL 105 04/09/2013   CO2 30 04/09/2013   Lab Results  Component Value Date   ALT 9 04/09/2013   AST 12 04/09/2013   ALKPHOS 34* 04/09/2013   BILITOT 0.6 04/09/2013   Lab Results  Component Value Date   CHOL 194 07/03/2013   Lab Results  Component Value Date   HDL 61 07/03/2013   Lab Results  Component Value Date   LDLCALC 122* 07/03/2013   Lab Results  Component Value Date   TRIG 56 07/03/2013   Lab Results  Component Value Date   CHOLHDL 3.2 07/03/2013     Assessment & Plan  Adult ADHD Tolerating Ritalin  Palpitations No recent c/o  Anxiety and depression Has been worsening as her marriage has struggled, she is finally ready to start the Lexapro. Start at 4 mg daily and then increase to 10 mg daily. Will need to cut down on Alprazolam dosing due to her  increasing fatigue and falls, she is in agreement and will begin decreasing doses slowly. Return in next couple of weeks for review.

## 2013-08-06 NOTE — Patient Instructions (Addendum)
Drop the Alprazolam to 1 tab po 3  X daily for a week then can drop to 1 tab twice a day as needed 1 small Gatorade a day and 64 oz of clear fluids, protein each time you eat Ginger can help the nausea, can take a cap roughly 1/2 hour prior to meds Loose stool, probiotics and fiber can help, Digestive Advantage, MEtamucil or Benefiber  Dehydration, Adult Dehydration is when you lose more fluids from the body than you take in. Vital organs like the kidneys, brain, and heart cannot function without a proper amount of fluids and salt. Any loss of fluids from the body can cause dehydration.  CAUSES   Vomiting.  Diarrhea.  Excessive sweating.  Excessive urine output.  Fever. SYMPTOMS  Mild dehydration  Thirst.  Dry lips.  Slightly dry mouth. Moderate dehydration  Very dry mouth.  Sunken eyes.  Skin does not bounce back quickly when lightly pinched and released.  Dark urine and decreased urine production.  Decreased tear production.  Headache. Severe dehydration  Very dry mouth.  Extreme thirst.  Rapid, weak pulse (more than 100 beats per minute at rest).  Cold hands and feet.  Not able to sweat in spite of heat and temperature.  Rapid breathing.  Blue lips.  Confusion and lethargy.  Difficulty being awakened.  Minimal urine production.  No tears. DIAGNOSIS  Your caregiver will diagnose dehydration based on your symptoms and your exam. Blood and urine tests will help confirm the diagnosis. The diagnostic evaluation should also identify the cause of dehydration. TREATMENT  Treatment of mild or moderate dehydration can often be done at home by increasing the amount of fluids that you drink. It is best to drink small amounts of fluid more often. Drinking too much at one time can make vomiting worse. Refer to the home care instructions below. Severe dehydration needs to be treated at the hospital where you will probably be given intravenous (IV) fluids that  contain water and electrolytes. HOME CARE INSTRUCTIONS   Ask your caregiver about specific rehydration instructions.  Drink enough fluids to keep your urine clear or pale yellow.  Drink small amounts frequently if you have nausea and vomiting.  Eat as you normally do.  Avoid:  Foods or drinks high in sugar.  Carbonated drinks.  Juice.  Extremely hot or cold fluids.  Drinks with caffeine.  Fatty, greasy foods.  Alcohol.  Tobacco.  Overeating.  Gelatin desserts.  Wash your hands well to avoid spreading bacteria and viruses.  Only take over-the-counter or prescription medicines for pain, discomfort, or fever as directed by your caregiver.  Ask your caregiver if you should continue all prescribed and over-the-counter medicines.  Keep all follow-up appointments with your caregiver. SEEK MEDICAL CARE IF:  You have abdominal pain and it increases or stays in one area (localizes).  You have a rash, stiff neck, or severe headache.  You are irritable, sleepy, or difficult to awaken.  You are weak, dizzy, or extremely thirsty. SEEK IMMEDIATE MEDICAL CARE IF:   You are unable to keep fluids down or you get worse despite treatment.  You have frequent episodes of vomiting or diarrhea.  You have blood or green matter (bile) in your vomit.  You have blood in your stool or your stool looks black and tarry.  You have not urinated in 6 to 8 hours, or you have only urinated a small amount of very dark urine.  You have a fever.  You faint. MAKE SURE  YOU:   Understand these instructions.  Will watch your condition.  Will get help right away if you are not doing well or get worse. Document Released: 10/18/2005 Document Revised: 01/10/2012 Document Reviewed: 06/07/2011 [facility] Patient Information 2014 [place], [place].

## 2013-08-11 NOTE — Assessment & Plan Note (Signed)
No recent c/o 

## 2013-08-11 NOTE — Assessment & Plan Note (Signed)
Has been worsening as her marriage has struggled, she is finally ready to start the Lexapro. Start at 4 mg daily and then increase to 10 mg daily. Will need to cut down on Alprazolam dosing due to her increasing fatigue and falls, she is in agreement and will begin decreasing doses slowly. Return in next couple of weeks for review.

## 2013-08-11 NOTE — Assessment & Plan Note (Signed)
Tolerating Ritalin

## 2013-08-30 ENCOUNTER — Ambulatory Visit: Payer: BC Managed Care – PPO | Admitting: Family Medicine

## 2013-08-31 ENCOUNTER — Encounter: Payer: Self-pay | Admitting: Gynecology

## 2013-08-31 ENCOUNTER — Ambulatory Visit (INDEPENDENT_AMBULATORY_CARE_PROVIDER_SITE_OTHER): Payer: BC Managed Care – PPO | Admitting: Gynecology

## 2013-08-31 DIAGNOSIS — N951 Menopausal and female climacteric states: Secondary | ICD-10-CM

## 2013-08-31 DIAGNOSIS — N926 Irregular menstruation, unspecified: Secondary | ICD-10-CM

## 2013-08-31 MED ORDER — NORETHINDRONE ACET-ETHINYL EST 1-20 MG-MCG PO TABS: 1.0000 | ORAL_TABLET | Freq: Every day | ORAL | Status: DC

## 2013-08-31 NOTE — Progress Notes (Signed)
Patient presents having recently been started on LoLoEstrin for menopausal symptoms. Notes that her menopausal symptoms have significantly improved as far as hot flashes and sleep disturbances. Does note though over the last 6 weeks bleeding on and off since starting the pills. She's but up to 2 weeks straight. Not having significant dysmenorrhea or pain. Had previously been evaluated with a negative sonohysterogram and normal hormone levels noting an FSH of 72.  Exam was Berenice Bouton Abdomen soft nontender without masses guarding or rebound. Pelvic external BUS vagina normal without significant bleeding. Cervix normal. Uterus normal size midline mobile nontender. Adnexa without masses or tenderness.  Assessment and plan: Irregular bleeding in the second pack of low-dose oral contraceptives. Will increase to LoEstrin 120 equivalent. Followup if irregular bleeding continues. If regulates after several packs a she's going to try an every other to every third month withdrawal. Offbrand labeling issues reviewed.

## 2013-08-31 NOTE — Patient Instructions (Signed)
Start on the new birth control pill. Call me if irregular bleeding continues.

## 2013-09-07 ENCOUNTER — Ambulatory Visit (INDEPENDENT_AMBULATORY_CARE_PROVIDER_SITE_OTHER): Payer: BC Managed Care – PPO | Admitting: Family Medicine

## 2013-09-07 ENCOUNTER — Encounter: Payer: Self-pay | Admitting: Family Medicine

## 2013-09-07 VITALS — BP 90/70 | HR 71 | Temp 98.3°F | Ht 65.5 in | Wt 114.1 lb

## 2013-09-07 DIAGNOSIS — F411 Generalized anxiety disorder: Secondary | ICD-10-CM

## 2013-09-07 DIAGNOSIS — Z23 Encounter for immunization: Secondary | ICD-10-CM

## 2013-09-07 DIAGNOSIS — F341 Dysthymic disorder: Secondary | ICD-10-CM

## 2013-09-07 DIAGNOSIS — F329 Major depressive disorder, single episode, unspecified: Secondary | ICD-10-CM

## 2013-09-07 DIAGNOSIS — N951 Menopausal and female climacteric states: Secondary | ICD-10-CM

## 2013-09-07 MED ORDER — ALPRAZOLAM ER 0.5 MG PO TB24: 0.5000 mg | ORAL_TABLET | Freq: Two times a day (BID) | ORAL | Status: DC | PRN

## 2013-09-07 MED ORDER — ESCITALOPRAM OXALATE 10 MG PO TABS: 15.0000 mg | ORAL_TABLET | Freq: Every day | ORAL | Status: DC

## 2013-09-07 NOTE — Patient Instructions (Signed)

## 2013-09-10 NOTE — Progress Notes (Signed)
[name] [id number] 03/20/1971 09/10/2013      Progress Note-Follow Up  Subjective  Chief Complaint  Chief Complaint  Patient presents with  . Follow-up  . Injections    tdap    HPI  Patient is a 42 yo caucasian female in today for routine follow up she continues to struggle with significant stresses at home and in her marriage but does believe the Lexapro is beginning to help. She is feeling much calmer. She's ready to decrease her alprazolam to twice daily. Continues with weekly counseling sessions. No palpitations or chest pain. No shortness or breath GI or GU concerns noted today.  Past Medical History  Diagnosis Date  . GERD (gastroesophageal reflux disease)   . IBS (irritable bowel syndrome)   . Interstitial cystitis   . Premature atrial contractions 2012  . Adult ADHD     SYMPTOMS ONLY  . Vitamin D deficiency 07/02/2011  . Poor concentration 07/02/2011  . Multiple allergies 07/02/2011  . Anxiety 07/31/2011  . UTI (lower urinary tract infection) 08/28/2011  . Otitis media 03/16/2012  . Shoulder pain, left 04/11/2012  . Alopecia   . Anxiety and depression 07/31/2011  . Depression   . Perimenopausal 07/22/2013    Past Surgical History  Procedure Laterality Date  . Tonsillectomy  1982  . Rhinoplasty  2003  . Breast enhancement surgery  2007  . Cesarean section  2001 and 2004  . Laparoscopy      twice  . Tubal ligation      Family History  Problem Relation Age of Onset  . Heart disease Mother     MVP  . Anxiety disorder Mother   . Hypertension Brother   . Diabetes Brother     PRE DIABETES  . Obesity Brother   . Allergies Daughter   . Asthma Daughter   . Allergies Son   . Asthma Son   . Kidney disease Maternal Grandmother     uremia    History   Social History  . Marital Status: Married    Spouse Name: N/A    Number of Children: N/A  . Years of Education: N/A   Occupational History  . Not on file.   Social History Main Topics  . Smoking  status: Never Smoker   . Smokeless tobacco: Never Used  . Alcohol Use: Yes     Comment: RARELY  . Drug Use: No  . Sexual Activity: Yes    Birth Control/ Protection: Surgical     Comment: Tubal lig   Other Topics Concern  . Not on file   Social History Narrative  . No narrative on file    Current Outpatient Prescriptions on File Prior to Visit  Medication Sig Dispense Refill  . aspirin 81 MG tablet Take 81 mg by mouth daily.        Marland Kitchen AVAR CLEANSER 10-5 % EMUL       . Biotin 2500 MCG CAPS Take 1 capsule by mouth daily.        . Calcium Citrate-Vitamin D 250-200 MG-UNIT TABS Take 1 tablet by mouth daily.  30 each  11  . Cholecalciferol (VITAMIN D) 2000 UNITS CAPS Take 1 capsule (2,000 Units total) by mouth once.  30 capsule  11  . fexofenadine (ALLEGRA) 180 MG tablet Take 180 mg by mouth daily.        . finasteride (PROSCAR) 5 MG tablet Take 5 mg by mouth daily. Take 1/2 tab daily      .  fish oil-omega-3 fatty acids 1000 MG capsule Take 2 g by mouth daily.       . hydrOXYzine (ATARAX) 10 MG tablet Take 10 mg by mouth as needed.        . hyoscyamine (LEVSIN SL) 0.125 MG SL tablet Place 1 tablet (0.125 mg total) under the tongue every 4 (four) hours as needed for cramping (smooth muscle spasm).  30 tablet  1  . methocarbamol (ROBAXIN) 500 MG tablet Take 1 tablet (500 mg total) by mouth 2 (two) times daily as needed. Muscle spasm  30 tablet  1  . methylphenidate (RITALIN LA) 30 MG 24 hr capsule Take 1 capsule (30 mg total) by mouth every morning.  30 capsule  0  . methylphenidate (RITALIN) 10 MG tablet Take 1 tablet (10 mg total) by mouth daily at 2 PM daily at 2 PM.  30 tablet  0  . mometasone (NASONEX) 50 MCG/ACT nasal spray Place 2 sprays into the nose daily.  17 g  5  . Multiple Vitamin (MULTIVITAMIN) capsule Take 1 capsule by mouth daily.        . norethindrone-ethinyl estradiol (MICROGESTIN,JUNEL,LOESTRIN) 1-20 MG-MCG tablet Take 1 tablet by mouth daily.  1 Package  11  .  Polysaccharide Iron Complex (POLY-IRON 150 PO) Take 150 mg by mouth 2 (two) times daily.      Marland Kitchen senna (SENOKOT) 8.6 MG TABS Take 1 tablet by mouth daily.      Marland Kitchen spironolactone (ALDACTONE) 25 MG tablet Take 25 mg by mouth daily. Take 2 tabs daily      . tretinoin (RETIN-A) 0.025 % cream       . ValACYclovir HCl (VALTREX PO) Take by mouth as needed.        . zinc gluconate 50 MG tablet Take 50 mg by mouth daily.      . nitrofurantoin (MACRODANTIN) 50 MG capsule As needed       No current facility-administered medications on file prior to visit.    No Known Allergies  Review of Systems  Review of Systems  Constitutional: Negative for fever and malaise/fatigue.  HENT: Negative for congestion.   Eyes: Negative for discharge.  Respiratory: Negative for shortness of breath.   Cardiovascular: Negative for chest pain, palpitations and leg swelling.  Gastrointestinal: Negative for nausea, abdominal pain and diarrhea.  Genitourinary: Negative for dysuria.  Musculoskeletal: Negative for falls.  Skin: Negative for rash.  Neurological: Negative for loss of consciousness and headaches.  Endo/Heme/Allergies: Negative for polydipsia.  Psychiatric/Behavioral: Positive for depression. Negative for suicidal ideas. The patient is nervous/anxious. The patient does not have insomnia.     Objective  BP 90/70  Pulse 71  Temp(Src) 98.3 F (36.8 C) (Oral)  Ht 5' 5.5" (1.664 m)  Wt 114 lb 1.3 oz (51.746 kg)  BMI 18.69 kg/m2  SpO2 97%  LMP 08/23/2013  Physical Exam  Physical Exam  Constitutional: She is oriented to person, place, and time and well-developed, well-nourished, and in no distress. No distress.  HENT:  Head: Normocephalic and atraumatic.  Eyes: Conjunctivae are normal.  Neck: Neck supple. No thyromegaly present.  Cardiovascular: Normal rate, regular rhythm and normal heart sounds.   No murmur heard. Pulmonary/Chest: Effort normal and breath sounds normal. She has no wheezes.   Abdominal: She exhibits no distension and no mass.  Musculoskeletal: She exhibits no edema.  Lymphadenopathy:    She has no cervical adenopathy.  Neurological: She is alert and oriented to person, place, and time.  Skin: Skin  is warm and dry. No rash noted. She is not diaphoretic.  Psychiatric: Memory, affect and judgment normal.    Lab Results  Component Value Date   TSH 0.924 07/03/2013   Lab Results  Component Value Date   WBC 4.9 04/09/2013   HGB 12.6 04/09/2013   HCT 37.4 04/09/2013   MCV 85.2 04/09/2013   PLT 170 04/09/2013   Lab Results  Component Value Date   CREATININE 0.62 04/09/2013   BUN 14 04/09/2013   NA 141 04/09/2013   K 4.2 04/09/2013   CL 105 04/09/2013   CO2 30 04/09/2013   Lab Results  Component Value Date   ALT 9 04/09/2013   AST 12 04/09/2013   ALKPHOS 34* 04/09/2013   BILITOT 0.6 04/09/2013   Lab Results  Component Value Date   CHOL 194 07/03/2013   Lab Results  Component Value Date   HDL 61 07/03/2013   Lab Results  Component Value Date   LDLCALC 122* 07/03/2013   Lab Results  Component Value Date   TRIG 56 07/03/2013   Lab Results  Component Value Date   CHOLHDL 3.2 07/03/2013     Assessment & Plan  Perimenopausal Improved with Loestrin daily, following with GYN  Anxiety and depression Handling her stressors well on Lexapro daily, will increase and cut down on the Alprazolam to bid

## 2013-09-10 NOTE — Assessment & Plan Note (Signed)
Improved with Loestrin daily, following with GYN

## 2013-09-10 NOTE — Assessment & Plan Note (Signed)
Handling her stressors well on Lexapro daily, will increase and cut down on the Alprazolam to bid

## 2013-10-05 ENCOUNTER — Ambulatory Visit (INDEPENDENT_AMBULATORY_CARE_PROVIDER_SITE_OTHER): Payer: BC Managed Care – PPO | Admitting: Family Medicine

## 2013-10-05 ENCOUNTER — Encounter: Payer: Self-pay | Admitting: Family Medicine

## 2013-10-05 VITALS — BP 90/64 | HR 72 | Temp 98.0°F | Ht 65.5 in | Wt 119.1 lb

## 2013-10-05 DIAGNOSIS — F329 Major depressive disorder, single episode, unspecified: Secondary | ICD-10-CM

## 2013-10-05 DIAGNOSIS — F341 Dysthymic disorder: Secondary | ICD-10-CM

## 2013-10-05 MED ORDER — ALPRAZOLAM ER 0.5 MG PO TB24: 0.5000 mg | ORAL_TABLET | Freq: Every day | ORAL | Status: DC

## 2013-10-05 MED ORDER — ESCITALOPRAM OXALATE 20 MG PO TABS: 20.0000 mg | ORAL_TABLET | Freq: Every day | ORAL | Status: DC

## 2013-10-05 NOTE — Progress Notes (Signed)
Pre visit review using our clinic review tool, if applicable. No additional management support is needed unless otherwise documented below in the visit note. 

## 2013-10-06 NOTE — Progress Notes (Signed)
Patient ID: [name], female   DOB: 01/09/1971, 42 y.o.   MRN: [id number] [name] [id number] 1971-04-29 10/06/2013      Progress Note-Follow Up  Subjective  Chief Complaint  Chief Complaint  Patient presents with  . Follow-up    4 week    HPI  Patient is a 42 year old Caucasian female is here in followup. Her stress and anxiety are improving. She's tolerating Lexapro 15 mg daily. Is ready to decrease her alprazolam down to once daily. No new episodes. Continues to struggle in her marriage. No chest pain, palpitations or shortness of breath. Is seeing her counselor routinely.  Past Medical History  Diagnosis Date  . GERD (gastroesophageal reflux disease)   . IBS (irritable bowel syndrome)   . Interstitial cystitis   . Premature atrial contractions 2012  . Adult ADHD     SYMPTOMS ONLY  . Vitamin D deficiency 07/02/2011  . Poor concentration 07/02/2011  . Multiple allergies 07/02/2011  . Anxiety 07/31/2011  . UTI (lower urinary tract infection) 08/28/2011  . Otitis media 03/16/2012  . Shoulder pain, left 04/11/2012  . Alopecia   . Anxiety and depression 07/31/2011  . Depression   . Perimenopausal 07/22/2013    Past Surgical History  Procedure Laterality Date  . Tonsillectomy  1982  . Rhinoplasty  2003  . Breast enhancement surgery  2007  . Cesarean section  2001 and 2004  . Laparoscopy      twice  . Tubal ligation      Family History  Problem Relation Age of Onset  . Heart disease Mother     MVP  . Anxiety disorder Mother   . Hypertension Brother   . Diabetes Brother     PRE DIABETES  . Obesity Brother   . Allergies Daughter   . Asthma Daughter   . Allergies Son   . Asthma Son   . Kidney disease Maternal Grandmother     uremia    History   Social History  . Marital Status: Married    Spouse Name: N/A    Number of Children: N/A  . Years of Education: N/A   Occupational History  . Not on file.   Social History Main Topics  . Smoking status: Never  Smoker   . Smokeless tobacco: Never Used  . Alcohol Use: Yes     Comment: RARELY  . Drug Use: No  . Sexual Activity: Yes    Birth Control/ Protection: Surgical     Comment: Tubal lig   Other Topics Concern  . Not on file   Social History Narrative  . No narrative on file    Current Outpatient Prescriptions on File Prior to Visit  Medication Sig Dispense Refill  . aspirin 81 MG tablet Take 81 mg by mouth daily.        Marland Kitchen AVAR CLEANSER 10-5 % EMUL       . Biotin 2500 MCG CAPS Take 1 capsule by mouth daily.        . Calcium Citrate-Vitamin D 250-200 MG-UNIT TABS Take 1 tablet by mouth daily.  30 each  11  . Cholecalciferol (VITAMIN D) 2000 UNITS CAPS Take 1 capsule (2,000 Units total) by mouth once.  30 capsule  11  . fexofenadine (ALLEGRA) 180 MG tablet Take 180 mg by mouth daily.        . finasteride (PROSCAR) 5 MG tablet Take 5 mg by mouth daily. Take 1/2 tab daily      . fish oil-omega-3  fatty acids 1000 MG capsule Take 2 g by mouth daily.       . hydrOXYzine (ATARAX) 10 MG tablet Take 10 mg by mouth as needed.        . hyoscyamine (LEVSIN SL) 0.125 MG SL tablet Place 1 tablet (0.125 mg total) under the tongue every 4 (four) hours as needed for cramping (smooth muscle spasm).  30 tablet  1  . methocarbamol (ROBAXIN) 500 MG tablet Take 1 tablet (500 mg total) by mouth 2 (two) times daily as needed. Muscle spasm  30 tablet  1  . methylphenidate (RITALIN LA) 30 MG 24 hr capsule Take 1 capsule (30 mg total) by mouth every morning.  30 capsule  0  . methylphenidate (RITALIN) 10 MG tablet Take 1 tablet (10 mg total) by mouth daily at 2 PM daily at 2 PM.  30 tablet  0  . mometasone (NASONEX) 50 MCG/ACT nasal spray Place 2 sprays into the nose daily.  17 g  5  . Multiple Vitamin (MULTIVITAMIN) capsule Take 1 capsule by mouth daily.        . nitrofurantoin (MACRODANTIN) 50 MG capsule As needed      . norethindrone-ethinyl estradiol (MICROGESTIN,JUNEL,LOESTRIN) 1-20 MG-MCG tablet Take 1  tablet by mouth daily.  1 Package  11  . Polysaccharide Iron Complex (POLY-IRON 150 PO) Take 150 mg by mouth 2 (two) times daily.      Marland Kitchen senna (SENOKOT) 8.6 MG TABS Take 1 tablet by mouth daily.      Marland Kitchen spironolactone (ALDACTONE) 25 MG tablet Take 25 mg by mouth daily. Take 2 tabs daily      . tretinoin (RETIN-A) 0.025 % cream       . ValACYclovir HCl (VALTREX PO) Take by mouth as needed.        . zinc gluconate 50 MG tablet Take 50 mg by mouth daily.       No current facility-administered medications on file prior to visit.    No Known Allergies  Review of Systems  Review of Systems  Constitutional: Negative for fever and malaise/fatigue.  HENT: Negative for congestion.   Eyes: Negative for discharge.  Respiratory: Negative for shortness of breath.   Cardiovascular: Negative for chest pain, palpitations and leg swelling.  Gastrointestinal: Negative for nausea, abdominal pain and diarrhea.  Genitourinary: Negative for dysuria.  Musculoskeletal: Negative for falls.  Skin: Negative for rash.  Neurological: Negative for loss of consciousness and headaches.  Endo/Heme/Allergies: Negative for polydipsia.  Psychiatric/Behavioral: Negative for depression and suicidal ideas. The patient is nervous/anxious. The patient does not have insomnia.     Objective  BP 90/64  Pulse 72  Temp(Src) 98 F (36.7 C) (Oral)  Ht 5' 5.5" (1.664 m)  Wt 119 lb 1.9 oz (54.032 kg)  BMI 19.51 kg/m2  SpO2 97%  LMP 09/23/2013  Physical Exam  Physical Exam  Constitutional: She is oriented to person, place, and time and well-developed, well-nourished, and in no distress. No distress.  HENT:  Head: Normocephalic and atraumatic.  Eyes: Conjunctivae are normal.  Neck: Neck supple. No thyromegaly present.  Cardiovascular: Normal rate, regular rhythm and normal heart sounds.   No murmur heard. Pulmonary/Chest: Effort normal and breath sounds normal. She has no wheezes.  Abdominal: She exhibits no  distension and no mass.  Musculoskeletal: She exhibits no edema.  Lymphadenopathy:    She has no cervical adenopathy.  Neurological: She is alert and oriented to person, place, and time.  Skin: Skin is warm and  dry. No rash noted. She is not diaphoretic.  Psychiatric: Memory, affect and judgment normal.    Lab Results  Component Value Date   TSH 0.924 07/03/2013   Lab Results  Component Value Date   WBC 4.9 04/09/2013   HGB 12.6 04/09/2013   HCT 37.4 04/09/2013   MCV 85.2 04/09/2013   PLT 170 04/09/2013   Lab Results  Component Value Date   CREATININE 0.62 04/09/2013   BUN 14 04/09/2013   NA 141 04/09/2013   K 4.2 04/09/2013   CL 105 04/09/2013   CO2 30 04/09/2013   Lab Results  Component Value Date   ALT 9 04/09/2013   AST 12 04/09/2013   ALKPHOS 34* 04/09/2013   BILITOT 0.6 04/09/2013   Lab Results  Component Value Date   CHOL 194 07/03/2013   Lab Results  Component Value Date   HDL 61 07/03/2013   Lab Results  Component Value Date   LDLCALC 122* 07/03/2013   Lab Results  Component Value Date   TRIG 56 07/03/2013   Lab Results  Component Value Date   CHOLHDL 3.2 07/03/2013     Assessment & Plan  Anxiety and depression Has tolerated increase of Lexapro to 15 mg daily agrees to increase to 20 mg daily. Drop the Xanax XR to 0.5 mg daily. Continues with frequent counseling

## 2013-10-06 NOTE — Assessment & Plan Note (Addendum)
Has tolerated increase of Lexapro to 15 mg daily agrees to increase to 20 mg daily. Drop the Xanax XR to 0.5 mg daily. Continues with frequent counseling

## 2013-11-09 ENCOUNTER — Ambulatory Visit: Payer: BC Managed Care – PPO | Admitting: Family Medicine

## 2013-11-13 ENCOUNTER — Ambulatory Visit (INDEPENDENT_AMBULATORY_CARE_PROVIDER_SITE_OTHER): Payer: BC Managed Care – PPO | Admitting: Family Medicine

## 2013-11-13 ENCOUNTER — Encounter: Payer: Self-pay | Admitting: Family Medicine

## 2013-11-13 VITALS — BP 90/62 | HR 75 | Temp 98.5°F | Ht 65.5 in | Wt 122.1 lb

## 2013-11-13 DIAGNOSIS — F411 Generalized anxiety disorder: Secondary | ICD-10-CM

## 2013-11-13 DIAGNOSIS — F341 Dysthymic disorder: Secondary | ICD-10-CM

## 2013-11-13 DIAGNOSIS — N3010 Interstitial cystitis (chronic) without hematuria: Secondary | ICD-10-CM

## 2013-11-13 DIAGNOSIS — F329 Major depressive disorder, single episode, unspecified: Secondary | ICD-10-CM

## 2013-11-13 DIAGNOSIS — F419 Anxiety disorder, unspecified: Secondary | ICD-10-CM

## 2013-11-13 MED ORDER — ALPRAZOLAM 0.25 MG PO TABS: ORAL_TABLET | ORAL | Status: DC

## 2013-11-13 NOTE — Progress Notes (Signed)
Pre visit review using our clinic review tool, if applicable. No additional management support is needed unless otherwise documented below in the visit note. 

## 2013-11-13 NOTE — Progress Notes (Signed)
Patient ID: [name], female   DOB: 01-28-1971, 43 y.o.   MRN: [id number] Ammi Hutt [id number] 1971-02-09 11/13/2013      Progress Note-Follow Up  Subjective  Chief Complaint  Chief Complaint  Patient presents with  . Follow-up    4 week     HPI  Patient is a 43 year old female in today for follow up. Is doing well on current dose of Lexapro and is tolerating the downward titration of Alprazolam well. Continues to attend counseling. Her only frustration is a mild weight gain. She does knowledge that she did stop exercising over the holidays when she had an interstitial cystitis flare however. Stressors at home remain the same if she feels she is managing them better. Denies chest pain or palpitations. No shortness or breath GI concerns noted today. She does acknowledge her appetite is returning.  Past Medical History  Diagnosis Date  . GERD (gastroesophageal reflux disease)   . IBS (irritable bowel syndrome)   . Interstitial cystitis   . Premature atrial contractions 2012  . Adult ADHD     SYMPTOMS ONLY  . Vitamin D deficiency 07/02/2011  . Poor concentration 07/02/2011  . Multiple allergies 07/02/2011  . Anxiety 07/31/2011  . UTI (lower urinary tract infection) 08/28/2011  . Otitis media 03/16/2012  . Shoulder pain, left 04/11/2012  . Alopecia   . Anxiety and depression 07/31/2011  . Depression   . Perimenopausal 07/22/2013    Past Surgical History  Procedure Laterality Date  . Tonsillectomy  1982  . Rhinoplasty  2003  . Breast enhancement surgery  2007  . Cesarean section  2001 and 2004  . Laparoscopy      twice  . Tubal ligation      Family History  Problem Relation Age of Onset  . Heart disease Mother     MVP  . Anxiety disorder Mother   . Hypertension Brother   . Diabetes Brother     PRE DIABETES  . Obesity Brother   . Allergies Daughter   . Asthma Daughter   . Allergies Son   . Asthma Son   . Kidney disease Maternal Grandmother     uremia    History    Social History  . Marital Status: Married    Spouse Name: N/A    Number of Children: N/A  . Years of Education: N/A   Occupational History  . Not on file.   Social History Main Topics  . Smoking status: Never Smoker   . Smokeless tobacco: Never Used  . Alcohol Use: Yes     Comment: RARELY  . Drug Use: No  . Sexual Activity: Yes    Birth Control/ Protection: Surgical     Comment: Tubal lig   Other Topics Concern  . Not on file   Social History Narrative  . No narrative on file    Current Outpatient Prescriptions on File Prior to Visit  Medication Sig Dispense Refill  . aspirin 81 MG tablet Take 81 mg by mouth daily.        Marland Kitchen AVAR CLEANSER 10-5 % EMUL       . Biotin 2500 MCG CAPS Take 1 capsule by mouth daily.        . Calcium Citrate-Vitamin D 250-200 MG-UNIT TABS Take 1 tablet by mouth daily.  30 each  11  . Cholecalciferol (VITAMIN D) 2000 UNITS CAPS Take 1 capsule (2,000 Units total) by mouth once.  30 capsule  11  . escitalopram (  LEXAPRO) 20 MG tablet Take 1 tablet (20 mg total) by mouth daily.  30 tablet  2  . fexofenadine (ALLEGRA) 180 MG tablet Take 180 mg by mouth daily.        . finasteride (PROSCAR) 5 MG tablet Take 5 mg by mouth daily. Take 1/2 tab daily      . fish oil-omega-3 fatty acids 1000 MG capsule Take 2 g by mouth daily.       . hydrOXYzine (ATARAX) 10 MG tablet Take 10 mg by mouth as needed.        . hyoscyamine (LEVSIN SL) 0.125 MG SL tablet Place 1 tablet (0.125 mg total) under the tongue every 4 (four) hours as needed for cramping (smooth muscle spasm).  30 tablet  1  . methocarbamol (ROBAXIN) 500 MG tablet Take 1 tablet (500 mg total) by mouth 2 (two) times daily as needed. Muscle spasm  30 tablet  1  . methylphenidate (RITALIN LA) 30 MG 24 hr capsule Take 1 capsule (30 mg total) by mouth every morning.  30 capsule  0  . methylphenidate (RITALIN) 10 MG tablet Take 1 tablet (10 mg total) by mouth daily at 2 PM daily at 2 PM.  30 tablet  0  .  mometasone (NASONEX) 50 MCG/ACT nasal spray Place 2 sprays into the nose daily.  17 g  5  . Multiple Vitamin (MULTIVITAMIN) capsule Take 1 capsule by mouth daily.        . nitrofurantoin (MACRODANTIN) 50 MG capsule As needed      . norethindrone-ethinyl estradiol (MICROGESTIN,JUNEL,LOESTRIN) 1-20 MG-MCG tablet Take 1 tablet by mouth daily.  1 Package  11  . Polysaccharide Iron Complex (POLY-IRON 150 PO) Take 150 mg by mouth 2 (two) times daily.      Marland Kitchen senna (SENOKOT) 8.6 MG TABS Take 1 tablet by mouth daily.      Marland Kitchen spironolactone (ALDACTONE) 25 MG tablet Take 25 mg by mouth daily. Take 2 tabs daily      . tretinoin (RETIN-A) 0.025 % cream       . ValACYclovir HCl (VALTREX PO) Take by mouth as needed.        . zinc gluconate 50 MG tablet Take 50 mg by mouth daily.       No current facility-administered medications on file prior to visit.    No Known Allergies  Review of Systems  Review of Systems  Constitutional: Negative for fever and malaise/fatigue.  HENT: Negative for congestion.   Eyes: Negative for discharge.  Respiratory: Negative for shortness of breath.   Cardiovascular: Negative for chest pain, palpitations and leg swelling.  Gastrointestinal: Negative for nausea, abdominal pain and diarrhea.  Genitourinary: Negative for dysuria.  Musculoskeletal: Negative for falls.  Skin: Negative for rash.  Neurological: Negative for loss of consciousness and headaches.  Endo/Heme/Allergies: Negative for polydipsia.  Psychiatric/Behavioral: Negative for depression and suicidal ideas. The patient is not nervous/anxious and does not have insomnia.     Objective  BP 90/62  Pulse 75  Temp(Src) 98.5 F (36.9 C) (Oral)  Ht 5' 5.5" (1.664 m)  Wt 122 lb 1.9 oz (55.393 kg)  BMI 20.01 kg/m2  SpO2 97%  LMP 10/16/2013  Physical Exam  Physical Exam  Constitutional: She is oriented to person, place, and time and well-developed, well-nourished, and in no distress. No distress.  HENT:   Head: Normocephalic and atraumatic.  Eyes: Conjunctivae are normal.  Neck: Neck supple. No thyromegaly present.  Cardiovascular: Normal rate, regular rhythm and  normal heart sounds.   No murmur heard. Pulmonary/Chest: Effort normal and breath sounds normal. She has no wheezes.  Abdominal: She exhibits no distension and no mass.  Musculoskeletal: She exhibits no edema.  Lymphadenopathy:    She has no cervical adenopathy.  Neurological: She is alert and oriented to person, place, and time.  Skin: Skin is warm and dry. No rash noted. She is not diaphoretic.  Psychiatric: Memory, affect and judgment normal.    Lab Results  Component Value Date   TSH 0.924 07/03/2013   Lab Results  Component Value Date   WBC 4.9 04/09/2013   HGB 12.6 04/09/2013   HCT 37.4 04/09/2013   MCV 85.2 04/09/2013   PLT 170 04/09/2013   Lab Results  Component Value Date   CREATININE 0.62 04/09/2013   BUN 14 04/09/2013   NA 141 04/09/2013   K 4.2 04/09/2013   CL 105 04/09/2013   CO2 30 04/09/2013   Lab Results  Component Value Date   ALT 9 04/09/2013   AST 12 04/09/2013   ALKPHOS 34* 04/09/2013   BILITOT 0.6 04/09/2013   Lab Results  Component Value Date   CHOL 194 07/03/2013   Lab Results  Component Value Date   HDL 61 07/03/2013   Lab Results  Component Value Date   LDLCALC 122* 07/03/2013   Lab Results  Component Value Date   TRIG 56 07/03/2013   Lab Results  Component Value Date   CHOLHDL 3.2 07/03/2013     Assessment & Plan  Interstitial cystitis Has had recent flare. Is following closely with urologist and improving.   Anxiety and depression Responding to Lexapro but frustrated with weight gain. Will continue to monitor and patient has just started back to the gym after taking a break during the holidays. Will continue to titrate down the Alprazolam with patient. She continues to worrk with her counselor

## 2013-11-16 ENCOUNTER — Other Ambulatory Visit: Payer: Self-pay | Admitting: Oral and Maxillofacial Surgery

## 2013-11-18 NOTE — Assessment & Plan Note (Signed)
Has had recent flare. Is following closely with urologist and improving.

## 2013-11-18 NOTE — Assessment & Plan Note (Addendum)
Responding to Lexapro but frustrated with weight gain. Will continue to monitor and patient has just started back to the gym after taking a break during the holidays. Will continue to titrate down the Alprazolam with patient. She continues to worrk with her counselor

## 2013-11-21 ENCOUNTER — Other Ambulatory Visit: Payer: Self-pay

## 2013-11-21 MED ORDER — VALACYCLOVIR HCL 500 MG PO TABS: 500.0000 mg | ORAL_TABLET | Freq: Every day | ORAL | Status: DC

## 2013-11-28 ENCOUNTER — Other Ambulatory Visit: Payer: Self-pay | Admitting: Family Medicine

## 2013-12-03 ENCOUNTER — Telehealth: Payer: Self-pay | Admitting: Family Medicine

## 2013-12-03 NOTE — Telephone Encounter (Signed)
Please advise 

## 2013-12-03 NOTE — Telephone Encounter (Signed)
OK to d/c Ritalin and start Adderall 10 mg po bid disp #60 til seen

## 2013-12-03 NOTE — Telephone Encounter (Signed)
Patient states that she would like to start adderall and she states that Dr. Charlett Blake told her that she would write her an rx for this before her next appointment.

## 2013-12-04 MED ORDER — AMPHETAMINE-DEXTROAMPHETAMINE 10 MG PO TABS: 10.0000 mg | ORAL_TABLET | Freq: Two times a day (BID) | ORAL | Status: DC

## 2013-12-04 NOTE — Telephone Encounter (Signed)
Pt informed that RX can be picked up after 10 am this morning

## 2013-12-13 ENCOUNTER — Telehealth: Payer: Self-pay | Admitting: *Deleted

## 2013-12-13 NOTE — Telephone Encounter (Signed)
Pt called c/o breakthrough bleeding with new birth control pills Loestrin 1/20. Pt started pills back in Oct 2014. Pt said only spotting will watch for now and call back if needed.

## 2013-12-14 ENCOUNTER — Ambulatory Visit: Payer: BC Managed Care – PPO | Admitting: Family Medicine

## 2014-01-01 ENCOUNTER — Other Ambulatory Visit: Payer: Self-pay | Admitting: Family Medicine

## 2014-01-04 ENCOUNTER — Ambulatory Visit (INDEPENDENT_AMBULATORY_CARE_PROVIDER_SITE_OTHER): Payer: BC Managed Care – PPO | Admitting: Gynecology

## 2014-01-04 ENCOUNTER — Telehealth: Payer: Self-pay | Admitting: *Deleted

## 2014-01-04 ENCOUNTER — Encounter: Payer: Self-pay | Admitting: Gynecology

## 2014-01-04 ENCOUNTER — Ambulatory Visit: Payer: BC Managed Care – PPO | Admitting: Family Medicine

## 2014-01-04 DIAGNOSIS — N63 Unspecified lump in unspecified breast: Secondary | ICD-10-CM

## 2014-01-04 DIAGNOSIS — N921 Excessive and frequent menstruation with irregular cycle: Secondary | ICD-10-CM

## 2014-01-04 NOTE — Patient Instructions (Signed)
Office will call you to arrange ultrasound mammogram.

## 2014-01-04 NOTE — Progress Notes (Signed)
Patient ID: [name], female   DOB: July 07, 1971, 43 y.o.   MRN: [id number] [name] Dec 12, 1970 [id number]        43 y.o.  [id number] presents with 2 issues:  1. Small left breast mass felt last night while doing SBE. Never noticed before. No tenderness, nipple discharge or other symptoms. Last mammogram June 2014 normal. 2. Breakthrough bleeding. Patient had spotting the end of her last cycle week 3 of her BCPs. Started a new pack and then missed a day and had spotting the day after. No discomfort or other irregular bleeding.  Past medical history,surgical history, problem list, medications, allergies, family history and social history were all reviewed and documented in the EPIC chart.  Exam: [name] assistant General appearance  Normal Both breasts examined lying and sitting. Bilateral implants noted. Right without masses, retractions, discharge, adenopathy. Left with small granular size mass 2:30 position periphery of the breast in the tail of Sp[place]. No overlying skin changes, nipple discharge or axillary adenopathy. Physical Exam  Pulmonary/Chest:       Assessment/Plan:  43 y.o. [id number] :  1. Small breast nodularity. I think this is physiologic tissue that's probably always been there and she just found it because it is fairly small. Regardless we certainly need to followup on this and we'll plan diagnostic mammography and ultrasound over this area. Assuming negative then we'll plan expectant followup. Patient knows we will contact her to arrange the studies. 2. Breakthrough bleeding. Spontaneous first time, due to missed pill second time. We'll plan expectant management for now. If would continue I discussed switching to a slightly higher dose pill at this point she is comfortable with continuing and observing.   Note: This document was prepared with digital dictation and possible smart phrase technology. Any transcriptional errors that result from this process are  unintentional.   Anastasio Auerbach MD, 10:59 AM 01/04/2014

## 2014-01-04 NOTE — Telephone Encounter (Signed)
Message copied by Thamas Jaegers on Fri Jan 04, 2014  2:55 PM ------      Message from: Anastasio Auerbach      Created: Fri Jan 04, 2014 11:22 AM       Schedule diagnostic mammography and ultrasound reference newly found left breast mass, small at 2:30 position periphery of breast. ------

## 2014-01-04 NOTE — Telephone Encounter (Signed)
Order placed for the below, they will contact patient to schedule.

## 2014-01-09 NOTE — Telephone Encounter (Signed)
Appointment 01/14/14 @ 2:30 pm

## 2014-01-14 ENCOUNTER — Ambulatory Visit
Admission: RE | Admit: 2014-01-14 | Discharge: 2014-01-14 | Disposition: A | Discharge status: Home / Self Care | Payer: BC Managed Care – PPO | Source: Ambulatory Visit | Attending: Gynecology | Admitting: Gynecology

## 2014-01-14 DIAGNOSIS — N63 Unspecified lump in unspecified breast: Secondary | ICD-10-CM

## 2014-01-24 ENCOUNTER — Ambulatory Visit (INDEPENDENT_AMBULATORY_CARE_PROVIDER_SITE_OTHER): Payer: BC Managed Care – PPO | Admitting: Family Medicine

## 2014-01-24 ENCOUNTER — Encounter: Payer: Self-pay | Admitting: Family Medicine

## 2014-01-24 VITALS — BP 102/64 | HR 75 | Temp 97.0°F | Ht 65.5 in | Wt 124.0 lb

## 2014-01-24 DIAGNOSIS — E559 Vitamin D deficiency, unspecified: Secondary | ICD-10-CM

## 2014-01-24 DIAGNOSIS — F32A Depression, unspecified: Secondary | ICD-10-CM

## 2014-01-24 DIAGNOSIS — R5383 Other fatigue: Secondary | ICD-10-CM

## 2014-01-24 DIAGNOSIS — Z79899 Other long term (current) drug therapy: Secondary | ICD-10-CM

## 2014-01-24 DIAGNOSIS — K219 Gastro-esophageal reflux disease without esophagitis: Secondary | ICD-10-CM

## 2014-01-24 DIAGNOSIS — R5381 Other malaise: Secondary | ICD-10-CM

## 2014-01-24 DIAGNOSIS — F419 Anxiety disorder, unspecified: Secondary | ICD-10-CM

## 2014-01-24 DIAGNOSIS — F329 Major depressive disorder, single episode, unspecified: Secondary | ICD-10-CM

## 2014-01-24 DIAGNOSIS — F341 Dysthymic disorder: Secondary | ICD-10-CM

## 2014-01-24 DIAGNOSIS — R41840 Attention and concentration deficit: Secondary | ICD-10-CM

## 2014-01-24 DIAGNOSIS — F909 Attention-deficit hyperactivity disorder, unspecified type: Secondary | ICD-10-CM

## 2014-01-24 DIAGNOSIS — N3010 Interstitial cystitis (chronic) without hematuria: Secondary | ICD-10-CM

## 2014-01-24 LAB — CBC
HCT: 39.2 % (ref 36.0–46.0)
HEMOGLOBIN: 13.3 g/dL (ref 12.0–15.0)
MCH: 29.3 pg (ref 26.0–34.0)
MCHC: 33.9 g/dL (ref 30.0–36.0)
MCV: 86.3 fL (ref 78.0–100.0)
Platelets: 239 10*3/uL (ref 150–400)
RBC: 4.54 MIL/uL (ref 3.87–5.11)
RDW: 12.9 % (ref 11.5–15.5)
WBC: 6.6 10*3/uL (ref 4.0–10.5)

## 2014-01-24 LAB — RENAL FUNCTION PANEL
ALBUMIN: 4.6 g/dL (ref 3.5–5.2)
BUN: 21 mg/dL (ref 6–23)
CO2: 27 mEq/L (ref 19–32)
Calcium: 9.2 mg/dL (ref 8.4–10.5)
Chloride: 103 mEq/L (ref 96–112)
Creat: 0.69 mg/dL (ref 0.50–1.10)
Glucose, Bld: 97 mg/dL (ref 70–99)
Phosphorus: 3.3 mg/dL (ref 2.3–4.6)
Potassium: 3.8 mEq/L (ref 3.5–5.3)
SODIUM: 136 meq/L (ref 135–145)

## 2014-01-24 LAB — HEPATIC FUNCTION PANEL
ALBUMIN: 4.6 g/dL (ref 3.5–5.2)
ALT: 13 U/L (ref 0–35)
AST: 18 U/L (ref 0–37)
Alkaline Phosphatase: 38 U/L — ABNORMAL LOW (ref 39–117)
BILIRUBIN DIRECT: 0.1 mg/dL (ref 0.0–0.3)
Indirect Bilirubin: 0.3 mg/dL (ref 0.2–1.2)
Total Bilirubin: 0.4 mg/dL (ref 0.2–1.2)
Total Protein: 6.8 g/dL (ref 6.0–8.3)

## 2014-01-24 LAB — T4, FREE: Free T4: 1.17 ng/dL (ref 0.80–1.80)

## 2014-01-24 LAB — FERRITIN: FERRITIN: 20 ng/mL (ref 10–291)

## 2014-01-24 LAB — TSH: TSH: 1.218 u[IU]/mL (ref 0.350–4.500)

## 2014-01-24 MED ORDER — AMPHETAMINE-DEXTROAMPHETAMINE 20 MG PO TABS: 20.0000 mg | ORAL_TABLET | Freq: Two times a day (BID) | ORAL | Status: DC

## 2014-01-24 NOTE — Patient Instructions (Signed)

## 2014-01-24 NOTE — Progress Notes (Signed)
Pre visit review using our clinic review tool, if applicable. No additional management support is needed unless otherwise documented below in the visit note. 

## 2014-01-25 LAB — VITAMIN D 25 HYDROXY (VIT D DEFICIENCY, FRACTURES): VIT D 25 HYDROXY: 48 ng/mL (ref 30–89)

## 2014-01-27 ENCOUNTER — Encounter: Payer: Self-pay | Admitting: Family Medicine

## 2014-01-27 DIAGNOSIS — R5383 Other fatigue: Secondary | ICD-10-CM | POA: Insufficient documentation

## 2014-01-27 DIAGNOSIS — R5381 Other malaise: Secondary | ICD-10-CM | POA: Insufficient documentation

## 2014-01-27 NOTE — Assessment & Plan Note (Signed)
Found Adderall helpful but only partially will increase to 20 mg po bid.

## 2014-01-27 NOTE — Assessment & Plan Note (Addendum)
Likely multifactorial labs unremarkable, will monitor

## 2014-01-27 NOTE — Progress Notes (Signed)
Patient ID: [name], female   DOB: 12-Sep-1971, 43 y.o.   MRN: [id number] [name] [id number] 1970-12-01 01/27/2014      Progress Note-Follow Up  Subjective  Chief Complaint  Chief Complaint  Patient presents with  . Follow-up    4 week    HPI  Patient is a 43 year old female in today for routine medical care. She is in today for followup. She reports her low mood and anxiety are improved with the current dose of Lexapro. Her greatest complaint today's fatigue. She sleeps numerous hours and still feels very tired every day. Continues to have trouble concentrating. Denies any snoring or headache. Denies any other acute complaints. Denies CP/palp/SOB/HA/congestion/fevers/GI or GU c/o. Taking meds as prescribed  Past Medical History  Diagnosis Date  . GERD (gastroesophageal reflux disease)   . IBS (irritable bowel syndrome)   . Interstitial cystitis   . Premature atrial contractions 2012  . Adult ADHD     SYMPTOMS ONLY  . Vitamin D deficiency 07/02/2011  . Poor concentration 07/02/2011  . Multiple allergies 07/02/2011  . Anxiety 07/31/2011  . UTI (lower urinary tract infection) 08/28/2011  . Otitis media 03/16/2012  . Shoulder pain, left 04/11/2012  . Alopecia   . Anxiety and depression 07/31/2011  . Depression   . Perimenopausal 07/22/2013    Past Surgical History  Procedure Laterality Date  . Tonsillectomy  1982  . Rhinoplasty  2003  . Breast enhancement surgery  2007  . Cesarean section  2001 and 2004  . Laparoscopy      twice  . Tubal ligation      Family History  Problem Relation Age of Onset  . Heart disease Mother     MVP  . Anxiety disorder Mother   . Hypertension Brother   . Diabetes Brother     PRE DIABETES  . Obesity Brother   . Allergies Daughter   . Asthma Daughter   . Allergies Son   . Asthma Son   . Kidney disease Maternal Grandmother     uremia    History   Social History  . Marital Status: Married    Spouse Name: N/A    Number of  Children: N/A  . Years of Education: N/A   Occupational History  . Not on file.   Social History Main Topics  . Smoking status: Never Smoker   . Smokeless tobacco: Never Used  . Alcohol Use: Yes     Comment: RARELY  . Drug Use: No  . Sexual Activity: Yes    Birth Control/ Protection: Surgical     Comment: Tubal lig   Other Topics Concern  . Not on file   Social History Narrative  . No narrative on file    Current Outpatient Prescriptions on File Prior to Visit  Medication Sig Dispense Refill  . aspirin 81 MG tablet Take 81 mg by mouth daily.        Marland Kitchen AVAR CLEANSER 10-5 % EMUL       . Biotin 2500 MCG CAPS Take 1 capsule by mouth daily.        . Calcium Citrate-Vitamin D 250-200 MG-UNIT TABS Take 1 tablet by mouth daily.  30 each  11  . Cholecalciferol (VITAMIN D) 2000 UNITS CAPS Take 1 capsule (2,000 Units total) by mouth once.  30 capsule  11  . escitalopram (LEXAPRO) 20 MG tablet Take 1 tablet (20 mg total) by mouth daily.  30 tablet  2  . fexofenadine (  ALLEGRA) 180 MG tablet Take 180 mg by mouth daily.        . finasteride (PROSCAR) 5 MG tablet Take 5 mg by mouth daily. Take 1/2 tab daily      . fish oil-omega-3 fatty acids 1000 MG capsule Take 2 g by mouth daily.       . mometasone (NASONEX) 50 MCG/ACT nasal spray Place 2 sprays into the nose daily.  17 g  5  . Multiple Vitamin (MULTIVITAMIN) capsule Take 1 capsule by mouth daily.        . nitrofurantoin (MACRODANTIN) 50 MG capsule As needed      . norethindrone-ethinyl estradiol (MICROGESTIN,JUNEL,LOESTRIN) 1-20 MG-MCG tablet Take 1 tablet by mouth daily.  1 Package  11  . Polysaccharide Iron Complex (POLY-IRON 150 PO) Take 150 mg by mouth 2 (two) times daily.      Marland Kitchen senna (SENOKOT) 8.6 MG TABS Take 1 tablet by mouth daily.      Marland Kitchen spironolactone (ALDACTONE) 25 MG tablet Take 25 mg by mouth daily. Take 2 tabs daily      . tretinoin (RETIN-A) 0.025 % cream       . valACYclovir (VALTREX) 500 MG tablet Take 1 tablet (500 mg  total) by mouth daily.  30 tablet  2  . zinc gluconate 50 MG tablet Take 50 mg by mouth daily.       No current facility-administered medications on file prior to visit.    No Known Allergies  Review of Systems  Review of Systems  Constitutional: Positive for malaise/fatigue. Negative for fever.  HENT: Negative for congestion.   Eyes: Negative for discharge.  Respiratory: Negative for shortness of breath.   Cardiovascular: Negative for chest pain, palpitations and leg swelling.  Gastrointestinal: Negative for nausea, abdominal pain and diarrhea.  Genitourinary: Negative for dysuria.  Musculoskeletal: Negative for falls.  Skin: Negative for rash.  Neurological: Negative for loss of consciousness and headaches.  Endo/Heme/Allergies: Negative for polydipsia.  Psychiatric/Behavioral: Negative for depression and suicidal ideas. The patient is not nervous/anxious and does not have insomnia.     Objective  BP 102/64  Pulse 75  Temp(Src) 97 F (36.1 C) (Oral)  Ht 5' 5.5" (1.664 m)  Wt 124 lb 0.6 oz (56.264 kg)  BMI 20.32 kg/m2  SpO2 97%  LMP 01/08/2014  Physical Exam  Physical Exam  Constitutional: She is oriented to person, place, and time and well-developed, well-nourished, and in no distress. No distress.  HENT:  Head: Normocephalic and atraumatic.  Eyes: Conjunctivae are normal.  Neck: Neck supple. No thyromegaly present.  Cardiovascular: Normal rate, regular rhythm and normal heart sounds.   No murmur heard. Pulmonary/Chest: Effort normal and breath sounds normal. She has no wheezes.  Abdominal: She exhibits no distension and no mass.  Musculoskeletal: She exhibits no edema.  Lymphadenopathy:    She has no cervical adenopathy.  Neurological: She is alert and oriented to person, place, and time.  Skin: Skin is warm and dry. No rash noted. She is not diaphoretic.  Psychiatric: Memory, affect and judgment normal.    Lab Results  Component Value Date   TSH 1.218  01/24/2014   Lab Results  Component Value Date   WBC 6.6 01/24/2014   HGB 13.3 01/24/2014   HCT 39.2 01/24/2014   MCV 86.3 01/24/2014   PLT 239 01/24/2014   Lab Results  Component Value Date   CREATININE 0.69 01/24/2014   BUN 21 01/24/2014   NA 136 01/24/2014   K 3.8  01/24/2014   CL 103 01/24/2014   CO2 27 01/24/2014   Lab Results  Component Value Date   ALT 13 01/24/2014   AST 18 01/24/2014   ALKPHOS 38* 01/24/2014   BILITOT 0.4 01/24/2014   Lab Results  Component Value Date   CHOL 194 07/03/2013   Lab Results  Component Value Date   HDL 61 07/03/2013   Lab Results  Component Value Date   LDLCALC 122* 07/03/2013   Lab Results  Component Value Date   TRIG 56 07/03/2013   Lab Results  Component Value Date   CHOLHDL 3.2 07/03/2013     Assessment & Plan  Anxiety and depression Improved at present on current meds and with counseling. No changes  Poor concentration Found Adderall helpful but only partially will increase to 20 mg po bid.  Other malaise and fatigue Likely multifactorial labs unremarkable, will monitor

## 2014-01-27 NOTE — Assessment & Plan Note (Signed)
Improved at present on current meds and with counseling. No changes

## 2014-02-21 ENCOUNTER — Ambulatory Visit: Payer: BC Managed Care – PPO | Admitting: Family Medicine

## 2014-02-26 ENCOUNTER — Ambulatory Visit (INDEPENDENT_AMBULATORY_CARE_PROVIDER_SITE_OTHER): Payer: BC Managed Care – PPO | Admitting: Family Medicine

## 2014-02-26 ENCOUNTER — Encounter: Payer: Self-pay | Admitting: Family Medicine

## 2014-02-26 VITALS — BP 100/72 | HR 76 | Temp 98.3°F | Ht 65.5 in | Wt 123.0 lb

## 2014-02-26 DIAGNOSIS — F909 Attention-deficit hyperactivity disorder, unspecified type: Secondary | ICD-10-CM

## 2014-02-26 DIAGNOSIS — F329 Major depressive disorder, single episode, unspecified: Secondary | ICD-10-CM

## 2014-02-26 DIAGNOSIS — F419 Anxiety disorder, unspecified: Secondary | ICD-10-CM

## 2014-02-26 DIAGNOSIS — R41840 Attention and concentration deficit: Secondary | ICD-10-CM

## 2014-02-26 DIAGNOSIS — N3010 Interstitial cystitis (chronic) without hematuria: Secondary | ICD-10-CM

## 2014-02-26 DIAGNOSIS — F341 Dysthymic disorder: Secondary | ICD-10-CM

## 2014-02-26 DIAGNOSIS — F411 Generalized anxiety disorder: Secondary | ICD-10-CM

## 2014-02-26 DIAGNOSIS — F32A Depression, unspecified: Secondary | ICD-10-CM

## 2014-02-26 DIAGNOSIS — R002 Palpitations: Secondary | ICD-10-CM

## 2014-02-26 MED ORDER — ESCITALOPRAM OXALATE 20 MG PO TABS: 20.0000 mg | ORAL_TABLET | Freq: Every day | ORAL | Status: DC

## 2014-02-26 MED ORDER — AMPHETAMINE-DEXTROAMPHETAMINE 20 MG PO TABS: 20.0000 mg | ORAL_TABLET | Freq: Two times a day (BID) | ORAL | Status: DC

## 2014-02-26 NOTE — Progress Notes (Signed)
Pre visit review using our clinic review tool, if applicable. No additional management support is needed unless otherwise documented below in the visit note. 

## 2014-03-03 ENCOUNTER — Encounter: Payer: Self-pay | Admitting: Family Medicine

## 2014-03-03 NOTE — Progress Notes (Signed)
Patient ID: [name], female   DOB: 11/21/1970, 43 y.o.   MRN: [id number] [name] [id number] 11-May-1971 03/03/2014      Progress Note-Follow Up  Subjective  Chief Complaint  Chief Complaint  Patient presents with  . Follow-up    HPI  Patient is a 43 year old female in today for routine medical care. She is doing much better she reports Adderall helps her concentration fatigue. She has been able to start working out again. Her anxiety is improved. She denies any severe depression. Feels her medications are helpful. Denies CP/palp/SOB/HA/congestion/fevers/GI or GU c/o. Taking meds as prescribed  Past Medical History  Diagnosis Date  . GERD (gastroesophageal reflux disease)   . IBS (irritable bowel syndrome)   . Interstitial cystitis   . Premature atrial contractions 2012  . Adult ADHD     SYMPTOMS ONLY  . Vitamin D deficiency 07/02/2011  . Poor concentration 07/02/2011  . Multiple allergies 07/02/2011  . Anxiety 07/31/2011  . UTI (lower urinary tract infection) 08/28/2011  . Otitis media 03/16/2012  . Shoulder pain, left 04/11/2012  . Alopecia   . Anxiety and depression 07/31/2011  . Depression   . Perimenopausal 07/22/2013    Past Surgical History  Procedure Laterality Date  . Tonsillectomy  1982  . Rhinoplasty  2003  . Breast enhancement surgery  2007  . Cesarean section  2001 and 2004  . Laparoscopy      twice  . Tubal ligation      Family History  Problem Relation Age of Onset  . Heart disease Mother     MVP  . Anxiety disorder Mother   . Hypertension Brother   . Diabetes Brother     PRE DIABETES  . Obesity Brother   . Allergies Daughter   . Asthma Daughter   . Allergies Son   . Asthma Son   . Kidney disease Maternal Grandmother     uremia    History   Social History  . Marital Status: Married    Spouse Name: N/A    Number of Children: N/A  . Years of Education: N/A   Occupational History  . Not on file.   Social History Main Topics  .  Smoking status: Never Smoker   . Smokeless tobacco: Never Used  . Alcohol Use: Yes     Comment: RARELY  . Drug Use: No  . Sexual Activity: Yes    Birth Control/ Protection: Surgical     Comment: Tubal lig   Other Topics Concern  . Not on file   Social History Narrative  . No narrative on file    Current Outpatient Prescriptions on File Prior to Visit  Medication Sig Dispense Refill  . aspirin 81 MG tablet Take 81 mg by mouth daily.        Marland Kitchen AVAR CLEANSER 10-5 % EMUL       . Biotin 2500 MCG CAPS Take 1 capsule by mouth daily.        . Calcium Citrate-Vitamin D 250-200 MG-UNIT TABS Take 1 tablet by mouth daily.  30 each  11  . Cholecalciferol (VITAMIN D) 2000 UNITS CAPS Take 1 capsule (2,000 Units total) by mouth once.  30 capsule  11  . fexofenadine (ALLEGRA) 180 MG tablet Take 180 mg by mouth daily.        . finasteride (PROSCAR) 5 MG tablet Take 5 mg by mouth daily. Take 1/2 tab daily      . fish oil-omega-3 fatty acids  1000 MG capsule Take 2 g by mouth daily.       . mometasone (NASONEX) 50 MCG/ACT nasal spray Place 2 sprays into the nose daily.  17 g  5  . Multiple Vitamin (MULTIVITAMIN) capsule Take 1 capsule by mouth daily.        . nitrofurantoin (MACRODANTIN) 50 MG capsule As needed      . norethindrone-ethinyl estradiol (MICROGESTIN,JUNEL,LOESTRIN) 1-20 MG-MCG tablet Take 1 tablet by mouth daily.  1 Package  11  . Polysaccharide Iron Complex (POLY-IRON 150 PO) Take 150 mg by mouth 2 (two) times daily.      Marland Kitchen senna (SENOKOT) 8.6 MG TABS Take 1 tablet by mouth daily.      Marland Kitchen spironolactone (ALDACTONE) 25 MG tablet Take 25 mg by mouth daily. Take 2 tabs daily      . tretinoin (RETIN-A) 0.025 % cream       . valACYclovir (VALTREX) 500 MG tablet Take 1 tablet (500 mg total) by mouth daily.  30 tablet  2  . zinc gluconate 50 MG tablet Take 50 mg by mouth daily.       No current facility-administered medications on file prior to visit.    No Known Allergies  Review of  Systems  Review of Systems  Constitutional: Negative for fever and malaise/fatigue.  HENT: Negative for congestion.   Eyes: Negative for discharge.  Respiratory: Negative for shortness of breath.   Cardiovascular: Negative for chest pain, palpitations and leg swelling.  Gastrointestinal: Negative for nausea, abdominal pain and diarrhea.  Genitourinary: Negative for dysuria.  Musculoskeletal: Negative for falls.  Skin: Negative for rash.  Neurological: Negative for loss of consciousness and headaches.  Endo/Heme/Allergies: Negative for polydipsia.  Psychiatric/Behavioral: Negative for depression and suicidal ideas. The patient is nervous/anxious. The patient does not have insomnia.     Objective  BP 100/72  Pulse 76  Temp(Src) 98.3 F (36.8 C) (Oral)  Ht 5' 5.5" (1.664 m)  Wt 123 lb (55.792 kg)  BMI 20.15 kg/m2  SpO2 98%  LMP 02/12/2014  Physical Exam  Physical Exam  Constitutional: She is oriented to person, place, and time and well-developed, well-nourished, and in no distress. No distress.  HENT:  Head: Normocephalic and atraumatic.  Eyes: Conjunctivae are normal.  Neck: Neck supple. No thyromegaly present.  Cardiovascular: Normal rate, regular rhythm and normal heart sounds.   No murmur heard. Pulmonary/Chest: Effort normal and breath sounds normal. She has no wheezes.  Abdominal: She exhibits no distension and no mass.  Musculoskeletal: She exhibits no edema.  Lymphadenopathy:    She has no cervical adenopathy.  Neurological: She is alert and oriented to person, place, and time.  Skin: Skin is warm and dry. No rash noted. She is not diaphoretic.  Psychiatric: Memory, affect and judgment normal.    Lab Results  Component Value Date   TSH 1.218 01/24/2014   Lab Results  Component Value Date   WBC 6.6 01/24/2014   HGB 13.3 01/24/2014   HCT 39.2 01/24/2014   MCV 86.3 01/24/2014   PLT 239 01/24/2014   Lab Results  Component Value Date   CREATININE 0.69  01/24/2014   BUN 21 01/24/2014   NA 136 01/24/2014   K 3.8 01/24/2014   CL 103 01/24/2014   CO2 27 01/24/2014   Lab Results  Component Value Date   ALT 13 01/24/2014   AST 18 01/24/2014   ALKPHOS 38* 01/24/2014   BILITOT 0.4 01/24/2014   Lab Results  Component Value  Date   CHOL 194 07/03/2013   Lab Results  Component Value Date   HDL 61 07/03/2013   Lab Results  Component Value Date   LDLCALC 122* 07/03/2013   Lab Results  Component Value Date   TRIG 56 07/03/2013   Lab Results  Component Value Date   CHOLHDL 3.2 07/03/2013     Assessment & Plan  Poor concentration Good response to adderall at current doses, given refills  Palpitations No recent episodes  Interstitial cystitis Stable again  Anxiety and depression Good response to Lexapro continue the same

## 2014-03-03 NOTE — Assessment & Plan Note (Signed)
Stable again

## 2014-03-03 NOTE — Assessment & Plan Note (Signed)
Good response to Lexapro continue the same

## 2014-03-03 NOTE — Assessment & Plan Note (Signed)
No recent episodes

## 2014-03-03 NOTE — Assessment & Plan Note (Signed)
Good response to adderall at current doses, given refills

## 2014-04-05 ENCOUNTER — Telehealth: Payer: Self-pay

## 2014-04-05 NOTE — Telephone Encounter (Signed)
Pt called stating that she has been having some anxiety issues (husband asked for a divorce). Pt states she took an anxiety medication in the past and would like to take it again but not sure on the dose?  I left a message for patient to return our call to let us know the name of the medication.

## 2014-04-09 NOTE — Telephone Encounter (Signed)
Left another message for patient to return my call.

## 2014-04-18 NOTE — Telephone Encounter (Signed)
Closing note until patient returns call. 

## 2014-05-14 ENCOUNTER — Other Ambulatory Visit: Payer: Self-pay

## 2014-05-14 DIAGNOSIS — Z1231 Encounter for screening mammogram for malignant neoplasm of breast: Secondary | ICD-10-CM

## 2014-05-23 ENCOUNTER — Ambulatory Visit
Admission: RE | Admit: 2014-05-23 | Discharge: 2014-05-23 | Disposition: A | Discharge status: Home / Self Care | Payer: BC Managed Care – PPO | Source: Ambulatory Visit

## 2014-05-23 DIAGNOSIS — Z1231 Encounter for screening mammogram for malignant neoplasm of breast: Secondary | ICD-10-CM

## 2014-05-27 ENCOUNTER — Ambulatory Visit: Payer: BC Managed Care – PPO | Admitting: Family Medicine

## 2014-07-05 ENCOUNTER — Encounter: Payer: Self-pay | Admitting: Family Medicine

## 2014-07-05 ENCOUNTER — Ambulatory Visit (INDEPENDENT_AMBULATORY_CARE_PROVIDER_SITE_OTHER): Payer: BC Managed Care – PPO | Admitting: Family Medicine

## 2014-07-05 VITALS — BP 100/78 | HR 66 | Temp 98.4°F | Ht 65.5 in | Wt 125.4 lb

## 2014-07-05 DIAGNOSIS — E559 Vitamin D deficiency, unspecified: Secondary | ICD-10-CM

## 2014-07-05 DIAGNOSIS — K219 Gastro-esophageal reflux disease without esophagitis: Secondary | ICD-10-CM

## 2014-07-05 DIAGNOSIS — F329 Major depressive disorder, single episode, unspecified: Secondary | ICD-10-CM

## 2014-07-05 DIAGNOSIS — F419 Anxiety disorder, unspecified: Secondary | ICD-10-CM

## 2014-07-05 DIAGNOSIS — E785 Hyperlipidemia, unspecified: Secondary | ICD-10-CM

## 2014-07-05 DIAGNOSIS — F341 Dysthymic disorder: Secondary | ICD-10-CM

## 2014-07-05 DIAGNOSIS — R5381 Other malaise: Secondary | ICD-10-CM

## 2014-07-05 DIAGNOSIS — R002 Palpitations: Secondary | ICD-10-CM

## 2014-07-05 DIAGNOSIS — R41840 Attention and concentration deficit: Secondary | ICD-10-CM

## 2014-07-05 DIAGNOSIS — R5383 Other fatigue: Secondary | ICD-10-CM

## 2014-07-05 LAB — LIPID PANEL
CHOLESTEROL: 230 mg/dL — AB (ref 0–200)
HDL: 49.5 mg/dL (ref >39.00)
LDL Cholesterol: 165 mg/dL — ABNORMAL HIGH (ref 0–99)
NonHDL: 180.5
Total CHOL/HDL Ratio: 5
Triglycerides: 77 mg/dL (ref 0.0–149.0)
VLDL: 15.4 mg/dL (ref 0.0–40.0)

## 2014-07-05 LAB — CBC
HCT: 41.5 % (ref 36.0–46.0)
HEMOGLOBIN: 13.9 g/dL (ref 12.0–15.0)
MCHC: 33.4 g/dL (ref 30.0–36.0)
MCV: 89.3 fl (ref 78.0–100.0)
PLATELETS: 230 10*3/uL (ref 150.0–400.0)
RBC: 4.64 Mil/uL (ref 3.87–5.11)
RDW: 12.7 % (ref 11.5–15.5)
WBC: 4.9 10*3/uL (ref 4.0–10.5)

## 2014-07-05 LAB — RENAL FUNCTION PANEL
Albumin: 4.2 g/dL (ref 3.5–5.2)
BUN: 18 mg/dL (ref 6–23)
CO2: 27 mEq/L (ref 19–32)
Calcium: 9.4 mg/dL (ref 8.4–10.5)
Chloride: 103 mEq/L (ref 96–112)
Creatinine, Ser: 0.7 mg/dL (ref 0.4–1.2)
GFR: 100.89 mL/min (ref >60.00)
Glucose, Bld: 89 mg/dL (ref 70–99)
PHOSPHORUS: 3.4 mg/dL (ref 2.3–4.6)
Potassium: 4.5 mEq/L (ref 3.5–5.1)
Sodium: 137 mEq/L (ref 135–145)

## 2014-07-05 LAB — HEPATIC FUNCTION PANEL
ALBUMIN: 4.2 g/dL (ref 3.5–5.2)
ALK PHOS: 37 U/L — AB (ref 39–117)
ALT: 18 U/L (ref 0–35)
AST: 20 U/L (ref 0–37)
Bilirubin, Direct: 0.1 mg/dL (ref 0.0–0.3)
Total Bilirubin: 1 mg/dL (ref 0.2–1.2)
Total Protein: 7.3 g/dL (ref 6.0–8.3)

## 2014-07-05 LAB — TSH: TSH: 0.7 u[IU]/mL (ref 0.35–4.50)

## 2014-07-05 MED ORDER — ESCITALOPRAM OXALATE 10 MG PO TABS: 10.0000 mg | ORAL_TABLET | Freq: Every day | ORAL | Status: DC

## 2014-07-05 NOTE — Progress Notes (Signed)
Pre visit review using our clinic review tool, if applicable. No additional management support is needed unless otherwise documented below in the visit note. 

## 2014-07-05 NOTE — Patient Instructions (Signed)
[employer], NOW supplement company, Curcumin is a derivative of Turmeric good for pain and inflammation Salon [place] gel or patches  [facility] for [place], [place], [address]  Cholesterol Cholesterol is a white, waxy, fat-like substance needed by your body in small amounts. The liver makes all the cholesterol you need. Cholesterol is carried from the liver by the blood through the blood vessels. Deposits of cholesterol (plaque) may build up on blood vessel walls. These make the arteries narrower and stiffer. Cholesterol plaques increase the risk for heart attack and stroke.  You cannot feel your cholesterol level even if it is very high. The only way to know it is high is with a blood test. Once you know your cholesterol levels, you should keep a record of the test results. Work with your health care provider to keep your levels in the desired range.  WHAT DO THE RESULTS MEAN?  Total cholesterol is a rough measure of all the cholesterol in your blood.   LDL is the so-called bad cholesterol. This is the type that deposits cholesterol in the walls of the arteries. You want this level to be low.   HDL is the good cholesterol because it cleans the arteries and carries the LDL away. You want this level to be high.  Triglycerides are fat that the body can either burn for energy or store. High levels are closely linked to heart disease.  WHAT ARE THE DESIRED LEVELS OF CHOLESTEROL?  Total cholesterol below 200.   LDL below 100 for people at risk, below 70 for those at very high risk.   HDL above 50 is good, above 60 is best.   Triglycerides below 150.  HOW CAN I LOWER MY CHOLESTEROL?  Diet. Follow your diet programs as directed by your health care provider.   Choose fish or white meat chicken and [place], roasted or baked. Limit fatty cuts of red meat, fried foods, and processed meats, such as sausage and lunch meats.   Eat lots of fresh fruits and  vegetables.  Choose whole grains, beans, pasta, potatoes, and cereals.   Use only small amounts of olive, corn, or canola oils.   Avoid butter, mayonnaise, shortening, or palm kernel oils.  Avoid foods with trans fats.   Drink skim or nonfat milk and eat low-fat or nonfat yogurt and cheeses. Avoid whole milk, cream, ice cream, egg yolks, and full-fat cheeses.   Healthy desserts include angel food cake, ginger snaps, animal crackers, hard candy, popsicles, and low-fat or nonfat frozen yogurt. Avoid pastries, cakes, pies, and cookies.   Exercise. Follow your exercise programs as directed by your health care provider.   A regular program helps decrease LDL and raise HDL.   A regular program helps with weight control.   Do things that increase your activity level like gardening, walking, or taking the stairs. Ask your health care provider about how you can be more active in your daily life.   Medicine. Take medicine only as directed by your health care provider.   Medicine may be prescribed by your health care provider to help lower cholesterol and decrease the risk for heart disease.   If you have several risk factors, you may need medicine even if your levels are normal. Document Released: 07/13/2001 Document Revised: 03/04/2014 Document Reviewed: 08/01/2013 [facility] Patient Information 2015 [place], [place]. This information is not intended to replace advice given to you by your health care provider. Make sure you discuss any questions you have with your  health care provider.  

## 2014-07-08 ENCOUNTER — Encounter: Payer: Self-pay | Admitting: Family Medicine

## 2014-07-08 NOTE — Progress Notes (Signed)
Patient ID: [name], female   DOB: Jun 11, 1971, 43 y.o.   MRN: [id number] [name] [id number] 10-21-1971 07/08/2014      Progress Note-Follow Up  Subjective  Chief Complaint  Chief Complaint  Patient presents with  . Follow-up  . Injections    flu    HPI  Patient is a 43 year old female in today for routine medical care. Continues to struggle with a diffiucult marriage but feels she is tolerating well. No recent illness. No palpitations. Feels she might be able to decrease her lexapro despite her ongoing stressors. Denies CP/palp/SOB/HA/congestion/fevers/GI or GU c/o. Taking meds as prescribed  Past Medical History  Diagnosis Date  . GERD (gastroesophageal reflux disease)   . IBS (irritable bowel syndrome)   . Interstitial cystitis   . Premature atrial contractions 2012  . Adult ADHD 2011    SYMPTOMS ONLY  . Vitamin D deficiency 07/02/2011  . Poor concentration 07/02/2011  . Multiple allergies 07/02/2011  . Anxiety 07/31/2011  . UTI (lower urinary tract infection) 08/28/2011  . Otitis media 03/16/2012  . Shoulder pain, left 04/11/2012  . Alopecia   . Anxiety and depression 07/31/2011  . Depression   . Perimenopausal 07/22/2013  . HSV-2 (herpes simplex virus 2) infection   . Chest pain 09/22/10    Sharp pains in left breast & sometimes in the right breast as well. Random in occurence & nonexertional. Also has occasional SOB & DOE when going up stairs & occasionally when working out. Nuclear Stress Treadmill 09/29/10 -   . SOB (shortness of breath) 09/22/10    ECHO 09/29/10 - NL LVF, trivial PE, trivial TR/PR.    Past Surgical History  Procedure Laterality Date  . Tonsillectomy  1982  . Rhinoplasty  2003  . Breast enhancement surgery  2007  . Cesarean section  2001 and 2004    x2  . Laparoscopy      twice  . Tubal ligation      Family History  Problem Relation Age of Onset  . Heart disease Mother     MVP  . Anxiety disorder Mother   . Mitral valve prolapse  Mother   . Hypertension Brother   . Obesity Brother   . Diabetes Brother     DM, obese  . Allergies Daughter   . Asthma Daughter   . Allergies Son   . Asthma Son   . Kidney disease Maternal Grandmother     uremia    History   Social History  . Marital Status: Married    Spouse Name: N/A    Number of Children: N/A  . Years of Education: N/A   Occupational History  . Not on file.   Social History Main Topics  . Smoking status: Never Smoker   . Smokeless tobacco: Never Used  . Alcohol Use: Yes     Comment: RARELY  . Drug Use: No  . Sexual Activity: Yes    Birth Control/ Protection: Surgical     Comment: Tubal lig   Other Topics Concern  . Not on file   Social History Narrative  . No narrative on file    Current Outpatient Prescriptions on File Prior to Visit  Medication Sig Dispense Refill  . amphetamine-dextroamphetamine (ADDERALL) 20 MG tablet Take 1 tablet (20 mg total) by mouth 2 (two) times daily. June 2015 rx  60 tablet  0  . aspirin 81 MG tablet Take 81 mg by mouth daily.        Marland Kitchen  AVAR CLEANSER 10-5 % EMUL       . Biotin 2500 MCG CAPS Take 1 capsule by mouth daily.        . Calcium Citrate-Vitamin D 250-200 MG-UNIT TABS Take 1 tablet by mouth daily.  30 each  11  . Cholecalciferol (VITAMIN D) 2000 UNITS CAPS Take 1 capsule (2,000 Units total) by mouth once.  30 capsule  11  . escitalopram (LEXAPRO) 20 MG tablet Take 1 tablet (20 mg total) by mouth daily.  30 tablet  2  . fexofenadine (ALLEGRA) 180 MG tablet Take 180 mg by mouth daily.        . finasteride (PROSCAR) 5 MG tablet Take 5 mg by mouth daily. Take 1/2 tab daily      . fish oil-omega-3 fatty acids 1000 MG capsule Take 2 g by mouth daily.       . mometasone (NASONEX) 50 MCG/ACT nasal spray Place 2 sprays into the nose daily.  17 g  5  . Multiple Vitamin (MULTIVITAMIN) capsule Take 1 capsule by mouth daily.        . nitrofurantoin (MACRODANTIN) 50 MG capsule As needed      . norethindrone-ethinyl  estradiol (MICROGESTIN,JUNEL,LOESTRIN) 1-20 MG-MCG tablet Take 1 tablet by mouth daily.  1 Package  11  . Polysaccharide Iron Complex (POLY-IRON 150 PO) Take 150 mg by mouth 2 (two) times daily.      Marland Kitchen senna (SENOKOT) 8.6 MG TABS Take 1 tablet by mouth daily.      Marland Kitchen spironolactone (ALDACTONE) 25 MG tablet Take 25 mg by mouth daily. Take 2 tabs daily      . tretinoin (RETIN-A) 0.025 % cream       . valACYclovir (VALTREX) 500 MG tablet Take 1 tablet (500 mg total) by mouth daily.  30 tablet  2  . zinc gluconate 50 MG tablet Take 50 mg by mouth daily.       No current facility-administered medications on file prior to visit.    No Known Allergies  Review of Systems  Review of Systems  Constitutional: Negative for fever and malaise/fatigue.  HENT: Negative for congestion.   Eyes: Negative for discharge.  Respiratory: Negative for shortness of breath.   Cardiovascular: Negative for chest pain, palpitations and leg swelling.  Gastrointestinal: Negative for nausea, abdominal pain and diarrhea.  Genitourinary: Negative for dysuria.  Musculoskeletal: Negative for falls.  Skin: Negative for rash.  Neurological: Negative for loss of consciousness and headaches.  Endo/Heme/Allergies: Negative for polydipsia.  Psychiatric/Behavioral: Positive for depression. Negative for suicidal ideas. The patient is nervous/anxious. The patient does not have insomnia.     Objective  BP 100/78  Pulse 66  Temp(Src) 98.4 F (36.9 C) (Oral)  Ht 5' 5.5" (1.664 m)  Wt 125 lb 6.4 oz (56.881 kg)  BMI 20.54 kg/m2  SpO2 97%  LMP 06/27/2014  Physical Exam  Physical Exam  Constitutional: She is oriented to person, place, and time and well-developed, well-nourished, and in no distress. No distress.  HENT:  Head: Normocephalic and atraumatic.  Eyes: Conjunctivae are normal.  Neck: Neck supple. No thyromegaly present.  Cardiovascular: Normal rate, regular rhythm and normal heart sounds.   No murmur  heard. Pulmonary/Chest: Effort normal and breath sounds normal. She has no wheezes.  Abdominal: She exhibits no distension and no mass.  Musculoskeletal: She exhibits no edema.  Lymphadenopathy:    She has no cervical adenopathy.  Neurological: She is alert and oriented to person, place, and time.  Skin:  Skin is warm and dry. No rash noted. She is not diaphoretic.  Psychiatric: Memory, affect and judgment normal.    Lab Results  Component Value Date   TSH 0.70 07/05/2014   Lab Results  Component Value Date   WBC 4.9 07/05/2014   HGB 13.9 07/05/2014   HCT 41.5 07/05/2014   MCV 89.3 07/05/2014   PLT 230.0 07/05/2014   Lab Results  Component Value Date   CREATININE 0.7 07/05/2014   BUN 18 07/05/2014   NA 137 07/05/2014   K 4.5 07/05/2014   CL 103 07/05/2014   CO2 27 07/05/2014   Lab Results  Component Value Date   ALT 18 07/05/2014   AST 20 07/05/2014   ALKPHOS 37* 07/05/2014   BILITOT 1.0 07/05/2014   Lab Results  Component Value Date   CHOL 230* 07/05/2014   Lab Results  Component Value Date   HDL 49.50 07/05/2014   Lab Results  Component Value Date   LDLCALC 165* 07/05/2014   Lab Results  Component Value Date   TRIG 77.0 07/05/2014   Lab Results  Component Value Date   CHOLHDL 5 07/05/2014     Assessment & Plan  Poor concentration Tolerating Adderall and uses it intermittently with good results  Vitamin D deficiency wnl vitamin d level on last check  Palpitations No c/o today  GERD (gastroesophageal reflux disease) Avoid offending foods, start probiotics. Do not eat large meals in late evening and consider raising head of bed.   Anxiety and depression Still struggling with a difficult marriage. Is managing on the Lexapro and handling it well. Is going to try a trial of lower dosing and see how she does on Lexapro 10 mg daily

## 2014-07-08 NOTE — Assessment & Plan Note (Signed)
Avoid offending foods, start probiotics. Do not eat large meals in late evening and consider raising head of bed.  

## 2014-07-08 NOTE — Assessment & Plan Note (Signed)
No c/o today 

## 2014-07-08 NOTE — Assessment & Plan Note (Signed)
wnl vitamin d level on last check

## 2014-07-08 NOTE — Assessment & Plan Note (Signed)
Still struggling with a difficult marriage. Is managing on the Lexapro and handling it well. Is going to try a trial of lower dosing and see how she does on Lexapro 10 mg daily

## 2014-07-08 NOTE — Assessment & Plan Note (Signed)
Tolerating Adderall and uses it intermittently with good results

## 2014-07-10 ENCOUNTER — Telehealth: Payer: Self-pay | Admitting: Family Medicine

## 2014-07-10 ENCOUNTER — Other Ambulatory Visit (HOSPITAL_COMMUNITY)
Admission: RE | Admit: 2014-07-10 | Discharge: 2014-07-10 | Disposition: A | Discharge status: Home / Self Care | Payer: BC Managed Care – PPO | Source: Ambulatory Visit | Attending: Gynecology | Admitting: Gynecology

## 2014-07-10 ENCOUNTER — Ambulatory Visit (INDEPENDENT_AMBULATORY_CARE_PROVIDER_SITE_OTHER): Payer: BC Managed Care – PPO | Admitting: Gynecology

## 2014-07-10 ENCOUNTER — Other Ambulatory Visit: Payer: Self-pay | Admitting: Family Medicine

## 2014-07-10 ENCOUNTER — Encounter: Payer: Self-pay | Admitting: Gynecology

## 2014-07-10 VITALS — BP 104/68 | Ht 65.25 in | Wt 126.0 lb

## 2014-07-10 DIAGNOSIS — Z1151 Encounter for screening for human papillomavirus (HPV): Secondary | ICD-10-CM | POA: Insufficient documentation

## 2014-07-10 DIAGNOSIS — E785 Hyperlipidemia, unspecified: Secondary | ICD-10-CM

## 2014-07-10 DIAGNOSIS — Z113 Encounter for screening for infections with a predominantly sexual mode of transmission: Secondary | ICD-10-CM

## 2014-07-10 DIAGNOSIS — Z01419 Encounter for gynecological examination (general) (routine) without abnormal findings: Secondary | ICD-10-CM | POA: Diagnosis present

## 2014-07-10 DIAGNOSIS — Z79890 Hormone replacement therapy (postmenopausal): Secondary | ICD-10-CM

## 2014-07-10 MED ORDER — VALACYCLOVIR HCL 500 MG PO TABS: 500.0000 mg | ORAL_TABLET | Freq: Every day | ORAL | Status: DC

## 2014-07-10 MED ORDER — NORETHINDRONE ACET-ETHINYL EST 1-20 MG-MCG PO TABS: 1.0000 | ORAL_TABLET | Freq: Every day | ORAL | Status: DC

## 2014-07-10 NOTE — Progress Notes (Signed)
[name] 07-27-1971 [id number]        43 y.o.  [id number] for annual exam.  Several issues noted below.  Past medical history,surgical history, problem list, medications, allergies, family history and social history were all reviewed and documented as reviewed in the EPIC chart.  ROS:  12 system ROS performed with pertinent positives and negatives included in the history, assessment and plan.   Additional significant findings :  none   Exam: Programmer, multimedia Vitals:   07/10/14 1216  BP: 104/68  Height: 5' 5.25" (1.657 m)  Weight: 126 lb (57.153 kg)   General appearance:  Normal affect, orientation and appearance. Skin: Grossly normal HEENT: Without gross lesions.  No cervical or supraclavicular adenopathy. Thyroid normal.  Lungs:  Clear without wheezing, rales or rhonchi Cardiac: RR, without RMG Abdominal:  Soft, nontender, without masses, guarding, rebound, organomegaly or hernia Breasts:  Examined lying and sitting without masses, retractions, discharge or axillary adenopathy.  Bilateral implants noted. Pelvic:  Ext/BUS/vagina normal  Cervix normal. Pap/HPV, GC/Chlamydia  Uterus anteverted, normal size, shape and contour, midline and mobile nontender   Adnexa  Without masses or tenderness    Anus and perineum  Normal   Rectovaginal  Normal sphincter tone without palpated masses or tenderness.    Assessment/Plan:  43 y.o. [id number] female for annual exam with regular menses on oral contraceptives with history of BTL.   1. HRT. Patient has undergone premature menopause with elevated FSH in the past. Is on Loestrin 120 as HRT and menstrual regulation. Doing well with this. Refill x1 year provided. Issues of lower dose HRT discussed with her but at this point we both agree on continuing with the low-dose oral contraceptive that she is otherwise healthy and never smoked. 2. Decreased libido. Patient having some decreased libido as well as difficulty achieving orgasm. Has just  decreased her Lexapro dose by her primary to see if this doesn't help. Reviewed input from birth control pills may be playing into this is for suppressing ovarian androgen. Options of switching to a lower dose HRT such as Activella being that she does not need the contraception portion also discussed. Alternatives to include testosterone cream reviewed but at this point rejected. Issues of absorption side effects include hair growth weight gain acne all reviewed. Will call me if this continues to be an issue. 3. STD screening. Patient requests STD screening. GC/Chlamydia, RPR, hepatitis B, hepatitis C, HIV ordered. 4. Pap smear 2012. Pap/HPV today.  No history of abnormal Pap smears previously. 5. Mammography 12/2013. History of small nodule left breast with ultrasound consistent with small cyst. Patient that she no longer can feel this area and her exam is normal today on physician exam. Plan monthly SBEs report any new findings otherwise continue with annual mammography. 6. Health maintenance. No routine blood work ordered as she just had this done at her primary physician's office. I did review the results which showed an elevated cholesterol and LDL and she's going to follow up with her other physician for management.   Note: This document was prepared with digital dictation and possible smart phrase technology. Any transcriptional errors that result from this process are unintentional.   Anastasio Auerbach MD, 12:44 PM 07/10/2014

## 2014-07-10 NOTE — Telephone Encounter (Signed)
Caller name:Iodice, Blanca Relation to pt: self  Call back [phone]  Reason for call: pt requesting a referral for a nutritionist due to the results of her blood work taken 07/05/14

## 2014-07-10 NOTE — Patient Instructions (Signed)
Call me if issues with decreased libido continue.  Office will call you with the blood test results  You may obtain a copy of any labs that were done today by logging onto MyChart as outlined in the instructions provided with your AVS (after visit summary). The office will not call with normal lab results but certainly if there are any significant abnormalities then we will contact you.   Health Maintenance, Female A healthy lifestyle and preventative care can promote health and wellness.  Maintain regular health, dental, and eye exams.  Eat a healthy diet. Foods like vegetables, fruits, whole grains, low-fat dairy products, and lean protein foods contain the nutrients you need without too many calories. Decrease your intake of foods high in solid fats, added sugars, and salt. Get information about a proper diet from your caregiver, if necessary.  Regular physical exercise is one of the most important things you can do for your health. Most adults should get at least 150 minutes of moderate-intensity exercise (any activity that increases your heart rate and causes you to sweat) each week. In addition, most adults need muscle-strengthening exercises on 2 or more days a week.   Maintain a healthy weight. The body mass index (BMI) is a screening tool to identify possible weight problems. It provides an estimate of body fat based on height and weight. Your caregiver can help determine your BMI, and can help you achieve or maintain a healthy weight. For adults 20 years and older:  A BMI below 18.5 is considered underweight.  A BMI of 18.5 to 24.9 is normal.  A BMI of 25 to 29.9 is considered overweight.  A BMI of 30 and above is considered obese.  Maintain normal blood lipids and cholesterol by exercising and minimizing your intake of saturated fat. Eat a balanced diet with plenty of fruits and vegetables. Blood tests for lipids and cholesterol should begin at age 56 and be repeated every 5  years. If your lipid or cholesterol levels are high, you are over 50, or you are a high risk for heart disease, you may need your cholesterol levels checked more frequently.Ongoing high lipid and cholesterol levels should be treated with medicines if diet and exercise are not effective.  If you smoke, find out from your caregiver how to quit. If you do not use tobacco, do not start.  Lung cancer screening is recommended for adults aged 45 80 years who are at high risk for developing lung cancer because of a history of smoking. Yearly low-dose computed tomography (CT) is recommended for people who have at least a 30-pack-year history of smoking and are a current smoker or have quit within the past 15 years. A pack year of smoking is smoking an average of 1 pack of cigarettes a day for 1 year (for example: 1 pack a day for 30 years or 2 packs a day for 15 years). Yearly screening should continue until the smoker has stopped smoking for at least 15 years. Yearly screening should also be stopped for people who develop a health problem that would prevent them from having lung cancer treatment.  If you are pregnant, do not drink alcohol. If you are breastfeeding, be very cautious about drinking alcohol. If you are not pregnant and choose to drink alcohol, do not exceed 1 drink per day. One drink is considered to be 12 ounces (355 mL) of beer, 5 ounces (148 mL) of wine, or 1.5 ounces (44 mL) of liquor.  Avoid use of  street drugs. Do not share needles with anyone. Ask for help if you need support or instructions about stopping the use of drugs.  High blood pressure causes heart disease and increases the risk of stroke. Blood pressure should be checked at least every 1 to 2 years. Ongoing high blood pressure should be treated with medicines, if weight loss and exercise are not effective.  If you are 55 to 45 years old, ask your caregiver if you should take aspirin to prevent strokes.  Diabetes screening  involves taking a blood sample to check your fasting blood sugar level. This should be done once every 3 years, after age 45, if you are within normal weight and without risk factors for diabetes. Testing should be considered at a younger age or be carried out more frequently if you are overweight and have at least 1 risk factor for diabetes.  Breast cancer screening is essential preventative care for women. You should practice "breast self-awareness." This means understanding the normal appearance and feel of your breasts and may include breast self-examination. Any changes detected, no matter how small, should be reported to a caregiver. Women in their 20s and 30s should have a clinical breast exam (CBE) by a caregiver as part of a regular health exam every 1 to 3 years. After age 40, women should have a CBE every year. Starting at age 40, women should consider having a mammogram (breast X-ray) every year. Women who have a family history of breast cancer should talk to their caregiver about genetic screening. Women at a high risk of breast cancer should talk to their caregiver about having an MRI and a mammogram every year.  Breast cancer gene (BRCA)-related cancer risk assessment is recommended for women who have family members with BRCA-related cancers. BRCA-related cancers include breast, ovarian, tubal, and peritoneal cancers. Having family members with these cancers may be associated with an increased risk for harmful changes (mutations) in the breast cancer genes BRCA1 and BRCA2. Results of the assessment will determine the need for genetic counseling and BRCA1 and BRCA2 testing.  The Pap test is a screening test for cervical cancer. Women should have a Pap test starting at age 21. Between ages 21 and 29, Pap tests should be repeated every 2 years. Beginning at age 30, you should have a Pap test every 3 years as long as the past 3 Pap tests have been normal. If you had a hysterectomy for a problem that  was not cancer or a condition that could lead to cancer, then you no longer need Pap tests. If you are between ages 65 and 70, and you have had normal Pap tests going back 10 years, you no longer need Pap tests. If you have had past treatment for cervical cancer or a condition that could lead to cancer, you need Pap tests and screening for cancer for at least 20 years after your treatment. If Pap tests have been discontinued, risk factors (such as a new sexual partner) need to be reassessed to determine if screening should be resumed. Some women have medical problems that increase the chance of getting cervical cancer. In these cases, your caregiver may recommend more frequent screening and Pap tests.  The human papillomavirus (HPV) test is an additional test that may be used for cervical cancer screening. The HPV test looks for the virus that can cause the cell changes on the cervix. The cells collected during the Pap test can be tested for HPV. The HPV test could   be used to screen women aged 67 years and older, and should be used in women of any age who have unclear Pap test results. After the age of 12, women should have HPV testing at the same frequency as a Pap test.  Colorectal cancer can be detected and often prevented. Most routine colorectal cancer screening begins at the age of 26 and continues through age 56. However, your caregiver may recommend screening at an earlier age if you have risk factors for colon cancer. On a yearly basis, your caregiver may provide home test kits to check for hidden blood in the stool. Use of a small camera at the end of a tube, to directly examine the colon (sigmoidoscopy or colonoscopy), can detect the earliest forms of colorectal cancer. Talk to your caregiver about this at age 6, when routine screening begins. Direct examination of the colon should be repeated every 5 to 10 years through age 67, unless early forms of pre-cancerous polyps or small growths are  found.  Hepatitis C blood testing is recommended for all people born from [birth year] through 1967 and any individual with known risks for hepatitis C.  Practice safe sex. Use condoms and avoid high-risk sexual practices to reduce the spread of sexually transmitted infections (STIs). Sexually active women aged 8 and younger should be checked for Chlamydia, which is a common sexually transmitted infection. Older women with new or multiple partners should also be tested for Chlamydia. Testing for other STIs is recommended if you are sexually active and at increased risk.  Osteoporosis is a disease in which the bones lose minerals and strength with aging. This can result in serious bone fractures. The risk of osteoporosis can be identified using a bone density scan. Women ages 69 and over and women at risk for fractures or osteoporosis should discuss screening with their caregivers. Ask your caregiver whether you should be taking a calcium supplement or vitamin D to reduce the rate of osteoporosis.  Menopause can be associated with physical symptoms and risks. Hormone replacement therapy is available to decrease symptoms and risks. You should talk to your caregiver about whether hormone replacement therapy is right for you.  Use sunscreen. Apply sunscreen liberally and repeatedly throughout the day. You should seek shade when your shadow is shorter than you. Protect yourself by wearing long sleeves, pants, a wide-brimmed hat, and sunglasses year round, whenever you are outdoors.  Notify your caregiver of new moles or changes in moles, especially if there is a change in shape or color. Also notify your caregiver if a mole is larger than the size of a pencil eraser.  Stay current with your immunizations. Document Released: 05/03/2011 Document Revised: 02/12/2013 Document Reviewed: 05/03/2011 [facility] Patient Information 2014 [place].

## 2014-07-11 LAB — URINALYSIS W MICROSCOPIC + REFLEX CULTURE
Bilirubin Urine: NEGATIVE
Casts: NONE SEEN
Crystals: NONE SEEN
Glucose, UA: NEGATIVE mg/dL
KETONES UR: NEGATIVE mg/dL
Leukocytes, UA: NEGATIVE
NITRITE: NEGATIVE
Protein, ur: NEGATIVE mg/dL
SPECIFIC GRAVITY, URINE: 1.029 (ref 1.005–1.030)
UROBILINOGEN UA: 0.2 mg/dL (ref 0.0–1.0)
pH: 6.5 (ref 5.0–8.0)

## 2014-07-11 LAB — HIV ANTIBODY (ROUTINE TESTING W REFLEX): HIV 1&2 Ab, 4th Generation: NONREACTIVE

## 2014-07-11 LAB — RPR

## 2014-07-11 LAB — GC/CHLAMYDIA PROBE AMP
CT PROBE, AMP APTIMA: NEGATIVE
GC Probe RNA: NEGATIVE

## 2014-07-11 LAB — HEPATITIS C ANTIBODY: HCV AB: NEGATIVE

## 2014-07-11 LAB — HEPATITIS B SURFACE ANTIGEN: HEP B S AG: NEGATIVE

## 2014-07-12 ENCOUNTER — Other Ambulatory Visit: Payer: Self-pay | Admitting: *Deleted

## 2014-07-12 DIAGNOSIS — N390 Urinary tract infection, site not specified: Secondary | ICD-10-CM

## 2014-07-12 MED ORDER — SULFAMETHOXAZOLE-TMP DS 800-160 MG PO TABS: 1.0000 | ORAL_TABLET | Freq: Two times a day (BID) | ORAL | Status: DC

## 2014-07-12 NOTE — Progress Notes (Signed)
Pt informed with the below note, rx sent, order placed for repeat u/a. Pt will call back to schdedule

## 2014-07-13 LAB — URINE CULTURE: Colony Count: 15000

## 2014-07-15 LAB — CYTOLOGY - PAP

## 2014-07-22 ENCOUNTER — Telehealth: Payer: Self-pay | Admitting: Family Medicine

## 2014-07-22 ENCOUNTER — Encounter: Payer: BC Managed Care – PPO | Attending: Family Medicine | Admitting: *Deleted

## 2014-07-22 ENCOUNTER — Encounter: Payer: Self-pay | Admitting: *Deleted

## 2014-07-22 VITALS — Ht 65.25 in | Wt 127.6 lb

## 2014-07-22 DIAGNOSIS — E785 Hyperlipidemia, unspecified: Secondary | ICD-10-CM | POA: Diagnosis not present

## 2014-07-22 NOTE — Telephone Encounter (Signed)
Caller name: Luddie, Boghosian Relation to pt: self  Call back number: [phone]   Reason for call:   Pt would like to discuss being weaned off escitalopram (LEXAPRO) 10 MG tablet

## 2014-07-22 NOTE — Telephone Encounter (Signed)
So she can be weaned off, take a 1/2 tab til gone then can call in the Lexapro 5 mg tab, can take a total of 5 mg a day for 60 days then drop to 1/2 tab every day for 60 more days then stop. If at any point she feels she needs to go back up she just has to let us know and we can increase again. Have her come in for follow up in next several months to see how this is going

## 2014-07-22 NOTE — Progress Notes (Signed)
Medical Nutrition Therapy:  Appt start time: 0800 end time:  0900.  Assessment:  Patient here today for hyperlipidemia. Patient with total cholesterol of 230 and LDL cholesterol of 165. She reports no history of hyperlipidemia. She is very active and is a normal weight. However, she does report a weight gain of 10 pounds since starting Lexapro. She is also experiencing early menopause, which may be contributing to elevated lipids. She generally tries to eat healthy, but doesn't have a consistent eating pattern in the afternoon, and snacks on sweets frequently.   MEDICATIONS: See list   DIETARY INTAKE:   Usual eating pattern includes 3 meals and >/=2 snacks per day.  24-hr recall:  B ( AM): None usually  Snk ( AM): None  L ( PM): Sandwich OR salad OR protein shake (almond milk, whey protein, raspberries) OR cookie Snk ( PM): Same as below D ( PM): Salmon/chicken/steak/pork chops, 2 vegetable (okra, squash, zucchini, sweet potato, corn), whole wheat pasta OR chicken pot pie (peas, carrot) Snk ( PM): Sweets, hummus with pita chips, fruit, grapes and cheese Beverages: Water, skim milk, chocolate milk, rarely alcohol, no soda  Usual physical activity: Exercise classes and resistance training 5 days a week at least 60 minutes  Estimated energy needs: 1800 calories 225 g carbohydrates 135 g protein 50 g fat  Progress Towards Goal(s):  In progress.   Nutritional Diagnosis:  NB-1.1 Food and nutrition-related knowledge deficit As related to heart healthy eating.  As evidenced by high intake of sweets.    Intervention:  Nutrition counseling. Patient educated on a heart healthy diet, including limiting total, saturated, and trans fats, and cholesterol. We also discussed eating a plant-based diet with increased fruits, vegetables, and whole grains.   Goals:  1. Limit fat intake to 50 g per day, limiting saturated fat and cholesterol, choosing healthier monounsaturated fats.  2. Eat 3 meals and  up to 3 snacks at regular meal times.  3. Choose healthy snacks (fruit, yogurt, vegetables, popcorn, small portions of nuts/cheese). 4. Limit sweet intake to 1 small portion 3-5 days weekly.   Handouts given during visit include:  Heart healthy nutrition therapy  Meal plan card  5 day sample meal plan  Monitoring/Evaluation:  Dietary intake, exercise, lipids, and body weight prn.

## 2014-07-23 MED ORDER — ESCITALOPRAM OXALATE 5 MG PO TABS: 5.0000 mg | ORAL_TABLET | Freq: Every day | ORAL | Status: DC

## 2014-07-23 NOTE — Telephone Encounter (Signed)
Pt informed and voiced understanding  Pt will call back to schedule appt  rx sent to pharmacy

## 2014-07-31 ENCOUNTER — Ambulatory Visit: Payer: BC Managed Care – PPO | Admitting: *Deleted

## 2014-09-02 ENCOUNTER — Encounter: Payer: Self-pay | Admitting: *Deleted

## 2014-09-11 ENCOUNTER — Other Ambulatory Visit: Payer: BC Managed Care – PPO

## 2014-09-11 ENCOUNTER — Telehealth: Payer: Self-pay | Admitting: *Deleted

## 2014-09-11 DIAGNOSIS — N390 Urinary tract infection, site not specified: Secondary | ICD-10-CM

## 2014-09-11 NOTE — Telephone Encounter (Signed)
Pt called requesting to switch BCP to the " next pill you recommend" pt said currently pills are not working with helping orgasm. Pt said you told her about a lower dose pill to try. Please advise

## 2014-09-11 NOTE — Telephone Encounter (Signed)
LoLoestrin 3 months with call back to let me know how she's doing

## 2014-09-12 LAB — URINALYSIS W MICROSCOPIC + REFLEX CULTURE
BILIRUBIN URINE: NEGATIVE
Bacteria, UA: NONE SEEN
Casts: NONE SEEN
Crystals: NONE SEEN
GLUCOSE, UA: NEGATIVE mg/dL
Hgb urine dipstick: NEGATIVE
Ketones, ur: NEGATIVE mg/dL
LEUKOCYTES UA: NEGATIVE
Nitrite: NEGATIVE
PROTEIN: NEGATIVE mg/dL
SPECIFIC GRAVITY, URINE: 1.025 (ref 1.005–1.030)
SQUAMOUS EPITHELIAL / LPF: NONE SEEN
UROBILINOGEN UA: 0.2 mg/dL (ref 0.0–1.0)
pH: 5 (ref 5.0–8.0)

## 2014-09-12 MED ORDER — NORETHIN-ETH ESTRAD-FE BIPHAS 1 MG-10 MCG / 10 MCG PO TABS: 1.0000 | ORAL_TABLET | Freq: Every day | ORAL | Status: DC

## 2014-09-12 NOTE — Telephone Encounter (Signed)
Pt informed with the below note, Rx sent. 

## 2014-09-17 ENCOUNTER — Telehealth: Payer: Self-pay | Admitting: *Deleted

## 2014-09-17 NOTE — Telephone Encounter (Signed)
We could try hormone replacement therapy instead of the birth control pill such as Activella 1/0.5. Could also do the generic estradiol 1 mg and Prometrium 100 mg at bedtime. Activella would be easier but may be more expensive. Could check with her plan to see.

## 2014-09-17 NOTE — Telephone Encounter (Signed)
Pt informed with the below note, pt declined to take HRT and will continue with BCP Rx.

## 2014-09-17 NOTE — Telephone Encounter (Signed)
Pt calling to follow up from telephone encounter 09/11/14 states lo Loestrin is $50 per pack. Pt said if you think this is the best option for her to help with "issues with orgasm" she is willing to pay. But if there are other options she is open to those as well. Please advise

## 2014-10-04 ENCOUNTER — Ambulatory Visit (INDEPENDENT_AMBULATORY_CARE_PROVIDER_SITE_OTHER): Payer: BC Managed Care – PPO | Admitting: Family Medicine

## 2014-10-04 ENCOUNTER — Encounter: Payer: Self-pay | Admitting: Family Medicine

## 2014-10-04 VITALS — BP 96/38 | HR 79 | Temp 97.8°F | Ht 65.5 in | Wt 126.0 lb

## 2014-10-04 DIAGNOSIS — R41840 Attention and concentration deficit: Secondary | ICD-10-CM

## 2014-10-04 DIAGNOSIS — F32A Depression, unspecified: Secondary | ICD-10-CM

## 2014-10-04 DIAGNOSIS — N3010 Interstitial cystitis (chronic) without hematuria: Secondary | ICD-10-CM

## 2014-10-04 DIAGNOSIS — R300 Dysuria: Secondary | ICD-10-CM

## 2014-10-04 DIAGNOSIS — F419 Anxiety disorder, unspecified: Secondary | ICD-10-CM

## 2014-10-04 DIAGNOSIS — F329 Major depressive disorder, single episode, unspecified: Secondary | ICD-10-CM

## 2014-10-04 DIAGNOSIS — F418 Other specified anxiety disorders: Secondary | ICD-10-CM

## 2014-10-04 MED ORDER — SULFAMETHOXAZOLE-TRIMETHOPRIM 800-160 MG PO TABS: 1.0000 | ORAL_TABLET | Freq: Two times a day (BID) | ORAL | Status: DC

## 2014-10-04 NOTE — Patient Instructions (Addendum)
[email or web address]   Urinary Tract Infection Urinary tract infections (UTIs) can develop anywhere along your urinary tract. Your urinary tract is your body's drainage system for removing wastes and extra water. Your urinary tract includes two kidneys, two ureters, a bladder, and a urethra. Your kidneys are a pair of bean-shaped organs. Each kidney is about the size of your fist. They are located below your ribs, one on each side of your spine. CAUSES Infections are caused by microbes, which are microscopic organisms, including fungi, viruses, and bacteria. These organisms are so small that they can only be seen through a microscope. Bacteria are the microbes that most commonly cause UTIs. SYMPTOMS  Symptoms of UTIs may vary by age and gender of the patient and by the location of the infection. Symptoms in young women typically include a frequent and intense urge to urinate and a painful, burning feeling in the bladder or urethra during urination. Older women and men are more likely to be tired, shaky, and weak and have muscle aches and abdominal pain. A fever may mean the infection is in your kidneys. Other symptoms of a kidney infection include pain in your back or sides below the ribs, nausea, and vomiting. DIAGNOSIS To diagnose a UTI, your caregiver will ask you about your symptoms. Your caregiver also will ask to provide a urine sample. The urine sample will be tested for bacteria and white blood cells. White blood cells are made by your body to help fight infection. TREATMENT  Typically, UTIs can be treated with medication. Because most UTIs are caused by a bacterial infection, they usually can be treated with the use of antibiotics. The choice of antibiotic and length of treatment depend on your symptoms and the type of bacteria causing your infection. HOME CARE INSTRUCTIONS  If you were prescribed antibiotics, take them exactly as your caregiver instructs you. Finish the medication even if  you feel better after you have only taken some of the medication.  Drink enough water and fluids to keep your urine clear or pale yellow.  Avoid caffeine, tea, and carbonated beverages. They tend to irritate your bladder.  Empty your bladder often. Avoid holding urine for long periods of time.  Empty your bladder before and after sexual intercourse.  After a bowel movement, women should cleanse from front to back. Use each tissue only once. SEEK MEDICAL CARE IF:   You have back pain.  You develop a fever.  Your symptoms do not begin to resolve within 3 days. SEEK IMMEDIATE MEDICAL CARE IF:   You have severe back pain or lower abdominal pain.  You develop chills.  You have nausea or vomiting.  You have continued burning or discomfort with urination. MAKE SURE YOU:   Understand these instructions.  Will watch your condition.  Will get help right away if you are not doing well or get worse. Document Released: 07/28/2005 Document Revised: 04/18/2012 Document Reviewed: 11/26/2011 [facility] Patient Information 2015 [place], [place]. This information is not intended to replace advice given to you by your health care provider. Make sure you discuss any questions you have with your health care provider.

## 2014-10-04 NOTE — Progress Notes (Signed)
Pre visit review using our clinic review tool, if applicable. No additional management support is needed unless otherwise documented below in the visit note. 

## 2014-10-06 LAB — URINE CULTURE: Colony Count: 75000

## 2014-10-13 ENCOUNTER — Encounter: Payer: Self-pay | Admitting: Family Medicine

## 2014-10-13 NOTE — Assessment & Plan Note (Addendum)
Urine sample unremarkable. Encouraged probiotics, zinc and adequate hydration

## 2014-10-13 NOTE — Progress Notes (Signed)
[name]  [id number] 08-09-1971 10/13/2014      Progress Note-Follow Up  Subjective  Chief Complaint  Chief Complaint  Patient presents with  . Follow-up    3 month    HPI  Patient is a 43 y.o. female in today for routine medical care. In today for follow-up and doing fairly well. No recent illness. She continues to slowly titrate down her Lexapro with good results. She denies any significant increase in anxiety. she's had no recent illness. Denies CP/palp/SOB/HA/congestion/fevers/GI or GU c/o. Taking meds as prescribed  Past Medical History  Diagnosis Date  . GERD (gastroesophageal reflux disease)   . IBS (irritable bowel syndrome)   . Interstitial cystitis   . Premature atrial contractions 2012  . Adult ADHD 2011    SYMPTOMS ONLY  . Vitamin D deficiency 07/02/2011  . Poor concentration 07/02/2011  . Multiple allergies 07/02/2011  . Anxiety 07/31/2011  . UTI (lower urinary tract infection) 08/28/2011  . Otitis media 03/16/2012  . Shoulder pain, left 04/11/2012  . Alopecia   . Anxiety and depression 07/31/2011  . Depression   . Perimenopausal 07/22/2013  . HSV-2 (herpes simplex virus 2) infection   . Chest pain 09/22/10    Sharp pains in left breast & sometimes in the right breast as well. Random in occurence & nonexertional. Also has occasional SOB & DOE when going up stairs & occasionally when working out. Nuclear Stress Treadmill 09/29/10 -   . SOB (shortness of breath) 09/22/10    ECHO 09/29/10 - NL LVF, trivial PE, trivial TR/PR.    Past Surgical History  Procedure Laterality Date  . Tonsillectomy  1982  . Rhinoplasty  2003  . Breast enhancement surgery  2007  . Cesarean section  2001 and 2004    x2  . Laparoscopy      twice  . Tubal ligation      Family History  Problem Relation Age of Onset  . Heart disease Mother     MVP  . Anxiety disorder Mother   . Mitral valve prolapse Mother   . Hypertension Brother   . Obesity Brother   . Diabetes Brother       DM, obese  . Allergies Daughter   . Asthma Daughter   . Allergies Son   . Asthma Son   . Kidney disease Maternal Grandmother     uremia    History   Social History  . Marital Status: Married    Spouse Name: N/A    Number of Children: N/A  . Years of Education: N/A   Occupational History  . Not on file.   Social History Main Topics  . Smoking status: Never Smoker   . Smokeless tobacco: Never Used  . Alcohol Use: Yes     Comment: RARELY  . Drug Use: No  . Sexual Activity: Yes    Birth Control/ Protection: Surgical     Comment: Tubal lig   Other Topics Concern  . Not on file   Social History Narrative    Current Outpatient Prescriptions on File Prior to Visit  Medication Sig Dispense Refill  . amphetamine-dextroamphetamine (ADDERALL) 20 MG tablet Take 1 tablet (20 mg total) by mouth 2 (two) times daily. June 2015 rx 60 tablet 0  . aspirin 81 MG tablet Take 81 mg by mouth daily.      Marland Kitchen AVAR CLEANSER 10-5 % EMUL     . Biotin 2500 MCG CAPS Take 1 capsule by mouth daily.      Marland Kitchen  Calcium Citrate-Vitamin D 250-200 MG-UNIT TABS Take 1 tablet by mouth daily. 30 each 11  . Cholecalciferol (VITAMIN D) 2000 UNITS CAPS Take 1 capsule (2,000 Units total) by mouth once. 30 capsule 11  . escitalopram (LEXAPRO) 5 MG tablet Take 1 tablet (5 mg total) by mouth daily. 30 tablet 3  . fexofenadine (ALLEGRA) 180 MG tablet Take 180 mg by mouth daily.      . finasteride (PROSCAR) 5 MG tablet Take 5 mg by mouth daily. Take 1/2 tab daily    . fish oil-omega-3 fatty acids 1000 MG capsule Take 2 g by mouth daily.     . mometasone (NASONEX) 50 MCG/ACT nasal spray Place 2 sprays into the nose daily. 17 g 5  . Multiple Vitamin (MULTIVITAMIN) capsule Take 1 capsule by mouth daily.      . nitrofurantoin (MACRODANTIN) 50 MG capsule As needed    . norethindrone-ethinyl estradiol (MICROGESTIN,JUNEL,LOESTRIN) 1-20 MG-MCG tablet Take 1 tablet by mouth daily. 1 Package 11  . Norethindrone-Ethinyl  Estradiol-Fe Biphas (LO LOESTRIN FE) 1 MG-10 MCG / 10 MCG tablet Take 1 tablet by mouth daily. 3 Package 0  . Polysaccharide Iron Complex (POLY-IRON 150 PO) Take 150 mg by mouth 2 (two) times daily.    Marland Kitchen senna (SENOKOT) 8.6 MG TABS Take 1 tablet by mouth daily.    Marland Kitchen spironolactone (ALDACTONE) 25 MG tablet Take 25 mg by mouth daily. Take 2 tabs daily    . tretinoin (RETIN-A) 0.025 % cream     . valACYclovir (VALTREX) 500 MG tablet Take 1 tablet (500 mg total) by mouth daily. 30 tablet 11  . zinc gluconate 50 MG tablet Take 50 mg by mouth daily.     No current facility-administered medications on file prior to visit.    No Known Allergies  Review of Systems  Review of Systems  Constitutional: Negative for fever and malaise/fatigue.  HENT: Negative for congestion.   Eyes: Negative for discharge.  Respiratory: Negative for shortness of breath.   Cardiovascular: Negative for chest pain, palpitations and leg swelling.  Gastrointestinal: Negative for nausea, abdominal pain and diarrhea.  Genitourinary: Positive for dysuria and frequency. Negative for hematuria and flank pain.  Musculoskeletal: Negative for falls.  Skin: Negative for rash.  Neurological: Negative for loss of consciousness and headaches.  Endo/Heme/Allergies: Negative for polydipsia.  Psychiatric/Behavioral: Negative for depression and suicidal ideas. The patient is not nervous/anxious and does not have insomnia.     Objective  BP 96/38 mmHg  Pulse 79  Temp(Src) 97.8 F (36.6 C) (Oral)  Ht 5' 5.5" (1.664 m)  Wt 126 lb (57.153 kg)  BMI 20.64 kg/m2  SpO2 100%  LMP 10/01/2014  Physical Exam  Physical Exam  Constitutional: She is oriented to person, place, and time and well-developed, well-nourished, and in no distress. No distress.  HENT:  Head: Normocephalic and atraumatic.  Eyes: Conjunctivae are normal.  Neck: Neck supple. No thyromegaly present.  Cardiovascular: Normal rate, regular rhythm and normal heart  sounds.   No murmur heard. Pulmonary/Chest: Effort normal and breath sounds normal. She has no wheezes.  Abdominal: She exhibits no distension and no mass.  Musculoskeletal: She exhibits no edema.  Lymphadenopathy:    She has no cervical adenopathy.  Neurological: She is alert and oriented to person, place, and time.  Skin: Skin is warm and dry. No rash noted. She is not diaphoretic.  Psychiatric: Memory, affect and judgment normal.    Lab Results  Component Value Date   TSH 0.70  07/05/2014   Lab Results  Component Value Date   WBC 4.9 07/05/2014   HGB 13.9 07/05/2014   HCT 41.5 07/05/2014   MCV 89.3 07/05/2014   PLT 230.0 07/05/2014   Lab Results  Component Value Date   CREATININE 0.7 07/05/2014   BUN 18 07/05/2014   NA 137 07/05/2014   K 4.5 07/05/2014   CL 103 07/05/2014   CO2 27 07/05/2014   Lab Results  Component Value Date   ALT 18 07/05/2014   AST 20 07/05/2014   ALKPHOS 37* 07/05/2014   BILITOT 1.0 07/05/2014   Lab Results  Component Value Date   CHOL 230* 07/05/2014   Lab Results  Component Value Date   HDL 49.50 07/05/2014   Lab Results  Component Value Date   LDLCALC 165* 07/05/2014   Lab Results  Component Value Date   TRIG 77.0 07/05/2014   Lab Results  Component Value Date   CHOLHDL 5 07/05/2014     Assessment & Plan  Interstitial cystitis Urine sample unremarkable. Encouraged probiotics, zinc and adequate hydration  Anxiety and depression Doing well as she titrates down the Lexapro, may continue the same for now. Report any concerns. Continues to work with a counselor  Poor concentration Doing well with exercise and current dose of medications. Continue the same

## 2014-10-13 NOTE — Assessment & Plan Note (Signed)
Doing well as she titrates down the Lexapro, may continue the same for now. Report any concerns. Continues to work with a Social worker

## 2014-10-13 NOTE — Assessment & Plan Note (Signed)
Doing well with exercise and current dose of medications. Continue the same

## 2014-11-18 ENCOUNTER — Ambulatory Visit: Payer: Self-pay | Admitting: Family Medicine

## 2014-11-19 ENCOUNTER — Ambulatory Visit (HOSPITAL_BASED_OUTPATIENT_CLINIC_OR_DEPARTMENT_OTHER)
Admission: RE | Admit: 2014-11-19 | Discharge: 2014-11-19 | Disposition: A | Discharge status: Home / Self Care | Payer: BLUE CROSS/BLUE SHIELD | Source: Ambulatory Visit | Attending: Family Medicine | Admitting: Family Medicine

## 2014-11-19 ENCOUNTER — Ambulatory Visit (INDEPENDENT_AMBULATORY_CARE_PROVIDER_SITE_OTHER): Payer: BLUE CROSS/BLUE SHIELD | Admitting: Family Medicine

## 2014-11-19 ENCOUNTER — Encounter: Payer: Self-pay | Admitting: Family Medicine

## 2014-11-19 VITALS — BP 93/52 | HR 72 | Temp 98.3°F | Wt 127.0 lb

## 2014-11-19 DIAGNOSIS — M47892 Other spondylosis, cervical region: Secondary | ICD-10-CM | POA: Diagnosis not present

## 2014-11-19 DIAGNOSIS — M62838 Other muscle spasm: Secondary | ICD-10-CM

## 2014-11-19 DIAGNOSIS — M542 Cervicalgia: Secondary | ICD-10-CM | POA: Diagnosis present

## 2014-11-19 DIAGNOSIS — M79601 Pain in right arm: Secondary | ICD-10-CM | POA: Insufficient documentation

## 2014-11-19 MED ORDER — CYCLOBENZAPRINE HCL 10 MG PO TABS: 10.0000 mg | ORAL_TABLET | Freq: Three times a day (TID) | ORAL | Status: DC | PRN

## 2014-11-19 NOTE — Patient Instructions (Signed)

## 2014-11-19 NOTE — Progress Notes (Signed)
Pre visit review using our clinic review tool, if applicable. No additional management support is needed unless otherwise documented below in the visit note. 

## 2014-11-19 NOTE — Progress Notes (Signed)
   Subjective:    Patient ID: [name], female    DOB: [birth year], 44 y.o.   MRN: [id number]  HPI  Pt here c/o R arm pain x several months that worsens when she bends her neck.  Pt states it is not numbness or tingling but pain-- no known injury. No cp or sob.    Review of Systems  Musculoskeletal: Positive for myalgias and neck pain. Negative for joint swelling and neck stiffness.       + pain in R arm that extends and worsens when she bends her neck  Neurological: Negative for dizziness, tremors, seizures, syncope, facial asymmetry, speech difficulty, weakness, light-headedness, numbness and headaches.       Objective:   Physical Exam BP 93/52 mmHg  Pulse 72  Temp(Src) 98.3 F (36.8 C) (Oral)  Wt 127 lb (57.607 kg)  SpO2 97%  LMP 10/19/2014 General appearance: alert, cooperative, appears stated age and no distress Neck: no adenopathy, supple, symmetrical, trachea midline and thyroid not enlarged, symmetric, no tenderness/mass/nodules Extremities: extremities normal, atraumatic, no cyanosis or edema Neurologic: Alert and oriented X 3, normal strength and tone. Normal symmetric reflexes. Normal coordination and gait        Assessment & Plan:  1. Right arm pain Muscle spasm vs pinched nerve - DG Cervical Spine 2 or 3 views; Future  2. Muscle spasm Warm moist compresses stretching - cyclobenzaprine (FLEXERIL) 10 MG tablet; Take 1 tablet (10 mg total) by mouth 3 (three) times daily as needed for muscle spasms.  Dispense: 30 tablet; Refill: 0

## 2014-11-20 ENCOUNTER — Telehealth: Payer: Self-pay | Admitting: Family Medicine

## 2014-11-20 NOTE — Telephone Encounter (Signed)
Caller name: blanca Relation to pt: self Call back number: [phone] Pharmacy:  Reason for call:   Patient would like to discuss last Xray results.

## 2014-11-20 NOTE — Telephone Encounter (Signed)
Caller name: Lan, [place] Relation to pt: self  Call back number: [phone]   Reason for call:  Pt inquiring about x-ray results that Dr. Etter Sjogren ordered.

## 2014-11-20 NOTE — Telephone Encounter (Signed)
Patient notified that x-ray showed arthritic changes.  Patient given number for radiology to call and have films made.  Patient very unsatisfied stating that she was only given one month of pain medication and she does not understand what to do after this.  Patient scheduled for an appointment with her chiropractor tomorrow.  Advised patient to bring films and discuss at this appointment.  Educated patient on arthritis and possible causes.  Patient remains agitated but states that she will speak with her chiropractor tomorrow. eal

## 2014-11-21 NOTE — Telephone Encounter (Signed)
Spoke with patient.  She has several questions regarding what x-ray results mean for her current lifestyle.  Such as can she still exercise, are there certain exercises patient should avoid, should her chiropractor take over care at this point, what medications should she take, etc.  Pt was advised to keep appointment scheduled with Dr. Charlett Blake on 12/02/14 at 11:15 am.

## 2014-11-25 ENCOUNTER — Ambulatory Visit: Payer: BLUE CROSS/BLUE SHIELD | Admitting: Family

## 2014-12-02 ENCOUNTER — Encounter: Payer: Self-pay | Admitting: Family Medicine

## 2014-12-02 ENCOUNTER — Ambulatory Visit (INDEPENDENT_AMBULATORY_CARE_PROVIDER_SITE_OTHER): Payer: BLUE CROSS/BLUE SHIELD | Admitting: Family Medicine

## 2014-12-02 VITALS — BP 105/68 | HR 73 | Temp 98.2°F | Ht 65.5 in | Wt 125.0 lb

## 2014-12-02 DIAGNOSIS — M62838 Other muscle spasm: Secondary | ICD-10-CM

## 2014-12-02 DIAGNOSIS — F419 Anxiety disorder, unspecified: Secondary | ICD-10-CM

## 2014-12-02 DIAGNOSIS — M542 Cervicalgia: Secondary | ICD-10-CM | POA: Insufficient documentation

## 2014-12-02 DIAGNOSIS — K219 Gastro-esophageal reflux disease without esophagitis: Secondary | ICD-10-CM

## 2014-12-02 DIAGNOSIS — F418 Other specified anxiety disorders: Secondary | ICD-10-CM

## 2014-12-02 DIAGNOSIS — F329 Major depressive disorder, single episode, unspecified: Secondary | ICD-10-CM

## 2014-12-02 MED ORDER — CYCLOBENZAPRINE HCL 10 MG PO TABS: 10.0000 mg | ORAL_TABLET | Freq: Three times a day (TID) | ORAL | Status: DC | PRN

## 2014-12-02 NOTE — Patient Instructions (Signed)
Back Pain, Adult Low back pain is very common. About 1 in 5 people have back pain.The cause of low back pain is rarely dangerous. The pain often gets better over time.About half of people with a sudden onset of back pain feel better in just 2 weeks. About 8 in 10 people feel better by 6 weeks.  CAUSES Some common causes of back pain include:  Strain of the muscles or ligaments supporting the spine.  Wear and tear (degeneration) of the spinal discs.  Arthritis.  Direct injury to the back. DIAGNOSIS Most of the time, the direct cause of low back pain is not known.However, back pain can be treated effectively even when the exact cause of the pain is unknown.Answering your caregiver's questions about your overall health and symptoms is one of the most accurate ways to make sure the cause of your pain is not dangerous. If your caregiver needs more information, he or she may order lab work or imaging tests (X-rays or MRIs).However, even if imaging tests show changes in your back, this usually does not require surgery. HOME CARE INSTRUCTIONS For many people, back pain returns.Since low back pain is rarely dangerous, it is often a condition that people can learn to manageon their own.   Remain active. It is stressful on the back to sit or stand in one place. Do not sit, drive, or stand in one place for more than 30 minutes at a time. Take short walks on level surfaces as soon as pain allows.Try to increase the length of time you walk each day.  Do not stay in bed.Resting more than 1 or 2 days can delay your recovery.  Do not avoid exercise or work.Your body is made to move.It is not dangerous to be active, even though your back may hurt.Your back will likely heal faster if you return to being active before your pain is gone.  Pay attention to your body when you bend and lift. Many people have less discomfortwhen lifting if they bend their knees, keep the load close to their bodies,and  avoid twisting. Often, the most comfortable positions are those that put less stress on your recovering back.  Find a comfortable position to sleep. Use a firm mattress and lie on your side with your knees slightly bent. If you lie on your back, put a pillow under your knees.  Only take over-the-counter or prescription medicines as directed by your caregiver. Over-the-counter medicines to reduce pain and inflammation are often the most helpful.Your caregiver may prescribe muscle relaxant drugs.These medicines help dull your pain so you can more quickly return to your normal activities and healthy exercise.  Put ice on the injured area.  Put ice in a plastic bag.  Place a towel between your skin and the bag.  Leave the ice on for 15-20 minutes, 03-04 times a day for the first 2 to 3 days. After that, ice and heat may be alternated to reduce pain and spasms.  Ask your caregiver about trying back exercises and gentle massage. This may be of some benefit.  Avoid feeling anxious or stressed.Stress increases muscle tension and can worsen back pain.It is important to recognize when you are anxious or stressed and learn ways to manage it.Exercise is a great option. SEEK MEDICAL CARE IF:  You have pain that is not relieved with rest or medicine.  You have pain that does not improve in 1 week.  You have new symptoms.  You are generally not feeling well. SEEK   IMMEDIATE MEDICAL CARE IF:   You have pain that radiates from your back into your legs.  You develop new bowel or bladder control problems.  You have unusual weakness or numbness in your arms or legs.  You develop nausea or vomiting.  You develop abdominal pain.  You feel faint. Document Released: 10/18/2005 Document Revised: 04/18/2012 Document Reviewed: 02/19/2014 [employer] Patient Information 2015 [employer]. This information is not intended to replace advice given to you by your health care provider. Make sure you  discuss any questions you have with your health care provider.  

## 2014-12-02 NOTE — Assessment & Plan Note (Signed)
Has been taking 3 Ibuprofen and is encouraged to drop to 2 tabs with food

## 2014-12-02 NOTE — Assessment & Plan Note (Signed)
Doing well on 1/2 of a Lexapro every other day continue to wean off

## 2014-12-02 NOTE — Assessment & Plan Note (Signed)
With radicular symptoms at times down as far as her wrist, no acute injury. Discussed xray results with patient and options. For now she will continue Ibuprofen and Flexeril prn, she will continue care with chiropractic and she is referred for PT if symptoms worsen will need further imaging and referral.

## 2014-12-02 NOTE — Progress Notes (Signed)
[name]  [id number] 1971/08/28 12/02/2014      Progress Note-Follow Up  Subjective  Chief Complaint  Chief Complaint  Patient presents with  . Advice Only    discuss recent x-ray results    HPI  Patient is a 44 y.o. female in today for routine medical care. Patient is in today to discuss x-ray results. She continues to struggle with some intermittent neck pain and right upper extremity radicular pain. The pain stops at the wrist and is not there constantly. She had some x-rays which did show degenerative changes with some foramen narrowing so she is here to discuss. She continues with chiropractic which is helpful. She has found Flexeril somewhat helpful as is ibuprofen. She had no recent falls or trauma. She has no new or worsening symptoms to report. Denies CP/palp/SOB/HA/congestion/fevers/GI or GU c/o. Taking meds as prescribed. She continues to wean down on her Lexapro with good results, she believes that she is tolerating the wean well. No increase in anxiety or depression.  Past Medical History  Diagnosis Date  . GERD (gastroesophageal reflux disease)   . IBS (irritable bowel syndrome)   . Interstitial cystitis   . Premature atrial contractions 2012  . Adult ADHD 2011    SYMPTOMS ONLY  . Vitamin D deficiency 07/02/2011  . Poor concentration 07/02/2011  . Multiple allergies 07/02/2011  . Anxiety 07/31/2011  . UTI (lower urinary tract infection) 08/28/2011  . Otitis media 03/16/2012  . Shoulder pain, left 04/11/2012  . Alopecia   . Anxiety and depression 07/31/2011  . Depression   . Perimenopausal 07/22/2013  . HSV-2 (herpes simplex virus 2) infection   . Chest pain 09/22/10    Sharp pains in left breast & sometimes in the right breast as well. Random in occurence & nonexertional. Also has occasional SOB & DOE when going up stairs & occasionally when working out. Nuclear Stress Treadmill 09/29/10 -   . SOB (shortness of breath) 09/22/10    ECHO 09/29/10 - NL LVF, trivial  PE, trivial TR/PR.    Past Surgical History  Procedure Laterality Date  . Tonsillectomy  1982  . Rhinoplasty  2003  . Breast enhancement surgery  2007  . Cesarean section  2001 and 2004    x2  . Laparoscopy      twice  . Tubal ligation      Family History  Problem Relation Age of Onset  . Heart disease Mother     MVP  . Anxiety disorder Mother   . Mitral valve prolapse Mother   . Hypertension Brother   . Obesity Brother   . Diabetes Brother     DM, obese  . Allergies Daughter   . Asthma Daughter   . Allergies Son   . Asthma Son   . Kidney disease Maternal Grandmother     uremia    History   Social History  . Marital Status: Married    Spouse Name: N/A    Number of Children: N/A  . Years of Education: N/A   Occupational History  . Not on file.   Social History Main Topics  . Smoking status: Never Smoker   . Smokeless tobacco: Never Used  . Alcohol Use: Yes     Comment: RARELY  . Drug Use: No  . Sexual Activity: Yes    Birth Control/ Protection: Surgical     Comment: Tubal lig   Other Topics Concern  . Not on file   Social History Narrative  Current Outpatient Prescriptions on File Prior to Visit  Medication Sig Dispense Refill  . amphetamine-dextroamphetamine (ADDERALL) 20 MG tablet Take 1 tablet (20 mg total) by mouth 2 (two) times daily. June 2015 rx 60 tablet 0  . aspirin 81 MG tablet Take 81 mg by mouth daily.      Marland Kitchen AVAR CLEANSER 10-5 % EMUL     . Biotin 2500 MCG CAPS Take 1 capsule by mouth daily.      . Calcium Citrate-Vitamin D 250-200 MG-UNIT TABS Take 1 tablet by mouth daily. 30 each 11  . Cholecalciferol (VITAMIN D) 2000 UNITS CAPS Take 1 capsule (2,000 Units total) by mouth once. 30 capsule 11  . cyclobenzaprine (FLEXERIL) 10 MG tablet Take 1 tablet (10 mg total) by mouth 3 (three) times daily as needed for muscle spasms. 30 tablet 0  . escitalopram (LEXAPRO) 5 MG tablet Take 1 tablet (5 mg total) by mouth daily. (Patient taking  differently: Take 2.5 mg by mouth daily. ) 30 tablet 3  . fexofenadine (ALLEGRA) 180 MG tablet Take 180 mg by mouth daily.      . finasteride (PROSCAR) 5 MG tablet Take 5 mg by mouth daily. Take 1/2 tab daily    . fish oil-omega-3 fatty acids 1000 MG capsule Take 2 g by mouth daily.     . mometasone (NASONEX) 50 MCG/ACT nasal spray Place 2 sprays into the nose daily. 17 g 5  . Multiple Vitamin (MULTIVITAMIN) capsule Take 1 capsule by mouth daily.      . nitrofurantoin (MACRODANTIN) 50 MG capsule As needed    . Norethindrone-Ethinyl Estradiol-Fe Biphas (LO LOESTRIN FE) 1 MG-10 MCG / 10 MCG tablet Take 1 tablet by mouth daily. 3 Package 0  . Polysaccharide Iron Complex (POLY-IRON 150 PO) Take 150 mg by mouth 2 (two) times daily.    Marland Kitchen senna (SENOKOT) 8.6 MG TABS Take 1 tablet by mouth daily.    Marland Kitchen spironolactone (ALDACTONE) 25 MG tablet Take 25 mg by mouth daily. Take 2 tabs daily    . tretinoin (RETIN-A) 0.025 % cream     . valACYclovir (VALTREX) 500 MG tablet Take 1 tablet (500 mg total) by mouth daily. 30 tablet 11  . zinc gluconate 50 MG tablet Take 50 mg by mouth daily.     No current facility-administered medications on file prior to visit.    No Known Allergies  Review of Systems  Review of Systems  Constitutional: Negative for fever and malaise/fatigue.  HENT: Negative for congestion.   Eyes: Negative for discharge.  Respiratory: Negative for shortness of breath.   Cardiovascular: Negative for chest pain, palpitations and leg swelling.  Gastrointestinal: Negative for nausea, abdominal pain and diarrhea.  Genitourinary: Negative for dysuria.  Musculoskeletal: Positive for joint pain and neck pain. Negative for falls.  Skin: Negative for rash.  Neurological: Negative for loss of consciousness and headaches.  Endo/Heme/Allergies: Negative for polydipsia.  Psychiatric/Behavioral: Negative for depression and suicidal ideas. The patient is not nervous/anxious and does not have  insomnia.     Objective  BP 105/68 mmHg  Pulse 73  Temp(Src) 98.2 F (36.8 C) (Oral)  Ht 5' 5.5" (1.664 m)  Wt 125 lb (56.7 kg)  BMI 20.48 kg/m2  SpO2 100%  LMP 10/19/2014  Physical Exam  Physical Exam  Constitutional: She is oriented to person, place, and time and well-developed, well-nourished, and in no distress. No distress.  HENT:  Head: Normocephalic and atraumatic.  Eyes: Conjunctivae are normal.  Neck: Neck  supple. No thyromegaly present.  Cardiovascular: Normal rate, regular rhythm and normal heart sounds.   No murmur heard. Pulmonary/Chest: Effort normal and breath sounds normal. She has no wheezes.  Abdominal: She exhibits no distension and no mass.  Musculoskeletal: She exhibits tenderness. She exhibits no edema.  Spasm over right sided SCM muscle. No decreased ROM  Lymphadenopathy:    She has no cervical adenopathy.  Neurological: She is alert and oriented to person, place, and time.  Skin: Skin is warm and dry. No rash noted. She is not diaphoretic.  Psychiatric: Memory, affect and judgment normal.    Lab Results  Component Value Date   TSH 0.70 07/05/2014   Lab Results  Component Value Date   WBC 4.9 07/05/2014   HGB 13.9 07/05/2014   HCT 41.5 07/05/2014   MCV 89.3 07/05/2014   PLT 230.0 07/05/2014   Lab Results  Component Value Date   CREATININE 0.7 07/05/2014   BUN 18 07/05/2014   NA 137 07/05/2014   K 4.5 07/05/2014   CL 103 07/05/2014   CO2 27 07/05/2014   Lab Results  Component Value Date   ALT 18 07/05/2014   AST 20 07/05/2014   ALKPHOS 37* 07/05/2014   BILITOT 1.0 07/05/2014   Lab Results  Component Value Date   CHOL 230* 07/05/2014   Lab Results  Component Value Date   HDL 49.50 07/05/2014   Lab Results  Component Value Date   LDLCALC 165* 07/05/2014   Lab Results  Component Value Date   TRIG 77.0 07/05/2014   Lab Results  Component Value Date   CHOLHDL 5 07/05/2014     Assessment & Plan   Neck pain on  right side With radicular symptoms at times down as far as her wrist, no acute injury. Discussed xray results with patient and options. For now she will continue Ibuprofen and Flexeril prn, she will continue care with chiropractic and she is referred for PT if symptoms worsen will need further imaging and referral.   GERD (gastroesophageal reflux disease) Has been taking 3 Ibuprofen and is encouraged to drop to 2 tabs with food    Anxiety and depression Doing well on 1/2 of a Lexapro every other day continue to wean off

## 2014-12-02 NOTE — Progress Notes (Signed)
Pre visit review using our clinic review tool, if applicable. No additional management support is needed unless otherwise documented below in the visit note. 

## 2015-01-06 ENCOUNTER — Ambulatory Visit: Payer: BC Managed Care – PPO | Admitting: Family Medicine

## 2015-01-13 ENCOUNTER — Ambulatory Visit (INDEPENDENT_AMBULATORY_CARE_PROVIDER_SITE_OTHER): Payer: BLUE CROSS/BLUE SHIELD | Admitting: Family Medicine

## 2015-01-13 ENCOUNTER — Encounter: Payer: Self-pay | Admitting: Family Medicine

## 2015-01-13 VITALS — BP 90/62 | HR 67 | Temp 98.3°F | Ht 66.0 in | Wt 123.4 lb

## 2015-01-13 DIAGNOSIS — M542 Cervicalgia: Secondary | ICD-10-CM

## 2015-01-13 DIAGNOSIS — F900 Attention-deficit hyperactivity disorder, predominantly inattentive type: Secondary | ICD-10-CM

## 2015-01-13 DIAGNOSIS — F329 Major depressive disorder, single episode, unspecified: Secondary | ICD-10-CM

## 2015-01-13 DIAGNOSIS — F419 Anxiety disorder, unspecified: Secondary | ICD-10-CM

## 2015-01-13 DIAGNOSIS — Z889 Allergy status to unspecified drugs, medicaments and biological substances status: Secondary | ICD-10-CM

## 2015-01-13 DIAGNOSIS — E559 Vitamin D deficiency, unspecified: Secondary | ICD-10-CM

## 2015-01-13 DIAGNOSIS — F909 Attention-deficit hyperactivity disorder, unspecified type: Secondary | ICD-10-CM

## 2015-01-13 DIAGNOSIS — F32A Depression, unspecified: Secondary | ICD-10-CM

## 2015-01-13 DIAGNOSIS — F411 Generalized anxiety disorder: Secondary | ICD-10-CM

## 2015-01-13 MED ORDER — AMPHETAMINE-DEXTROAMPHETAMINE 20 MG PO TABS: 20.0000 mg | ORAL_TABLET | Freq: Two times a day (BID) | ORAL | Status: DC

## 2015-01-13 MED ORDER — ALPRAZOLAM 0.25 MG PO TABS: 0.2500 mg | ORAL_TABLET | Freq: Two times a day (BID) | ORAL | Status: DC | PRN

## 2015-01-13 MED ORDER — AMPHETAMINE-DEXTROAMPHETAMINE 20 MG PO TABS
20.0000 mg | ORAL_TABLET | Freq: Two times a day (BID) | ORAL | Status: DC
Start: 2015-01-13 — End: 2015-01-13

## 2015-01-13 MED ORDER — MOMETASONE FUROATE 50 MCG/ACT NA SUSP: 2.0000 | Freq: Every day | NASAL | Status: DC

## 2015-01-13 NOTE — Patient Instructions (Signed)
Generalized Anxiety Disorder Generalized anxiety disorder (GAD) is a mental disorder. It interferes with life functions, including relationships, work, and school. GAD is different from normal anxiety, which everyone experiences at some point in their lives in response to specific life events and activities. Normal anxiety actually helps us prepare for and get through these life events and activities. Normal anxiety goes away after the event or activity is over.  GAD causes anxiety that is not necessarily related to specific events or activities. It also causes excess anxiety in proportion to specific events or activities. The anxiety associated with GAD is also difficult to control. GAD can vary from mild to severe. People with severe GAD can have intense waves of anxiety with physical symptoms (panic attacks).  SYMPTOMS The anxiety and worry associated with GAD are difficult to control. This anxiety and worry are related to many life events and activities and also occur more days than not for 6 months or longer. People with GAD also have three or more of the following symptoms (one or more in children):  Restlessness.   Fatigue.  Difficulty concentrating.   Irritability.  Muscle tension.  Difficulty sleeping or unsatisfying sleep. DIAGNOSIS GAD is diagnosed through an assessment by your health care provider. Your health care provider will ask you questions aboutyour mood,physical symptoms, and events in your life. Your health care provider may ask you about your medical history and use of alcohol or drugs, including prescription medicines. Your health care provider may also do a physical exam and blood tests. Certain medical conditions and the use of certain substances can cause symptoms similar to those associated with GAD. Your health care provider may refer you to a mental health specialist for further evaluation. TREATMENT The following therapies are usually used to treat GAD:    Medication. Antidepressant medication usually is prescribed for long-term daily control. Antianxiety medicines may be added in severe cases, especially when panic attacks occur.   Talk therapy (psychotherapy). Certain types of talk therapy can be helpful in treating GAD by providing support, education, and guidance. A form of talk therapy called cognitive behavioral therapy can teach you healthy ways to think about and react to daily life events and activities.  Stress managementtechniques. These include yoga, meditation, and exercise and can be very helpful when they are practiced regularly. A mental health specialist can help determine which treatment is best for you. Some people see improvement with one therapy. However, other people require a combination of therapies. Document Released: 02/12/2013 Document Revised: 03/04/2014 Document Reviewed: 02/12/2013 Exit[employer] Patient Information 2015 [employer]. This information is not intended to replace advice given to you by your health care provider. Make sure you discuss any questions you have with your health care provider.  

## 2015-01-19 ENCOUNTER — Encounter: Payer: Self-pay | Admitting: Family Medicine

## 2015-01-19 NOTE — Assessment & Plan Note (Signed)
Doing well off of Lexapro, may use infrequent Alprazolam and consider restarting Lexapro if things change

## 2015-01-19 NOTE — Progress Notes (Signed)
[name]  [id number] June 19, 1971 01/19/2015      Progress Note-Follow Up  Subjective  Chief Complaint  Chief Complaint  Patient presents with  . Follow-up    HPI  Patient is a 44 y.o. female in today for routine medical care. Patient is in today for follow-up. Is feeling better. Her neck and arm pain are improved status post a course of physical therapy and she is slowly starting back to exercise without a flare. No new concerns in this regard. Feels well. Is off of Lexapro and feels she is doing well. Has only occasional episodes of high anxiety which are tolerable. Continues to do well on low-dose Adderall. Denies headaches or acute concerns with use. Denies CP/palp/SOB/HA/congestion/fevers/GI or GU c/o. Taking meds as prescribed  Past Medical History  Diagnosis Date  . GERD (gastroesophageal reflux disease)   . IBS (irritable bowel syndrome)   . Interstitial cystitis   . Premature atrial contractions 2012  . Adult ADHD 2011    SYMPTOMS ONLY  . Vitamin D deficiency 07/02/2011  . Poor concentration 07/02/2011  . Multiple allergies 07/02/2011  . Anxiety 07/31/2011  . UTI (lower urinary tract infection) 08/28/2011  . Otitis media 03/16/2012  . Shoulder pain, left 04/11/2012  . Alopecia   . Anxiety and depression 07/31/2011  . Depression   . Perimenopausal 07/22/2013  . HSV-2 (herpes simplex virus 2) infection   . Chest pain 09/22/10    Sharp pains in left breast & sometimes in the right breast as well. Random in occurence & nonexertional. Also has occasional SOB & DOE when going up stairs & occasionally when working out. Nuclear Stress Treadmill 09/29/10 -   . SOB (shortness of breath) 09/22/10    ECHO 09/29/10 - NL LVF, trivial PE, trivial TR/PR.    Past Surgical History  Procedure Laterality Date  . Tonsillectomy  1982  . Rhinoplasty  2003  . Breast enhancement surgery  2007  . Cesarean section  2001 and 2004    x2  . Laparoscopy      twice  . Tubal ligation       Family History  Problem Relation Age of Onset  . Heart disease Mother     MVP  . Anxiety disorder Mother   . Mitral valve prolapse Mother   . Hypertension Brother   . Obesity Brother   . Diabetes Brother     DM, obese  . Allergies Daughter   . Asthma Daughter   . Allergies Son   . Asthma Son   . Kidney disease Maternal Grandmother     uremia    History   Social History  . Marital Status: Married    Spouse Name: N/A  . Number of Children: N/A  . Years of Education: N/A   Occupational History  . Not on file.   Social History Main Topics  . Smoking status: Never Smoker   . Smokeless tobacco: Never Used  . Alcohol Use: Yes     Comment: RARELY  . Drug Use: No  . Sexual Activity: Yes    Birth Control/ Protection: Surgical     Comment: Tubal lig   Other Topics Concern  . Not on file   Social History Narrative    Current Outpatient Prescriptions on File Prior to Visit  Medication Sig Dispense Refill  . aspirin 81 MG tablet Take 81 mg by mouth daily.      Marland Kitchen AVAR CLEANSER 10-5 % EMUL     . Biotin  2500 MCG CAPS Take 1 capsule by mouth daily.      . Calcium Citrate-Vitamin D 250-200 MG-UNIT TABS Take 1 tablet by mouth daily. 30 each 11  . Cholecalciferol (VITAMIN D) 2000 UNITS CAPS Take 1 capsule (2,000 Units total) by mouth once. 30 capsule 11  . cyclobenzaprine (FLEXERIL) 10 MG tablet Take 1 tablet (10 mg total) by mouth 3 (three) times daily as needed for muscle spasms. 30 tablet 2  . fexofenadine (ALLEGRA) 180 MG tablet Take 180 mg by mouth daily.      . finasteride (PROSCAR) 5 MG tablet Take 5 mg by mouth daily. Take 1/2 tab daily    . fish oil-omega-3 fatty acids 1000 MG capsule Take 2 g by mouth daily.     . Multiple Vitamin (MULTIVITAMIN) capsule Take 1 capsule by mouth daily.      . nitrofurantoin (MACRODANTIN) 50 MG capsule As needed    . Norethindrone-Ethinyl Estradiol-Fe Biphas (LO LOESTRIN FE) 1 MG-10 MCG / 10 MCG tablet Take 1 tablet by mouth daily. 3  Package 0  . Polysaccharide Iron Complex (POLY-IRON 150 PO) Take 150 mg by mouth 2 (two) times daily.    Marland Kitchen senna (SENOKOT) 8.6 MG TABS Take 1 tablet by mouth daily.    Marland Kitchen spironolactone (ALDACTONE) 25 MG tablet Take 25 mg by mouth daily. Take 2 tabs daily    . tretinoin (RETIN-A) 0.025 % cream     . valACYclovir (VALTREX) 500 MG tablet Take 1 tablet (500 mg total) by mouth daily. 30 tablet 11  . zinc gluconate 50 MG tablet Take 50 mg by mouth daily.     No current facility-administered medications on file prior to visit.    No Known Allergies  Review of Systems  Review of Systems  Constitutional: Negative for fever and malaise/fatigue.  HENT: Negative for congestion.   Eyes: Negative for discharge.  Respiratory: Negative for shortness of breath.   Cardiovascular: Negative for chest pain, palpitations and leg swelling.  Gastrointestinal: Negative for nausea, abdominal pain and diarrhea.  Genitourinary: Negative for dysuria.  Musculoskeletal: Negative for falls.  Skin: Negative for rash.  Neurological: Negative for loss of consciousness and headaches.  Endo/Heme/Allergies: Negative for polydipsia.  Psychiatric/Behavioral: Negative for depression and suicidal ideas. The patient is not nervous/anxious and does not have insomnia.     Objective  BP 90/62 mmHg  Pulse 67  Temp(Src) 98.3 F (36.8 C) (Oral)  Ht 5\' 6"  (1.676 m)  Wt 123 lb 6 oz (55.963 kg)  BMI 19.92 kg/m2  SpO2 98%  Physical Exam  Physical Exam  Constitutional: She is oriented to person, place, and time and well-developed, well-nourished, and in no distress. No distress.  HENT:  Head: Normocephalic and atraumatic.  Eyes: Conjunctivae are normal.  Neck: Neck supple. No thyromegaly present.  Cardiovascular: Normal rate, regular rhythm and normal heart sounds.   No murmur heard. Pulmonary/Chest: Effort normal and breath sounds normal. She has no wheezes.  Abdominal: She exhibits no distension and no mass.    Musculoskeletal: She exhibits no edema.  Lymphadenopathy:    She has no cervical adenopathy.  Neurological: She is alert and oriented to person, place, and time.  Skin: Skin is warm and dry. No rash noted. She is not diaphoretic.  Psychiatric: Memory, affect and judgment normal.    Lab Results  Component Value Date   TSH 0.70 07/05/2014   Lab Results  Component Value Date   WBC 4.9 07/05/2014   HGB 13.9 07/05/2014  HCT 41.5 07/05/2014   MCV 89.3 07/05/2014   PLT 230.0 07/05/2014   Lab Results  Component Value Date   CREATININE 0.7 07/05/2014   BUN 18 07/05/2014   NA 137 07/05/2014   K 4.5 07/05/2014   CL 103 07/05/2014   CO2 27 07/05/2014   Lab Results  Component Value Date   ALT 18 07/05/2014   AST 20 07/05/2014   ALKPHOS 37* 07/05/2014   BILITOT 1.0 07/05/2014   Lab Results  Component Value Date   CHOL 230* 07/05/2014   Lab Results  Component Value Date   HDL 49.50 07/05/2014   Lab Results  Component Value Date   LDLCALC 165* 07/05/2014   Lab Results  Component Value Date   TRIG 77.0 07/05/2014   Lab Results  Component Value Date   CHOLHDL 5 07/05/2014     Assessment & Plan  Adult ADHD Doing well on stable dose of Adderall no changes today   Anxiety and depression Doing well off of Lexapro, may use infrequent Alprazolam and consider restarting Lexapro if things change   Neck pain on right side Doing well after sports med eval and course of physical therapy. No need for further meds. May incresae exercise gradually as tolerated

## 2015-01-19 NOTE — Assessment & Plan Note (Addendum)
Doing well on stable dose of Adderall no changes today

## 2015-01-19 NOTE — Assessment & Plan Note (Signed)
Doing well after sports med eval and course of physical therapy. No need for further meds. May incresae exercise gradually as tolerated

## 2015-02-07 ENCOUNTER — Other Ambulatory Visit: Payer: Self-pay | Admitting: *Deleted

## 2015-02-07 MED ORDER — NORETHIN-ETH ESTRAD-FE BIPHAS 1 MG-10 MCG / 10 MCG PO TABS: 1.0000 | ORAL_TABLET | Freq: Every day | ORAL | Status: DC

## 2015-02-07 NOTE — Telephone Encounter (Signed)
Pt stated doing very well on new OCP Dr Phineas Real prescribed. Refills sent to pharmacy. KW CMA

## 2015-04-09 ENCOUNTER — Other Ambulatory Visit: Payer: Self-pay | Admitting: Oral and Maxillofacial Surgery

## 2015-04-16 ENCOUNTER — Telehealth: Payer: Self-pay | Admitting: *Deleted

## 2015-04-16 ENCOUNTER — Ambulatory Visit (INDEPENDENT_AMBULATORY_CARE_PROVIDER_SITE_OTHER): Payer: 59 | Admitting: Gynecology

## 2015-04-16 ENCOUNTER — Encounter: Payer: Self-pay | Admitting: Gynecology

## 2015-04-16 VITALS — BP 120/74

## 2015-04-16 DIAGNOSIS — N63 Unspecified lump in unspecified breast: Secondary | ICD-10-CM

## 2015-04-16 DIAGNOSIS — N6310 Unspecified lump in the right breast, unspecified quadrant: Secondary | ICD-10-CM

## 2015-04-16 DIAGNOSIS — N6320 Unspecified lump in the left breast, unspecified quadrant: Secondary | ICD-10-CM

## 2015-04-16 NOTE — Progress Notes (Signed)
[name] [birth year] [id number]        44 y.o.  [id number] Presents having felt a mass in the right and left breast of the last week or so. Never noticed this before. Not tender. No nipple discharge or other complaints. Last mammogram a year ago  Past medical history,surgical history, problem list, medications, allergies, family history and social history were all reviewed and documented in the EPIC chart.  Directed ROS with pertinent positives and negatives documented in the history of present illness/assessment and plan.  Exam: [name] assistant Filed Vitals:   04/16/15 1054  BP: 120/74   General appearance:  Normal Both breast examined lying and sitting without masses retractions discharge adenopathy. Bilateral implants noted. The areas the patient is pointing to palpate normal with normal granular rest tissue palpated. Physical Exam  Pulmonary/Chest:       Assessment/Plan:  44 y.o. [id number] with bilateral patient perceived small granular masses. Exam normal to the physician. Suspect normal physiologic changes. Will pursue diagnostic mammography and ultrasound over these areas for completeness. If normal then patient will continue with self breast exams as long as these areas remain unchanged will follow. If they change despite normal study she knows to follow up with me. If any abnormalities on the studies then will triage based on results    Anastasio Auerbach MD, 11:09 AM 04/16/2015

## 2015-04-16 NOTE — Telephone Encounter (Signed)
-----   Message from Anastasio Auerbach, MD sent at 04/16/2015 11:12 AM EDT ----- Schedule bilateral diagnostic mammography and ultrasound reference patient perceived bilateral small granular masses left breast at 3:00 position several fingerbreadths off the areola right breast 9:00 position several fingerbreadths off the areola

## 2015-04-16 NOTE — Telephone Encounter (Signed)
Orders placed at breast center, they will contact pt to schedule.

## 2015-04-16 NOTE — Patient Instructions (Signed)
Office will call you to arrange the mammogram and ultrasound. If you do not hear within the next several days call the office.

## 2015-04-17 NOTE — Telephone Encounter (Signed)
Left message for pt to call, appointment on 04/23/15 @ 1:50pm

## 2015-04-18 ENCOUNTER — Ambulatory Visit (INDEPENDENT_AMBULATORY_CARE_PROVIDER_SITE_OTHER): Payer: 59 | Admitting: Women's Health

## 2015-04-18 ENCOUNTER — Encounter: Payer: Self-pay | Admitting: Women's Health

## 2015-04-18 VITALS — BP 110/70

## 2015-04-18 DIAGNOSIS — Z113 Encounter for screening for infections with a predominantly sexual mode of transmission: Secondary | ICD-10-CM

## 2015-04-18 LAB — HEPATITIS C ANTIBODY: HCV AB: NEGATIVE

## 2015-04-18 LAB — HEPATITIS B SURFACE ANTIGEN: Hepatitis B Surface Ag: NEGATIVE

## 2015-04-18 NOTE — Progress Notes (Signed)
Patient ID: [name], female   DOB: July 22, 1971, 44 y.o.   MRN: [id number] Presents requesting STD screen, husband questionable infidelity. Currently in counseling. Denies any abdominal pain,  discharge, urinary symptoms, or fever.  Exam: Appears well. Abdomen soft nontender. External genitalia within normal limits, speculum exam cervix pink without lesion or discharge GC/Chlamydia culture taken. Bimanual no CMT or tenderness.  STD screen  Plan: GC/Chlamydia culture taken.  HIV, hep B, C, RPR. Continue counseling.

## 2015-04-19 LAB — HIV ANTIBODY (ROUTINE TESTING W REFLEX): HIV: NONREACTIVE

## 2015-04-19 LAB — GC/CHLAMYDIA PROBE AMP
CT Probe RNA: NEGATIVE
GC Probe RNA: NEGATIVE

## 2015-04-19 LAB — RPR

## 2015-04-21 NOTE — Telephone Encounter (Signed)
Pt informed on OV 04/18/15 of the below.

## 2015-04-23 ENCOUNTER — Other Ambulatory Visit: Payer: Self-pay

## 2015-04-24 ENCOUNTER — Telehealth: Payer: Self-pay | Admitting: *Deleted

## 2015-04-24 NOTE — Telephone Encounter (Signed)
Pt informed with negative STD results on 04/18/15

## 2015-04-30 ENCOUNTER — Ambulatory Visit
Admission: RE | Admit: 2015-04-30 | Discharge: 2015-04-30 | Disposition: A | Discharge status: Home / Self Care | Payer: 59 | Source: Ambulatory Visit | Attending: Gynecology | Admitting: Gynecology

## 2015-04-30 DIAGNOSIS — N6320 Unspecified lump in the left breast, unspecified quadrant: Secondary | ICD-10-CM

## 2015-04-30 DIAGNOSIS — N6310 Unspecified lump in the right breast, unspecified quadrant: Secondary | ICD-10-CM

## 2015-05-02 ENCOUNTER — Ambulatory Visit
Admission: RE | Admit: 2015-05-02 | Discharge: 2015-05-02 | Disposition: A | Discharge status: Home / Self Care | Payer: 59 | Source: Ambulatory Visit | Attending: Gynecology | Admitting: Gynecology

## 2015-05-15 ENCOUNTER — Encounter: Payer: Self-pay | Admitting: Family Medicine

## 2015-05-15 ENCOUNTER — Ambulatory Visit (INDEPENDENT_AMBULATORY_CARE_PROVIDER_SITE_OTHER): Payer: 59 | Admitting: Family Medicine

## 2015-05-15 VITALS — BP 86/62 | HR 77 | Temp 98.1°F | Ht 66.0 in | Wt 116.0 lb

## 2015-05-15 DIAGNOSIS — F418 Other specified anxiety disorders: Secondary | ICD-10-CM | POA: Diagnosis not present

## 2015-05-15 DIAGNOSIS — F419 Anxiety disorder, unspecified: Secondary | ICD-10-CM

## 2015-05-15 DIAGNOSIS — F329 Major depressive disorder, single episode, unspecified: Secondary | ICD-10-CM

## 2015-05-15 DIAGNOSIS — F411 Generalized anxiety disorder: Secondary | ICD-10-CM

## 2015-05-15 DIAGNOSIS — E559 Vitamin D deficiency, unspecified: Secondary | ICD-10-CM

## 2015-05-15 DIAGNOSIS — R002 Palpitations: Secondary | ICD-10-CM | POA: Diagnosis not present

## 2015-05-15 DIAGNOSIS — F900 Attention-deficit hyperactivity disorder, predominantly inattentive type: Secondary | ICD-10-CM

## 2015-05-15 DIAGNOSIS — F32A Depression, unspecified: Secondary | ICD-10-CM

## 2015-05-15 DIAGNOSIS — F909 Attention-deficit hyperactivity disorder, unspecified type: Secondary | ICD-10-CM

## 2015-05-15 MED ORDER — ALPRAZOLAM 0.25 MG PO TABS: 0.2500 mg | ORAL_TABLET | Freq: Two times a day (BID) | ORAL | Status: DC | PRN

## 2015-05-15 NOTE — Progress Notes (Signed)
Pre visit review using our clinic review tool, if applicable. No additional management support is needed unless otherwise documented below in the visit note. 

## 2015-05-15 NOTE — Patient Instructions (Signed)

## 2015-05-15 NOTE — Progress Notes (Signed)
[name]  [id number] June 14, 1971 05/15/2015      Progress Note-Follow Up  Subjective  Chief Complaint  Chief Complaint  Patient presents with  . Follow-up    HPI  Patient is a 44 y.o. female in today for routine medical care. She is doing fairly well but is under a great deal of stress with a difficult marriage. She feels she does not need a daily anti anxiety med at this time but has had some episodes of palpitations she thinks have been stress related that Alprazolam might have helped with. Denies CP/SOB/HA/congestion/fevers/GI or GU c/o. Taking meds as prescribed  Past Medical History  Diagnosis Date  . GERD (gastroesophageal reflux disease)   . IBS (irritable bowel syndrome)   . Interstitial cystitis   . Poor concentration 07/02/2011  . Multiple allergies 07/02/2011  . UTI (lower urinary tract infection) 08/28/2011  . Otitis media 03/16/2012  . Shoulder pain, left 04/11/2012  . Alopecia   . Anxiety and depression 07/31/2011  . Perimenopausal 07/22/2013  . HSV-2 (herpes simplex virus 2) infection   . Chest pain 09/22/10    Sharp pains in left breast & sometimes in the right breast as well. Random in occurence & nonexertional. Also has occasional SOB & DOE when going up stairs & occasionally when working out. Nuclear Stress Treadmill 09/29/10 -   . SOB (shortness of breath) 09/22/10    ECHO 09/29/10 - NL LVF, trivial PE, trivial TR/PR.    Past Surgical History  Procedure Laterality Date  . Tonsillectomy  1982  . Rhinoplasty  2003  . Breast enhancement surgery  2007  . Cesarean section  2001 and 2004    x2  . Laparoscopy      twice  . Tubal ligation      Family History  Problem Relation Age of Onset  . Heart disease Mother     MVP  . Anxiety disorder Mother   . Mitral valve prolapse Mother   . Hypertension Brother   . Obesity Brother   . Diabetes Brother     DM, obese  . Allergies Daughter   . Asthma Daughter   . Allergies Son   . Asthma Son   . Kidney  disease Maternal Grandmother     uremia    History   Social History  . Marital Status: Married    Spouse Name: N/A  . Number of Children: N/A  . Years of Education: N/A   Occupational History  . Not on file.   Social History Main Topics  . Smoking status: Never Smoker   . Smokeless tobacco: Never Used  . Alcohol Use: Yes     Comment: RARELY  . Drug Use: No  . Sexual Activity: Yes    Birth Control/ Protection: Surgical     Comment: Tubal lig   Other Topics Concern  . Not on file   Social History Narrative    Current Outpatient Prescriptions on File Prior to Visit  Medication Sig Dispense Refill  . amphetamine-dextroamphetamine (ADDERALL) 20 MG tablet Take 1 tablet (20 mg total) by mouth 2 (two) times daily. May 2016 rx 60 tablet 0  . aspirin 81 MG tablet Take 81 mg by mouth daily.      . Biotin 2500 MCG CAPS Take 1 capsule by mouth daily.      . Calcium Citrate-Vitamin D 250-200 MG-UNIT TABS Take 1 tablet by mouth daily. 30 each 11  . Cholecalciferol (VITAMIN D) 2000 UNITS CAPS Take 1 capsule (  2,000 Units total) by mouth once. 30 capsule 11  . fexofenadine (ALLEGRA) 180 MG tablet Take 180 mg by mouth daily.      . finasteride (PROSCAR) 5 MG tablet Take 5 mg by mouth daily. Take 1/2 tab daily    . fish oil-omega-3 fatty acids 1000 MG capsule Take 2 g by mouth daily.     . Multiple Vitamin (MULTIVITAMIN) capsule Take 1 capsule by mouth daily.      . nitrofurantoin (MACRODANTIN) 50 MG capsule As needed    . Norethindrone-Ethinyl Estradiol-Fe Biphas (LO LOESTRIN FE) 1 MG-10 MCG / 10 MCG tablet Take 1 tablet by mouth daily. 3 Package 4  . Polysaccharide Iron Complex (POLY-IRON 150 PO) Take 150 mg by mouth 2 (two) times daily.    Marland Kitchen senna (SENOKOT) 8.6 MG TABS Take 1 tablet by mouth daily.    Marland Kitchen spironolactone (ALDACTONE) 25 MG tablet Take 25 mg by mouth daily. Take 2 tabs daily    . tretinoin (RETIN-A) 0.025 % cream     . valACYclovir (VALTREX) 500 MG tablet Take 1 tablet (500  mg total) by mouth daily. 30 tablet 11  . zinc gluconate 50 MG tablet Take 50 mg by mouth daily.    Marland Kitchen ALPRAZolam (XANAX) 0.25 MG tablet Take 1 tablet (0.25 mg total) by mouth 2 (two) times daily as needed for anxiety. (Patient not taking: Reported on 04/16/2015) 20 tablet 1  . AVAR CLEANSER 10-5 % EMUL     . cyclobenzaprine (FLEXERIL) 10 MG tablet Take 1 tablet (10 mg total) by mouth 3 (three) times daily as needed for muscle spasms. (Patient not taking: Reported on 05/15/2015) 30 tablet 2  . mometasone (NASONEX) 50 MCG/ACT nasal spray Place 2 sprays into the nose daily. (Patient not taking: Reported on 05/15/2015) 17 g 6   No current facility-administered medications on file prior to visit.    No Known Allergies  Review of Systems  Review of Systems  Constitutional: Negative for fever and malaise/fatigue.  HENT: Negative for congestion.   Eyes: Negative for discharge.  Respiratory: Negative for shortness of breath.   Cardiovascular: Positive for palpitations. Negative for chest pain and leg swelling.  Gastrointestinal: Negative for nausea, abdominal pain and diarrhea.  Genitourinary: Negative for dysuria.  Musculoskeletal: Negative for falls.  Skin: Negative for rash.  Neurological: Negative for loss of consciousness and headaches.  Endo/Heme/Allergies: Negative for polydipsia.  Psychiatric/Behavioral: Positive for depression. Negative for suicidal ideas. The patient is nervous/anxious. The patient does not have insomnia.     Objective  BP 86/62 mmHg  Pulse 77  Temp(Src) 98.1 F (36.7 C) (Oral)  Ht 5\' 6"  (1.676 m)  Wt 116 lb (52.617 kg)  BMI 18.73 kg/m2  SpO2 98%  LMP 05/08/2015  Physical Exam  Physical Exam  Constitutional: She is oriented to person, place, and time and well-developed, well-nourished, and in no distress. No distress.  HENT:  Head: Normocephalic and atraumatic.  Eyes: Conjunctivae are normal.  Neck: Neck supple. No thyromegaly present.  Cardiovascular:  Normal rate, regular rhythm and normal heart sounds.   No murmur heard. Pulmonary/Chest: Effort normal and breath sounds normal. She has no wheezes.  Abdominal: She exhibits no distension and no mass.  Musculoskeletal: She exhibits no edema.  Lymphadenopathy:    She has no cervical adenopathy.  Neurological: She is alert and oriented to person, place, and time.  Skin: Skin is warm and dry. No rash noted. She is not diaphoretic.  Psychiatric: Memory, affect and judgment  normal.    Lab Results  Component Value Date   TSH 0.70 07/05/2014   Lab Results  Component Value Date   WBC 4.9 07/05/2014   HGB 13.9 07/05/2014   HCT 41.5 07/05/2014   MCV 89.3 07/05/2014   PLT 230.0 07/05/2014   Lab Results  Component Value Date   CREATININE 0.7 07/05/2014   BUN 18 07/05/2014   NA 137 07/05/2014   K 4.5 07/05/2014   CL 103 07/05/2014   CO2 27 07/05/2014   Lab Results  Component Value Date   ALT 18 07/05/2014   AST 20 07/05/2014   ALKPHOS 37* 07/05/2014   BILITOT 1.0 07/05/2014   Lab Results  Component Value Date   CHOL 230* 07/05/2014   Lab Results  Component Value Date   HDL 49.50 07/05/2014   Lab Results  Component Value Date   LDLCALC 165* 07/05/2014   Lab Results  Component Value Date   TRIG 77.0 07/05/2014   Lab Results  Component Value Date   CHOLHDL 5 07/05/2014     Assessment & Plan  Adult ADHD Good response to Adderall may continue same  Anxiety and depression Doing well but under a great deal of stress. May use Alprazolam sparingly.  Palpitations Has had a couple of episodes recently she believes are stress related. May use Alprazolam sparingly during these episodes.

## 2015-06-02 NOTE — Assessment & Plan Note (Addendum)
Doing well but under a great deal of stress. May use Alprazolam sparingly.

## 2015-06-02 NOTE — Assessment & Plan Note (Signed)
Has had a couple of episodes recently she believes are stress related. May use Alprazolam sparingly during these episodes.

## 2015-06-02 NOTE — Assessment & Plan Note (Signed)
Good response to Adderall may continue same

## 2015-07-14 ENCOUNTER — Encounter: Payer: BC Managed Care – PPO | Admitting: Gynecology

## 2015-07-16 ENCOUNTER — Encounter: Payer: Self-pay | Admitting: Gynecology

## 2015-07-16 ENCOUNTER — Ambulatory Visit (INDEPENDENT_AMBULATORY_CARE_PROVIDER_SITE_OTHER): Payer: 59 | Admitting: Gynecology

## 2015-07-16 VITALS — BP 106/60 | Ht 65.5 in | Wt 119.0 lb

## 2015-07-16 DIAGNOSIS — Z01419 Encounter for gynecological examination (general) (routine) without abnormal findings: Secondary | ICD-10-CM

## 2015-07-16 DIAGNOSIS — Z23 Encounter for immunization: Secondary | ICD-10-CM

## 2015-07-16 LAB — CBC WITH DIFFERENTIAL/PLATELET
Basophils Absolute: 0 10*3/uL (ref 0.0–0.1)
Basophils Relative: 0 % (ref 0–1)
EOS PCT: 1 % (ref 0–5)
Eosinophils Absolute: 0.1 10*3/uL (ref 0.0–0.7)
HCT: 39.5 % (ref 36.0–46.0)
Hemoglobin: 13 g/dL (ref 12.0–15.0)
Lymphocytes Relative: 28 % (ref 12–46)
Lymphs Abs: 1.7 10*3/uL (ref 0.7–4.0)
MCH: 29.7 pg (ref 26.0–34.0)
MCHC: 32.9 g/dL (ref 30.0–36.0)
MCV: 90.4 fL (ref 78.0–100.0)
MONO ABS: 0.3 10*3/uL (ref 0.1–1.0)
MPV: 11.2 fL (ref 8.6–12.4)
Monocytes Relative: 5 % (ref 3–12)
Neutro Abs: 3.9 10*3/uL (ref 1.7–7.7)
Neutrophils Relative %: 66 % (ref 43–77)
Platelets: 210 10*3/uL (ref 150–400)
RBC: 4.37 MIL/uL (ref 3.87–5.11)
RDW: 12.5 % (ref 11.5–15.5)
WBC: 5.9 10*3/uL (ref 4.0–10.5)

## 2015-07-16 LAB — COMPREHENSIVE METABOLIC PANEL
ALT: 11 U/L (ref 6–29)
AST: 14 U/L (ref 10–35)
Albumin: 4.4 g/dL (ref 3.6–5.1)
Alkaline Phosphatase: 34 U/L (ref 33–115)
BUN: 20 mg/dL (ref 7–25)
CALCIUM: 9.3 mg/dL (ref 8.6–10.2)
CO2: 27 mmol/L (ref 20–31)
Chloride: 103 mmol/L (ref 98–110)
Creat: 0.68 mg/dL (ref 0.50–1.10)
GLUCOSE: 89 mg/dL (ref 65–99)
POTASSIUM: 3.6 mmol/L (ref 3.5–5.3)
Sodium: 139 mmol/L (ref 135–146)
Total Bilirubin: 0.9 mg/dL (ref 0.2–1.2)
Total Protein: 6.6 g/dL (ref 6.1–8.1)

## 2015-07-16 LAB — TSH: TSH: 1.101 u[IU]/mL (ref 0.350–4.500)

## 2015-07-16 LAB — LIPID PANEL
CHOL/HDL RATIO: 4.1 ratio (ref <=5.0)
Cholesterol: 164 mg/dL (ref 125–200)
HDL: 40 mg/dL — ABNORMAL LOW (ref >=46)
LDL CALC: 110 mg/dL (ref <130)
Triglycerides: 70 mg/dL (ref <150)
VLDL: 14 mg/dL (ref <30)

## 2015-07-16 MED ORDER — NORETHIN-ETH ESTRAD-FE BIPHAS 1 MG-10 MCG / 10 MCG PO TABS: 1.0000 | ORAL_TABLET | Freq: Every day | ORAL | Status: DC

## 2015-07-16 MED ORDER — VALACYCLOVIR HCL 500 MG PO TABS: 500.0000 mg | ORAL_TABLET | Freq: Every day | ORAL | Status: DC

## 2015-07-16 MED ORDER — NITROFURANTOIN MONOHYD MACRO 100 MG PO CAPS: 100.0000 mg | ORAL_CAPSULE | Freq: Once | ORAL | Status: DC

## 2015-07-16 NOTE — Patient Instructions (Signed)

## 2015-07-16 NOTE — Progress Notes (Signed)
[name] 1970/11/18 [id number]        44 y.o.  [id number] for annual exam.  Doing well without complaints  Past medical history,surgical history, problem list, medications, allergies, family history and social history were all reviewed and documented as reviewed in the EPIC chart.  ROS:  Performed with pertinent positives and negatives included in the history, assessment and plan.   Additional significant findings :  none   Exam: [name] Counsellor Vitals:   07/16/15 1216  BP: 106/60  Height: 5' 5.5" (1.664 m)  Weight: 119 lb (53.978 kg)   General appearance:  Normal affect, orientation and appearance. Skin: Grossly normal HEENT: Without gross lesions.  No cervical or supraclavicular adenopathy. Thyroid normal.  Lungs:  Clear without wheezing, rales or rhonchi Cardiac: RR, without RMG Abdominal:  Soft, nontender, without masses, guarding, rebound, organomegaly or hernia Breasts:  Examined lying and sitting without masses, retractions, discharge or axillary adenopathy.  Bilateral implants noted Pelvic:  Ext/BUS/vagina normal  Cervix normal  Uterus anteverted, normal size, shape and contour, midline and mobile nontender   Adnexa  Without masses or tenderness    Anus and perineum  Normal   Rectovaginal  Normal sphincter tone without palpated masses or tenderness.    Assessment/Plan:  44 y.o. [id number] female for annual exam with regular menses, oral contraceptives/BTL.   1. Premature menopause.  Patient underwent premature menopause and is on Loestrin 120 equivalent for HRT/menstrual regulation. She is doing well on this and wants to continue. She is otherwise healthy without medical issues and active, never smoker. Refill 1 year provided. 2. Pap smear/HPV negative 2015. No Pap smear done today. No history of abnormal Pap smears previously. 3. Mammography 04/2015 with ultrasounds due to questionable palpable areas on self-exam. Physician exam was negative. Mammogram was negative.  Ultrasound showed small cyst with recommended repeat mammogram in one year. Exam today is normal.  SBE monthly reviewed. 4. 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 Refill 1 year provided at her request. 5. History of frequent UTIs following intercourse. Is on Macrodantin and requests refill. Macrodantin 100 mg #30 with 2 refills provided. 6. Health maintenance. Patient requests baseline labs to include CBC, comprehensive metabolic panel, lipid profile, urinalysis, TSH. Follow up in one year, sooner as needed.   Anastasio Auerbach MD, 12:34 PM 07/16/2015

## 2015-07-16 NOTE — Addendum Note (Signed)
Addended by: Nelva Nay on: 07/16/2015 12:49 PM   Modules accepted: Orders

## 2015-07-17 LAB — URINALYSIS W MICROSCOPIC + REFLEX CULTURE
Bacteria, UA: NONE SEEN [HPF]
Bilirubin Urine: NEGATIVE
Casts: NONE SEEN [LPF]
Crystals: NONE SEEN [HPF]
Glucose, UA: NEGATIVE
HGB URINE DIPSTICK: NEGATIVE
KETONES UR: NEGATIVE
Leukocytes, UA: NEGATIVE
NITRITE: NEGATIVE
PH: 5.5 (ref 5.0–8.0)
Protein, ur: NEGATIVE
SPECIFIC GRAVITY, URINE: 1.027 (ref 1.001–1.035)
Squamous Epithelial / LPF: NONE SEEN [HPF] (ref <=5)
WBC, UA: NONE SEEN WBC/HPF (ref <=5)
YEAST: NONE SEEN [HPF]

## 2015-07-18 ENCOUNTER — Telehealth: Payer: Self-pay

## 2015-07-18 ENCOUNTER — Other Ambulatory Visit: Payer: Self-pay | Admitting: Gynecology

## 2015-07-18 DIAGNOSIS — N3001 Acute cystitis with hematuria: Secondary | ICD-10-CM

## 2015-07-18 MED ORDER — FLUCONAZOLE 150 MG PO TABS: ORAL_TABLET | ORAL | Status: DC

## 2015-07-18 MED ORDER — SULFAMETHOXAZOLE-TRIMETHOPRIM 800-160 MG PO TABS: 1.0000 | ORAL_TABLET | Freq: Two times a day (BID) | ORAL | Status: DC

## 2015-07-18 NOTE — Telephone Encounter (Signed)
okay

## 2015-07-18 NOTE — Telephone Encounter (Signed)
Patient asked if you would prescribed 2 Diflucan tablets for her to since she is having to take the Septra. She said she is very sensitive to antibiotics usually causing yeast infection and sometimes it takes 2 tablets.

## 2015-07-18 NOTE — Telephone Encounter (Signed)
-----   Message from Anastasio Auerbach, MD sent at 07/18/2015  2:14 PM EDT ----- Tell patient her urine did grow out a low level of bacteria. I want to cover her with Septra DS 1 by mouth twice a day 3 days

## 2015-07-18 NOTE — Telephone Encounter (Signed)
Rx sent. Patient informed. 

## 2015-07-19 LAB — URINE CULTURE

## 2015-08-15 ENCOUNTER — Other Ambulatory Visit: Payer: 59

## 2015-08-15 DIAGNOSIS — N3001 Acute cystitis with hematuria: Secondary | ICD-10-CM

## 2015-08-16 LAB — URINALYSIS W MICROSCOPIC + REFLEX CULTURE
BILIRUBIN URINE: NEGATIVE
Casts: NONE SEEN [LPF]
Crystals: NONE SEEN [HPF]
Glucose, UA: NEGATIVE
Hgb urine dipstick: NEGATIVE
KETONES UR: NEGATIVE
LEUKOCYTES UA: NEGATIVE
Nitrite: NEGATIVE
PH: 6.5 (ref 5.0–8.0)
Protein, ur: NEGATIVE
SQUAMOUS EPITHELIAL / LPF: NONE SEEN [HPF] (ref <=5)
Specific Gravity, Urine: 1.021 (ref 1.001–1.035)
WBC UA: NONE SEEN WBC/HPF (ref <=5)
YEAST: NONE SEEN [HPF]

## 2015-08-17 LAB — URINE CULTURE
Colony Count: NO GROWTH
Organism ID, Bacteria: NO GROWTH

## 2015-11-06 ENCOUNTER — Ambulatory Visit: Payer: 59 | Admitting: Gynecology

## 2015-11-07 ENCOUNTER — Ambulatory Visit (INDEPENDENT_AMBULATORY_CARE_PROVIDER_SITE_OTHER): Payer: 59 | Admitting: Gynecology

## 2015-11-07 ENCOUNTER — Encounter: Payer: Self-pay | Admitting: Gynecology

## 2015-11-07 VITALS — BP 120/74

## 2015-11-07 DIAGNOSIS — N951 Menopausal and female climacteric states: Secondary | ICD-10-CM

## 2015-11-07 MED ORDER — ESTRADIOL-NORETHINDRONE ACET 1-0.5 MG PO TABS: 1.0000 | ORAL_TABLET | Freq: Every day | ORAL | Status: DC

## 2015-11-07 NOTE — Patient Instructions (Addendum)
Call me if you do not feel like you had adequate relief of your menopausal symptoms or if you have any bleeding.Marland Kitchen

## 2015-11-07 NOTE — Progress Notes (Signed)
[name] 1971-03-10 [id number]        44 y.o.  [id number] Presents having stopped her oral contraceptives to see how she would do, she has remained without menses and has developed significant hot flashes and night sweats as well is vaginal dryness. Had undergone premature menopause noting [place] 75 in 2014.  Otherwise doing well without complaints.  Past medical history,surgical history, problem list, medications, allergies, family history and social history were all reviewed and documented in the EPIC chart.  Directed ROS with pertinent positives and negatives documented in the history of present illness/assessment and plan.  Exam: [name] assistant Filed Vitals:   11/07/15 0949  BP: 120/74   General appearance:  Normal Abdomen soft nontender without masses guarding rebound Pelvic external BUS vagina normal. Cervix normal. Uterus normal size midline mobile nontender. Adnexa without masses or tenderness.  Assessment/Plan:  45 y.o. [id number] with history of premature menopause stopped her oral contraceptives so she was using for menstrual regulation/symptom relief and has remained without menses but has developed significant menopausal symptoms. I reviewed options with her to include hormonal versus nonhormonal. Benefits of hormonal in premature menopause from a cardiovascular and bone health standpoint. The WHI study with increased risks of stroke heart attack DVT and breast cancer were all reviewed. After a lengthy discussion we both agree to start HRT with Activella 1/0.5 daily. She is to call me in 1-2 months if she does not feel she is getting inadequate response and she will call me regardless if she has any bleeding.  The patient also asked me about a procedure called the "O" injection which from her description is where they take a patient's blood spinach down and then reinject components in the perivaginal/periclitoral region to help rejuvenate the tissues. I reviewed with her I have  absolutely no experience/knowledge of this procedure but it does not seem to be standard of care and I doubt that they have any long-term studies. At this point I would not recommend that she pursue this.    Anastasio Auerbach MD, 10:28 AM 11/07/2015

## 2015-11-17 ENCOUNTER — Telehealth: Payer: Self-pay | Admitting: Family Medicine

## 2015-11-17 ENCOUNTER — Encounter: Payer: 59 | Admitting: Family Medicine

## 2015-11-17 NOTE — Telephone Encounter (Signed)
Patient cancel her physical appointment for 2:30pm due to patient daughter not feeling better. Charge or no charge

## 2015-11-17 NOTE — Telephone Encounter (Signed)
No charge. 

## 2015-12-02 ENCOUNTER — Other Ambulatory Visit: Payer: Self-pay | Admitting: Family Medicine

## 2015-12-02 DIAGNOSIS — F909 Attention-deficit hyperactivity disorder, unspecified type: Secondary | ICD-10-CM

## 2015-12-02 MED ORDER — AMPHETAMINE-DEXTROAMPHETAMINE 20 MG PO TABS: 20.0000 mg | ORAL_TABLET | Freq: Two times a day (BID) | ORAL | Status: DC

## 2015-12-02 NOTE — Telephone Encounter (Signed)
Called the patient informed hardcopy is ready for pickup

## 2015-12-02 NOTE — Telephone Encounter (Signed)
Requesting:  Adderall Contract  None UDS   None Last OV   05/15/2015 Last Refill   #60 with 0 refills on 01/13/2015  Please Advise

## 2015-12-02 NOTE — Telephone Encounter (Signed)
Pt called for refill on adderall. She has 4 left. Takes 1 2/day. Please calll when RX is ready for pick up at [phone].

## 2015-12-26 ENCOUNTER — Encounter: Payer: Self-pay | Admitting: Family Medicine

## 2015-12-26 ENCOUNTER — Ambulatory Visit (INDEPENDENT_AMBULATORY_CARE_PROVIDER_SITE_OTHER): Payer: 59 | Admitting: Family Medicine

## 2015-12-26 VITALS — BP 104/52 | HR 74 | Temp 98.2°F | Ht 66.0 in | Wt 123.2 lb

## 2015-12-26 DIAGNOSIS — F419 Anxiety disorder, unspecified: Secondary | ICD-10-CM

## 2015-12-26 DIAGNOSIS — N912 Amenorrhea, unspecified: Secondary | ICD-10-CM | POA: Diagnosis not present

## 2015-12-26 DIAGNOSIS — N3010 Interstitial cystitis (chronic) without hematuria: Secondary | ICD-10-CM

## 2015-12-26 DIAGNOSIS — F329 Major depressive disorder, single episode, unspecified: Secondary | ICD-10-CM

## 2015-12-26 DIAGNOSIS — K219 Gastro-esophageal reflux disease without esophagitis: Secondary | ICD-10-CM

## 2015-12-26 DIAGNOSIS — R002 Palpitations: Secondary | ICD-10-CM

## 2015-12-26 DIAGNOSIS — E559 Vitamin D deficiency, unspecified: Secondary | ICD-10-CM

## 2015-12-26 DIAGNOSIS — R790 Abnormal level of blood mineral: Secondary | ICD-10-CM | POA: Diagnosis not present

## 2015-12-26 DIAGNOSIS — F909 Attention-deficit hyperactivity disorder, unspecified type: Secondary | ICD-10-CM

## 2015-12-26 DIAGNOSIS — K589 Irritable bowel syndrome without diarrhea: Secondary | ICD-10-CM

## 2015-12-26 DIAGNOSIS — F900 Attention-deficit hyperactivity disorder, predominantly inattentive type: Secondary | ICD-10-CM

## 2015-12-26 DIAGNOSIS — F418 Other specified anxiety disorders: Secondary | ICD-10-CM

## 2015-12-26 LAB — PROGESTERONE: PROGESTERONE: 0.5 ng/mL

## 2015-12-26 LAB — FERRITIN: FERRITIN: 26.4 ng/mL (ref 10.0–291.0)

## 2015-12-26 LAB — TESTOSTERONE: Testosterone: 42.27 ng/dL — ABNORMAL HIGH (ref 15.00–40.00)

## 2015-12-26 MED ORDER — AMPHETAMINE-DEXTROAMPHETAMINE 20 MG PO TABS: 20.0000 mg | ORAL_TABLET | Freq: Two times a day (BID) | ORAL | Status: DC

## 2015-12-26 MED ORDER — POLYSACCHARIDE IRON COMPLEX 150 MG PO CAPS: 150.0000 mg | ORAL_CAPSULE | Freq: Two times a day (BID) | ORAL | Status: DC

## 2015-12-26 NOTE — Progress Notes (Signed)
Pre visit review using our clinic review tool, if applicable. No additional management support is needed unless otherwise documented below in the visit note. 

## 2015-12-26 NOTE — Patient Instructions (Signed)
[facility]   Allergies An allergy is an abnormal reaction to a substance by the body's defense system (immune system). Allergies can develop at any age. WHAT CAUSES ALLERGIES? An allergic reaction happens when the immune system mistakenly reacts to a normally harmless substance, called an allergen, as if it were harmful. The immune system releases antibodies to fight the substance. Antibodies eventually release a chemical called histamine into the bloodstream. The release of histamine is meant to protect the body from infection, but it also causes discomfort. An allergic reaction can be triggered by:  Eating an allergen.  Inhaling an allergen.  Touching an allergen. WHAT TYPES OF ALLERGIES ARE THERE? There are many types of allergies. Common types include:  Seasonal allergies. People with this type of allergy are usually allergic to substances that are only present during certain seasons, such as molds and pollens.  Food allergies.  Drug allergies.  Insect allergies.  Animal dander allergies. WHAT ARE SYMPTOMS OF ALLERGIES? Possible allergy symptoms include:  Swelling of the lips, face, tongue, mouth, or throat.  Sneezing, coughing, or wheezing.  Nasal congestion.  Tingling in the mouth.  Rash.  Itching.  Itchy, red, swollen areas of skin (hives).  Watery eyes.  Vomiting.  Diarrhea.  Dizziness.  Lightheadedness.  Fainting.  Trouble breathing or swallowing.  Chest tightness.  Rapid heartbeat. HOW ARE ALLERGIES DIAGNOSED? Allergies are diagnosed with a medical and family history and one or more of the following:  Skin tests.  Blood tests.  A food diary. A food diary is a record of all the foods and drinks you have in a day and of all the symptoms you experience.  The results of an elimination diet. An elimination diet involves eliminating foods from your diet and then adding them back in one by one to find out if a certain food causes an allergic  reaction. HOW ARE ALLERGIES TREATED? There is no cure for allergies, but allergic reactions can be treated with medicine. Severe reactions usually need to be treated at a hospital. HOW CAN REACTIONS BE PREVENTED? The best way to prevent an allergic reaction is by avoiding the substance you are allergic to. Allergy shots and medicines can also help prevent reactions in some cases. People with severe allergic reactions may be able to prevent a life-threatening reaction called anaphylaxis with a medicine given right after exposure to the allergen.   This information is not intended to replace advice given to you by your health care provider. Make sure you discuss any questions you have with your health care provider.   Document Released: 01/11/2003 Document Revised: 11/08/2014 Document Reviewed: 07/30/2014 Elsevier Interactive Patient Education [employer].

## 2015-12-29 ENCOUNTER — Ambulatory Visit: Payer: 59 | Admitting: Gynecology

## 2015-12-30 LAB — ESTROGENS, TOTAL: Estrogen: 84.1 pg/mL

## 2016-01-01 ENCOUNTER — Telehealth: Payer: Self-pay | Admitting: *Deleted

## 2016-01-01 ENCOUNTER — Ambulatory Visit (INDEPENDENT_AMBULATORY_CARE_PROVIDER_SITE_OTHER): Payer: 59 | Admitting: Gynecology

## 2016-01-01 ENCOUNTER — Encounter: Payer: Self-pay | Admitting: Gynecology

## 2016-01-01 VITALS — BP 104/60

## 2016-01-01 DIAGNOSIS — N6310 Unspecified lump in the right breast, unspecified quadrant: Secondary | ICD-10-CM

## 2016-01-01 DIAGNOSIS — N63 Unspecified lump in unspecified breast: Secondary | ICD-10-CM

## 2016-01-01 NOTE — Telephone Encounter (Signed)
-----   Message from Anastasio Auerbach, MD sent at 01/01/2016 10:39 AM EST ----- Schedule ultrasound and diagnostic mammography reference new onset right breast mass 5:00 position periphery of the areola

## 2016-01-01 NOTE — Patient Instructions (Signed)
The breast center should call you within the next week to arrange for ultrasound/mammogram. Call our office if you do not hear from them

## 2016-01-01 NOTE — Progress Notes (Signed)
[name] 1971-06-16 [id number]        45 y.o.  [id number] presents with a several week history of new right breast mass. Appreciated on self breast exam. Nontender. No nipple discharge. History of workup for 2 areas 04/2015 at different location in the right and left breast which were negative on ultrasound and mammogram.  Past medical history,surgical history, problem list, medications, allergies, family history and social history were all reviewed and documented in the EPIC chart.  Directed ROS with pertinent positives and negatives documented in the history of present illness/assessment and plan.  Exam: [name] assistant Filed Vitals:   01/01/16 1020  BP: 104/60   General appearance:  Normal Both breast examined lying and sitting with implants. Left without masses, retractions, discharge, adenopathy. Right with small less than pea-sized nodule, mobile overlying implant 5:00 position edge of areola noted. No overlying skin changes or nipple discharge. No other masses, retractions, discharge, adenopathy  Assessment/Plan:  45 y.o. [id number] with new onset small nodule right breast as described above. Plan diagnostic mammography and ultrasound of this area. Patient knows that she will here within the week to schedule. If she does not hear she will all our office. Various scenarios reviewed to include negative studies with planned observation and follow up if it changes at all, benign-appearing mass or questionable area which may result in referral to general surgery.    Anastasio Auerbach MD, 10:40 AM 01/01/2016

## 2016-01-01 NOTE — Telephone Encounter (Signed)
Orders placed at breast center, they will contact pt to schedule. 

## 2016-01-02 NOTE — Telephone Encounter (Signed)
appointment 01/07/16 @ 1:20pm

## 2016-01-04 NOTE — Assessment & Plan Note (Signed)
Continue current meds, no new concerns 

## 2016-01-04 NOTE — Assessment & Plan Note (Signed)
Still managing without meds. No new concerns still struggling with a poor marriage will let us know if any new concerns

## 2016-01-04 NOTE — Progress Notes (Signed)
Patient ID: [name], female   DOB: [birth year], 45 y.o.   MRN: [id number]   Subjective:    Patient ID: [name], female    DOB: 16-Jun-1971, 45 y.o.   MRN: [id number]  Chief Complaint  Patient presents with  . Follow-up    HPI Patient is in today for follow up. Is feeling well today. No recent illness or acute concern. She continues to struggle with a difficult marriage but no worsening of her depression. Patient acknowledges anhedonia. Denies CP/palp/SOB/HA/congestion/fevers/GI or GU c/o. Taking meds as prescribed  Past Medical History  Diagnosis Date  . GERD (gastroesophageal reflux disease)   . IBS (irritable bowel syndrome)   . Interstitial cystitis   . Poor concentration 07/02/2011  . Multiple allergies 07/02/2011  . UTI (lower urinary tract infection) 08/28/2011  . Otitis media 03/16/2012  . Shoulder pain, left 04/11/2012  . Alopecia   . Anxiety and depression 07/31/2011  . Perimenopausal 07/22/2013  . HSV-2 (herpes simplex virus 2) infection   . Chest pain 09/22/10    Sharp pains in left breast & sometimes in the right breast as well. Random in occurence & nonexertional. Also has occasional SOB & DOE when going up stairs & occasionally when working out. Nuclear Stress Treadmill 09/29/10 -   . SOB (shortness of breath) 09/22/10    ECHO 09/29/10 - NL LVF, trivial PE, trivial TR/PR.    Past Surgical History  Procedure Laterality Date  . Tonsillectomy  1982  . Rhinoplasty  2003  . Breast enhancement surgery  2007  . Cesarean section  2001 and 2004    x2  . Laparoscopy      twice  . Tubal ligation      Family History  Problem Relation Age of Onset  . Heart disease Mother     MVP  . Anxiety disorder Mother   . Mitral valve prolapse Mother   . Hypertension Brother   . Obesity Brother   . Diabetes Brother     DM, obese  . Allergies Daughter   . Allergies Son   . Kidney disease Maternal Grandmother     uremia    Social History   Social History  .  Marital Status: Married    Spouse Name: N/A  . Number of Children: N/A  . Years of Education: N/A   Occupational History  . Not on file.   Social History Main Topics  . Smoking status: Never Smoker   . Smokeless tobacco: Never Used  . Alcohol Use: 0.0 oz/week    0 Standard drinks or equivalent per week     Comment: RARELY  . Drug Use: No  . Sexual Activity: Yes    Birth Control/ Protection: Surgical     Comment: Tubal lig-1st intercourse 45 yo-More than 5 partners   Other Topics Concern  . Not on file   Social History Narrative    Outpatient Prescriptions Prior to Visit  Medication Sig Dispense Refill  . aspirin 81 MG tablet Take 81 mg by mouth daily.      . Biotin 2500 MCG CAPS Take 1 capsule by mouth daily.      . Calcium Citrate-Vitamin D 250-200 MG-UNIT TABS Take 1 tablet by mouth daily. 30 each 11  . Cholecalciferol (VITAMIN D) 2000 UNITS CAPS Take 1 capsule (2,000 Units total) by mouth once. 30 capsule 11  . fexofenadine (ALLEGRA) 180 MG tablet Take 180 mg by mouth daily.      . finasteride (PROSCAR) 5 MG  tablet Take 5 mg by mouth daily. Take 1/2 tab daily    . fish oil-omega-3 fatty acids 1000 MG capsule Take 2 g by mouth daily.     . mometasone (NASONEX) 50 MCG/ACT nasal spray Place 2 sprays into the nose daily. 17 g 6  . Multiple Vitamin (MULTIVITAMIN) capsule Take 1 capsule by mouth daily.      . nitrofurantoin, macrocrystal-monohydrate, (MACROBID) 100 MG capsule Take 1 capsule (100 mg total) by mouth once. Following intercourse 30 capsule 3  . senna (SENOKOT) 8.6 MG TABS Take 1 tablet by mouth daily.    Marland Kitchen spironolactone (ALDACTONE) 25 MG tablet Take 25 mg by mouth daily. Take 2 tabs daily    . tretinoin (RETIN-A) 0.025 % cream     . valACYclovir (VALTREX) 500 MG tablet Take 1 tablet (500 mg total) by mouth daily. 30 tablet 12  . zinc gluconate 50 MG tablet Take 50 mg by mouth daily.    Marland Kitchen amphetamine-dextroamphetamine (ADDERALL) 20 MG tablet Take 1 tablet (20 mg  total) by mouth 2 (two) times daily. May 2016 rx 60 tablet 0  . Polysaccharide Iron Complex (POLY-IRON 150 PO) Take 150 mg by mouth 2 (two) times daily.    Marland Kitchen ALPRAZolam (XANAX) 0.25 MG tablet Take 1 tablet (0.25 mg total) by mouth 2 (two) times daily as needed for anxiety. 20 tablet 1  . estradiol-norethindrone (ACTIVELLA) 1-0.5 MG tablet Take 1 tablet by mouth daily. 28 tablet 12  . Norethindrone-Ethinyl Estradiol-Fe Biphas (LO LOESTRIN FE) 1 MG-10 MCG / 10 MCG tablet Take 1 tablet by mouth daily. (Patient not taking: Reported on 11/07/2015) 3 Package 4   No facility-administered medications prior to visit.    No Known Allergies  Review of Systems  Constitutional: Negative for fever and malaise/fatigue.  HENT: Negative for congestion.   Eyes: Negative for discharge.  Respiratory: Negative for shortness of breath.   Cardiovascular: Negative for chest pain, palpitations and leg swelling.  Gastrointestinal: Negative for nausea and abdominal pain.  Genitourinary: Negative for dysuria.  Musculoskeletal: Negative for falls.  Skin: Negative for rash.  Neurological: Negative for loss of consciousness and headaches.  Endo/Heme/Allergies: Negative for environmental allergies.  Psychiatric/Behavioral: Positive for depression. The patient is not nervous/anxious.        Objective:    Physical Exam  Constitutional: She is oriented to person, place, and time. She appears well-developed and well-nourished. No distress.  HENT:  Head: Normocephalic and atraumatic.  Nose: Nose normal.  Eyes: Right eye exhibits no discharge. Left eye exhibits no discharge.  Neck: Normal range of motion. Neck supple.  Cardiovascular: Normal rate and regular rhythm.   No murmur heard. Pulmonary/Chest: Effort normal and breath sounds normal.  Abdominal: Soft. Bowel sounds are normal. There is no tenderness.  Musculoskeletal: She exhibits no edema.  Neurological: She is alert and oriented to person, place, and time.    Skin: Skin is warm and dry.  Psychiatric: She has a normal mood and affect.  Nursing note and vitals reviewed.   BP 104/52 mmHg  Pulse 74  Temp(Src) 98.2 F (36.8 C) (Oral)  Ht 5\' 6"  (1.676 m)  Wt 123 lb 4 oz (55.906 kg)  BMI 19.90 kg/m2  SpO2 100% Wt Readings from Last 3 Encounters:  12/26/15 123 lb 4 oz (55.906 kg)  07/16/15 119 lb (53.978 kg)  05/15/15 116 lb (52.617 kg)     Lab Results  Component Value Date   WBC 5.9 07/16/2015   HGB 13.0 07/16/2015  HCT 39.5 07/16/2015   PLT 210 07/16/2015   GLUCOSE 89 07/16/2015   CHOL 164 07/16/2015   TRIG 70 07/16/2015   HDL 40* 07/16/2015   LDLCALC 110 07/16/2015   ALT 11 07/16/2015   AST 14 07/16/2015   NA 139 07/16/2015   K 3.6 07/16/2015   CL 103 07/16/2015   CREATININE 0.68 07/16/2015   BUN 20 07/16/2015   CO2 27 07/16/2015   TSH 1.101 07/16/2015    Lab Results  Component Value Date   TSH 1.101 07/16/2015   Lab Results  Component Value Date   WBC 5.9 07/16/2015   HGB 13.0 07/16/2015   HCT 39.5 07/16/2015   MCV 90.4 07/16/2015   PLT 210 07/16/2015   Lab Results  Component Value Date   NA 139 07/16/2015   K 3.6 07/16/2015   CO2 27 07/16/2015   GLUCOSE 89 07/16/2015   BUN 20 07/16/2015   CREATININE 0.68 07/16/2015   BILITOT 0.9 07/16/2015   ALKPHOS 34 07/16/2015   AST 14 07/16/2015   ALT 11 07/16/2015   PROT 6.6 07/16/2015   ALBUMIN 4.4 07/16/2015   CALCIUM 9.3 07/16/2015   GFR 100.89 07/05/2014   Lab Results  Component Value Date   CHOL 164 07/16/2015   Lab Results  Component Value Date   HDL 40* 07/16/2015   Lab Results  Component Value Date   LDLCALC 110 07/16/2015   Lab Results  Component Value Date   TRIG 70 07/16/2015   Lab Results  Component Value Date   CHOLHDL 4.1 07/16/2015   No results found for: HGBA1C     Assessment & Plan:   Problem List Items Addressed This Visit    Adult ADHD    Continue current meds, no new concerns.      Anxiety and depression    Still  managing without meds. No new concerns still struggling with a poor marriage will let us know if any new concerns      GERD (gastroesophageal reflux disease)   Relevant Medications   amphetamine-dextroamphetamine (ADDERALL) 20 MG tablet   IBS (irritable bowel syndrome)   Relevant Medications   amphetamine-dextroamphetamine (ADDERALL) 20 MG tablet   Interstitial cystitis    No recent flares, follow with urology      Relevant Medications   amphetamine-dextroamphetamine (ADDERALL) 20 MG tablet   Palpitations   Relevant Medications   amphetamine-dextroamphetamine (ADDERALL) 20 MG tablet   Vitamin D deficiency   Relevant Medications   amphetamine-dextroamphetamine (ADDERALL) 20 MG tablet    Other Visit Diagnoses    Attention deficit hyperactivity disorder (ADHD), unspecified ADHD type    -  Primary    Relevant Medications    amphetamine-dextroamphetamine (ADDERALL) 20 MG tablet    Amenorrhea        Relevant Orders    Estrogens, Total (Completed)    Progesterone (Completed)    Testosterone (Completed)    Low ferritin level        Relevant Orders    Ferritin (Completed)       I have discontinued Ms. Bingaman's ALPRAZolam, Norethindrone-Ethinyl Estradiol-Fe Biphas, and estradiol-norethindrone. I have changed her Polysaccharide Iron Complex (POLY-IRON 150 PO) to iron polysaccharides (POLY-IRON 150) 150 MG capsule. I have also changed her amphetamine-dextroamphetamine. Additionally, I am having her maintain her fish oil-omega-3 fatty acids, multivitamin, aspirin, fexofenadine, Calcium Citrate-Vitamin D, Vitamin D, Biotin, tretinoin, spironolactone, zinc gluconate, senna, finasteride, mometasone, valACYclovir, and nitrofurantoin (macrocrystal-monohydrate).  Meds ordered this encounter  Medications  . iron polysaccharides (POLY-IRON  150) 150 MG capsule    Sig: Take 1 capsule (150 mg total) by mouth 2 (two) times daily.    Dispense:  60 capsule    Refill:  6  .  amphetamine-dextroamphetamine (ADDERALL) 20 MG tablet    Sig: Take 1 tablet (20 mg total) by mouth 2 (two) times daily. March 2017    Dispense:  60 tablet    Refill:  0  . DISCONTD: amphetamine-dextroamphetamine (ADDERALL) 20 MG tablet    Sig: Take 1 tablet (20 mg total) by mouth 2 (two) times daily. April 2017    Dispense:  60 tablet    Refill:  0  . DISCONTD: amphetamine-dextroamphetamine (ADDERALL) 20 MG tablet    Sig: Take 1 tablet (20 mg total) by mouth 2 (two) times daily. May 2017    Dispense:  60 tablet    Refill:  0     Penni Homans, MD

## 2016-01-04 NOTE — Assessment & Plan Note (Signed)
No recent flares, follow with urology

## 2016-01-07 ENCOUNTER — Other Ambulatory Visit: Payer: 59

## 2016-01-08 ENCOUNTER — Telehealth: Payer: Self-pay

## 2016-01-08 NOTE — Telephone Encounter (Signed)
Patient said she is taking generic Activella and one of the side affects is hairloss. She said she is already having issue with thinning hair and wondered if you could change the Rx.

## 2016-01-08 NOTE — Telephone Encounter (Signed)
Tell patient there are a variety of different options available. Recommend trial of estradiol 0.5 mg daily and Prometrium 100 mg nightly. 30 day supply for both with refill times next annual exam.   Call if issues with this.  Tell her also that I am still looking into the O - shot and will get back to her on this.

## 2016-01-09 MED ORDER — ESTRADIOL 0.5 MG PO TABS: 0.5000 mg | ORAL_TABLET | Freq: Every day | ORAL | Status: DC

## 2016-01-09 MED ORDER — PROGESTERONE MICRONIZED 100 MG PO CAPS: 100.0000 mg | ORAL_CAPSULE | Freq: Every day | ORAL | Status: DC

## 2016-01-09 NOTE — Telephone Encounter (Signed)
Patient informed. Rx's sent. 

## 2016-01-14 ENCOUNTER — Other Ambulatory Visit: Payer: 59

## 2016-01-21 ENCOUNTER — Telehealth: Payer: Self-pay | Admitting: *Deleted

## 2016-01-21 ENCOUNTER — Ambulatory Visit
Admission: RE | Admit: 2016-01-21 | Discharge: 2016-01-21 | Disposition: A | Discharge status: Home / Self Care | Payer: 59 | Source: Ambulatory Visit | Attending: Gynecology | Admitting: Gynecology

## 2016-01-21 ENCOUNTER — Other Ambulatory Visit: Payer: Self-pay | Admitting: Gynecology

## 2016-01-21 ENCOUNTER — Encounter: Payer: Self-pay | Admitting: *Deleted

## 2016-01-21 DIAGNOSIS — N63 Unspecified lump in unspecified breast: Secondary | ICD-10-CM

## 2016-01-21 DIAGNOSIS — N6310 Unspecified lump in the right breast, unspecified quadrant: Secondary | ICD-10-CM

## 2016-01-21 NOTE — Telephone Encounter (Signed)
I left message for pt to call. I will also send the below message via my chart as well.

## 2016-01-21 NOTE — Telephone Encounter (Signed)
-----   Message from Anastasio Auerbach, MD sent at 01/21/2016 12:36 PM EDT ----- The radiologist had recommended a breast MRI with and without contrast because of a palpable area in the right breast. Check with patient to see if they have schedule this. If not the we can go ahead and schedule this if she wants to pursue this.

## 2016-01-21 NOTE — Telephone Encounter (Signed)
I called and spoke with Vikki at [facility] and she is going to call the patient to schedule.

## 2016-01-21 NOTE — Telephone Encounter (Signed)
Pt informed with the below note. 

## 2016-01-21 NOTE — Telephone Encounter (Signed)
-----   Message from Anastasio Auerbach, MD sent at 01/21/2016  9:08 AM EDT ----- Tell patient I did not forget about the question about the O shot.  I have done a fair amount of reading and the bottom line is I am unaware of any medical studies to support the efficacy of this treatment. What would be needed is prospective, randomized studies with some form of placebo to really study if it works or not. Right now it is mostly patient testimonials. Other than developing an infection at the injection site or possible scarring from the injection that could lead to problems, it appears to be a relatively safe procedure but again I have no experience with this.

## 2016-01-22 NOTE — Telephone Encounter (Signed)
MRI breast scheduled on 01/28/16 @ 7:50am at [facility]

## 2016-01-27 ENCOUNTER — Telehealth: Payer: Self-pay

## 2016-01-27 NOTE — Telephone Encounter (Signed)
Patient called me back. I explained situation. She will call and reschedule MRI for next week.

## 2016-01-27 NOTE — Telephone Encounter (Signed)
I called and left detailed message for patient and also asked her to call me and let me know she received my message. I called [employer] today and was told her MRI of BReast scheduled for tomorrow morning is still "currently in review".  The RN today told me it might be next Monday before we get final response.  I called patient and relayed that to her in a voice mail message. I told her she should reschedule her MRI appt for tomorrow until next week in order to receive approval and have [facility] cover her MRI.  I called [place] and let them know and asked her to reach out to patient and try to get her rescheduled.

## 2016-01-28 ENCOUNTER — Inpatient Hospital Stay: Admission: RE | Admit: 2016-01-28 | Payer: 59 | Source: Ambulatory Visit

## 2016-02-03 ENCOUNTER — Encounter: Payer: Self-pay | Admitting: Gynecology

## 2016-02-04 ENCOUNTER — Other Ambulatory Visit: Payer: 59

## 2016-02-16 ENCOUNTER — Telehealth: Payer: Self-pay

## 2016-02-16 NOTE — Telephone Encounter (Signed)
Patient called to inquire about recent appeal letter that I faxed to [facility] on 02/03/16.  I have not heard from them. I called and spoke with rep who confirmed it was received on 02/03/16 and was sent to "the Reconsideration Department where it is still under review".

## 2016-02-17 NOTE — Telephone Encounter (Signed)
Left patient a detailed message per DPR access note.

## 2016-02-24 ENCOUNTER — Telehealth: Payer: Self-pay

## 2016-02-24 NOTE — Telephone Encounter (Signed)
Patient called back after we discussed [employer] denying her MRI after appeal. She said her husband would like to have a conversation with you. He has some questions and would like to understand more about" why the ins has denied it, what specifically you wrote in the letter and a whole lot of other questions".  She said there is some urgency to this telephone call between you and her husband because they are trying to decide whether to keep [facility] or change ins as the re-enrollment date on their ins plan is 03/01/16.   Husband is [employer] education officer) his cell phone is [phone]  Office [phone].  She also wants a copy of your letter and I told her with signed release form I can provide that. I think she is going to stop by this afternoon and sign for that and get a copy.

## 2016-02-24 NOTE — Telephone Encounter (Signed)
I contacted patient because I spoke with [employer] just now and was informed regarding her breast MRI appeal that "a decision was rendered today and it is still not approved. The medical director upheld the original decision."  I told the patient that the rep said the next step we could do it send letter of medical necessity and office notes to [facility] which "Is her specific health plan". Patient would like me to do this and I will fax that this afternoon.

## 2016-02-24 NOTE — Telephone Encounter (Signed)
error 

## 2016-02-24 NOTE — Telephone Encounter (Signed)
I called the husband in follow up as requested and we discussed the patient's situation. New-onset palpable area in the breast not visualized on mammogram or ultrasound. Also felt by the radiologist who recommended MRI which was denied through appeals to the insurance company. I read him the letter I generated. We are now starting the second level of appeal which is through the local writers of the plan as was recommended per their protocol. I also discussed with the husband and my recommendation that we have a general surgeon also examine her and be involved in the situation because regardless of the MRI results the question is whether this area should be excised or sampled in some way. He agrees with this and we will go ahead and make this arrangement also.

## 2016-02-27 ENCOUNTER — Telehealth: Payer: Self-pay | Admitting: *Deleted

## 2016-02-27 NOTE — Telephone Encounter (Signed)
-----   Message from Ramond Craver, [place] sent at 02/24/2016  4:52 PM EDT ----- Regarding: referral to gen surg Dr. Loetta Rough wants her referred to general surgeon regarding her breast lump. (Just FYI the MRI was denied again in appeal with [facility]!) Dr. Loetta Rough gave me this verbally and mentioned [facility] and [facility].  Thanks!!

## 2016-02-27 NOTE — Telephone Encounter (Signed)
Appt. On 03/02/16 @ 1:30pm with Dr.Rosenbower left on voicemail

## 2016-02-27 NOTE — Telephone Encounter (Signed)
Referral faxed to [facility] they will fax me back with time and date to relay to to pt.

## 2016-03-03 ENCOUNTER — Other Ambulatory Visit: Payer: Self-pay | Admitting: General Surgery

## 2016-03-03 ENCOUNTER — Ambulatory Visit: Payer: Self-pay | Admitting: General Surgery

## 2016-03-03 DIAGNOSIS — N6310 Unspecified lump in the right breast, unspecified quadrant: Secondary | ICD-10-CM

## 2016-03-03 NOTE — H&P (Signed)
[name] 03/02/2016 2:01 PM Location: [facility] Patient #: [id number] DOB: 12/13/1970 Married / Language: English / Race: Refused to Report/Unreported Female   History of Present Illness Odis Hollingshead MD; 03/02/2016 5:19 PM) The patient is a 45 year old female.  Note:She is referred by Dr. Phineas Real because of a palpable right breast mass, 4:30 position at the border of the nipple-areolar complex. She discovered this on a breast self-examination in February of this year. Imaging studies, mammogram and [place], did not demonstrate a lesion. Insurance company denied an MRI. She has had subpectoral implants placed in the past. Other than this, she denies any trauma to the breast or nipple discharge. She does have a major risk factor for breast cancer in that her first child was born after the age of 45. She is here with her husband.  Medication History Jeralyn Ruths, CMA; 03/02/2016 2:21 PM) Estrace (0.5MG  Tablet, Oral daily) Active. Progesterone (100MG  Capsule, Oral daily) Active. Aspirin (81MG  Tablet, Oral daily) Active. Biotin (2500MCG Capsule, Oral daily) Active. Fexofenadine HCl (180MG  Tablet, Oral daily) Active. Finasteride (5MG  Tablet, 1/2 tablet Oral daily) Active. Multivitamin (Oral daily) Active. Senokot (8.6MG  Tablet, Oral daily) Active. Spironolactone (25MG  Tablet, two tablets Oral daily) Active. Zinc Gluconate (50MG  Tablet, Oral daily) Active. Amphetamine-Dextroamphet ER (20MG  Capsule ER 24HR, 2 capsules Oral daily) Active. Iron Polysacch Cmplx-B12-FA (150-0.025-1MG  Capsule, Oral daily) Active. Nitrofurantoin Macrocrystal (100MG  Capsule, Oral daily) Active. ValACYclovir HCl (500MG  Tablet, Oral as directed) Active. Mometasone Furoate (200MCG/ACT Aerosol, 2 sprays Inhalation daily) Active.    Physical Exam Odis Hollingshead MD; 03/02/2016 5:21 PM) The physical exam findings are as follows: Note:General: WDWN in NAD. Pleasant and  cooperative.  HEENT: [place]/AT, no facial masses  EYES: EOMI, no icterus  NECK: Supple, no obvious mass.  CV: RRR, no murmur, no JVD.  CHEST: Breath sounds equal and clear. Respirations nonlabored.  BREASTS: Symmetrical in size. 8-10 mm smooth fix mass right breast areolar area at 4:30 position. No other masses felt in either breast.  LYMPHATIC: No palpable cervical, supraclavicular, axillary adenopathy.  SKIN: No jaundice.  NEUROLOGIC: Alert and oriented, answers questions appropriately.  PSYCHIATRIC: Normal mood, affect , and behavior.    Assessment & Plan Odis Hollingshead MD; 03/02/2016 5:22 PM) BREAST MASS, RIGHT (N63) Impression: There is discordance between the imaging studies which don't notice anything versus the physical exam. More information is needed. MRI would be helpful but if that denied again, I recommended removal of the mass. She does have a major risk factor for breast cancer in that age at first childbirth was over 22.  She has decided to proceed with removal. Plan:  Removal of the mass as an outpatient. I have explained the procedure, risks, and aftercare to her. Risks include but are not limited to bleeding, infection, wound problems, seroma formation, cosmetic deformity, anesthesia. She seems to [facility] and agrees with the plan.  Jackolyn Confer, MD

## 2016-03-09 ENCOUNTER — Other Ambulatory Visit: Payer: 59

## 2016-03-10 ENCOUNTER — Encounter (HOSPITAL_BASED_OUTPATIENT_CLINIC_OR_DEPARTMENT_OTHER): Payer: Self-pay | Admitting: *Deleted

## 2016-03-10 NOTE — Pre-Procedure Instructions (Signed)
Pt given Boost ( carb drink) and instructed to drink before 0830 on day of surgery, 2 hours prior to arrival.

## 2016-03-12 ENCOUNTER — Ambulatory Visit (HOSPITAL_BASED_OUTPATIENT_CLINIC_OR_DEPARTMENT_OTHER): Payer: 59 | Admitting: Anesthesiology

## 2016-03-12 ENCOUNTER — Encounter (HOSPITAL_BASED_OUTPATIENT_CLINIC_OR_DEPARTMENT_OTHER): Payer: Self-pay | Admitting: Anesthesiology

## 2016-03-12 ENCOUNTER — Ambulatory Visit (HOSPITAL_BASED_OUTPATIENT_CLINIC_OR_DEPARTMENT_OTHER)
Admission: RE | Admit: 2016-03-12 | Discharge: 2016-03-12 | Disposition: A | Discharge status: Home / Self Care | Payer: 59 | Source: Ambulatory Visit | Attending: General Surgery | Admitting: General Surgery

## 2016-03-12 ENCOUNTER — Encounter (HOSPITAL_BASED_OUTPATIENT_CLINIC_OR_DEPARTMENT_OTHER)
Admission: RE | Disposition: A | Discharge status: Home / Self Care | Payer: Self-pay | Source: Ambulatory Visit | Attending: General Surgery

## 2016-03-12 DIAGNOSIS — T85898A Other specified complication of other internal prosthetic devices, implants and grafts, initial encounter: Secondary | ICD-10-CM | POA: Insufficient documentation

## 2016-03-12 DIAGNOSIS — Z7982 Long term (current) use of aspirin: Secondary | ICD-10-CM | POA: Diagnosis not present

## 2016-03-12 DIAGNOSIS — Z9882 Breast implant status: Secondary | ICD-10-CM | POA: Insufficient documentation

## 2016-03-12 DIAGNOSIS — Z79890 Hormone replacement therapy (postmenopausal): Secondary | ICD-10-CM | POA: Insufficient documentation

## 2016-03-12 DIAGNOSIS — F909 Attention-deficit hyperactivity disorder, unspecified type: Secondary | ICD-10-CM | POA: Diagnosis not present

## 2016-03-12 DIAGNOSIS — Z79899 Other long term (current) drug therapy: Secondary | ICD-10-CM | POA: Diagnosis not present

## 2016-03-12 DIAGNOSIS — Y838 Other surgical procedures as the cause of abnormal reaction of the patient, or of later complication, without mention of misadventure at the time of the procedure: Secondary | ICD-10-CM | POA: Insufficient documentation

## 2016-03-12 DIAGNOSIS — N63 Unspecified lump in breast: Secondary | ICD-10-CM | POA: Diagnosis present

## 2016-03-12 HISTORY — PX: BREAST BIOPSY: SHX20

## 2016-03-12 HISTORY — DX: Other specified postprocedural states: Z98.890

## 2016-03-12 HISTORY — DX: Other specified postprocedural states: R11.2

## 2016-03-12 SURGERY — BREAST BIOPSY
Anesthesia: General | Site: Breast | Laterality: Right

## 2016-03-12 MED ORDER — FENTANYL CITRATE (PF) 100 MCG/2ML IJ SOLN
50.0000 ug | INTRAMUSCULAR | Status: DC | PRN
  Administered 2016-03-12: 100 ug via INTRAVENOUS

## 2016-03-12 MED ORDER — ONDANSETRON HCL 4 MG/2ML IJ SOLN: 4.0000 mg | Freq: Once | INTRAMUSCULAR | Status: DC | PRN

## 2016-03-12 MED ORDER — MIDAZOLAM HCL 2 MG/2ML IJ SOLN
1.0000 mg | INTRAMUSCULAR | Status: DC | PRN
  Administered 2016-03-12: 2 mg via INTRAVENOUS

## 2016-03-12 MED ORDER — LIDOCAINE HCL (CARDIAC) 20 MG/ML IV SOLN
INTRAVENOUS | Status: DC | PRN
  Administered 2016-03-12: 30 mg via INTRAVENOUS

## 2016-03-12 MED ORDER — HYDROMORPHONE HCL 1 MG/ML IJ SOLN: 0.5000 mg | INTRAMUSCULAR | Status: DC | PRN

## 2016-03-12 MED ORDER — MIDAZOLAM HCL 2 MG/2ML IJ SOLN
INTRAMUSCULAR | Status: AC
  Filled 2016-03-12: qty 2

## 2016-03-12 MED ORDER — DEXAMETHASONE SODIUM PHOSPHATE 4 MG/ML IJ SOLN
INTRAMUSCULAR | Status: DC | PRN
  Administered 2016-03-12: 10 mg via INTRAVENOUS

## 2016-03-12 MED ORDER — HYDROMORPHONE HCL 1 MG/ML IJ SOLN
INTRAMUSCULAR | Status: AC
  Filled 2016-03-12: qty 1

## 2016-03-12 MED ORDER — FLUCONAZOLE 200 MG PO TABS: 200.0000 mg | ORAL_TABLET | Freq: Every day | ORAL | Status: DC

## 2016-03-12 MED ORDER — PROPOFOL 10 MG/ML IV BOLUS
INTRAVENOUS | Status: DC | PRN
  Administered 2016-03-12: 200 mg via INTRAVENOUS

## 2016-03-12 MED ORDER — SCOPOLAMINE 1 MG/3DAYS TD PT72
1.0000 | MEDICATED_PATCH | Freq: Once | TRANSDERMAL | Status: DC | PRN
  Administered 2016-03-12: 1.5 mg via TRANSDERMAL

## 2016-03-12 MED ORDER — ONDANSETRON HCL 4 MG/2ML IJ SOLN
INTRAMUSCULAR | Status: DC | PRN
  Administered 2016-03-12: 4 mg via INTRAVENOUS

## 2016-03-12 MED ORDER — HYDROMORPHONE HCL 1 MG/ML IJ SOLN
0.5000 mg | INTRAMUSCULAR | Status: DC | PRN
  Administered 2016-03-12 (×2): 0.5 mg via INTRAVENOUS

## 2016-03-12 MED ORDER — BUPIVACAINE HCL (PF) 0.25 % IJ SOLN
INTRAMUSCULAR | Status: AC
  Filled 2016-03-12: qty 30

## 2016-03-12 MED ORDER — GLYCOPYRROLATE 0.2 MG/ML IJ SOLN: 0.2000 mg | Freq: Once | INTRAMUSCULAR | Status: DC | PRN

## 2016-03-12 MED ORDER — HYDROCODONE-ACETAMINOPHEN 5-325 MG PO TABS: 1.0000 | ORAL_TABLET | ORAL | Status: DC | PRN

## 2016-03-12 MED ORDER — FENTANYL CITRATE (PF) 100 MCG/2ML IJ SOLN
INTRAMUSCULAR | Status: AC
  Filled 2016-03-12: qty 2

## 2016-03-12 MED ORDER — CEFAZOLIN SODIUM-DEXTROSE 2-4 GM/100ML-% IV SOLN
INTRAVENOUS | Status: AC
  Filled 2016-03-12: qty 100

## 2016-03-12 MED ORDER — BUPIVACAINE HCL (PF) 0.5 % IJ SOLN
INTRAMUSCULAR | Status: DC | PRN
  Administered 2016-03-12: 2 mL

## 2016-03-12 MED ORDER — SCOPOLAMINE 1 MG/3DAYS TD PT72
MEDICATED_PATCH | TRANSDERMAL | Status: AC
  Filled 2016-03-12: qty 1

## 2016-03-12 MED ORDER — BUPIVACAINE HCL (PF) 0.5 % IJ SOLN
INTRAMUSCULAR | Status: AC
  Filled 2016-03-12: qty 30

## 2016-03-12 MED ORDER — OXYCODONE HCL 5 MG PO TABS
5.0000 mg | ORAL_TABLET | Freq: Once | ORAL | Status: AC
  Administered 2016-03-12: 5 mg via ORAL

## 2016-03-12 MED ORDER — CEFAZOLIN SODIUM-DEXTROSE 2-4 GM/100ML-% IV SOLN
2.0000 g | INTRAVENOUS | Status: AC
  Administered 2016-03-12: 2 g via INTRAVENOUS

## 2016-03-12 MED ORDER — OXYCODONE HCL 5 MG PO TABS
ORAL_TABLET | ORAL | Status: AC
  Filled 2016-03-12: qty 1

## 2016-03-12 MED ORDER — LACTATED RINGERS IV SOLN
INTRAVENOUS | Status: DC
  Administered 2016-03-12: 11:00:00 via INTRAVENOUS

## 2016-03-12 SURGICAL SUPPLY — 39 items
APL SKNCLS STERI-STRIP NONHPOA (GAUZE/BANDAGES/DRESSINGS) ×1
BENZOIN TINCTURE PRP APPL 2/3 (GAUZE/BANDAGES/DRESSINGS) ×3 IMPLANT
BLADE SURG 15 STRL LF DISP TIS (BLADE) ×1 IMPLANT
BLADE SURG 15 STRL SS (BLADE) ×3
CANISTER SUCT 1200ML W/VALVE (MISCELLANEOUS) IMPLANT
CHLORAPREP W/TINT 26ML (MISCELLANEOUS) ×3 IMPLANT
CLOSURE WOUND 1/2 X4 (GAUZE/BANDAGES/DRESSINGS) ×1
COVER BACK TABLE 60X90IN (DRAPES) ×3 IMPLANT
COVER MAYO STAND STRL (DRAPES) ×3 IMPLANT
DECANTER SPIKE VIAL GLASS SM (MISCELLANEOUS) ×3 IMPLANT
DEVICE DUBIN W/COMP PLATE 8390 (MISCELLANEOUS) IMPLANT
DRAPE LAPAROTOMY 100X72 PEDS (DRAPES) ×3 IMPLANT
DRAPE UTILITY XL STRL (DRAPES) ×3 IMPLANT
ELECT COATED BLADE 2.86 ST (ELECTRODE) ×3 IMPLANT
ELECT REM PT RETURN 9FT ADLT (ELECTROSURGICAL) ×3
ELECTRODE REM PT RTRN 9FT ADLT (ELECTROSURGICAL) ×1 IMPLANT
GAUZE SPONGE 4X4 12PLY STRL (GAUZE/BANDAGES/DRESSINGS) ×3 IMPLANT
GLOVE BIOGEL PI IND STRL 8.5 (GLOVE) ×1 IMPLANT
GLOVE BIOGEL PI INDICATOR 8.5 (GLOVE) ×2
GLOVE ECLIPSE 8.0 STRL XLNG CF (GLOVE) ×3 IMPLANT
GOWN STRL REUS W/ TWL LRG LVL3 (GOWN DISPOSABLE) ×1 IMPLANT
GOWN STRL REUS W/TWL LRG LVL3 (GOWN DISPOSABLE) ×3
NDL HYPO 25X1 1.5 SAFETY (NEEDLE) ×1 IMPLANT
NEEDLE HYPO 25X1 1.5 SAFETY (NEEDLE) ×3 IMPLANT
NS IRRIG 1000ML POUR BTL (IV SOLUTION) ×3 IMPLANT
PACK BASIN DAY SURGERY FS (CUSTOM PROCEDURE TRAY) ×3 IMPLANT
PENCIL BUTTON HOLSTER BLD 10FT (ELECTRODE) ×3 IMPLANT
SLEEVE SCD COMPRESS KNEE MED (MISCELLANEOUS) IMPLANT
SPONGE GAUZE 4X4 12PLY STER LF (GAUZE/BANDAGES/DRESSINGS) ×3 IMPLANT
STRIP CLOSURE SKIN 1/2X4 (GAUZE/BANDAGES/DRESSINGS) ×2 IMPLANT
SUT MON AB 4-0 PC3 18 (SUTURE) ×3 IMPLANT
SUT SILK 2 0 FS (SUTURE) ×3 IMPLANT
SUT VICRYL 3-0 CR8 SH (SUTURE) ×3 IMPLANT
SYR CONTROL 10ML LL (SYRINGE) ×3 IMPLANT
TOWEL OR 17X24 6PK STRL BLUE (TOWEL DISPOSABLE) ×6 IMPLANT
TOWEL OR NON WOVEN STRL DISP B (DISPOSABLE) ×3 IMPLANT
TUBE CONNECTING 20'X1/4 (TUBING)
TUBE CONNECTING 20X1/4 (TUBING) IMPLANT
YANKAUER SUCT BULB TIP NO VENT (SUCTIONS) IMPLANT

## 2016-03-12 NOTE — Transfer of Care (Signed)
Immediate Anesthesia Transfer of Care Note  Patient: [name]  Procedure(s) Performed: Procedure(s): breast exploration (Right)  Patient Location: PACU  Anesthesia Type:General  Level of Consciousness: awake, alert  and oriented  Airway & Oxygen Therapy: Patient Spontanous Breathing and Patient connected to face mask oxygen  Post-op Assessment: Report given to RN and Post -op Vital signs reviewed and stable  Post vital signs: Reviewed and stable  Last Vitals:  Filed Vitals:   03/12/16 1323 03/12/16 1324  BP:    Pulse:    Temp:    Resp: 42 14    Last Pain: There were no vitals filed for this visit.       Complications: No apparent anesthesia complications

## 2016-03-12 NOTE — Op Note (Addendum)
Operative Note  [name] female 45 y.o. 03/12/2016  PREOPERATIVE DX:  Right breast mass  POSTOPERATIVE DX:  Right breast implant irregularity  PROCEDURE:   Right breast exploration         Surgeon: Odis Hollingshead   Assistants: None  Anesthesia: General/LMA  Indications:   This is a 45 year old female with bilateral breast implants who is noted to have a palpable firm mass at the 4:30 position of the right breast and the areolar area. Imaging studies were negative but the mass was definitely palpable. Her insurance company would not pay for an MRI.  After discussion with options, she is elected to have the mass removed and presents for that.    Procedure Detail:  She was seen in the holding area and my initials were placed on her right breast.  She is brought to the operating room and placed supine on the operating table and a general anesthetic was given. The right breast was sterilely prepped and draped. A timeout was performed.  The firm nodule was palpable at the 4:30 position just inferior to the nipple areolar complex interface. Local anesthetic was infiltrated and a circumareolar real incision was made from the 3 to 7:00 position in the subcutaneous tissue divided sharply. Then using electrocautery to divide some more subcutaneous tissue. I was able to palpate the mass. Then continued to carefully dissect through the subcutaneous tissue and approached the muscle and felt that the mass was under the muscle. I carefully separated the pectoral muscle fibers and noted that the firm mass measuring 8-10 mm was a irregularity of the implant. I did not disrupt the implant. I subsequently irrigated out this area then reapproximated the muscle over the implant with interrupted 3-0 Vicryl sutures. The subcutaneous tissues approximated with 3-0 Vicryl sutures in interrupted fashion. The skin was closed with a running 4-0 Monocryl subcuticular stitch. Steri-Strips, sterile dressing, and a  breast binder were applied.  She tolerated the procedure well without any apparent complications. There was minimal blood loss present. She was taken to the recovery room in satisfactory condition.

## 2016-03-12 NOTE — Interval H&P Note (Signed)
History and Physical Interval Note:  03/12/2016 12:17 PM  [name]  has presented today for surgery, with the diagnosis of right breast mass  The various methods of treatment have been discussed with the patient and family. After consideration of risks, benefits and other options for treatment, the patient has consented to  Procedure(s): REMOVE RIGHT BREAST  MASS (Right) as a surgical intervention .  The patient's history has been reviewed, patient examined, no change in status, stable for surgery.  I have reviewed the patient's chart and labs.  Questions were answered to the patient's satisfaction.     Lorilee Cafarella Lenna Sciara

## 2016-03-12 NOTE — Anesthesia Procedure Notes (Signed)
Procedure Name: LMA Insertion Date/Time: 03/12/2016 12:34 PM Performed by: Melynda Ripple D Pre-anesthesia Checklist: Patient identified, Emergency Drugs available, Suction available and Patient being monitored Patient Re-evaluated:Patient Re-evaluated prior to inductionOxygen Delivery Method: Circle System Utilized Preoxygenation: Pre-oxygenation with 100% oxygen Intubation Type: IV induction Ventilation: Mask ventilation without difficulty LMA: LMA inserted LMA Size: 4.0 Number of attempts: 1 Airway Equipment and Method: Bite block Placement Confirmation: positive ETCO2 Tube secured with: Tape Dental Injury: Teeth and Oropharynx as per pre-operative assessment

## 2016-03-12 NOTE — Anesthesia Postprocedure Evaluation (Signed)
Anesthesia Post Note  Patient: [name]  Procedure(s) Performed: Procedure(s) (LRB): breast exploration (Right)  Patient location during evaluation: PACU Anesthesia Type: General Level of consciousness: awake, awake and alert, oriented and patient cooperative Pain management: pain level controlled Vital Signs Assessment: post-procedure vital signs reviewed and stable Respiratory status: spontaneous breathing and respiratory function stable Cardiovascular status: stable Anesthetic complications: no    Last Vitals:  Filed Vitals:   03/12/16 1345 03/12/16 1400  BP: 101/56 97/51  Pulse: 69 72  Temp:    Resp: 10 13    Last Pain: There were no vitals filed for this visit.               Isabell Bonafede EDWARD

## 2016-03-12 NOTE — Discharge Instructions (Addendum)
[place] Office Phone Number [phone]  BREAST BIOPSY/ PARTIAL MASTECTOMY: POST OP INSTRUCTIONS  Always review your discharge instruction sheet given to you by the facility where your surgery was performed.  IF YOU HAVE DISABILITY OR FAMILY LEAVE FORMS, YOU MUST BRING THEM TO THE OFFICE FOR PROCESSING.  DO NOT GIVE THEM TO YOUR DOCTOR.  1. A prescription for pain medication may be given to you upon discharge.  Take your pain medication as prescribed, if needed.  If narcotic pain medicine is not needed, then you may take acetaminophen (Tylenol) or ibuprofen (Advil) as needed. 2. Take your usually prescribed medications unless otherwise directed 3. If you need a refill on your pain medication, please contact your pharmacy.  They will contact our office to request authorization.  Prescriptions will not be filled after 5pm or on week-ends. 4. You should eat very light the first 24 hours after surgery, such as soup, crackers, pudding, etc.  Resume your normal diet the day after surgery. 5. Most patients will experience some swelling and bruising in the breast.  Ice packs and a good support bra will help.  Swelling and bruising can take several days to resolve.  6. It is common to experience some constipation if taking pain medication after surgery.  Increasing fluid intake and taking a stool softener will usually help or prevent this problem from occurring.  A mild laxative (Milk of Magnesia or Miralax) should be taken according to package directions if there are no bowel movements after 48 hours. 7. Unless discharge instructions indicate otherwise, you may remove your bandages 48 hours after surgery, and you may shower at that time.  You may have steri-strips (small skin tapes) in place directly over the incision.  These strips should be left on the skin.  If your surgeon used skin glue on the incision, you may shower in 24 hours.  The glue will flake off over the next 2-3 weeks.   Any sutures or staples will be removed at the office during your follow-up visit. 8. ACTIVITIES:  You may resume light daily activities (gradually increasing) beginning the next day.  Resume all normal activities when you are pain-free, this is usually 3-5 days.  Wearing a good support bra or sports bra minimizes pain and swelling.  You may have sexual intercourse when it is comfortable. a. You may drive when you no longer are taking prescription pain medication, you can comfortably wear a seatbelt, and you can safely maneuver your car and apply brakes. b. RETURN TO WORK:  3-5 days.______________________________________________________________________________________ 9. You should see your doctor in the office for a follow-up appointment approximately two weeks after your surgery.  Your doctors nurse will typically make your follow-up appointment when she calls you with your pathology report.  Expect your pathology report 2-3 business days after your surgery.  You may call to check if you do not hear from [place] after three days. 10. OTHER INSTRUCTIONS: _______________________________________________________________________________________________ _____________________________________________________________________________________________________________________________________ _____________________________________________________________________________________________________________________________________ _____________________________________________________________________________________________________________________________________  WHEN TO CALL YOUR DOCTOR: 1. Fever over 101.0 2. Nausea and/or vomiting. 3. Extreme swelling or bruising. 4. Continued bleeding from incision. 5. Increased pain, redness, or drainage from the incision.  The clinic staff is available to answer your questions during regular business hours.  Please dont hesitate to call and ask to speak to one of the nurses for  clinical concerns.  If you have a medical emergency, go to the nearest emergency room or call 911.  A surgeon from [facility] is always on  call at the hospital.  For further questions, please visit [email or web address]      Post Anesthesia Home Care Instructions  Activity: Get plenty of rest for the remainder of the day. A responsible adult should stay with you for 24 hours following the procedure.  For the next 24 hours, DO NOT: -Drive a car -Paediatric nurse -Drink alcoholic beverages -Take any medication unless instructed by your physician -Make any legal decisions or sign important papers.  Meals: Start with liquid foods such as gelatin or soup. Progress to regular foods as tolerated. Avoid greasy, spicy, heavy foods. If nausea and/or vomiting occur, drink only clear liquids until the nausea and/or vomiting subsides. Call your physician if vomiting continues.  Special Instructions/Symptoms: Your throat may feel dry or sore from the anesthesia or the breathing tube placed in your throat during surgery. If this causes discomfort, gargle with warm salt water. The discomfort should disappear within 24 hours.  If you had a scopolamine patch placed behind your ear for the management of post- operative nausea and/or vomiting:  1. The medication in the patch is effective for 72 hours, after which it should be removed.  Wrap patch in a tissue and discard in the trash. Wash hands thoroughly with soap and water. 2. You may remove the patch earlier than 72 hours if you experience unpleasant side effects which may include dry mouth, dizziness or visual disturbances. 3. Avoid touching the patch. Wash your hands with soap and water after contact with the patch.

## 2016-03-12 NOTE — Anesthesia Preprocedure Evaluation (Addendum)
Anesthesia Evaluation  Patient identified by MRN, date of birth, ID band Patient awake    Reviewed: Allergy & Precautions, NPO status , Patient's Chart, lab work & pertinent test results  History of Anesthesia Complications (+) PONV  Airway Mallampati: I  TM Distance: >3 FB Neck ROM: Full    Dental   Pulmonary    Pulmonary exam normal        Cardiovascular Normal cardiovascular exam     Neuro/Psych    GI/Hepatic GERD  ,  Endo/Other    Renal/GU      Musculoskeletal   Abdominal   Peds  (+) ADHD Hematology   Anesthesia Other Findings IBS  Reproductive/Obstetrics                             Anesthesia Physical Anesthesia Plan  ASA: I  Anesthesia Plan: General   Post-op Pain Management:    Induction: Intravenous  Airway Management Planned: LMA and Oral ETT  Additional Equipment:   Intra-op Plan:   Post-operative Plan: Extubation in OR  Informed Consent: I have reviewed the patients History and Physical, chart, labs and discussed the procedure including the risks, benefits and alternatives for the proposed anesthesia with the patient or authorized representative who has indicated his/her understanding and acceptance.     Plan Discussed with: CRNA, Anesthesiologist and Surgeon  Anesthesia Plan Comments:        Anesthesia Quick Evaluation

## 2016-03-12 NOTE — H&P (View-Only) (Signed)
[name] 03/02/2016 2:01 PM Location: [facility] Patient #: [id number] DOB: 12/13/1970 Married / Language: English / Race: Refused to Report/Unreported Female   History of Present Illness Odis Hollingshead MD; 03/02/2016 5:19 PM) The patient is a 45 year old female.  Note:She is referred by Dr. Phineas Real because of a palpable right breast mass, 4:30 position at the border of the nipple-areolar complex. She discovered this on a breast self-examination in February of this year. Imaging studies, mammogram and [place], did not demonstrate a lesion. Insurance company denied an MRI. She has had subpectoral implants placed in the past. Other than this, she denies any trauma to the breast or nipple discharge. She does have a major risk factor for breast cancer in that her first child was born after the age of 45. She is here with her husband.  Medication History Jeralyn Ruths, CMA; 03/02/2016 2:21 PM) Estrace (0.5MG  Tablet, Oral daily) Active. Progesterone (100MG  Capsule, Oral daily) Active. Aspirin (81MG  Tablet, Oral daily) Active. Biotin (2500MCG Capsule, Oral daily) Active. Fexofenadine HCl (180MG  Tablet, Oral daily) Active. Finasteride (5MG  Tablet, 1/2 tablet Oral daily) Active. Multivitamin (Oral daily) Active. Senokot (8.6MG  Tablet, Oral daily) Active. Spironolactone (25MG  Tablet, two tablets Oral daily) Active. Zinc Gluconate (50MG  Tablet, Oral daily) Active. Amphetamine-Dextroamphet ER (20MG  Capsule ER 24HR, 2 capsules Oral daily) Active. Iron Polysacch Cmplx-B12-FA (150-0.025-1MG  Capsule, Oral daily) Active. Nitrofurantoin Macrocrystal (100MG  Capsule, Oral daily) Active. ValACYclovir HCl (500MG  Tablet, Oral as directed) Active. Mometasone Furoate (200MCG/ACT Aerosol, 2 sprays Inhalation daily) Active.    Physical Exam Odis Hollingshead MD; 03/02/2016 5:21 PM) The physical exam findings are as follows: Note:General: WDWN in NAD. Pleasant and  cooperative.  HEENT: /AT, no facial masses  EYES: EOMI, no icterus  NECK: Supple, no obvious mass.  CV: RRR, no murmur, no JVD.  CHEST: Breath sounds equal and clear. Respirations nonlabored.  BREASTS: Symmetrical in size. 8-10 mm smooth fix mass right breast areolar area at 4:30 position. No other masses felt in either breast.  LYMPHATIC: No palpable cervical, supraclavicular, axillary adenopathy.  SKIN: No jaundice.  NEUROLOGIC: Alert and oriented, answers questions appropriately.  PSYCHIATRIC: Normal mood, affect , and behavior.    Assessment & Plan Odis Hollingshead MD; 03/02/2016 5:22 PM) BREAST MASS, RIGHT (N63) Impression: There is discordance between the imaging studies which don't notice anything versus the physical exam. More information is needed. MRI would be helpful but if that denied again, I recommended removal of the mass. She does have a major risk factor for breast cancer in that age at first childbirth was over 22.  She has decided to proceed with removal. Plan:  Removal of the mass as an outpatient. I have explained the procedure, risks, and aftercare to her. Risks include but are not limited to bleeding, infection, wound problems, seroma formation, cosmetic deformity, anesthesia. She seems to [facility] and agrees with the plan.  Jackolyn Confer, MD

## 2016-03-15 ENCOUNTER — Encounter (HOSPITAL_BASED_OUTPATIENT_CLINIC_OR_DEPARTMENT_OTHER): Payer: Self-pay | Admitting: General Surgery

## 2016-03-15 NOTE — Addendum Note (Signed)
Addendum  created 03/15/16 1155 by Tawni Millers, CRNA   Modules edited: Charges VN

## 2016-03-18 ENCOUNTER — Telehealth: Payer: Self-pay

## 2016-03-18 NOTE — Telephone Encounter (Signed)
[name] at [facility] called to let me know the MRI (bilateral breast) for [name] has been approved. Authorization #[id number] is good for 2 mos.  I called patient and left message that I have received authorization and to give me a call to let me know regarding when I can/cannot get it scheduled for her.

## 2016-03-25 ENCOUNTER — Ambulatory Visit: Payer: 59 | Admitting: Medical

## 2016-03-25 ENCOUNTER — Ambulatory Visit (INDEPENDENT_AMBULATORY_CARE_PROVIDER_SITE_OTHER): Payer: 59 | Admitting: Medical

## 2016-03-25 ENCOUNTER — Encounter: Payer: Self-pay | Admitting: Medical

## 2016-03-25 VITALS — BP 100/60 | HR 61 | Temp 98.1°F | Ht 66.0 in | Wt 121.0 lb

## 2016-03-25 DIAGNOSIS — F411 Generalized anxiety disorder: Secondary | ICD-10-CM

## 2016-03-25 MED ORDER — ALPRAZOLAM 0.5 MG PO TABS: 0.5000 mg | ORAL_TABLET | Freq: Two times a day (BID) | ORAL | Status: DC | PRN

## 2016-03-25 NOTE — Patient Instructions (Signed)
For your recent anxiety related to life circumstances I am prescribing xanax.  If your anxiety worsens or you are getting getting depressed please notify us.  Follow up in 2 weeks or as needed

## 2016-03-25 NOTE — Progress Notes (Signed)
Subjective:    Patient ID: [name], female    DOB: July 03, 1971, 45 y.o.   MRN: [id number]  HPI   Pt in reporting some recent anxiety. Pt has seen Dr Charlett Blake in the past. Pt states in past she got med for anxiety in the past. Anxiety has been going on for about one week. No depression. Pt states husband and her are getting divorce.  Pt in past was on xanax. About 2014. None since then.    Review of Systems  Respiratory: Negative for cough, chest tightness, shortness of breath and wheezing.   Cardiovascular: Negative for chest pain and palpitations.  Gastrointestinal: Negative for abdominal pain.  Musculoskeletal: Negative for back pain.  Skin: Negative for rash.  Neurological: Negative for dizziness, weakness, numbness and headaches.  Psychiatric/Behavioral: Negative for suicidal ideas, hallucinations, behavioral problems, self-injury and dysphoric mood. The patient is nervous/anxious.    Past Medical History  Diagnosis Date  . IBS (irritable bowel syndrome)   . Interstitial cystitis   . Poor concentration 07/02/2011  . Multiple allergies 07/02/2011  . UTI (lower urinary tract infection) 08/28/2011  . Otitis media 03/16/2012  . Shoulder pain, left 04/11/2012  . Alopecia   . Anxiety and depression 07/31/2011  . Perimenopausal 07/22/2013  . HSV-2 (herpes simplex virus 2) infection   . Chest pain 09/22/10    Sharp pains in left breast & sometimes in the right breast as well. Random in occurence & nonexertional. Also has occasional SOB & DOE when going up stairs & occasionally when working out. Nuclear Stress Treadmill 09/29/10 -   . SOB (shortness of breath) 09/22/10    ECHO 09/29/10 - NL LVF, trivial PE, trivial TR/PR.  Marland Kitchen PONV (postoperative nausea and vomiting)      Social History   Social History  . Marital Status: Married    Spouse Name: N/A  . Number of Children: N/A  . Years of Education: N/A   Occupational History  . Not on file.   Social History Main Topics    . Smoking status: Never Smoker   . Smokeless tobacco: Never Used  . Alcohol Use: 0.0 oz/week    0 Standard drinks or equivalent per week     Comment: RARELY  . Drug Use: No  . Sexual Activity: Yes    Birth Control/ Protection: Surgical     Comment: Tubal lig-1st intercourse 45 yo-More than 5 partners   Other Topics Concern  . Not on file   Social History Narrative    Past Surgical History  Procedure Laterality Date  . Tonsillectomy  1982  . Rhinoplasty  2003  . Breast enhancement surgery  2007  . Cesarean section  2001 and 2004    x2  . Laparoscopy      twice  . Tubal ligation    . Breast biopsy Right 03/12/2016    Procedure: breast exploration;  Surgeon: Jackolyn Confer, MD;  Location: Center;  Service: General;  Laterality: Right;    Family History  Problem Relation Age of Onset  . Heart disease Mother     MVP  . Anxiety disorder Mother   . Mitral valve prolapse Mother   . Hypertension Brother   . Obesity Brother   . Diabetes Brother     DM, obese  . Allergies Daughter   . Allergies Son   . Kidney disease Maternal Grandmother     uremia    No Known Allergies  Current Outpatient Prescriptions on File Prior  to Visit  Medication Sig Dispense Refill  . amphetamine-dextroamphetamine (ADDERALL) 20 MG tablet Take 1 tablet (20 mg total) by mouth 2 (two) times daily. March 2017 60 tablet 0  . aspirin 81 MG tablet Take 81 mg by mouth daily.      . Biotin 2500 MCG CAPS Take 1 capsule by mouth daily.      . Calcium Citrate-Vitamin D 250-200 MG-UNIT TABS Take 1 tablet by mouth daily. 30 each 11  . Cholecalciferol (VITAMIN D) 2000 UNITS CAPS Take 1 capsule (2,000 Units total) by mouth once. 30 capsule 11  . estradiol-norethindrone (ACTIVELLA) 1-0.5 MG tablet Take 1 tablet by mouth daily.    . fexofenadine (ALLEGRA) 180 MG tablet Take 180 mg by mouth daily.      . finasteride (PROSCAR) 5 MG tablet Take 5 mg by mouth daily. Take 1/2 tab daily    . fish  oil-omega-3 fatty acids 1000 MG capsule Take 2 g by mouth daily.     . fluconazole (DIFLUCAN) 200 MG tablet Take 1 tablet (200 mg total) by mouth daily. 2 tablet 0  . HYDROcodone-acetaminophen (NORCO/VICODIN) 5-325 MG tablet Take 1-2 tablets by mouth every 4 (four) hours as needed for moderate pain or severe pain. 30 tablet 0  . iron polysaccharides (POLY-IRON 150) 150 MG capsule Take 1 capsule (150 mg total) by mouth 2 (two) times daily. 60 capsule 6  . mometasone (NASONEX) 50 MCG/ACT nasal spray Place 2 sprays into the nose daily. 17 g 6  . Multiple Vitamin (MULTIVITAMIN) capsule Take 1 capsule by mouth daily.      Marland Kitchen tretinoin (RETIN-A) 0.025 % cream     . valACYclovir (VALTREX) 500 MG tablet Take 1 tablet (500 mg total) by mouth daily. 30 tablet 12  . zinc gluconate 50 MG tablet Take 50 mg by mouth daily.     No current facility-administered medications on file prior to visit.    BP 100/60 mmHg  Pulse 61  Temp(Src) 98.1 F (36.7 C) (Oral)  Ht 5\' 6"  (1.676 m)  Wt 121 lb (54.885 kg)  BMI 19.54 kg/m2  SpO2 98%  LMP 07/03/2015       Objective:   Physical Exam  General Mental Status- Alert. General Appearance- Not in acute distress.    Chest and Lung Exam Auscultation: Breath Sounds:-Normal.  Cardiovascular Auscultation:Rythm- Regular. Murmurs & Other Heart Sounds:Auscultation of the heart reveals- No Murmurs.    Neurologic Cranial Nerve exam:- CN III-XII intact(No nystagmus), symmetric smile. Strength:- 5/5 equal and symmetric strength both upper and lower extremities.      Assessment & Plan:  For your recent anxiety related to life circumstances I am prescribing xanax.  If your anxiety worsens or you are getting getting depressed please notify us.  Follow up in 2 weeks or as needed  Discussed with Dr. Charlett Blake. Call us for update in 2 wks. She may refill at that time provided pt is stable.

## 2016-03-25 NOTE — Progress Notes (Signed)
Pre visit review using our clinic review tool, if applicable. No additional management support is needed unless otherwise documented below in the visit note. 

## 2016-04-08 ENCOUNTER — Ambulatory Visit: Payer: 59 | Admitting: Family Medicine

## 2016-04-09 ENCOUNTER — Telehealth: Payer: Self-pay | Admitting: Family Medicine

## 2016-04-09 NOTE — Telephone Encounter (Signed)
No charge. 

## 2016-04-09 NOTE — Telephone Encounter (Signed)
Pt was no show 04/08/16 for follow up appt, pt has not rescheduled, 2nd no show w/12 months, charge or no charge?

## 2016-04-12 ENCOUNTER — Encounter: Payer: Self-pay | Admitting: Family Medicine

## 2016-04-12 NOTE — Telephone Encounter (Signed)
Waiving fee, mailing reminder letter °

## 2016-05-17 ENCOUNTER — Telehealth: Payer: Self-pay | Admitting: Family Medicine

## 2016-05-17 NOTE — Telephone Encounter (Signed)
Called the patient informed of provider response to request. The patient has found the lost prescription today so no problem and will see PCP in august.

## 2016-05-17 NOTE — Telephone Encounter (Signed)
Requesting:  Adderall Contract    None UDS   None Last OV   12/26/2015 Last Refill   #60 with 0 refills on 12/29/2015  Please Advise

## 2016-05-17 NOTE — Telephone Encounter (Signed)
°  Relationship to patient: Self Can be reached: [phone]   Reason for call: Patient states she lost her Rx for Adderrall and needs another one. Plse adv

## 2016-05-17 NOTE — Telephone Encounter (Signed)
Last Rx I see written by PCP was for March 2017. She also no-showed for her appointment last month. She will need a follow-up with PCP before further refills will be given.

## 2016-06-21 ENCOUNTER — Other Ambulatory Visit: Payer: Self-pay | Admitting: Gynecology

## 2016-06-21 DIAGNOSIS — Z1231 Encounter for screening mammogram for malignant neoplasm of breast: Secondary | ICD-10-CM

## 2016-06-24 ENCOUNTER — Encounter: Payer: Self-pay | Admitting: Family Medicine

## 2016-06-24 ENCOUNTER — Ambulatory Visit (INDEPENDENT_AMBULATORY_CARE_PROVIDER_SITE_OTHER): Payer: 59 | Admitting: Family Medicine

## 2016-06-24 VITALS — BP 102/68 | HR 63 | Temp 98.5°F | Ht 66.0 in | Wt 122.1 lb

## 2016-06-24 DIAGNOSIS — K589 Irritable bowel syndrome without diarrhea: Secondary | ICD-10-CM

## 2016-06-24 DIAGNOSIS — F419 Anxiety disorder, unspecified: Secondary | ICD-10-CM

## 2016-06-24 DIAGNOSIS — F900 Attention-deficit hyperactivity disorder, predominantly inattentive type: Secondary | ICD-10-CM

## 2016-06-24 DIAGNOSIS — E559 Vitamin D deficiency, unspecified: Secondary | ICD-10-CM | POA: Diagnosis not present

## 2016-06-24 DIAGNOSIS — R002 Palpitations: Secondary | ICD-10-CM

## 2016-06-24 DIAGNOSIS — K219 Gastro-esophageal reflux disease without esophagitis: Secondary | ICD-10-CM

## 2016-06-24 DIAGNOSIS — Z23 Encounter for immunization: Secondary | ICD-10-CM | POA: Diagnosis not present

## 2016-06-24 DIAGNOSIS — F909 Attention-deficit hyperactivity disorder, unspecified type: Secondary | ICD-10-CM

## 2016-06-24 DIAGNOSIS — R41840 Attention and concentration deficit: Secondary | ICD-10-CM

## 2016-06-24 DIAGNOSIS — F418 Other specified anxiety disorders: Secondary | ICD-10-CM

## 2016-06-24 DIAGNOSIS — N3010 Interstitial cystitis (chronic) without hematuria: Secondary | ICD-10-CM

## 2016-06-24 DIAGNOSIS — N390 Urinary tract infection, site not specified: Secondary | ICD-10-CM

## 2016-06-24 DIAGNOSIS — R350 Frequency of micturition: Secondary | ICD-10-CM | POA: Diagnosis not present

## 2016-06-24 DIAGNOSIS — F329 Major depressive disorder, single episode, unspecified: Secondary | ICD-10-CM

## 2016-06-24 LAB — COMPREHENSIVE METABOLIC PANEL
ALK PHOS: 46 U/L (ref 39–117)
ALT: 13 U/L (ref 0–35)
AST: 13 U/L (ref 0–37)
Albumin: 4.6 g/dL (ref 3.5–5.2)
BUN: 18 mg/dL (ref 6–23)
CHLORIDE: 102 meq/L (ref 96–112)
CO2: 31 mEq/L (ref 19–32)
Calcium: 9.2 mg/dL (ref 8.4–10.5)
Creatinine, Ser: 0.6 mg/dL (ref 0.40–1.20)
GFR: 113.62 mL/min (ref >60.00)
GLUCOSE: 100 mg/dL — AB (ref 70–99)
POTASSIUM: 4 meq/L (ref 3.5–5.1)
Sodium: 137 mEq/L (ref 135–145)
TOTAL PROTEIN: 6.9 g/dL (ref 6.0–8.3)
Total Bilirubin: 0.7 mg/dL (ref 0.2–1.2)

## 2016-06-24 LAB — POCT URINALYSIS DIPSTICK
BILIRUBIN UA: NEGATIVE
Glucose, UA: NEGATIVE
KETONES UA: NEGATIVE
LEUKOCYTES UA: NEGATIVE
Nitrite, UA: NEGATIVE
PH UA: 7.5
PROTEIN UA: NEGATIVE
RBC UA: NEGATIVE
SPEC GRAV UA: 1.02
Urobilinogen, UA: NEGATIVE

## 2016-06-24 LAB — CBC
HEMATOCRIT: 39.6 % (ref 36.0–46.0)
HEMOGLOBIN: 13.2 g/dL (ref 12.0–15.0)
MCHC: 33.4 g/dL (ref 30.0–36.0)
MCV: 88.1 fl (ref 78.0–100.0)
Platelets: 187 10*3/uL (ref 150.0–400.0)
RBC: 4.5 Mil/uL (ref 3.87–5.11)
RDW: 13 % (ref 11.5–15.5)
WBC: 5.1 10*3/uL (ref 4.0–10.5)

## 2016-06-24 LAB — TSH: TSH: 1.39 u[IU]/mL (ref 0.35–4.50)

## 2016-06-24 LAB — VITAMIN D 25 HYDROXY (VIT D DEFICIENCY, FRACTURES): VITD: 30.09 ng/mL (ref 30.00–100.00)

## 2016-06-24 MED ORDER — AMPHETAMINE-DEXTROAMPHETAMINE 20 MG PO TABS: 20.0000 mg | ORAL_TABLET | Freq: Two times a day (BID) | ORAL | 0 refills | Status: DC

## 2016-06-24 MED ORDER — ALPRAZOLAM 0.5 MG PO TABS: 0.5000 mg | ORAL_TABLET | Freq: Two times a day (BID) | ORAL | 0 refills | Status: DC | PRN

## 2016-06-24 MED ORDER — ALPRAZOLAM 0.5 MG PO TABS: 0.5000 mg | ORAL_TABLET | Freq: Two times a day (BID) | ORAL | 2 refills | Status: DC | PRN

## 2016-06-24 NOTE — Progress Notes (Signed)
Pre visit review using our clinic review tool, if applicable. No additional management support is needed unless otherwise documented below in the visit note. 

## 2016-06-26 LAB — CULTURE, URINE COMPREHENSIVE: Organism ID, Bacteria: NO GROWTH

## 2016-07-02 ENCOUNTER — Ambulatory Visit
Admission: RE | Admit: 2016-07-02 | Discharge: 2016-07-02 | Disposition: A | Discharge status: Home / Self Care | Payer: 59 | Source: Ambulatory Visit | Attending: Gynecology | Admitting: Gynecology

## 2016-07-02 ENCOUNTER — Other Ambulatory Visit: Payer: Self-pay | Admitting: Gynecology

## 2016-07-02 DIAGNOSIS — Z1231 Encounter for screening mammogram for malignant neoplasm of breast: Secondary | ICD-10-CM

## 2016-07-11 NOTE — Assessment & Plan Note (Signed)
Urinalysis and culture negative, frequency likely related to anxiety

## 2016-07-11 NOTE — Progress Notes (Signed)
Patient ID: [name], female   DOB: 1971/03/20, 45 y.o.   MRN: [id number]   Subjective:    Patient ID: [name], female    DOB: 01-01-1971, 45 y.o.   MRN: [id number]  Chief Complaint  Patient presents with  . Follow-up    HPI Patient is in today for follow up. No recent illness but she is noting some urinary frequency but no dysuria, hematuria or other acute physical concern. She still is struggling with stress secondary to a failing marriage but she feels she is managing without medications. .Denies CP/palp/SOB/HA/congestion/fevers/GI c/o. Taking meds as prescribed  Past Medical History:  Diagnosis Date  . Alopecia   . Anxiety and depression 07/31/2011  . Chest pain 09/22/10   Sharp pains in left breast & sometimes in the right breast as well. Random in occurence & nonexertional. Also has occasional SOB & DOE when going up stairs & occasionally when working out. Nuclear Stress Treadmill 09/29/10 -   . HSV-2 (herpes simplex virus 2) infection   . IBS (irritable bowel syndrome)   . Interstitial cystitis   . Multiple allergies 07/02/2011  . Otitis media 03/16/2012  . Perimenopausal 07/22/2013  . PONV (postoperative nausea and vomiting)   . Poor concentration 07/02/2011  . Shoulder pain, left 04/11/2012  . SOB (shortness of breath) 09/22/10   ECHO 09/29/10 - NL LVF, trivial PE, trivial TR/PR.  Marland Kitchen UTI (lower urinary tract infection) 08/28/2011    Past Surgical History:  Procedure Laterality Date  . BREAST BIOPSY Right 03/12/2016   Procedure: breast exploration;  Surgeon: Jackolyn Confer, MD;  Location: [facility];  Service: General;  Laterality: Right;  . BREAST ENHANCEMENT SURGERY  2007  . CESAREAN SECTION  2001 and 2004   x2  . LAPAROSCOPY     twice  . RHINOPLASTY  2003  . TONSILLECTOMY  1982  . TUBAL LIGATION      Family History  Problem Relation Age of Onset  . Heart disease Mother     MVP  . Anxiety disorder Mother   . Mitral valve prolapse  Mother   . Hypertension Brother   . Obesity Brother   . Diabetes Brother     DM, obese  . Allergies Daughter   . Allergies Son   . Kidney disease Maternal Grandmother     uremia    Social History   Social History  . Marital status: Married    Spouse name: N/A  . Number of children: N/A  . Years of education: N/A   Occupational History  . Not on file.   Social History Main Topics  . Smoking status: Never Smoker  . Smokeless tobacco: Never Used  . Alcohol use 0.0 oz/week     Comment: RARELY  . Drug use: No  . Sexual activity: Yes    Birth control/ protection: Surgical     Comment: Tubal lig-1st intercourse 45 yo-More than 5 partners   Other Topics Concern  . Not on file   Social History Narrative  . No narrative on file    Outpatient Medications Prior to Visit  Medication Sig Dispense Refill  . aspirin 81 MG tablet Take 81 mg by mouth daily.      . Biotin 2500 MCG CAPS Take 1 capsule by mouth daily.      . Calcium Citrate-Vitamin D 250-200 MG-UNIT TABS Take 1 tablet by mouth daily. 30 each 11  . Cholecalciferol (VITAMIN D) 2000 UNITS CAPS Take 1 capsule (2,000 Units total)  by mouth once. 30 capsule 11  . estradiol-norethindrone (ACTIVELLA) 1-0.5 MG tablet Take 1 tablet by mouth daily.    . fexofenadine (ALLEGRA) 180 MG tablet Take 180 mg by mouth daily.      . finasteride (PROSCAR) 5 MG tablet Take 5 mg by mouth daily. Take 1/2 tab daily    . fish oil-omega-3 fatty acids 1000 MG capsule Take 2 g by mouth daily.     . iron polysaccharides (POLY-IRON 150) 150 MG capsule Take 1 capsule (150 mg total) by mouth 2 (two) times daily. 60 capsule 6  . mometasone (NASONEX) 50 MCG/ACT nasal spray Place 2 sprays into the nose daily. 17 g 6  . Multiple Vitamin (MULTIVITAMIN) capsule Take 1 capsule by mouth daily.      Marland Kitchen tretinoin (RETIN-A) 0.025 % cream     . valACYclovir (VALTREX) 500 MG tablet Take 1 tablet (500 mg total) by mouth daily. 30 tablet 12  . zinc gluconate 50 MG  tablet Take 50 mg by mouth daily.    Marland Kitchen ALPRAZolam (XANAX) 0.5 MG tablet Take 1 tablet (0.5 mg total) by mouth 2 (two) times daily as needed for anxiety. 14 tablet 0  . amphetamine-dextroamphetamine (ADDERALL) 20 MG tablet Take 1 tablet (20 mg total) by mouth 2 (two) times daily. March 2017 60 tablet 0  . fluconazole (DIFLUCAN) 200 MG tablet Take 1 tablet (200 mg total) by mouth daily. (Patient not taking: Reported on 06/24/2016) 2 tablet 0  . HYDROcodone-acetaminophen (NORCO/VICODIN) 5-325 MG tablet Take 1-2 tablets by mouth every 4 (four) hours as needed for moderate pain or severe pain. 30 tablet 0   No facility-administered medications prior to visit.     No Known Allergies  Review of Systems  Constitutional: Negative for fever and malaise/fatigue.  HENT: Negative for congestion.   Eyes: Negative for blurred vision and double vision.  Respiratory: Negative for shortness of breath.   Cardiovascular: Negative for chest pain, palpitations and leg swelling.  Gastrointestinal: Negative for abdominal pain, blood in stool and nausea.  Genitourinary: Positive for frequency. Negative for dysuria, hematuria and urgency.  Musculoskeletal: Negative for falls.  Skin: Negative for rash.  Neurological: Negative for dizziness, loss of consciousness and headaches.  Endo/Heme/Allergies: Negative for environmental allergies.  Psychiatric/Behavioral: Negative for depression. The patient is nervous/anxious.        Objective:    Physical Exam  Constitutional: She is oriented to person, place, and time. She appears well-developed and well-nourished. No distress.  HENT:  Head: Normocephalic and atraumatic.  Nose: Nose normal.  Eyes: Right eye exhibits no discharge. Left eye exhibits no discharge.  Neck: Normal range of motion. Neck supple.  Cardiovascular: Normal rate and regular rhythm.   No murmur heard. Pulmonary/Chest: Effort normal and breath sounds normal.  Abdominal: Soft. Bowel sounds are  normal. There is no tenderness.  Musculoskeletal: She exhibits no edema.  Neurological: She is alert and oriented to person, place, and time.  Skin: Skin is warm and dry.  Psychiatric: She has a normal mood and affect.  Nursing note and vitals reviewed.   BP 102/68 (BP Location: Left Arm, Patient Position: Sitting, Cuff Size: Normal)   Pulse 63   Temp 98.5 F (36.9 C) (Oral)   Ht 5\' 6"  (1.676 m)   Wt 122 lb 2 oz (55.4 kg)   LMP 07/03/2015   SpO2 98%   BMI 19.71 kg/m  Wt Readings from Last 3 Encounters:  06/24/16 122 lb 2 oz (55.4 kg)  03/25/16  121 lb (54.9 kg)  03/12/16 124 lb (56.2 kg)     Lab Results  Component Value Date   WBC 5.1 06/24/2016   HGB 13.2 06/24/2016   HCT 39.6 06/24/2016   PLT 187.0 06/24/2016   GLUCOSE 100 (H) 06/24/2016   CHOL 164 07/16/2015   TRIG 70 07/16/2015   HDL 40 (L) 07/16/2015   LDLCALC 110 07/16/2015   ALT 13 06/24/2016   AST 13 06/24/2016   NA 137 06/24/2016   K 4.0 06/24/2016   CL 102 06/24/2016   CREATININE 0.60 06/24/2016   BUN 18 06/24/2016   CO2 31 06/24/2016   TSH 1.39 06/24/2016    Lab Results  Component Value Date   TSH 1.39 06/24/2016   Lab Results  Component Value Date   WBC 5.1 06/24/2016   HGB 13.2 06/24/2016   HCT 39.6 06/24/2016   MCV 88.1 06/24/2016   PLT 187.0 06/24/2016   Lab Results  Component Value Date   NA 137 06/24/2016   K 4.0 06/24/2016   CO2 31 06/24/2016   GLUCOSE 100 (H) 06/24/2016   BUN 18 06/24/2016   CREATININE 0.60 06/24/2016   BILITOT 0.7 06/24/2016   ALKPHOS 46 06/24/2016   AST 13 06/24/2016   ALT 13 06/24/2016   PROT 6.9 06/24/2016   ALBUMIN 4.6 06/24/2016   CALCIUM 9.2 06/24/2016   GFR 113.62 06/24/2016   Lab Results  Component Value Date   CHOL 164 07/16/2015   Lab Results  Component Value Date   HDL 40 (L) 07/16/2015   Lab Results  Component Value Date   LDLCALC 110 07/16/2015   Lab Results  Component Value Date   TRIG 70 07/16/2015   Lab Results  Component  Value Date   CHOLHDL 4.1 07/16/2015   No results found for: HGBA1C     Assessment & Plan:   Problem List Items Addressed This Visit    Interstitial cystitis   Relevant Medications   amphetamine-dextroamphetamine (ADDERALL) 20 MG tablet   IBS (irritable bowel syndrome)   Relevant Medications   amphetamine-dextroamphetamine (ADDERALL) 20 MG tablet   GERD (gastroesophageal reflux disease)    Avoid offending foods, take probiotics.       Relevant Medications   amphetamine-dextroamphetamine (ADDERALL) 20 MG tablet   Other Relevant Orders   Comprehensive metabolic panel (Completed)   Adult ADHD    Allowed refills on Adderall tolerating meds and they are working well      Vitamin D deficiency   Relevant Medications   amphetamine-dextroamphetamine (ADDERALL) 20 MG tablet   Other Relevant Orders   VITAMIN D 25 Hydroxy (Vit-D Deficiency, Fractures) (Completed)   Poor concentration   Anxiety and depression    Struggling with failing marriage but is managing and caring well for her family. Does not fel she needs further medications at this time.      UTI (lower urinary tract infection)    Urinalysis and culture negative, frequency likely related to anxiety      Palpitations   Relevant Medications   amphetamine-dextroamphetamine (ADDERALL) 20 MG tablet   Other Relevant Orders   TSH (Completed)   CBC (Completed)    Other Visit Diagnoses    Frequent urination    -  Primary   Relevant Orders   POCT Urinalysis Dipstick (Completed)   CULTURE, URINE COMPREHENSIVE (Completed)   Attention deficit hyperactivity disorder (ADHD), unspecified ADHD type       Relevant Medications   amphetamine-dextroamphetamine (ADDERALL) 20 MG tablet   Encounter  for immunization       Relevant Orders   Flu Vaccine QUAD 36+ mos IM (Completed)      I have discontinued Ms. Ander's HYDROcodone-acetaminophen. I have also changed her amphetamine-dextroamphetamine, amphetamine-dextroamphetamine, and  amphetamine-dextroamphetamine. Additionally, I am having her maintain her fish oil-omega-3 fatty acids, multivitamin, aspirin, fexofenadine, Calcium Citrate-Vitamin D, Vitamin D, Biotin, tretinoin, zinc gluconate, finasteride, mometasone, valACYclovir, iron polysaccharides, estradiol-norethindrone, fluconazole, and ALPRAZolam.  Meds ordered this encounter  Medications  . DISCONTD: amphetamine-dextroamphetamine (ADDERALL) 20 MG tablet    Sig: Take 20 mg by mouth daily.  Marland Kitchen DISCONTD: amphetamine-dextroamphetamine (ADDERALL) 20 MG tablet    Sig: Take 20 mg by mouth daily.  Marland Kitchen amphetamine-dextroamphetamine (ADDERALL) 20 MG tablet    Sig: Take 1 tablet (20 mg total) by mouth 2 (two) times daily. August  2017    Dispense:  60 tablet    Refill:  0  . amphetamine-dextroamphetamine (ADDERALL) 20 MG tablet    Sig: Take 1 tablet (20 mg total) by mouth 2 (two) times daily. September 2017    Dispense:  60 tablet    Refill:  0  . amphetamine-dextroamphetamine (ADDERALL) 20 MG tablet    Sig: Take 1 tablet (20 mg total) by mouth 2 (two) times daily. October 2017    Dispense:  60 tablet    Refill:  0  . DISCONTD: ALPRAZolam (XANAX) 0.5 MG tablet    Sig: Take 1 tablet (0.5 mg total) by mouth 2 (two) times daily as needed for anxiety.    Dispense:  14 tablet    Refill:  0  . ALPRAZolam (XANAX) 0.5 MG tablet    Sig: Take 1 tablet (0.5 mg total) by mouth 2 (two) times daily as needed for anxiety.    Dispense:  40 tablet    Refill:  2     Penni Homans, MD

## 2016-07-11 NOTE — Assessment & Plan Note (Signed)
Allowed refills on Adderall tolerating meds and they are working well

## 2016-07-11 NOTE — Assessment & Plan Note (Deleted)
Allowed refills on Adderall tolerating meds and they are working well

## 2016-07-11 NOTE — Assessment & Plan Note (Signed)
Avoid offending foods, take probiotics.

## 2016-07-11 NOTE — Assessment & Plan Note (Signed)
Struggling with failing marriage but is managing and caring well for her family. Does not fel she needs further medications at this time.

## 2016-07-16 ENCOUNTER — Encounter: Payer: 59 | Admitting: Gynecology

## 2016-07-20 ENCOUNTER — Encounter: Payer: 59 | Admitting: Gynecology

## 2016-07-20 DIAGNOSIS — Z0289 Encounter for other administrative examinations: Secondary | ICD-10-CM

## 2016-09-17 ENCOUNTER — Ambulatory Visit: Payer: 59 | Admitting: Family Medicine

## 2016-09-28 ENCOUNTER — Ambulatory Visit: Payer: 59 | Admitting: Family Medicine

## 2016-10-19 ENCOUNTER — Ambulatory Visit (INDEPENDENT_AMBULATORY_CARE_PROVIDER_SITE_OTHER): Payer: 59 | Admitting: Family Medicine

## 2016-10-19 ENCOUNTER — Encounter: Payer: Self-pay | Admitting: Family Medicine

## 2016-10-19 DIAGNOSIS — F329 Major depressive disorder, single episode, unspecified: Secondary | ICD-10-CM

## 2016-10-19 DIAGNOSIS — R002 Palpitations: Secondary | ICD-10-CM | POA: Diagnosis not present

## 2016-10-19 DIAGNOSIS — F32A Depression, unspecified: Secondary | ICD-10-CM

## 2016-10-19 DIAGNOSIS — R41840 Attention and concentration deficit: Secondary | ICD-10-CM

## 2016-10-19 DIAGNOSIS — M542 Cervicalgia: Secondary | ICD-10-CM

## 2016-10-19 DIAGNOSIS — F909 Attention-deficit hyperactivity disorder, unspecified type: Secondary | ICD-10-CM | POA: Diagnosis not present

## 2016-10-19 DIAGNOSIS — K219 Gastro-esophageal reflux disease without esophagitis: Secondary | ICD-10-CM | POA: Diagnosis not present

## 2016-10-19 DIAGNOSIS — N3010 Interstitial cystitis (chronic) without hematuria: Secondary | ICD-10-CM

## 2016-10-19 DIAGNOSIS — E559 Vitamin D deficiency, unspecified: Secondary | ICD-10-CM

## 2016-10-19 DIAGNOSIS — K589 Irritable bowel syndrome without diarrhea: Secondary | ICD-10-CM

## 2016-10-19 DIAGNOSIS — F419 Anxiety disorder, unspecified: Secondary | ICD-10-CM

## 2016-10-19 DIAGNOSIS — F418 Other specified anxiety disorders: Secondary | ICD-10-CM

## 2016-10-19 MED ORDER — AMPHETAMINE-DEXTROAMPHETAMINE 20 MG PO TABS: 20.0000 mg | ORAL_TABLET | Freq: Two times a day (BID) | ORAL | 0 refills | Status: DC

## 2016-10-19 MED ORDER — CITALOPRAM HYDROBROMIDE 10 MG PO TABS: 10.0000 mg | ORAL_TABLET | Freq: Every day | ORAL | 3 refills | Status: DC

## 2016-10-19 MED ORDER — ALPRAZOLAM 0.5 MG PO TABS: 0.5000 mg | ORAL_TABLET | Freq: Two times a day (BID) | ORAL | 2 refills | Status: DC | PRN

## 2016-10-19 NOTE — Patient Instructions (Signed)
Generalized Anxiety Disorder Generalized anxiety disorder (GAD) is a mental disorder. It interferes with life functions, including relationships, work, and school. GAD is different from normal anxiety, which everyone experiences at some point in their lives in response to specific life events and activities. Normal anxiety actually helps us prepare for and get through these life events and activities. Normal anxiety goes away after the event or activity is over.  GAD causes anxiety that is not necessarily related to specific events or activities. It also causes excess anxiety in proportion to specific events or activities. The anxiety associated with GAD is also difficult to control. GAD can vary from mild to severe. People with severe GAD can have intense waves of anxiety with physical symptoms (panic attacks).  SYMPTOMS The anxiety and worry associated with GAD are difficult to control. This anxiety and worry are related to many life events and activities and also occur more days than not for 6 months or longer. People with GAD also have three or more of the following symptoms (one or more in children):  Restlessness.   Fatigue.  Difficulty concentrating.   Irritability.  Muscle tension.  Difficulty sleeping or unsatisfying sleep. DIAGNOSIS GAD is diagnosed through an assessment by your health care provider. Your health care provider will ask you questions aboutyour mood,physical symptoms, and events in your life. Your health care provider may ask you about your medical history and use of alcohol or drugs, including prescription medicines. Your health care provider may also do a physical exam and blood tests. Certain medical conditions and the use of certain substances can cause symptoms similar to those associated with GAD. Your health care provider may refer you to a mental health specialist for further evaluation. TREATMENT The following therapies are usually used to treat GAD:    Medication. Antidepressant medication usually is prescribed for long-term daily control. Antianxiety medicines may be added in severe cases, especially when panic attacks occur.   Talk therapy (psychotherapy). Certain types of talk therapy can be helpful in treating GAD by providing support, education, and guidance. A form of talk therapy called cognitive behavioral therapy can teach you healthy ways to think about and react to daily life events and activities.  Stress managementtechniques. These include yoga, meditation, and exercise and can be very helpful when they are practiced regularly. A mental health specialist can help determine which treatment is best for you. Some people see improvement with one therapy. However, other people require a combination of therapies. This information is not intended to replace advice given to you by your health care provider. Make sure you discuss any questions you have with your health care provider. Document Released: 02/12/2013 Document Revised: 11/08/2014 Document Reviewed: 02/12/2013 [employer] Interactive Patient Education  2017 [employer]  

## 2016-10-19 NOTE — Progress Notes (Signed)
Pre visit review using our clinic review tool, if applicable. No additional management support is needed unless otherwise documented below in the visit note. 

## 2016-11-03 NOTE — Assessment & Plan Note (Signed)
Avoid offending foods, start probiotics. Do not eat large meals in late evening and consider raising head of bed.  

## 2016-11-03 NOTE — Progress Notes (Signed)
Patient ID: [name], female   DOB: [birth year], 46 y.o.   MRN: [id number]   Subjective:    Patient ID: [name], female    DOB: Dec 31, 1970, 46 y.o.   MRN: [id number]  Chief Complaint  Patient presents with  . Follow-up    HPI Patient is in today for follow up. She is undergoing a messy divorce and is struggling with increased stress.she is undergoing a messy divorce. The has intermittent anxiety attacks with palpitations. Most notable palpitations are fleeting. Denies CP/palp/SOB/HA/congestion/fevers/GI or GU c/o. Taking meds as prescribed  Past Medical History:  Diagnosis Date  . Alopecia   . Anxiety and depression 07/31/2011  . Chest pain 09/22/10   Sharp pains in left breast & sometimes in the right breast as well. Random in occurence & nonexertional. Also has occasional SOB & DOE when going up stairs & occasionally when working out. Nuclear Stress Treadmill 09/29/10 -   . HSV-2 (herpes simplex virus 2) infection   . IBS (irritable bowel syndrome)   . Interstitial cystitis   . Multiple allergies 07/02/2011  . Otitis media 03/16/2012  . Perimenopausal 07/22/2013  . PONV (postoperative nausea and vomiting)   . Poor concentration 07/02/2011  . Shoulder pain, left 04/11/2012  . SOB (shortness of breath) 09/22/10   ECHO 09/29/10 - NL LVF, trivial PE, trivial TR/PR.  Marland Kitchen UTI (lower urinary tract infection) 08/28/2011    Past Surgical History:  Procedure Laterality Date  . BREAST BIOPSY Right 03/12/2016   Procedure: breast exploration;  Surgeon: Jackolyn Confer, MD;  Location: [facility];  Service: General;  Laterality: Right;  . BREAST ENHANCEMENT SURGERY  2007  . CESAREAN SECTION  2001 and 2004   x2  . LAPAROSCOPY     twice  . RHINOPLASTY  2003  . TONSILLECTOMY  1982  . TUBAL LIGATION      Family History  Problem Relation Age of Onset  . Heart disease Mother     MVP  . Anxiety disorder Mother   . Mitral valve prolapse Mother   . Hypertension  Brother   . Obesity Brother   . Diabetes Brother     DM, obese  . Allergies Daughter   . Allergies Son   . Kidney disease Maternal Grandmother     uremia    Social History   Social History  . Marital status: Married    Spouse name: N/A  . Number of children: N/A  . Years of education: N/A   Occupational History  . Not on file.   Social History Main Topics  . Smoking status: Never Smoker  . Smokeless tobacco: Never Used  . Alcohol use 0.0 oz/week     Comment: RARELY  . Drug use: No  . Sexual activity: Yes    Birth control/ protection: Surgical     Comment: Tubal lig-1st intercourse 46 yo-More than 5 partners   Other Topics Concern  . Not on file   Social History Narrative  . No narrative on file    Outpatient Medications Prior to Visit  Medication Sig Dispense Refill  . aspirin 81 MG tablet Take 81 mg by mouth daily.      . Biotin 2500 MCG CAPS Take 1 capsule by mouth daily.      . Calcium Citrate-Vitamin D 250-200 MG-UNIT TABS Take 1 tablet by mouth daily. 30 each 11  . Cholecalciferol (VITAMIN D) 2000 UNITS CAPS Take 1 capsule (2,000 Units total) by mouth once. 30 capsule 11  .  estradiol-norethindrone (ACTIVELLA) 1-0.5 MG tablet Take 1 tablet by mouth daily.    . fexofenadine (ALLEGRA) 180 MG tablet Take 180 mg by mouth daily.      . finasteride (PROSCAR) 5 MG tablet Take 5 mg by mouth daily. Take 1/2 tab daily    . fish oil-omega-3 fatty acids 1000 MG capsule Take 2 g by mouth daily.     . fluconazole (DIFLUCAN) 200 MG tablet Take 1 tablet (200 mg total) by mouth daily. 2 tablet 0  . iron polysaccharides (POLY-IRON 150) 150 MG capsule Take 1 capsule (150 mg total) by mouth 2 (two) times daily. 60 capsule 6  . mometasone (NASONEX) 50 MCG/ACT nasal spray Place 2 sprays into the nose daily. 17 g 6  . Multiple Vitamin (MULTIVITAMIN) capsule Take 1 capsule by mouth daily.      Marland Kitchen tretinoin (RETIN-A) 0.025 % cream     . valACYclovir (VALTREX) 500 MG tablet Take 1  tablet (500 mg total) by mouth daily. 30 tablet 12  . zinc gluconate 50 MG tablet Take 50 mg by mouth daily.    Marland Kitchen ALPRAZolam (XANAX) 0.5 MG tablet Take 1 tablet (0.5 mg total) by mouth 2 (two) times daily as needed for anxiety. 40 tablet 2  . amphetamine-dextroamphetamine (ADDERALL) 20 MG tablet Take 1 tablet (20 mg total) by mouth 2 (two) times daily. August  2017 60 tablet 0  . amphetamine-dextroamphetamine (ADDERALL) 20 MG tablet Take 1 tablet (20 mg total) by mouth 2 (two) times daily. September 2017 60 tablet 0  . amphetamine-dextroamphetamine (ADDERALL) 20 MG tablet Take 1 tablet (20 mg total) by mouth 2 (two) times daily. October 2017 60 tablet 0   No facility-administered medications prior to visit.     No Known Allergies  Review of Systems  Constitutional: Negative for fever and malaise/fatigue.  HENT: Negative for congestion.   Eyes: Negative for blurred vision.  Respiratory: Negative for shortness of breath.   Cardiovascular: Negative for chest pain, palpitations and leg swelling.  Gastrointestinal: Negative for abdominal pain, blood in stool and nausea.  Genitourinary: Negative for dysuria and frequency.  Musculoskeletal: Negative for falls.  Skin: Negative for rash.  Neurological: Negative for dizziness, loss of consciousness and headaches.  Endo/Heme/Allergies: Negative for environmental allergies.  Psychiatric/Behavioral: Negative for depression. The patient is not nervous/anxious.        Objective:    Physical Exam  Constitutional: She is oriented to person, place, and time. She appears well-developed and well-nourished. No distress.  HENT:  Head: Normocephalic and atraumatic.  Nose: Nose normal.  Eyes: Right eye exhibits no discharge. Left eye exhibits no discharge.  Neck: Normal range of motion. Neck supple.  Cardiovascular: Normal rate and regular rhythm.   No murmur heard. Pulmonary/Chest: Effort normal and breath sounds normal.  Abdominal: Soft. Bowel  sounds are normal. There is no tenderness.  Musculoskeletal: She exhibits no edema.  Neurological: She is alert and oriented to person, place, and time.  Skin: Skin is warm and dry.  Psychiatric: She has a normal mood and affect.  Nursing note and vitals reviewed.   BP 108/64 (BP Location: Left Arm, Patient Position: Sitting, Cuff Size: Normal)   Pulse 71   Temp 98.2 F (36.8 C) (Oral)   Ht 5\' 6"  (1.676 m)   Wt 120 lb 4 oz (54.5 kg)   LMP 07/03/2015   SpO2 98%   BMI 19.41 kg/m  Wt Readings from Last 3 Encounters:  10/19/16 120 lb 4 oz (54.5 kg)  06/24/16 122 lb 2 oz (55.4 kg)  03/25/16 121 lb (54.9 kg)     Lab Results  Component Value Date   WBC 5.1 06/24/2016   HGB 13.2 06/24/2016   HCT 39.6 06/24/2016   PLT 187.0 06/24/2016   GLUCOSE 100 (H) 06/24/2016   CHOL 164 07/16/2015   TRIG 70 07/16/2015   HDL 40 (L) 07/16/2015   LDLCALC 110 07/16/2015   ALT 13 06/24/2016   AST 13 06/24/2016   NA 137 06/24/2016   K 4.0 06/24/2016   CL 102 06/24/2016   CREATININE 0.60 06/24/2016   BUN 18 06/24/2016   CO2 31 06/24/2016   TSH 1.39 06/24/2016    Lab Results  Component Value Date   TSH 1.39 06/24/2016   Lab Results  Component Value Date   WBC 5.1 06/24/2016   HGB 13.2 06/24/2016   HCT 39.6 06/24/2016   MCV 88.1 06/24/2016   PLT 187.0 06/24/2016   Lab Results  Component Value Date   NA 137 06/24/2016   K 4.0 06/24/2016   CO2 31 06/24/2016   GLUCOSE 100 (H) 06/24/2016   BUN 18 06/24/2016   CREATININE 0.60 06/24/2016   BILITOT 0.7 06/24/2016   ALKPHOS 46 06/24/2016   AST 13 06/24/2016   ALT 13 06/24/2016   PROT 6.9 06/24/2016   ALBUMIN 4.6 06/24/2016   CALCIUM 9.2 06/24/2016   GFR 113.62 06/24/2016   Lab Results  Component Value Date   CHOL 164 07/16/2015   Lab Results  Component Value Date   HDL 40 (L) 07/16/2015   Lab Results  Component Value Date   LDLCALC 110 07/16/2015   Lab Results  Component Value Date   TRIG 70 07/16/2015   Lab  Results  Component Value Date   CHOLHDL 4.1 07/16/2015   No results found for: HGBA1C     Assessment & Plan:   Problem List Items Addressed This Visit    Interstitial cystitis   Relevant Medications   amphetamine-dextroamphetamine (ADDERALL) 20 MG tablet   IBS (irritable bowel syndrome)   Relevant Medications   amphetamine-dextroamphetamine (ADDERALL) 20 MG tablet   GERD (gastroesophageal reflux disease)    Avoid offending foods, start probiotics. Do not eat large meals in late evening and consider raising head of bed.       Relevant Medications   amphetamine-dextroamphetamine (ADDERALL) 20 MG tablet   Vitamin D deficiency    Continue daily supplements      Relevant Medications   amphetamine-dextroamphetamine (ADDERALL) 20 MG tablet   Poor concentration    May continue Adderall which she tolerates med      Anxiety and depression    Undergoing a messy divorce at this time. Will restart SSRI amd maintain other meds prn      Palpitations   Relevant Medications   amphetamine-dextroamphetamine (ADDERALL) 20 MG tablet   Neck pain on right side    Encouraged moist heat and gentle stretching as tolerated. May try NSAIDs and prescription meds as directed and report if symptoms worsen or seek immediate care       Other Visit Diagnoses    Attention deficit hyperactivity disorder (ADHD), unspecified ADHD type       Relevant Medications   amphetamine-dextroamphetamine (ADDERALL) 20 MG tablet      I have changed Ms. Lybrand's amphetamine-dextroamphetamine, amphetamine-dextroamphetamine, and amphetamine-dextroamphetamine. I am also having her start on citalopram. Additionally, I am having her maintain her fish oil-omega-3 fatty acids, multivitamin, aspirin, fexofenadine, Calcium Citrate-Vitamin D, Vitamin D, Biotin,  tretinoin, zinc gluconate, finasteride, mometasone, valACYclovir, iron polysaccharides, estradiol-norethindrone, fluconazole, and ALPRAZolam.  Meds ordered this  encounter  Medications  . amphetamine-dextroamphetamine (ADDERALL) 20 MG tablet    Sig: Take 1 tablet (20 mg total) by mouth 2 (two) times daily. December  2017    Dispense:  60 tablet    Refill:  0  . amphetamine-dextroamphetamine (ADDERALL) 20 MG tablet    Sig: Take 1 tablet (20 mg total) by mouth 2 (two) times daily. January 2018    Dispense:  60 tablet    Refill:  0  . amphetamine-dextroamphetamine (ADDERALL) 20 MG tablet    Sig: Take 1 tablet (20 mg total) by mouth 2 (two) times daily. February 2018    Dispense:  60 tablet    Refill:  0  . ALPRAZolam (XANAX) 0.5 MG tablet    Sig: Take 1 tablet (0.5 mg total) by mouth 2 (two) times daily as needed for anxiety.    Dispense:  40 tablet    Refill:  2  . citalopram (CELEXA) 10 MG tablet    Sig: Take 1 tablet (10 mg total) by mouth daily.    Dispense:  30 tablet    Refill:  3     Penni Homans, MD

## 2016-11-03 NOTE — Assessment & Plan Note (Signed)
Undergoing a messy divorce at this time. Will restart SSRI amd maintain other meds prn

## 2016-11-03 NOTE — Assessment & Plan Note (Signed)
Continue daily supplements 

## 2016-11-03 NOTE — Assessment & Plan Note (Signed)
Encouraged moist heat and gentle stretching as tolerated. May try NSAIDs and prescription meds as directed and report if symptoms worsen or seek immediate care 

## 2016-11-03 NOTE — Assessment & Plan Note (Signed)
May continue Adderall which she tolerates med

## 2016-11-24 ENCOUNTER — Encounter: Payer: Self-pay | Admitting: Gynecology

## 2016-11-24 ENCOUNTER — Ambulatory Visit (INDEPENDENT_AMBULATORY_CARE_PROVIDER_SITE_OTHER): Payer: 59 | Admitting: Gynecology

## 2016-11-24 VITALS — BP 116/70

## 2016-11-24 DIAGNOSIS — N950 Postmenopausal bleeding: Secondary | ICD-10-CM | POA: Diagnosis not present

## 2016-11-24 MED ORDER — PROGESTERONE MICRONIZED 100 MG PO CAPS: 100.0000 mg | ORAL_CAPSULE | Freq: Every day | ORAL | 11 refills | Status: DC

## 2016-11-24 MED ORDER — ESTRADIOL 0.5 MG PO TABS: 0.5000 mg | ORAL_TABLET | Freq: Every day | ORAL | 11 refills | Status: DC

## 2016-11-24 NOTE — Patient Instructions (Signed)
Follow up for ultrasound as scheduled 

## 2016-11-24 NOTE — Progress Notes (Signed)
    [name] [name] [id number]        45 y.o.  [id number] presents complaining of 2 days of spotting following intercourse for the first time in over a year. Not having pain cramping or other symptoms. Had been on HRT to include estradiol 0.5 mg and Prometrium 100 mg at night but admits to not taking it on a regular basis. Has not taken her medication over a month. Not having any hot flushes or sweats. History of premature menopause in the past.  Last Pap smear 07/2014 normal with negative HPV. No vaginal discharge irritation or odor. No urinary symptoms such as frequency dysuria or urgency.  Past medical history,surgical history, problem list, medications, allergies, family history and social history were all reviewed and documented in the EPIC chart.  Directed ROS with pertinent positives and negatives documented in the history of present illness/assessment and plan.  Exam: Copywriter, advertising Vitals:   11/24/16 1041  BP: 116/70   General appearance:  Normal Abdomen soft nontender without masses guarding rebound Pelvic external BUS vagina normal with no evidence of bleeding. Cervix grossly normal with no bleeding. Uterus normal size midline mobile nontender. Adnexa without masses or tenderness.  Assessment/Plan:  46 y.o. [id number] with episode of vaginal spotting 2 days but no evidence of bleeding on exam. Reviewed differential to include traumatic bleeding with vaginal irritation although did not see evidence of this. Also reviewed possibilities of endometrial abdomen allergies to include hyperplasia polyps or possibly just atrophic changes leading to the spotting. Recommend baseline ultrasound for endometrial assessment. Possible sonohysterogram if endometrial echo is thicker. I also reviewed the whole HRT issue with her again particular in a patient who underwent premature menopause. Possible benefits of early initiation to include cardiovascular protection and bone health as well as  vaginal health discussed. Risks to include thrombosis such as stroke heart attack DVT reviewed. She is very active never smoked and not being followed for significant medical issues. The issues of breast cancer also discussed and HRT as per the [facility] study. Recently had breast exploration for a nodule that was palpated with turned out to be a fold in her implant. Patient wants to go ahead and reinitiate now. Estradiol 0.5 mg and Prometrium 100 mg prescribed. Patient will follow up ultrasound. She also has an appointment to see me for her annual exam next month and will follow up for this.    Anastasio Auerbach MD, 11:33 AM 11/24/2016

## 2016-11-25 ENCOUNTER — Other Ambulatory Visit: Payer: Self-pay | Admitting: Gynecology

## 2016-11-25 DIAGNOSIS — N950 Postmenopausal bleeding: Secondary | ICD-10-CM

## 2016-12-06 ENCOUNTER — Other Ambulatory Visit: Payer: 59

## 2016-12-06 ENCOUNTER — Ambulatory Visit: Payer: 59 | Admitting: Gynecology

## 2016-12-16 ENCOUNTER — Other Ambulatory Visit: Payer: Self-pay | Admitting: Gynecology

## 2016-12-20 ENCOUNTER — Encounter: Payer: Self-pay | Admitting: Gynecology

## 2016-12-20 ENCOUNTER — Ambulatory Visit (INDEPENDENT_AMBULATORY_CARE_PROVIDER_SITE_OTHER): Payer: 59

## 2016-12-20 ENCOUNTER — Ambulatory Visit (INDEPENDENT_AMBULATORY_CARE_PROVIDER_SITE_OTHER): Payer: 59 | Admitting: Gynecology

## 2016-12-20 VITALS — BP 114/64

## 2016-12-20 DIAGNOSIS — N950 Postmenopausal bleeding: Secondary | ICD-10-CM | POA: Diagnosis not present

## 2016-12-20 NOTE — Patient Instructions (Signed)
Office will call you with biopsy results 

## 2016-12-20 NOTE — Progress Notes (Signed)
    [name] [birth year] [id number]        46 y.o.  [id number] presents for sonohysterogram due to single episode of spotting after intercourse. Started on HRT to include Estrace 0.5 mg and Prometrium 100 mg doing well feeling better and noting improved hair growth.  Past medical history,surgical history, problem list, medications, allergies, family history and social history were all reviewed and documented in the EPIC chart.  Directed ROS with pertinent positives and negatives documented in the history of present illness/assessment and plan.  Exam: [place] assistant Vitals:   12/20/16 1252  BP: 114/64   General appearance:  Normal External BUS vagina normal. Cervix normal. Uterus normal size midline mobile nontender. Adnexa without masses or tenderness.  Ultrasound transvaginal shows uterus normal size and echotexture. Endometrial echo 3.7 mm. Right and left ovaries normal. Cul-de-sac negative.  Sonohysterogram performed, sterile technique, easy catheter introduction, good distention with no abnormalities. Endometrial sample taken. Patient tolerated well.  Assessment/Plan:  46 y.o. [id number] with single episode of spotting. Ultrasound shows thin endometrium. Sonohysterogram showed no cavitary abnormalities. Patient will follow up for biopsy results.  I reviewed with her that it may be inadequate noting the thin endometrium and that would be okay in this clinical situation. Patient will continue on HRT as we discussed. Assuming she continues well then she'll stay on this.    Anastasio Auerbach MD, 1:17 PM 12/20/2016

## 2016-12-21 ENCOUNTER — Encounter: Payer: Self-pay | Admitting: Gynecology

## 2016-12-21 ENCOUNTER — Encounter: Payer: 59 | Admitting: Gynecology

## 2016-12-21 ENCOUNTER — Ambulatory Visit (INDEPENDENT_AMBULATORY_CARE_PROVIDER_SITE_OTHER): Payer: 59 | Admitting: Gynecology

## 2016-12-21 VITALS — BP 104/66 | Ht 66.5 in | Wt 124.0 lb

## 2016-12-21 DIAGNOSIS — N951 Menopausal and female climacteric states: Secondary | ICD-10-CM

## 2016-12-21 DIAGNOSIS — Z13220 Encounter for screening for lipoid disorders: Secondary | ICD-10-CM | POA: Diagnosis not present

## 2016-12-21 DIAGNOSIS — Z79890 Hormone replacement therapy (postmenopausal): Secondary | ICD-10-CM | POA: Diagnosis not present

## 2016-12-21 DIAGNOSIS — Z01419 Encounter for gynecological examination (general) (routine) without abnormal findings: Secondary | ICD-10-CM

## 2016-12-21 DIAGNOSIS — E28319 Asymptomatic premature menopause: Secondary | ICD-10-CM | POA: Diagnosis not present

## 2016-12-21 DIAGNOSIS — N898 Other specified noninflammatory disorders of vagina: Secondary | ICD-10-CM | POA: Diagnosis not present

## 2016-12-21 DIAGNOSIS — Z113 Encounter for screening for infections with a predominantly sexual mode of transmission: Secondary | ICD-10-CM

## 2016-12-21 LAB — CBC WITH DIFFERENTIAL/PLATELET
BASOS PCT: 1 %
Basophils Absolute: 41 cells/uL (ref 0–200)
EOS PCT: 1 %
Eosinophils Absolute: 41 cells/uL (ref 15–500)
HCT: 39.6 % (ref 35.0–45.0)
Hemoglobin: 13.1 g/dL (ref 11.7–15.5)
LYMPHS PCT: 32 %
Lymphs Abs: 1312 cells/uL (ref 850–3900)
MCH: 29.6 pg (ref 27.0–33.0)
MCHC: 33.1 g/dL (ref 32.0–36.0)
MCV: 89.6 fL (ref 80.0–100.0)
MONOS PCT: 6 %
MPV: 10.9 fL (ref 7.5–12.5)
Monocytes Absolute: 246 cells/uL (ref 200–950)
NEUTROS PCT: 60 %
Neutro Abs: 2460 cells/uL (ref 1500–7800)
PLATELETS: 193 10*3/uL (ref 140–400)
RBC: 4.42 MIL/uL (ref 3.80–5.10)
RDW: 13.2 % (ref 11.0–15.0)
WBC: 4.1 10*3/uL (ref 3.8–10.8)

## 2016-12-21 LAB — COMPREHENSIVE METABOLIC PANEL
ALT: 14 U/L (ref 6–29)
AST: 14 U/L (ref 10–35)
Albumin: 4.5 g/dL (ref 3.6–5.1)
Alkaline Phosphatase: 51 U/L (ref 33–115)
BILIRUBIN TOTAL: 0.7 mg/dL (ref 0.2–1.2)
BUN: 20 mg/dL (ref 7–25)
CALCIUM: 9.6 mg/dL (ref 8.6–10.2)
CO2: 27 mmol/L (ref 20–31)
CREATININE: 0.71 mg/dL (ref 0.50–1.10)
Chloride: 103 mmol/L (ref 98–110)
GLUCOSE: 92 mg/dL (ref 65–99)
Potassium: 4 mmol/L (ref 3.5–5.3)
SODIUM: 140 mmol/L (ref 135–146)
Total Protein: 6.9 g/dL (ref 6.1–8.1)

## 2016-12-21 LAB — LIPID PANEL
Cholesterol: 189 mg/dL (ref <200)
HDL: 62 mg/dL (ref >50)
LDL CALC: 116 mg/dL — AB (ref <100)
Total CHOL/HDL Ratio: 3 Ratio (ref <5.0)
Triglycerides: 54 mg/dL (ref <150)
VLDL: 11 mg/dL (ref <30)

## 2016-12-21 LAB — TSH: TSH: 1.34 mIU/L

## 2016-12-21 MED ORDER — NITROFURANTOIN MONOHYD MACRO 100 MG PO CAPS: 100.0000 mg | ORAL_CAPSULE | Freq: Once | ORAL | 3 refills | Status: AC

## 2016-12-21 MED ORDER — FLUCONAZOLE 150 MG PO TABS: 150.0000 mg | ORAL_TABLET | Freq: Once | ORAL | 0 refills | Status: AC

## 2016-12-21 NOTE — Patient Instructions (Signed)

## 2016-12-21 NOTE — Progress Notes (Signed)
    [name] 15-Feb-1971 [id number]        46 y.o.  [id number] for annual exam.   Past medical history,surgical history, problem list, medications, allergies, family history and social history were all reviewed and documented as reviewed in the EPIC chart.  ROS:  Performed with pertinent positives and negatives included in the history, assessment and plan.   Additional significant findings :  None   Exam: Copywriter, advertising Vitals:   12/21/16 1038  BP: 104/66  Weight: 124 lb (56.2 kg)  Height: 5' 6.5" (1.689 m)   Body mass index is 19.71 kg/m.  General appearance:  Normal affect, orientation and appearance. Skin: Grossly normal HEENT: Without gross lesions.  No cervical or supraclavicular adenopathy. Thyroid normal.  Lungs:  Clear without wheezing, rales or rhonchi Cardiac: RR, without RMG Abdominal:  Soft, nontender, without masses, guarding, rebound, organomegaly or hernia Breasts:  Examined lying and sitting without masses, retractions, discharge or axillary adenopathy.  Bilateral implants noted Pelvic:  Ext, BUS, Vagina normal  Cervix normal. Pap smear, GC/Chlamydia  Uterus anteverted, normal size, shape and contour, midline and mobile nontender   Adnexa without masses or tenderness    Anus and perineum normal   Rectovaginal normal sphincter tone without palpated masses or tenderness.    Assessment/Plan:  46 y.o. [id number] female for annual exam.   1. History premature menopause.  Recent workup for postmenopausal spotting negative. Recently started on estradiol 0.5 mg and Prometrium 100 mg feeling better on this. We discussed the risks to include thrombosis such as stroke heart attack DVT and breast cancer. She is comfortable with this and is going to continue. She has supply at home but will call when she needs more. Will call if she does any further bleeding. 2. Pap smear/HPV 2015. Patient uncomfortable with current screening guidelines and prefers screening annually.  Pap smear done today. No history of abnormal Pap smears. 3. Mammography 2017. Continue with annual mammography when due. SBE monthly reviewed. Had recent exploration right breast for lump which turned out to be a fold in her implant. 4. History of HSV. Taking Valtrex daily. Has supply at home but will: Needs more. 5. History of postcoital UTIs. Uses Macrobid 100 mg after intercourse. #30 with 3 refills provided. 6. STD screening requested. No known exposure wants to be screened. GC/Chlamydia, hepatitis B, hepatitis C, RPR, HIV ordered. 7. Health maintenance. CBC, CMP, lipid profile, urinalysis, TSH due to complaints of some hot flushes ordered. Follow up in one year, sooner as needed.   Anastasio Auerbach MD, 11:12 AM 12/21/2016

## 2016-12-22 LAB — URINALYSIS W MICROSCOPIC + REFLEX CULTURE
BILIRUBIN URINE: NEGATIVE
Bacteria, UA: NONE SEEN [HPF]
CRYSTALS: NONE SEEN [HPF]
Casts: NONE SEEN [LPF]
Glucose, UA: NEGATIVE
Hgb urine dipstick: NEGATIVE
Ketones, ur: NEGATIVE
Leukocytes, UA: NEGATIVE
NITRITE: NEGATIVE
PH: 5 (ref 5.0–8.0)
Protein, ur: NEGATIVE
RBC / HPF: NONE SEEN RBC/HPF (ref <=2)
SPECIFIC GRAVITY, URINE: 1.017 (ref 1.001–1.035)
Squamous Epithelial / HPF: NONE SEEN [HPF] (ref <=5)
WBC UA: NONE SEEN WBC/HPF (ref <=5)
Yeast: NONE SEEN [HPF]

## 2016-12-22 LAB — HEPATITIS B SURFACE ANTIGEN: Hepatitis B Surface Ag: NEGATIVE

## 2016-12-22 LAB — PAP IG W/ RFLX HPV ASCU

## 2016-12-22 LAB — HEPATITIS C ANTIBODY: HCV AB: NEGATIVE

## 2016-12-22 LAB — RPR

## 2016-12-22 LAB — GC/CHLAMYDIA PROBE AMP
CT PROBE, AMP APTIMA: NOT DETECTED
GC Probe RNA: NOT DETECTED

## 2016-12-22 LAB — HIV ANTIBODY (ROUTINE TESTING W REFLEX): HIV 1&2 Ab, 4th Generation: NONREACTIVE

## 2017-01-25 ENCOUNTER — Encounter: Payer: Self-pay | Admitting: Gynecology

## 2017-01-25 ENCOUNTER — Ambulatory Visit (INDEPENDENT_AMBULATORY_CARE_PROVIDER_SITE_OTHER): Payer: 59 | Admitting: Gynecology

## 2017-01-25 ENCOUNTER — Ambulatory Visit: Payer: 59 | Admitting: Family Medicine

## 2017-01-25 VITALS — BP 110/70

## 2017-01-25 DIAGNOSIS — Z113 Encounter for screening for infections with a predominantly sexual mode of transmission: Secondary | ICD-10-CM

## 2017-01-25 NOTE — Progress Notes (Signed)
    [name] 06/24/1971 [id number]        46 y.o.  [name] presents for STD screening. Has most recently become sexually active after 2 years of abstinence. Has no known exposure but wants to be screened. Did have STD screening end of February but wants to repeat all of the testing. No vaginal discharge or symptoms such as vulvar lesions.  Past medical history,surgical history, problem list, medications, allergies, family history and social history were all reviewed and documented in the EPIC chart.  Directed ROS with pertinent positives and negatives documented in the history of present illness/assessment and plan.  Exam: Copywriter, advertising Vitals:   01/25/17 1407  BP: 110/70   General appearance:  Normal Abdomen soft nontender without masses guarding rebound Pelvic external BUS vagina normal. Cervix normal. Uterus normal size midline mobile nontender. Adnexa without masses or tenderness  Assessment/Plan:  46 y.o. [name] for STD screening. GC/Chlamydia, hepatitis B, hepatitis C, HIV, RPR done. HSV/HPV discussed. Options to rescreen serum in several months to allow for serum conversion discussed and she will call if she wants to pursue this.    Anastasio Auerbach MD, 2:19 PM 01/25/2017

## 2017-01-25 NOTE — Patient Instructions (Signed)
Office will call you with test results 

## 2017-01-25 NOTE — Addendum Note (Signed)
Addended by: Thurnell Garbe A on: 01/25/2017 02:53 PM   Modules accepted: Orders

## 2017-01-26 ENCOUNTER — Other Ambulatory Visit: Payer: 59

## 2017-01-26 LAB — GC/CHLAMYDIA PROBE AMP
CT Probe RNA: NOT DETECTED
GC Probe RNA: NOT DETECTED

## 2017-01-27 LAB — RPR

## 2017-01-27 LAB — HEPATITIS B SURFACE ANTIGEN: HEP B S AG: NEGATIVE

## 2017-01-27 LAB — HEPATITIS C ANTIBODY: HCV Ab: NEGATIVE

## 2017-01-27 LAB — HIV ANTIBODY (ROUTINE TESTING W REFLEX): HIV 1&2 Ab, 4th Generation: NONREACTIVE

## 2017-01-31 ENCOUNTER — Ambulatory Visit: Payer: 59 | Admitting: Gynecology

## 2017-02-02 ENCOUNTER — Ambulatory Visit (INDEPENDENT_AMBULATORY_CARE_PROVIDER_SITE_OTHER): Payer: 59 | Admitting: Gynecology

## 2017-02-02 ENCOUNTER — Encounter: Payer: Self-pay | Admitting: Gynecology

## 2017-02-02 VITALS — BP 112/64

## 2017-02-02 DIAGNOSIS — N3001 Acute cystitis with hematuria: Secondary | ICD-10-CM

## 2017-02-02 LAB — URINALYSIS W MICROSCOPIC + REFLEX CULTURE
Bilirubin Urine: NEGATIVE
CASTS: NONE SEEN [LPF]
Crystals: NONE SEEN [HPF]
Glucose, UA: NEGATIVE
Ketones, ur: NEGATIVE
Leukocytes, UA: NEGATIVE
NITRITE: NEGATIVE
PROTEIN: NEGATIVE
SPECIFIC GRAVITY, URINE: 1.02 (ref 1.001–1.035)
Yeast: NONE SEEN [HPF]
pH: 5.5 (ref 5.0–8.0)

## 2017-02-02 MED ORDER — CIPROFLOXACIN HCL 500 MG PO TABS: 500.0000 mg | ORAL_TABLET | Freq: Two times a day (BID) | ORAL | 0 refills | Status: DC

## 2017-02-02 MED ORDER — URIBEL 118 MG PO CAPS: 1.0000 | ORAL_CAPSULE | Freq: Four times a day (QID) | ORAL | 1 refills | Status: DC

## 2017-02-02 MED ORDER — TRAMADOL HCL 50 MG PO TABS: 50.0000 mg | ORAL_TABLET | Freq: Four times a day (QID) | ORAL | 1 refills | Status: DC | PRN

## 2017-02-02 NOTE — Addendum Note (Signed)
Addended by: Nelva Nay on: 02/02/2017 03:39 PM   Modules accepted: Orders

## 2017-02-02 NOTE — Patient Instructions (Signed)
Follow up if your urinary symptoms continue

## 2017-02-02 NOTE — Progress Notes (Signed)
    [name] 1971/07/06 [id number]        46 y.o.  [id number] with a history of relatively acute onset of frequency, dysuria and blood in her urine. Was out of town and was started on ciprofloxacin 500 mg twice a day 5 days. She is in today 3 of the antibiotics and feeling better. No fever or chills. Some mild dysuria persists. The low back pain, urgency, nausea/vomiting, diarrhea or constipation. Also has a history of interstitial cystitis. Started taking Uribel that she had at home and tramadol for discomfort.  Past medical history,surgical history, problem list, medications, allergies, family history and social history were all reviewed and documented in the EPIC chart.  Directed ROS with pertinent positives and negatives documented in the history of present illness/assessment and plan.  Exam: Vitals:   02/02/17 1415  BP: 112/64   General appearance:  Normal Spine straight without CVA tenderness Abdomen soft nontender without masses guarding rebound  Assessment/Plan:  46 y.o. [id number] With history consistent with acute hemorrhagic cystitis. Urinalysis today shows few bacteria 0-2 RBCs. She is traveling out of the state over the next day or 2. I gave her a prescription for ciprofloxacin 500 mg #10 one by mouth twice a day to take with her in the event that she does not feel totally clear after she leaves she can fill this in the other city and completed a 7-10 day course. I also refilled Uribel 120 one by mouth every 6 hour as needed no refill for her and tramadol 50 mg #60 one refill as she is fearful that this could trigger a interstitial cystitis attack and would like to have this medication available if needed.    Anastasio Auerbach MD, 2:32 PM 02/02/2017

## 2017-02-03 LAB — URINE CULTURE

## 2017-02-18 ENCOUNTER — Ambulatory Visit (INDEPENDENT_AMBULATORY_CARE_PROVIDER_SITE_OTHER): Payer: 59 | Admitting: Gynecology

## 2017-02-18 ENCOUNTER — Encounter: Payer: Self-pay | Admitting: Gynecology

## 2017-02-18 VITALS — BP 120/70

## 2017-02-18 DIAGNOSIS — R300 Dysuria: Secondary | ICD-10-CM

## 2017-02-18 LAB — URINALYSIS W MICROSCOPIC + REFLEX CULTURE
Bacteria, UA: NONE SEEN [HPF]
Bilirubin Urine: NEGATIVE
Casts: NONE SEEN [LPF]
Crystals: NONE SEEN [HPF]
GLUCOSE, UA: NEGATIVE
Hgb urine dipstick: NEGATIVE
KETONES UR: NEGATIVE
LEUKOCYTES UA: NEGATIVE
NITRITE: NEGATIVE
PH: 7 (ref 5.0–8.0)
Protein, ur: NEGATIVE
RBC / HPF: NONE SEEN RBC/HPF (ref <=2)
SPECIFIC GRAVITY, URINE: 1.02 (ref 1.001–1.035)
YEAST: NONE SEEN [HPF]

## 2017-02-18 NOTE — Patient Instructions (Signed)
The office will call if your urine culture grows out any bacteria. Otherwise follow up with Dr. Amalia Hailey for evaluation of your interstitial cystitis.

## 2017-02-18 NOTE — Progress Notes (Signed)
    [name] [birth year] [id number]        46 y.o.  [id number] presents complaining of persistent dysuria. Was evaluated earlier this month with UTI symptoms and treated with ciprofloxacin. Symptoms a little bit better but still persisting. She has a history of interstitial cystitis and she is unsure whether she is having an episode now to account for her symptoms. No fever or chills. No nausea vomiting.  Past medical history,surgical history, problem list, medications, allergies, family history and social history were all reviewed and documented in the EPIC chart.  Directed ROS with pertinent positives and negatives documented in the history of present illness/assessment and plan.  Exam: Vitals:   02/18/17 1201  BP: 120/70   General appearance:  Normal Spine straight without CVA tenderness Abdomen soft nontender without masses guarding rebound  Assessment/Plan:  46 y.o. [id number] with history as above. Exam is negative. Urinalysis is unremarkable showing 0-5 WBC 6-10 squamous cellssignificant bacteria. Will culture for completeness but this point I think her symptoms are due to her interstitial cystitis. Patient is making an appointment to see Dr. Amalia Hailey who follows her for her interstitial cystitis. She will follow up with him and in the interim if her urine does grow bacteria we'll treat her.    Anastasio Auerbach MD, 12:16 PM 02/18/2017

## 2017-02-19 LAB — URINE CULTURE: Organism ID, Bacteria: NO GROWTH

## 2017-02-21 ENCOUNTER — Encounter: Payer: Self-pay | Admitting: Gynecology

## 2017-04-07 ENCOUNTER — Ambulatory Visit: Payer: 59 | Admitting: Family Medicine

## 2017-07-18 ENCOUNTER — Ambulatory Visit (INDEPENDENT_AMBULATORY_CARE_PROVIDER_SITE_OTHER): Payer: 59 | Admitting: Gynecology

## 2017-07-18 ENCOUNTER — Encounter: Payer: Self-pay | Admitting: Gynecology

## 2017-07-18 VITALS — BP 116/74

## 2017-07-18 DIAGNOSIS — Z113 Encounter for screening for infections with a predominantly sexual mode of transmission: Secondary | ICD-10-CM | POA: Diagnosis not present

## 2017-07-18 NOTE — Progress Notes (Signed)
    Akeila Mcenery 05-14-1971 [id number]        46 y.o.  [id number] presents for follow up STD screening.  Currently not sexually active. Wanted to be tested to be assured no seroconversion from last exposure months ago.  Past medical history,surgical history, problem list, medications, allergies, family history and social history were all reviewed and documented in the EPIC chart.  Directed ROS with pertinent positives and negatives documented in the history of present illness/assessment and plan.  Exam: [name] assistant Vitals:   07/18/17 0819  BP: 116/74   General appearance:  Normal Abdomen soft nontender without masses guarding rebound Pelvic external BUS vagina normal. Cervix normal. GC/Chlamydia screen done. Uterus normal size midline mobile nontender. Adnexa without masses or tenderness.  Assessment/Plan:  46 y.o. [id number] for STD screening. Asymptomatic with normal exam. GC/Chlamydia, HIV, RPR, hepatitis B, hepatitis C screens done. Follow up for results. Follow up for annual exam in February when due.    Anastasio Auerbach MD, 8:33 AM 07/18/2017

## 2017-07-18 NOTE — Patient Instructions (Signed)
Office will call you with test results

## 2017-07-18 NOTE — Addendum Note (Signed)
Addended by: Nelva Nay on: 07/18/2017 09:19 AM   Modules accepted: Orders

## 2017-07-19 ENCOUNTER — Encounter: Payer: Self-pay | Admitting: Gynecology

## 2017-07-19 LAB — HEPATITIS C ANTIBODY
HEP C AB: NONREACTIVE
SIGNAL TO CUT-OFF: 0.01 (ref <1.00)

## 2017-07-19 LAB — C. TRACHOMATIS/N. GONORRHOEAE RNA
C. trachomatis RNA, TMA: NOT DETECTED
N. GONORRHOEAE RNA, TMA: NOT DETECTED

## 2017-07-19 LAB — RPR: RPR Ser Ql: NONREACTIVE

## 2017-07-19 LAB — HIV ANTIBODY (ROUTINE TESTING W REFLEX): HIV 1&2 Ab, 4th Generation: NONREACTIVE

## 2017-07-19 LAB — HEPATITIS B SURFACE ANTIGEN: Hepatitis B Surface Ag: NONREACTIVE

## 2017-07-25 ENCOUNTER — Ambulatory Visit (INDEPENDENT_AMBULATORY_CARE_PROVIDER_SITE_OTHER): Payer: 59 | Admitting: Gynecology

## 2017-07-25 ENCOUNTER — Encounter: Payer: Self-pay | Admitting: Gynecology

## 2017-07-25 VITALS — BP 116/72

## 2017-07-25 DIAGNOSIS — Z23 Encounter for immunization: Secondary | ICD-10-CM | POA: Diagnosis not present

## 2017-07-25 DIAGNOSIS — N920 Excessive and frequent menstruation with regular cycle: Secondary | ICD-10-CM

## 2017-07-25 DIAGNOSIS — N898 Other specified noninflammatory disorders of vagina: Secondary | ICD-10-CM | POA: Diagnosis not present

## 2017-07-25 DIAGNOSIS — R350 Frequency of micturition: Secondary | ICD-10-CM | POA: Diagnosis not present

## 2017-07-25 DIAGNOSIS — N939 Abnormal uterine and vaginal bleeding, unspecified: Secondary | ICD-10-CM

## 2017-07-25 LAB — WET PREP FOR TRICH, YEAST, CLUE

## 2017-07-25 MED ORDER — FLUCONAZOLE 150 MG PO TABS: 150.0000 mg | ORAL_TABLET | Freq: Once | ORAL | 0 refills | Status: AC

## 2017-07-25 NOTE — Patient Instructions (Signed)
Take the Diflucan pill. Call if spotting continues.  Office will call if the urine grows out any bacteria.

## 2017-07-25 NOTE — Progress Notes (Signed)
    [name] December 30, 1970 [id number]        46 y.o.  [id number] presents complaining of brownish spotting noted today. No history of same before. Also noting some urinary frequency although she does have a history of interstitial cystitis and is unsure whether this is related to that. No dysuria or urgency low back pain fever or chills. History of premature menopause on estradiol 0.5 mg and Prometrium 100 mg nightly.  Had spotting after intercourse beginning of this year and ultimately had a sonohysterogram in February which showed endometrial echo 3.7 mm, negative endometrial cavity and the biopsy showing fragments of benign endometrial tissue   Past medical history,surgical history, problem list, medications, allergies, family history and social history were all reviewed and documented in the EPIC chart.  Directed ROS with pertinent positives and negatives documented in the history of present illness/assessment and plan.  Exam: [name] assistant Vitals:   07/25/17 1555  BP: 116/72   General appearance: Abdomen soft nontender without masses guarding rebound Pelvic external BUS vagina with slight brownish discharge. Cervix normal. Uterus normal size midline mobile nontender. Adnexa without masses or tenderness.   Assessment/Plan:  46 y.o. [id number] with:  1. Brownish discharge today. Wet prep is unremarkable.  Will cover with Diflucan 150 mg 1 dose for subclinical yeast. Reviewed post menopausal spotting on HRT with the patient. Recent 12/2016 evaluation to include a sonohysterogram or biopsy reviewed. At this point recommend follow to see if she does any further staining. It becomes a recurrent problem then reviewed repeating the ultrasound to relook at the endometrial cavity. Possible adjustments in her HRT dosage also reviewed. 2. Urinary frequency. Urinalysis shows few bacteria is otherwise unremarkable. Will culture and treat if grows out bacteria. If does not grow out bacteria and I  suspect this is more related to her interstitial cystitis and she'll follow up with her urologist if this continues to be an issue.    Anastasio Auerbach MD, 4:45 PM 07/25/2017

## 2017-07-26 LAB — URINALYSIS W MICROSCOPIC + REFLEX CULTURE
BILIRUBIN URINE: NEGATIVE
Glucose, UA: NEGATIVE
Hyaline Cast: NONE SEEN /LPF
KETONES UR: NEGATIVE
LEUKOCYTE ESTERASE: NEGATIVE
Nitrites, Initial: NEGATIVE
PROTEIN: NEGATIVE
SPECIFIC GRAVITY, URINE: 1.025 (ref 1.001–1.03)
pH: 6 (ref 5.0–8.0)

## 2017-07-26 LAB — URINE CULTURE
MICRO NUMBER: 81054151
Result:: NO GROWTH
SPECIMEN QUALITY: ADEQUATE

## 2017-07-26 LAB — NO CULTURE INDICATED

## 2017-07-27 ENCOUNTER — Encounter: Payer: Self-pay | Admitting: Gynecology

## 2017-08-01 ENCOUNTER — Other Ambulatory Visit: Payer: Self-pay | Admitting: Gynecology

## 2017-08-01 DIAGNOSIS — Z1231 Encounter for screening mammogram for malignant neoplasm of breast: Secondary | ICD-10-CM

## 2017-08-11 ENCOUNTER — Ambulatory Visit: Payer: 59

## 2017-08-16 ENCOUNTER — Ambulatory Visit (INDEPENDENT_AMBULATORY_CARE_PROVIDER_SITE_OTHER): Payer: 59 | Admitting: Medical

## 2017-08-16 VITALS — BP 96/62 | HR 54 | Temp 97.5°F | Resp 16 | Ht 61.0 in | Wt 123.6 lb

## 2017-08-16 DIAGNOSIS — R350 Frequency of micturition: Secondary | ICD-10-CM

## 2017-08-16 LAB — POC URINALSYSI DIPSTICK (AUTOMATED)
BILIRUBIN UA: NEGATIVE
Blood, UA: NEGATIVE
GLUCOSE UA: NEGATIVE
LEUKOCYTES UA: NEGATIVE
Nitrite, UA: NEGATIVE
PROTEIN UA: NEGATIVE
Spec Grav, UA: 1.02 (ref 1.010–1.025)
Urobilinogen, UA: NEGATIVE E.U./dL — AB
pH, UA: 6 (ref 5.0–8.0)

## 2017-08-16 MED ORDER — NITROFURANTOIN MONOHYD MACRO 100 MG PO CAPS: 100.0000 mg | ORAL_CAPSULE | Freq: Two times a day (BID) | ORAL | 0 refills | Status: DC

## 2017-08-16 MED ORDER — FLUCONAZOLE 150 MG PO TABS: 150.0000 mg | ORAL_TABLET | Freq: Once | ORAL | 0 refills | Status: AC

## 2017-08-16 NOTE — Patient Instructions (Addendum)
Your recent frequent urination may indicate flare of your interstitial cystitis versus urinary tract infection.  Presently recommend continuing your interstitial cystitis medication regime while we await your urine culture results. In addition hydrate well.  I am printing a prescription of Macrobid that you could fill in the event more classic urinary infection symptoms occur pending urine culture. Also if your culture does show UTI we will let you know and typically Macrobid has good bacterial coverage.  Follow-up 10-14 days or as needed.

## 2017-08-16 NOTE — Progress Notes (Signed)
Subjective:    Patient ID: [name], female    DOB: 1971-09-13, 46 y.o.   MRN: [id number]  HPI  Pt has frequent urination for one week(mild irritation but no definite pain). Hx of IC and gets some occasional uti.  No fever, no chills, no sweats, no nausea or vomiting. No obvious burning. No report of any vaginal dc.   No back pain.  LMP- pt early menopause. No menses for 2 years.  Pt is on Elmiron and hydroxyzine. Some occasional tramadol.  She does state that typically when she has mild urinary symptoms she needs to determine if she has UTI or if potential interstitial cystitis flare.    Review of Systems  Constitutional: Negative for chills, fatigue and fever.  HENT: Negative for congestion, ear pain, nosebleeds, sinus pressure and sneezing.   Respiratory: Negative for cough, chest tightness, shortness of breath and wheezing.   Cardiovascular: Negative for chest pain and palpitations.  Gastrointestinal: Negative for abdominal pain, diarrhea and nausea.  Genitourinary: Positive for frequency. Negative for difficulty urinating, dysuria, pelvic pain and urgency.  Musculoskeletal: Negative for back pain.  Skin: Negative for rash.  Neurological: Negative for dizziness and headaches.  Hematological: Negative for adenopathy. Does not bruise/bleed easily.  Psychiatric/Behavioral: Negative for behavioral problems and confusion.   Past Medical History:  Diagnosis Date  . Alopecia   . Anxiety and depression 07/31/2011  . Chest pain 09/22/10   Sharp pains in left breast & sometimes in the right breast as well. Random in occurence & nonexertional. Also has occasional SOB & DOE when going up stairs & occasionally when working out. Nuclear Stress Treadmill 09/29/10 -   . HSV-2 (herpes simplex virus 2) infection   . IBS (irritable bowel syndrome)   . Interstitial cystitis   . Multiple allergies 07/02/2011  . Otitis media 03/16/2012  . Perimenopausal 07/22/2013  . PONV  (postoperative nausea and vomiting)   . Poor concentration 07/02/2011  . Shoulder pain, left 04/11/2012  . SOB (shortness of breath) 09/22/10   ECHO 09/29/10 - NL LVF, trivial PE, trivial TR/PR.  Marland Kitchen UTI (lower urinary tract infection) 08/28/2011     Social History   Social History  . Marital status: Married    Spouse name: N/A  . Number of children: N/A  . Years of education: N/A   Occupational History  . Not on file.   Social History Main Topics  . Smoking status: Never Smoker  . Smokeless tobacco: Never Used  . Alcohol use 0.0 oz/week     Comment: RARELY  . Drug use: No  . Sexual activity: Yes    Birth control/ protection: Surgical     Comment: Tubal lig-1st intercourse 46 yo-More than 5 partners   Other Topics Concern  . Not on file   Social History Narrative  . No narrative on file    Past Surgical History:  Procedure Laterality Date  . BREAST BIOPSY Right 03/12/2016   Procedure: breast exploration;  Surgeon: Jackolyn Confer, MD;  Location: [facility];  Service: General;  Laterality: Right;  . BREAST ENHANCEMENT SURGERY  2007  . CESAREAN SECTION  2001 and 2004   x2  . LAPAROSCOPY     twice  . RHINOPLASTY  2003  . TONSILLECTOMY  1982  . TUBAL LIGATION      Family History  Problem Relation Age of Onset  . Heart disease Mother        MVP  . Anxiety disorder Mother   .  Mitral valve prolapse Mother   . Hypertension Brother   . Obesity Brother   . Diabetes Brother        DM, obese  . Allergies Daughter   . Allergies Son   . Kidney disease Maternal Grandmother        uremia    No Known Allergies  Current Outpatient Prescriptions on File Prior to Visit  Medication Sig Dispense Refill  . ALPRAZolam (XANAX) 0.5 MG tablet Take 1 tablet (0.5 mg total) by mouth 2 (two) times daily as needed for anxiety. 40 tablet 2  . amphetamine-dextroamphetamine (ADDERALL) 20 MG tablet Take 1 tablet (20 mg total) by mouth 2 (two) times daily. January 2018 60  tablet 0  . aspirin 81 MG tablet Take 81 mg by mouth daily.      . Biotin 2500 MCG CAPS Take 1 capsule by mouth daily.      . Calcium Citrate-Vitamin D 250-200 MG-UNIT TABS Take 1 tablet by mouth daily. 30 each 11  . Cholecalciferol (VITAMIN D) 2000 UNITS CAPS Take 1 capsule (2,000 Units total) by mouth once. 30 capsule 11  . estradiol (ESTRACE) 0.5 MG tablet Take 1 tablet (0.5 mg total) by mouth daily. 30 tablet 11  . fexofenadine (ALLEGRA) 180 MG tablet Take 180 mg by mouth daily.      . finasteride (PROSCAR) 5 MG tablet Take 5 mg by mouth daily. Take 1/2 tab daily    . fish oil-omega-3 fatty acids 1000 MG capsule Take 2 g by mouth daily.     Marland Kitchen HYDROCODONE-IBUPROFEN PO Take by mouth.    . iron polysaccharides (POLY-IRON 150) 150 MG capsule Take 1 capsule (150 mg total) by mouth 2 (two) times daily. 60 capsule 6  . Meth-Hyo-M Bl-Na Phos-Ph Sal (URIBEL) 118 MG CAPS Take 1 capsule (118 mg total) by mouth 4 (four) times daily. As needed for bladder pain 120 capsule 1  . mometasone (NASONEX) 50 MCG/ACT nasal spray Place 2 sprays into the nose daily. 17 g 6  . Multiple Vitamin (MULTIVITAMIN) capsule Take 1 capsule by mouth daily.      . progesterone (PROMETRIUM) 100 MG capsule Take 1 capsule (100 mg total) by mouth at bedtime. 30 capsule 11  . traMADol (ULTRAM) 50 MG tablet Take 1 tablet (50 mg total) by mouth every 6 (six) hours as needed. 60 tablet 1  . tretinoin (RETIN-A) 0.025 % cream     . valACYclovir (VALTREX) 500 MG tablet TAKE 1 TABLET(500 MG) BY MOUTH DAILY 30 tablet 0  . zinc gluconate 50 MG tablet Take 50 mg by mouth daily.     No current facility-administered medications on file prior to visit.     BP 96/62   Pulse (!) 54   Temp (!) 97.5 F (36.4 C) (Oral)   Resp 16   Ht 5\' 1"  (1.549 m)   Wt 123 lb 9.6 oz (56.1 kg)   LMP 07/03/2015   SpO2 100%   BMI 23.35 kg/m      Objective:   Physical Exam  General Appearance- Not in acute distress.  HEENT Eyes-  Scleraeral/Conjuntiva-bilat- Not Yellow. Mouth & Throat- Normal.  Chest and Lung Exam Auscultation: Breath sounds:-Normal. Adventitious sounds:- No Adventitious sounds.  Cardiovascular Auscultation:Rythm - Regular. Heart Sounds -Normal heart sounds.  Abdomen Inspection:-Inspection Normal.  Palpation/Perucssion: Palpation and Percussion of the abdomen reveal- Non Tender, No Rebound tenderness, No rigidity(Guarding) and No Palpable abdominal masses.  Liver:-Normal.  Spleen:- Normal.  No suprapubic tenderness. Back- no cva  tenderness.        Assessment & Plan:  Your recent frequent urination may indicate flare of your interstitial cystitis versus urinary tract infection.  Presently recommend continuing your interstitial cystitis medication regime while we await your urine culture results. In addition hydrate well.  I am printing a prescription of Macrobid that you could fill in the event more classic urinary infection symptoms occur pending urine culture. Also if your culture does show UTI we will let you know and typically Macrobid has good bacterial coverage.  Follow-up 10-14 days or as needed.

## 2017-08-17 LAB — URINE CULTURE
MICRO NUMBER:: 81152778
RESULT: NO GROWTH
SPECIMEN QUALITY:: ADEQUATE

## 2017-08-31 ENCOUNTER — Ambulatory Visit
Admission: RE | Admit: 2017-08-31 | Discharge: 2017-08-31 | Disposition: A | Discharge status: Home / Self Care | Payer: Self-pay | Source: Ambulatory Visit | Attending: Gynecology | Admitting: Gynecology

## 2017-08-31 ENCOUNTER — Other Ambulatory Visit: Payer: Self-pay | Admitting: Gynecology

## 2017-08-31 ENCOUNTER — Ambulatory Visit
Admission: RE | Admit: 2017-08-31 | Discharge: 2017-08-31 | Disposition: A | Discharge status: Home / Self Care | Payer: 59 | Source: Ambulatory Visit | Attending: Gynecology | Admitting: Gynecology

## 2017-08-31 DIAGNOSIS — Z1231 Encounter for screening mammogram for malignant neoplasm of breast: Secondary | ICD-10-CM

## 2017-09-24 ENCOUNTER — Other Ambulatory Visit: Payer: Self-pay | Admitting: Gynecology

## 2017-12-13 ENCOUNTER — Ambulatory Visit: Payer: 59 | Admitting: Gynecology

## 2017-12-13 ENCOUNTER — Encounter: Payer: Self-pay | Admitting: Gynecology

## 2017-12-13 VITALS — BP 114/66

## 2017-12-13 DIAGNOSIS — R300 Dysuria: Secondary | ICD-10-CM

## 2017-12-13 DIAGNOSIS — N3010 Interstitial cystitis (chronic) without hematuria: Secondary | ICD-10-CM

## 2017-12-13 NOTE — Patient Instructions (Signed)
Follow-up with your urologist in reference to the interstitial cystitis

## 2017-12-13 NOTE — Progress Notes (Signed)
    Hiilani Fenstermacher Jun 03, 1971 [id number]        47 y.o.  [id number] presents with several weeks of discomfort with urination.  She has a history of interstitial cystitis and is unsure whether it is her interstitial cystitis or she has a UTI.  No urgency frequency low back pain fever or chills.  No nausea vomiting diarrhea constipation.  Past medical history,surgical history, problem list, medications, allergies, family history and social history were all reviewed and documented in the EPIC chart.  Directed ROS with pertinent positives and negatives documented in the history of present illness/assessment and plan.  Exam: Vitals:   12/13/17 1046  BP: 114/66   General appearance:  Normal Abdomen soft nontender without masses guarding rebound  Urine analysis with no WBC, no RBC, 6-10 squamous, no bacteria  Assessment/Plan:  47 y.o. [id number] with dysuria and no other symptoms.  Urine analysis is negative.  Suspect exacerbation of her interstitial cystitis.  She had been on Elmeron in the past but discontinued.  She is going to follow-up with her urologist in reference to treatment and further evaluation as needed.  Greater than 50% of my time was spent in direct face to face counseling and coordination of care with the patient.      Anastasio Auerbach MD, 11:34 AM 12/13/2017

## 2017-12-14 LAB — URINALYSIS, COMPLETE W/RFL CULTURE
BILIRUBIN URINE: NEGATIVE
Bacteria, UA: NONE SEEN /HPF
GLUCOSE, UA: NEGATIVE
HGB URINE DIPSTICK: NEGATIVE
Hyaline Cast: NONE SEEN /LPF
KETONES UR: NEGATIVE
LEUKOCYTE ESTERASE: NEGATIVE
NITRITES URINE, INITIAL: NEGATIVE
PH: 5.5 (ref 5.0–8.0)
Protein, ur: NEGATIVE
RBC / HPF: NONE SEEN /HPF (ref 0–2)
Specific Gravity, Urine: 1.015 (ref 1.001–1.03)
WBC UA: NONE SEEN /HPF (ref 0–5)

## 2017-12-14 LAB — NO CULTURE INDICATED

## 2017-12-22 ENCOUNTER — Encounter: Payer: Self-pay | Admitting: Gynecology

## 2017-12-22 ENCOUNTER — Ambulatory Visit (INDEPENDENT_AMBULATORY_CARE_PROVIDER_SITE_OTHER): Payer: 59 | Admitting: Gynecology

## 2017-12-22 VITALS — BP 116/72 | Ht 65.0 in | Wt 124.0 lb

## 2017-12-22 DIAGNOSIS — Z13220 Encounter for screening for lipoid disorders: Secondary | ICD-10-CM | POA: Diagnosis not present

## 2017-12-22 DIAGNOSIS — A609 Anogenital herpesviral infection, unspecified: Secondary | ICD-10-CM

## 2017-12-22 DIAGNOSIS — Z79890 Hormone replacement therapy (postmenopausal): Secondary | ICD-10-CM

## 2017-12-22 DIAGNOSIS — Z01419 Encounter for gynecological examination (general) (routine) without abnormal findings: Secondary | ICD-10-CM | POA: Diagnosis not present

## 2017-12-22 DIAGNOSIS — Z113 Encounter for screening for infections with a predominantly sexual mode of transmission: Secondary | ICD-10-CM | POA: Diagnosis not present

## 2017-12-22 DIAGNOSIS — L659 Nonscarring hair loss, unspecified: Secondary | ICD-10-CM

## 2017-12-22 MED ORDER — VALACYCLOVIR HCL 500 MG PO TABS: 500.0000 mg | ORAL_TABLET | Freq: Every day | ORAL | 4 refills | Status: DC

## 2017-12-22 MED ORDER — PROGESTERONE MICRONIZED 100 MG PO CAPS: 100.0000 mg | ORAL_CAPSULE | Freq: Every day | ORAL | 4 refills | Status: DC

## 2017-12-22 MED ORDER — NITROFURANTOIN MONOHYD MACRO 100 MG PO CAPS: 100.0000 mg | ORAL_CAPSULE | Freq: Once | ORAL | 3 refills | Status: AC

## 2017-12-22 MED ORDER — ESTRADIOL 0.5 MG PO TABS: 0.5000 mg | ORAL_TABLET | Freq: Every day | ORAL | 4 refills | Status: DC

## 2017-12-22 NOTE — Patient Instructions (Signed)
Follow-up for fasting blood work.  Follow-up in 1 year for annual exam 

## 2017-12-22 NOTE — Progress Notes (Signed)
    [name] Nov 04, 1970 [id number]        47 y.o.  [id number] for annual gynecologic exam.  Doing well without complaints.  Several issues noted below.  Past medical history,surgical history, problem list, medications, allergies, family history and social history were all reviewed and documented as reviewed in the EPIC chart.  ROS:  Performed with pertinent positives and negatives included in the history, assessment and plan.   Additional significant findings : None   Exam: [name] assistant Vitals:   12/22/17 1208  BP: 116/72  Weight: 124 lb (56.2 kg)  Height: 5\' 5"  (1.651 m)   Body mass index is 20.63 kg/m.  General appearance:  Normal affect, orientation and appearance. Skin: Grossly normal HEENT: Without gross lesions.  No cervical or supraclavicular adenopathy. Thyroid normal.  Lungs:  Clear without wheezing, rales or rhonchi Cardiac: RR, without RMG Abdominal:  Soft, nontender, without masses, guarding, rebound, organomegaly or hernia Breasts:  Examined lying and sitting without masses, retractions, discharge or axillary adenopathy.  Bilateral implants noted Pelvic:  Ext, BUS, Vagina: Normal  Cervix: Normal.  Pap smear done.  GC/Chlamydia  Uterus: Anteverted, normal size, shape and contour, midline and mobile nontender   Adnexa: Without masses or tenderness    Anus and perineum: Normal   Rectovaginal: Normal sphincter tone without palpated masses or tenderness.    Assessment/Plan:  47 y.o. [id number] female for annual gynecologic exam.   1. History of premature menopause.  Continues on estradiol 0.5 mg and Prometrium 100 mg daily.  Feels good on this and wants to continue.  We have discussed the risks to include thrombosis such as stroke heart attack DVT in the breast cancer issue.  She is comfortable continuing and I refilled her times 1 year. 2. Pap smear 2018.  Pap smear/HPV 2015.  Pap smear done today.  Patient uncomfortable with less frequent screening  guidelines and requests annual cytology.  No history of significant abnormal Pap smears previously. 3. STD screening.  Patient requests STD screening.  No known exposures.  GC/Chlamydia, HIV, RPR, hepatitis B, hepatitis C. 4. 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  Requests iron studies and potassium check by her dermatologist.  Iron and ferritin ordered and will check electrolytes with her comprehensive metabolic panel. 5. History of HSV taking Valtrex 500 mg daily.  Refill times 1 year provided. 6. History of postcoital UTIs.  Uses Macrobid 100 mg with intercourse.  #30 with 3 refills provided. 7. Mammography 12/2016.  Continue with annual mammography now and patient will schedule in follow-up for this.  Breast exam normal today. 8. Health maintenance.  Baseline CBC, CMP, lipid profile ordered along with her iron studies and STD screening.  Follow-up in 1 year, sooner as needed.   Anastasio Auerbach MD, 12:52 PM 12/22/2017

## 2017-12-23 ENCOUNTER — Other Ambulatory Visit: Payer: 59

## 2017-12-23 DIAGNOSIS — Z13220 Encounter for screening for lipoid disorders: Secondary | ICD-10-CM

## 2017-12-23 DIAGNOSIS — Z113 Encounter for screening for infections with a predominantly sexual mode of transmission: Secondary | ICD-10-CM

## 2017-12-23 DIAGNOSIS — Z01419 Encounter for gynecological examination (general) (routine) without abnormal findings: Secondary | ICD-10-CM

## 2017-12-23 DIAGNOSIS — L659 Nonscarring hair loss, unspecified: Secondary | ICD-10-CM

## 2017-12-23 LAB — C. TRACHOMATIS/N. GONORRHOEAE RNA
C. TRACHOMATIS RNA, TMA: NOT DETECTED
N. GONORRHOEAE RNA, TMA: NOT DETECTED

## 2017-12-26 LAB — CBC WITH DIFFERENTIAL/PLATELET
BASOS ABS: 32 {cells}/uL (ref 0–200)
Basophils Relative: 0.6 %
EOS PCT: 0.6 %
Eosinophils Absolute: 32 cells/uL (ref 15–500)
HEMATOCRIT: 43.2 % (ref 35.0–45.0)
Hemoglobin: 14.5 g/dL (ref 11.7–15.5)
LYMPHS ABS: 1473 {cells}/uL (ref 850–3900)
MCH: 29.7 pg (ref 27.0–33.0)
MCHC: 33.6 g/dL (ref 32.0–36.0)
MCV: 88.5 fL (ref 80.0–100.0)
MONOS PCT: 6.2 %
MPV: 11.4 fL (ref 7.5–12.5)
NEUTROS PCT: 64.8 %
Neutro Abs: 3434 cells/uL (ref 1500–7800)
Platelets: 223 10*3/uL (ref 140–400)
RBC: 4.88 10*6/uL (ref 3.80–5.10)
RDW: 12 % (ref 11.0–15.0)
Total Lymphocyte: 27.8 %
WBC mixed population: 329 cells/uL (ref 200–950)
WBC: 5.3 10*3/uL (ref 3.8–10.8)

## 2017-12-26 LAB — LIPID PANEL
CHOLESTEROL: 204 mg/dL — AB (ref <200)
HDL: 60 mg/dL (ref >50)
LDL Cholesterol (Calc): 126 mg/dL (calc) — ABNORMAL HIGH
Non-HDL Cholesterol (Calc): 144 mg/dL (calc) — ABNORMAL HIGH (ref <130)
Total CHOL/HDL Ratio: 3.4 (calc) (ref <5.0)
Triglycerides: 81 mg/dL (ref <150)

## 2017-12-26 LAB — COMPREHENSIVE METABOLIC PANEL
AG RATIO: 2.1 (calc) (ref 1.0–2.5)
ALT: 16 U/L (ref 6–29)
AST: 14 U/L (ref 10–35)
Albumin: 5 g/dL (ref 3.6–5.1)
Alkaline phosphatase (APISO): 53 U/L (ref 33–115)
BUN: 22 mg/dL (ref 7–25)
CHLORIDE: 105 mmol/L (ref 98–110)
CO2: 25 mmol/L (ref 20–32)
CREATININE: 0.79 mg/dL (ref 0.50–1.10)
Calcium: 9.8 mg/dL (ref 8.6–10.2)
GLOBULIN: 2.4 g/dL (ref 1.9–3.7)
GLUCOSE: 97 mg/dL (ref 65–99)
Potassium: 4.6 mmol/L (ref 3.5–5.3)
SODIUM: 139 mmol/L (ref 135–146)
TOTAL PROTEIN: 7.4 g/dL (ref 6.1–8.1)
Total Bilirubin: 0.9 mg/dL (ref 0.2–1.2)

## 2017-12-26 LAB — FERRITIN: FERRITIN: 60 ng/mL (ref 10–232)

## 2017-12-26 LAB — HEPATITIS B SURFACE ANTIGEN: HEP B S AG: NONREACTIVE

## 2017-12-26 LAB — RPR: RPR Ser Ql: NONREACTIVE

## 2017-12-26 LAB — TSH: TSH: 1.52 mIU/L

## 2017-12-26 LAB — HIV ANTIBODY (ROUTINE TESTING W REFLEX): HIV 1&2 Ab, 4th Generation: NONREACTIVE

## 2017-12-26 LAB — HEPATITIS C ANTIBODY
Hepatitis C Ab: NONREACTIVE
SIGNAL TO CUT-OFF: 0.01 (ref <1.00)

## 2017-12-26 LAB — IRON: IRON: 164 ug/dL (ref 40–190)

## 2017-12-27 LAB — PAP IG AND HPV HIGH-RISK: HPV DNA High Risk: NOT DETECTED

## 2018-02-18 ENCOUNTER — Encounter: Payer: Self-pay | Admitting: Family Medicine

## 2018-02-18 ENCOUNTER — Ambulatory Visit (INDEPENDENT_AMBULATORY_CARE_PROVIDER_SITE_OTHER): Payer: BLUE CROSS/BLUE SHIELD | Admitting: Family Medicine

## 2018-02-18 VITALS — BP 118/74 | HR 68 | Temp 97.9°F | Ht 65.0 in | Wt 124.0 lb

## 2018-02-18 DIAGNOSIS — R21 Rash and other nonspecific skin eruption: Secondary | ICD-10-CM

## 2018-02-18 MED ORDER — CLOTRIMAZOLE 1 % EX CREA: 1.0000 "application " | TOPICAL_CREAM | Freq: Two times a day (BID) | CUTANEOUS | 0 refills | Status: DC

## 2018-02-18 NOTE — Patient Instructions (Signed)
Please try the cream to rub onto the area.  Please try this for 10-14 days and then follow up if there is no improvement We will call you with the results today

## 2018-02-18 NOTE — Assessment & Plan Note (Addendum)
Rash appears to be tinea in nature. She has concerns for STI. No new sexual partners. No vaginal discharge. No ulceration or inguinal LAD  -Clotrimazole - RPR, HIV, GC, chlamydia  - given indications to follow up.

## 2018-02-18 NOTE — Progress Notes (Signed)
[name] - 47 y.o. female MRN [id number]  Date of birth: [birth year]  SUBJECTIVE:  Including CC & ROS.  Chief Complaint  Patient presents with  . Rash    [name] is a 47 y.o. female that is presenting with a rash. Ongoing for two weeks. Located in between her upper thighs. Denies fevers. Describes the rash as brown reddish in color. Denies recent medication changes. She has not put anything on the rash. Denies itching or pain. Denies fevers.    Review of Systems  Constitutional: Negative for fever.  Musculoskeletal: Negative for arthralgias.  Skin: Positive for rash.    HISTORY: Past Medical, Surgical, Social, and Family History Reviewed & Updated per EMR.   Pertinent Historical Findings include:  Past Medical History:  Diagnosis Date  . Alopecia   . Anxiety and depression 07/31/2011  . Chest pain 09/22/10   Sharp pains in left breast & sometimes in the right breast as well. Random in occurence & nonexertional. Also has occasional SOB & DOE when going up stairs & occasionally when working out. Nuclear Stress Treadmill 09/29/10 -   . HSV-2 (herpes simplex virus 2) infection   . IBS (irritable bowel syndrome)   . Interstitial cystitis   . Multiple allergies 07/02/2011  . Otitis media 03/16/2012  . Perimenopausal 07/22/2013  . PONV (postoperative nausea and vomiting)   . Poor concentration 07/02/2011  . Shoulder pain, left 04/11/2012  . SOB (shortness of breath) 09/22/10   ECHO 09/29/10 - NL LVF, trivial PE, trivial TR/PR.  Marland Kitchen UTI (lower urinary tract infection) 08/28/2011    Past Surgical History:  Procedure Laterality Date  . BREAST BIOPSY Right 03/12/2016   Procedure: breast exploration;  Surgeon: Jackolyn Confer, MD;  Location: [facility];  Service: General;  Laterality: Right;  . BREAST ENHANCEMENT SURGERY  2007  . CESAREAN SECTION  2001 and 2004   x2  . LAPAROSCOPY     twice  . RHINOPLASTY  2003  . TONSILLECTOMY  1982  . TUBAL LIGATION       No Known Allergies  Family History  Problem Relation Age of Onset  . Heart disease Mother        MVP  . Anxiety disorder Mother   . Mitral valve prolapse Mother   . Hypertension Brother   . Obesity Brother   . Diabetes Brother        DM, obese  . Allergies Daughter   . Allergies Son   . Kidney disease Maternal Grandmother        uremia  . Breast cancer Neg Hx      Social History   Socioeconomic History  . Marital status: Married    Spouse name: Not on file  . Number of children: Not on file  . Years of education: Not on file  . Highest education level: Not on file  Occupational History  . Not on file  Social Needs  . Financial resource strain: Not on file  . Food insecurity:    Worry: Not on file    Inability: Not on file  . Transportation needs:    Medical: Not on file    Non-medical: Not on file  Tobacco Use  . Smoking status: Never Smoker  . Smokeless tobacco: Never Used  Substance and Sexual Activity  . Alcohol use: Yes    Alcohol/week: 0.0 oz    Comment: RARELY  . Drug use: No  . Sexual activity: Yes    Birth control/protection: Surgical  Comment: Tubal lig-1st intercourse 47 yo-More than 5 partners  Lifestyle  . Physical activity:    Days per week: Not on file    Minutes per session: Not on file  . Stress: Not on file  Relationships  . Social connections:    Talks on phone: Not on file    Gets together: Not on file    Attends religious service: Not on file    Active member of club or organization: Not on file    Attends meetings of clubs or organizations: Not on file    Relationship status: Not on file  . Intimate partner violence:    Fear of current or ex partner: Not on file    Emotionally abused: Not on file    Physically abused: Not on file    Forced sexual activity: Not on file  Other Topics Concern  . Not on file  Social History Narrative  . Not on file     PHYSICAL EXAM:  VS: BP 118/74 (BP Location: Right Arm, Patient  Position: Sitting, Cuff Size: Normal)   Pulse 68   Temp 97.9 F (36.6 C) (Oral)   Ht 5\' 5"  (1.651 m)   Wt 124 lb (56.2 kg)   LMP 07/03/2015   SpO2 98%   BMI 20.63 kg/m  Physical Exam Gen: NAD, alert, cooperative with exam, well-appearing ENT: normal lips, normal nasal mucosa,  Eye: normal EOM, normal conjunctiva and lids CV:  no edema, +2 pedal pulses   Resp: no accessory muscle use, non-labored,  Skin: three rashes present, proximal medial thigh, circular scaly with central clearing, no areas of induration  Neuro: normal tone, normal sensation to touch Psych:  normal insight, alert and oriented MSK: normal gait, normal strength      ASSESSMENT & PLAN:   Rash Rash appears to be tinea in nature. She has concerns for STI. No new sexual partners. No vaginal discharge. No ulceration or inguinal LAD  -Clotrimazole - RPR, HIV, GC, chlamydia  - given indications to follow up.

## 2018-02-20 ENCOUNTER — Ambulatory Visit (INDEPENDENT_AMBULATORY_CARE_PROVIDER_SITE_OTHER): Payer: BLUE CROSS/BLUE SHIELD | Admitting: Gynecology

## 2018-02-20 ENCOUNTER — Encounter: Payer: Self-pay | Admitting: Gynecology

## 2018-02-20 VITALS — BP 104/66

## 2018-02-20 DIAGNOSIS — Z113 Encounter for screening for infections with a predominantly sexual mode of transmission: Secondary | ICD-10-CM

## 2018-02-20 DIAGNOSIS — N898 Other specified noninflammatory disorders of vagina: Secondary | ICD-10-CM

## 2018-02-20 LAB — WET PREP FOR TRICH, YEAST, CLUE

## 2018-02-20 NOTE — Progress Notes (Signed)
    [name] 04-Oct-1971 [id number]        47 y.o.  [id number] presents with recent development of a rash on her thighs.  Saw her primary physician and was treated with clotrimazole for ringworm.  She wanted by second opinion given the unusual nature of this.  Does not have the rash anywhere else on her body but upper inner thighs.  No vaginal discharge or itching/odor.  No urinary symptoms.  Past medical history,surgical history, problem list, medications, allergies, family history and social history were all reviewed and documented in the EPIC chart.  Directed ROS with pertinent positives and negatives documented in the history of present illness/assessment and plan.  Exam: [name] assistant Vitals:   02/20/18 1547  BP: 104/66   General appearance:  Normal Abdomen soft nontender without masses guarding rebound Pelvic external BUS vagina with 2 ring like rashes 1 upper inner right thigh and other upper inner left thigh.  No central clearing but uniformly erythematous throughout the ringlike rash.  No significant vaginal discharge.  Bimanual exam without masses or tenderness.  Assessment/Plan:  47 y.o. [id number] with 2 ringlike rash areas.  No central clearing as more classic for ringworm but reviewed with patient very little else would give this ringlike classic appearance.  She is going to go ahead and use the clotrimazole cream as prescribed over these areas.  Vaginal exam was normal and wet prep was negative for vaginal yeast.  She did ask for STD screening as she has a new partner and a GC/chlamydia cervical screen as well as RPR hepatitis B hepatitis C HIV ordered.  Patient will follow-up for results.    Anastasio Auerbach MD, 4:41 PM 02/20/2018

## 2018-02-20 NOTE — Patient Instructions (Signed)
Apply the prescribed antifungal cream as directed.  Follow-up if the rash continues or spreads.

## 2018-02-21 ENCOUNTER — Encounter: Payer: Self-pay | Admitting: Gynecology

## 2018-02-21 LAB — HEPATITIS C ANTIBODY
HEP C AB: NONREACTIVE
SIGNAL TO CUT-OFF: 0.01 (ref <1.00)

## 2018-02-21 LAB — RPR: RPR Ser Ql: NONREACTIVE

## 2018-02-21 LAB — HEPATITIS B SURFACE ANTIGEN: Hepatitis B Surface Ag: NONREACTIVE

## 2018-02-21 LAB — HIV ANTIBODY (ROUTINE TESTING W REFLEX): HIV 1&2 Ab, 4th Generation: NONREACTIVE

## 2018-02-21 LAB — C. TRACHOMATIS/N. GONORRHOEAE RNA
C. TRACHOMATIS RNA, TMA: NOT DETECTED
N. gonorrhoeae RNA, TMA: NOT DETECTED

## 2018-03-07 ENCOUNTER — Ambulatory Visit (INDEPENDENT_AMBULATORY_CARE_PROVIDER_SITE_OTHER): Payer: BLUE CROSS/BLUE SHIELD | Admitting: Family Medicine

## 2018-03-07 ENCOUNTER — Encounter: Payer: Self-pay | Admitting: Family Medicine

## 2018-03-07 VITALS — BP 98/68 | HR 63 | Temp 98.2°F | Resp 18 | Wt 124.2 lb

## 2018-03-07 DIAGNOSIS — Z23 Encounter for immunization: Secondary | ICD-10-CM | POA: Diagnosis not present

## 2018-03-07 DIAGNOSIS — Z79899 Other long term (current) drug therapy: Secondary | ICD-10-CM

## 2018-03-07 DIAGNOSIS — F909 Attention-deficit hyperactivity disorder, unspecified type: Secondary | ICD-10-CM

## 2018-03-07 DIAGNOSIS — R41840 Attention and concentration deficit: Secondary | ICD-10-CM | POA: Diagnosis not present

## 2018-03-07 DIAGNOSIS — F329 Major depressive disorder, single episode, unspecified: Secondary | ICD-10-CM

## 2018-03-07 DIAGNOSIS — F419 Anxiety disorder, unspecified: Secondary | ICD-10-CM | POA: Diagnosis not present

## 2018-03-07 DIAGNOSIS — E559 Vitamin D deficiency, unspecified: Secondary | ICD-10-CM | POA: Diagnosis not present

## 2018-03-07 MED ORDER — AMPHETAMINE-DEXTROAMPHETAMINE 20 MG PO TABS: 20.0000 mg | ORAL_TABLET | Freq: Two times a day (BID) | ORAL | 0 refills | Status: DC

## 2018-03-07 MED ORDER — TERBINAFINE HCL 1 % EX CREA: 1.0000 "application " | TOPICAL_CREAM | Freq: Two times a day (BID) | CUTANEOUS | 1 refills | Status: DC

## 2018-03-07 MED ORDER — ESCITALOPRAM OXALATE 10 MG PO TABS: 10.0000 mg | ORAL_TABLET | Freq: Every day | ORAL | 5 refills | Status: DC

## 2018-03-07 NOTE — Progress Notes (Signed)
Subjective:  I acted as a Education administrator for Dr. Charlett Blake. [name], [place]  Patient ID: [name], female    DOB: Nov 13, 1970, 47 y.o.   MRN: [id number]  No chief complaint on file.   HPI  Patient is in today for a medication follow up and she is struggling with significant stress due to her recent divorce and continuing to raise her 60 and 81 year old children. She acknowledges anhedonia, depression and significant anxiety but no suicidal ideation. She is managing but it has been a struggle due to the animosity with her ex spouse. She continues to use the Adderall to help her concentrate. No recent febrile illness or hospitalizations. Denies CP/palp/SOB/HA/congestion/fevers/GI or GU c/o. Taking meds as prescribed  Patient Care Team: Mosie Lukes, MD as PCP - General (Family Medicine)   Past Medical History:  Diagnosis Date  . Alopecia   . Anxiety and depression 07/31/2011  . Chest pain 09/22/10   Sharp pains in left breast & sometimes in the right breast as well. Random in occurence & nonexertional. Also has occasional SOB & DOE when going up stairs & occasionally when working out. Nuclear Stress Treadmill 09/29/10 -   . HSV-2 (herpes simplex virus 2) infection   . IBS (irritable bowel syndrome)   . Interstitial cystitis   . Multiple allergies 07/02/2011  . Otitis media 03/16/2012  . Perimenopausal 07/22/2013  . PONV (postoperative nausea and vomiting)   . Poor concentration 07/02/2011  . Shoulder pain, left 04/11/2012  . SOB (shortness of breath) 09/22/10   ECHO 09/29/10 - NL LVF, trivial PE, trivial TR/PR.  Marland Kitchen UTI (lower urinary tract infection) 08/28/2011    Past Surgical History:  Procedure Laterality Date  . BREAST BIOPSY Right 03/12/2016   Procedure: breast exploration;  Surgeon: Jackolyn Confer, MD;  Location: [facility];  Service: General;  Laterality: Right;  . BREAST ENHANCEMENT SURGERY  2007  . CESAREAN SECTION  2001 and 2004   x2  . LAPAROSCOPY     twice  .  RHINOPLASTY  2003  . TONSILLECTOMY  1982  . TUBAL LIGATION      Family History  Problem Relation Age of Onset  . Heart disease Mother        MVP  . Anxiety disorder Mother   . Mitral valve prolapse Mother   . Hypertension Brother   . Obesity Brother   . Diabetes Brother        DM, obese  . Allergies Daughter   . Allergies Son   . Kidney disease Maternal Grandmother        uremia  . Breast cancer Neg Hx     Social History   Socioeconomic History  . Marital status: Married    Spouse name: Not on file  . Number of children: Not on file  . Years of education: Not on file  . Highest education level: Not on file  Occupational History  . Not on file  Social Needs  . Financial resource strain: Not on file  . Food insecurity:    Worry: Not on file    Inability: Not on file  . Transportation needs:    Medical: Not on file    Non-medical: Not on file  Tobacco Use  . Smoking status: Never Smoker  . Smokeless tobacco: Never Used  Substance and Sexual Activity  . Alcohol use: Yes    Alcohol/week: 0.0 oz    Comment: RARELY  . Drug use: No  . Sexual  activity: Yes    Birth control/protection: Surgical    Comment: Tubal lig-1st intercourse 47 yo-More than 5 partners  Lifestyle  . Physical activity:    Days per week: Not on file    Minutes per session: Not on file  . Stress: Not on file  Relationships  . Social connections:    Talks on phone: Not on file    Gets together: Not on file    Attends religious service: Not on file    Active member of club or organization: Not on file    Attends meetings of clubs or organizations: Not on file    Relationship status: Not on file  . Intimate partner violence:    Fear of current or ex partner: Not on file    Emotionally abused: Not on file    Physically abused: Not on file    Forced sexual activity: Not on file  Other Topics Concern  . Not on file  Social History Narrative  . Not on file    Outpatient Medications Prior  to Visit  Medication Sig Dispense Refill  . ALPRAZolam (XANAX) 0.5 MG tablet Take 1 tablet (0.5 mg total) by mouth 2 (two) times daily as needed for anxiety. 40 tablet 2  . aspirin 81 MG tablet Take 81 mg by mouth daily.      . Biotin 2500 MCG CAPS Take 1 capsule by mouth daily.      . Calcium Citrate-Vitamin D 250-200 MG-UNIT TABS Take 1 tablet by mouth daily. (Patient not taking: Reported on 02/20/2018) 30 each 11  . Cholecalciferol (VITAMIN D) 2000 UNITS CAPS Take 1 capsule (2,000 Units total) by mouth once. 30 capsule 11  . clotrimazole (LOTRIMIN) 1 % cream Apply 1 application topically 2 (two) times daily. 30 g 0  . estradiol (ESTRACE) 0.5 MG tablet Take 1 tablet (0.5 mg total) by mouth daily. 90 tablet 4  . fexofenadine (ALLEGRA) 180 MG tablet Take 180 mg by mouth daily.      . finasteride (PROSCAR) 5 MG tablet Take 5 mg by mouth daily. Take 1/2 tab daily    . fish oil-omega-3 fatty acids 1000 MG capsule Take 2 g by mouth daily.     . iron polysaccharides (POLY-IRON 150) 150 MG capsule Take 1 capsule (150 mg total) by mouth 2 (two) times daily. (Patient not taking: Reported on 02/20/2018) 60 capsule 6  . Meth-Hyo-M Bl-Na Phos-Ph Sal (URIBEL) 118 MG CAPS Take 1 capsule (118 mg total) by mouth 4 (four) times daily. As needed for bladder pain (Patient not taking: Reported on 02/20/2018) 120 capsule 1  . mometasone (NASONEX) 50 MCG/ACT nasal spray Place 2 sprays into the nose daily. 17 g 6  . Multiple Vitamin (MULTIVITAMIN) capsule Take 1 capsule by mouth daily.      . progesterone (PROMETRIUM) 100 MG capsule Take 1 capsule (100 mg total) by mouth at bedtime. 90 capsule 4  . traMADol (ULTRAM) 50 MG tablet Take 1 tablet (50 mg total) by mouth every 6 (six) hours as needed. 60 tablet 1  . tretinoin (RETIN-A) 0.025 % cream     . valACYclovir (VALTREX) 500 MG tablet Take 1 tablet (500 mg total) by mouth daily. 90 tablet 4  . zinc gluconate 50 MG tablet Take 50 mg by mouth daily.    Marland Kitchen  amphetamine-dextroamphetamine (ADDERALL) 20 MG tablet Take 1 tablet (20 mg total) by mouth 2 (two) times daily. January 2018 60 tablet 0   No facility-administered medications prior to  visit.     No Known Allergies  Review of Systems  Constitutional: Negative for fever and malaise/fatigue.  HENT: Negative for congestion.   Eyes: Negative for blurred vision.  Respiratory: Negative for shortness of breath.   Cardiovascular: Negative for chest pain, palpitations and leg swelling.  Gastrointestinal: Negative for abdominal pain, blood in stool and nausea.  Genitourinary: Negative for dysuria and frequency.  Musculoskeletal: Negative for falls.  Skin: Negative for rash.  Neurological: Negative for dizziness, loss of consciousness and headaches.  Endo/Heme/Allergies: Negative for environmental allergies.  Psychiatric/Behavioral: Positive for depression. Negative for suicidal ideas. The patient is nervous/anxious.        Objective:    Physical Exam  Constitutional: She is oriented to person, place, and time. She appears well-developed and well-nourished. No distress.  HENT:  Head: Normocephalic and atraumatic.  Nose: Nose normal.  Eyes: Right eye exhibits no discharge. Left eye exhibits no discharge.  Neck: Normal range of motion. Neck supple.  Cardiovascular: Normal rate and regular rhythm.  No murmur heard. Pulmonary/Chest: Effort normal and breath sounds normal.  Abdominal: Soft. Bowel sounds are normal. There is no tenderness.  Musculoskeletal: She exhibits no edema.  Neurological: She is alert and oriented to person, place, and time.  Skin: Skin is warm and dry.  Psychiatric: She has a normal mood and affect.  Nursing note and vitals reviewed.   BP 98/68 (BP Location: Left Arm, Patient Position: Sitting, Cuff Size: Normal)   Pulse 63   Temp 98.2 F (36.8 C) (Oral)   Resp 18   Wt 124 lb 3.2 oz (56.3 kg)   LMP 07/03/2015   SpO2 98%   BMI 20.67 kg/m  Wt Readings from  Last 3 Encounters:  03/07/18 124 lb 3.2 oz (56.3 kg)  02/18/18 124 lb (56.2 kg)  12/22/17 124 lb (56.2 kg)   BP Readings from Last 3 Encounters:  03/07/18 98/68  02/20/18 104/66  02/18/18 118/74     Immunization History  Administered Date(s) Administered  . Hepatitis A 10/21/1998, 03/07/2010  . Hepatitis B 07/22/1998, 09/08/1998, 01/19/1999  . Influenza Split 07/31/2011  . Influenza,inj,Quad PF,6+ Mos 07/20/2013, 07/05/2014, 07/16/2015, 06/24/2016, 07/25/2017  . Influenza-Unspecified 03/31/2010  . Td 07/06/2005  . Tdap 07/06/2005, 09/07/2013  . Typhoid Inactivated 03/31/2010    Health Maintenance  Topic Date Due  . INFLUENZA VACCINE  06/01/2018  . PAP SMEAR  12/22/2020  . TETANUS/TDAP  09/08/2023  . HIV Screening  Completed    Lab Results  Component Value Date   WBC 5.3 12/23/2017   HGB 14.5 12/23/2017   HCT 43.2 12/23/2017   PLT 223 12/23/2017   GLUCOSE 97 12/23/2017   CHOL 204 (H) 12/23/2017   TRIG 81 12/23/2017   HDL 60 12/23/2017   LDLCALC 126 (H) 12/23/2017   ALT 16 12/23/2017   AST 14 12/23/2017   NA 139 12/23/2017   K 4.6 12/23/2017   CL 105 12/23/2017   CREATININE 0.79 12/23/2017   BUN 22 12/23/2017   CO2 25 12/23/2017   TSH 1.52 12/23/2017    Lab Results  Component Value Date   TSH 1.52 12/23/2017   Lab Results  Component Value Date   WBC 5.3 12/23/2017   HGB 14.5 12/23/2017   HCT 43.2 12/23/2017   MCV 88.5 12/23/2017   PLT 223 12/23/2017   Lab Results  Component Value Date   NA 139 12/23/2017   K 4.6 12/23/2017   CO2 25 12/23/2017   GLUCOSE 97 12/23/2017   BUN  22 12/23/2017   CREATININE 0.79 12/23/2017   BILITOT 0.9 12/23/2017   ALKPHOS 51 12/21/2016   AST 14 12/23/2017   ALT 16 12/23/2017   PROT 7.4 12/23/2017   ALBUMIN 4.5 12/21/2016   CALCIUM 9.8 12/23/2017   GFR 113.62 06/24/2016   Lab Results  Component Value Date   CHOL 204 (H) 12/23/2017   Lab Results  Component Value Date   HDL 60 12/23/2017   Lab Results    Component Value Date   LDLCALC 126 (H) 12/23/2017   Lab Results  Component Value Date   TRIG 81 12/23/2017   Lab Results  Component Value Date   CHOLHDL 3.4 12/23/2017   No results found for: HGBA1C       Assessment & Plan:   Problem List Items Addressed This Visit    Adult ADHD    Doing well on current dose of Adderall allowed refills today      Vitamin D deficiency    Daily supplements      Poor concentration   Anxiety and depression    Had tolerated Lexapro in the past and not several other SSRIs. She has just completed her divorce and is struggling with the next phase. She acknowledges she is struggling with some depression as a result so she is started on Lexapro 5 mg for 1 week and then increase to 10 mg daily. Spent 30 minutes of a 40 minute visit in counseling      Relevant Medications   escitalopram (LEXAPRO) 10 MG tablet    Other Visit Diagnoses    High risk medication use    -  Primary   Relevant Orders   Pain Mgmt, Profile 8 w/Conf, U (Completed)   Need for MMR vaccine       Relevant Orders   Measles ([place]) Antibody IgG (Completed)      I have changed Katherine Mckay "Blanca"'s amphetamine-dextroamphetamine. I am also having her start on amphetamine-dextroamphetamine, amphetamine-dextroamphetamine, escitalopram, and terbinafine. Additionally, I am having her maintain her fish oil-omega-3 fatty acids, multivitamin, aspirin, fexofenadine, Calcium Citrate-Vitamin D, Vitamin D, Biotin, tretinoin, zinc gluconate, finasteride, mometasone, iron polysaccharides, ALPRAZolam, traMADol, URIBEL, progesterone, estradiol, valACYclovir, and clotrimazole.  Meds ordered this encounter  Medications  . amphetamine-dextroamphetamine (ADDERALL) 20 MG tablet    Sig: Take 1 tablet (20 mg total) by mouth 2 (two) times daily. July 2019    Dispense:  60 tablet    Refill:  0  . amphetamine-dextroamphetamine (ADDERALL) 20 MG tablet    Sig: Take 1 tablet (20 mg total) by  mouth 2 (two) times daily. June 2019    Dispense:  60 tablet    Refill:  0  . amphetamine-dextroamphetamine (ADDERALL) 20 MG tablet    Sig: Take 1 tablet (20 mg total) by mouth 2 (two) times daily. May 2019    Dispense:  60 tablet    Refill:  0  . escitalopram (LEXAPRO) 10 MG tablet    Sig: Take 1 tablet (10 mg total) by mouth daily.    Dispense:  30 tablet    Refill:  5  . terbinafine (LAMISIL AT) 1 % cream    Sig: Apply 1 application topically 2 (two) times daily.    Dispense:  30 g    Refill:  1    CMA served as scribe during this visit. History, Physical and Plan performed by medical provider. Documentation and orders reviewed and attested to.  Penni Homans, MD

## 2018-03-07 NOTE — Patient Instructions (Addendum)
Start Lexapro at 1/2 tab daily x 7 days then increase to a full tab as tolerated  Food Choices for Gastroesophageal Reflux Disease, Adult When you have gastroesophageal reflux disease (GERD), the foods you eat and your eating habits are very important. Choosing the right foods can help ease your discomfort. What guidelines do I need to follow?  Choose fruits, vegetables, whole grains, and low-fat dairy products.  Choose low-fat meat, fish, and poultry.  Limit fats such as oils, salad dressings, butter, nuts, and avocado.  Keep a food diary. This helps you identify foods that cause symptoms.  Avoid foods that cause symptoms. These may be different for everyone.  Eat small meals often instead of 3 large meals a day.  Eat your meals slowly, in a place where you are relaxed.  Limit fried foods.  Cook foods using methods other than frying.  Avoid drinking alcohol.  Avoid drinking large amounts of liquids with your meals.  Avoid bending over or lying down until 2-3 hours after eating. What foods are not recommended? These are some foods and drinks that may make your symptoms worse: Vegetables Tomatoes. Tomato juice. Tomato and spaghetti sauce. Chili peppers. Onion and garlic. Horseradish. Fruits Oranges, grapefruit, and lemon (fruit and juice). Meats High-fat meats, fish, and poultry. This includes hot dogs, ribs, ham, sausage, salami, and bacon. Dairy Whole milk and chocolate milk. Sour cream. Cream. Butter. Ice cream. Cream cheese. Drinks Coffee and tea. Bubbly (carbonated) drinks or energy drinks. Condiments Hot sauce. Barbecue sauce. Sweets/Desserts Chocolate and cocoa. Donuts. Peppermint and spearmint. Fats and Oils High-fat foods. This includes [place] fries and potato chips. Other Vinegar. Strong spices. This includes black pepper, white pepper, red pepper, cayenne, curry powder, cloves, ginger, and chili powder. The items listed above may not be a complete list of  foods and drinks to avoid. Contact your dietitian for more information. This information is not intended to replace advice given to you by your health care provider. Make sure you discuss any questions you have with your health care provider. Document Released: 04/18/2012 Document Revised: 03/25/2016 Document Reviewed: 08/22/2013 Elsevier Interactive Patient Education  2017 [employer].

## 2018-03-08 LAB — PAIN MGMT, PROFILE 8 W/CONF, U
6 ACETYLMORPHINE: NEGATIVE ng/mL (ref <10)
ALCOHOL METABOLITES: NEGATIVE ng/mL (ref <500)
AMPHETAMINES: NEGATIVE ng/mL (ref <500)
Benzodiazepines: NEGATIVE ng/mL (ref <100)
Buprenorphine, Urine: NEGATIVE ng/mL (ref <5)
COCAINE METABOLITE: NEGATIVE ng/mL (ref <150)
Creatinine: 110.5 mg/dL
MDMA: NEGATIVE ng/mL (ref <500)
Marijuana Metabolite: NEGATIVE ng/mL (ref <20)
OPIATES: NEGATIVE ng/mL (ref <100)
OXIDANT: NEGATIVE ug/mL (ref <200)
OXYCODONE: NEGATIVE ng/mL (ref <100)
pH: 5.19 (ref 4.5–9.0)

## 2018-03-08 LAB — RUBEOLA ANTIBODY IGG

## 2018-03-08 NOTE — Assessment & Plan Note (Addendum)
Had tolerated Lexapro in the past and not several other SSRIs. She has just completed her divorce and is struggling with the next phase. She acknowledges she is struggling with some depression as a result so she is started on Lexapro 5 mg for 1 week and then increase to 10 mg daily. Spent 30 minutes of a 40 minute visit in counseling

## 2018-03-08 NOTE — Assessment & Plan Note (Signed)
Daily supplements 

## 2018-03-08 NOTE — Assessment & Plan Note (Deleted)
Doing well on current dose of Adderall allowed refills today

## 2018-03-08 NOTE — Assessment & Plan Note (Signed)
Doing well on current dose of Adderall allowed refills today

## 2018-03-10 ENCOUNTER — Telehealth: Payer: Self-pay | Admitting: Family Medicine

## 2018-03-10 MED ORDER — TERBINAFINE HCL 1 % EX CREA: 1.0000 "application " | TOPICAL_CREAM | Freq: Two times a day (BID) | CUTANEOUS | 1 refills | Status: DC

## 2018-03-10 NOTE — Telephone Encounter (Signed)
Copied from [place] [id number]. Topic: Quick Communication - Rx Refill/Question >> Mar 10, 2018  1:27 PM Oliver Pila B wrote: Medication was not @ the pharmacy for pick up  Medication: terbinafine (LAMISIL AT) 1 % cream [[id number]] Has the patient contacted their pharmacy? Yes.   (Agent: If no, request that the patient contact the pharmacy for the refill.) Preferred Pharmacy (with phone number or street name): [facility] Agent: Please be advised that RX refills may take up to 3 business days. We ask that you follow-up with your pharmacy.

## 2018-03-10 NOTE — Telephone Encounter (Signed)
Contacted [facility] who states that prescription sent on 03/07/18 for Lamisil was not received. Medication resent to pharmacy.

## 2018-03-14 ENCOUNTER — Telehealth: Payer: Self-pay | Admitting: *Deleted

## 2018-03-14 ENCOUNTER — Ambulatory Visit (INDEPENDENT_AMBULATORY_CARE_PROVIDER_SITE_OTHER): Payer: BLUE CROSS/BLUE SHIELD | Admitting: *Deleted

## 2018-03-14 DIAGNOSIS — Z23 Encounter for immunization: Secondary | ICD-10-CM | POA: Diagnosis not present

## 2018-03-14 NOTE — Telephone Encounter (Signed)
Pt states that she had recently started Lexapro and has just increased to the full 10mg  tablet once a day. Notes that she had a severe headache over the weekend and states she doesn't usually get headaches. She wants to know if PCP thinks it could have been from the lexapro or the combination of lexapro and adderall. Pt reports that she stopped Adderall over the weekend and has not had any further headaches. She did note some increased fatigue on Sunday and Monday but that feels improved today.  She is anxious about restarting the Adderall as she "doesn't want to experience that type of headache again" and is unsure what caused it.  Please advise?  Pt said we may send her mychart response or call her cell#.

## 2018-03-14 NOTE — Telephone Encounter (Signed)
Spoke with patient she stated she will play around with the dosages at home and see what works best for her. She was advised if her HA became worst to seek treatment. She agreed and voiced her understanding. I also let her know after changing the dosage of her Adderall and the HA did not go away she would have to come in to be seen.

## 2018-03-14 NOTE — Progress Notes (Signed)
Pt here for MMR booster per Penni Homans, MD due to low immunity result.  MMR given SQ, right arm and pt tolerated injection well.

## 2018-03-14 NOTE — Telephone Encounter (Signed)
I cannot tell her 100% what it was but Adderall is more likely the culprit than the Lexapro. She could try a 1/2 strength dose of the Adderall or we could switch to something else. If it comes back, is the worst HA ever she will need to be seen. If she wants to try switching she should come in to discuss options

## 2018-03-24 ENCOUNTER — Ambulatory Visit: Payer: BLUE CROSS/BLUE SHIELD | Admitting: Family Medicine

## 2018-05-19 ENCOUNTER — Telehealth: Payer: Self-pay

## 2018-05-19 NOTE — Telephone Encounter (Signed)
Copied from [place] [phone]. Topic: Inquiry >> May 09, 2018  2:33 PM Pricilla Handler wrote: Reason for CRM: Patient needs a return call regarding a medication issue. Please call patient today.       Thank You!!!    Called patient left message for patient to call the office back

## 2018-05-23 MED ORDER — BUPROPION HCL ER (XL) 150 MG PO TB24: ORAL_TABLET | ORAL | 0 refills | Status: DC

## 2018-05-23 NOTE — Addendum Note (Signed)
Addended by: Magdalene Molly A on: 05/23/2018 06:16 PM   Modules accepted: Orders

## 2018-05-23 NOTE — Telephone Encounter (Signed)
Having problems with the lexapro. Weight gain she states she has watching what she eats. Also she has low libido she wants to either try Wellbutrin or d/c Lexapro. Either way she says she needs a plan.   Doing well as far as mood, and her day to day life she feels much better.     Please advise

## 2018-05-23 NOTE — Telephone Encounter (Signed)
She can switch to Wellbutrin XL 150 mg tabs, 1 tab po daily x 7 days then increase to 300 mg (2 tabs) po daily

## 2018-05-24 NOTE — Telephone Encounter (Signed)
Patient notified

## 2018-06-08 ENCOUNTER — Encounter: Payer: Self-pay | Admitting: Family Medicine

## 2018-06-08 ENCOUNTER — Ambulatory Visit (INDEPENDENT_AMBULATORY_CARE_PROVIDER_SITE_OTHER): Payer: BLUE CROSS/BLUE SHIELD | Admitting: Family Medicine

## 2018-06-08 ENCOUNTER — Ambulatory Visit: Payer: BLUE CROSS/BLUE SHIELD | Admitting: Family Medicine

## 2018-06-08 DIAGNOSIS — F909 Attention-deficit hyperactivity disorder, unspecified type: Secondary | ICD-10-CM

## 2018-06-08 DIAGNOSIS — F419 Anxiety disorder, unspecified: Secondary | ICD-10-CM

## 2018-06-08 DIAGNOSIS — F329 Major depressive disorder, single episode, unspecified: Secondary | ICD-10-CM

## 2018-06-08 MED ORDER — VALACYCLOVIR HCL 500 MG PO TABS: 500.0000 mg | ORAL_TABLET | Freq: Every day | ORAL | 4 refills | Status: DC

## 2018-06-08 MED ORDER — BUPROPION HCL ER (XL) 150 MG PO TB24: ORAL_TABLET | ORAL | 3 refills | Status: DC

## 2018-06-08 NOTE — Patient Instructions (Addendum)
Pricilla Holm and Billey Gosling at [place] at [employer] [place]  For your daughter  New shingles shot called Shingrix is 2 shots over 2-6 months. After 50, check with insurance. Document and then we can administer  Generalized Anxiety Disorder, Adult Generalized anxiety disorder (GAD) is a mental health disorder. People with this condition constantly worry about everyday events. Unlike normal anxiety, worry related to GAD is not triggered by a specific event. These worries also do not fade or get better with time. GAD interferes with life functions, including relationships, work, and school. GAD can vary from mild to severe. People with severe GAD can have intense waves of anxiety with physical symptoms (panic attacks). What are the causes? The exact cause of GAD is not known. What increases the risk? This condition is more likely to develop in:  Women.  People who have a family history of anxiety disorders.  People who are very shy.  People who experience very stressful life events, such as the death of a loved one.  People who have a very stressful family environment.  What are the signs or symptoms? People with GAD often worry excessively about many things in their lives, such as their health and family. They may also be overly concerned about:  Doing well at work.  Being on time.  Natural disasters.  Friendships.  Physical symptoms of GAD include:  Fatigue.  Muscle tension or having muscle twitches.  Trembling or feeling shaky.  Being easily startled.  Feeling like your heart is pounding or racing.  Feeling out of breath or like you cannot take a deep breath.  Having trouble falling asleep or staying asleep.  Sweating.  Nausea, diarrhea, or irritable bowel syndrome (IBS).  Headaches.  Trouble concentrating or remembering facts.  Restlessness.  Irritability.  How is this diagnosed? Your health care provider can diagnose GAD based  on your symptoms and medical history. You will also have a physical exam. The health care provider will ask specific questions about your symptoms, including how severe they are, when they started, and if they come and go. Your health care provider may ask you about your use of alcohol or drugs, including prescription medicines. Your health care provider may refer you to a mental health specialist for further evaluation. Your health care provider will do a thorough examination and may perform additional tests to rule out other possible causes of your symptoms. To be diagnosed with GAD, a person must have anxiety that:  Is out of his or her control.  Affects several different aspects of his or her life, such as work and relationships.  Causes distress that makes him or her unable to take part in normal activities.  Includes at least three physical symptoms of GAD, such as restlessness, fatigue, trouble concentrating, irritability, muscle tension, or sleep problems.  Before your health care provider can confirm a diagnosis of GAD, these symptoms must be present more days than they are not, and they must last for six months or longer. How is this treated? The following therapies are usually used to treat GAD:  Medicine. Antidepressant medicine is usually prescribed for long-term daily control. Antianxiety medicines may be added in severe cases, especially when panic attacks occur.  Talk therapy (psychotherapy). Certain types of talk therapy can be helpful in treating GAD by providing support, education, and guidance. Options include: ? Cognitive behavioral therapy (CBT). People learn coping skills and techniques to ease their anxiety. They learn to identify unrealistic or negative  thoughts and behaviors and to replace them with positive ones. ? Acceptance and commitment therapy (ACT). This treatment teaches people how to be mindful as a way to cope with unwanted thoughts and  feelings. ? Biofeedback. This process trains you to manage your body's response (physiological response) through breathing techniques and relaxation methods. You will work with a therapist while machines are used to monitor your physical symptoms.  Stress management techniques. These include yoga, meditation, and exercise.  A mental health specialist can help determine which treatment is best for you. Some people see improvement with one type of therapy. However, other people require a combination of therapies. Follow these instructions at home:  Take over-the-counter and prescription medicines only as told by your health care provider.  Try to maintain a normal routine.  Try to anticipate stressful situations and allow extra time to manage them.  Practice any stress management or self-calming techniques as taught by your health care provider.  Do not punish yourself for setbacks or for not making progress.  Try to recognize your accomplishments, even if they are small.  Keep all follow-up visits as told by your health care provider. This is important. Contact a health care provider if:  Your symptoms do not get better.  Your symptoms get worse.  You have signs of depression, such as: ? A persistently sad, cranky, or irritable mood. ? Loss of enjoyment in activities that used to bring you joy. ? Change in weight or eating. ? Changes in sleeping habits. ? Avoiding friends or family members. ? Loss of energy for normal tasks. ? Feelings of guilt or worthlessness. Get help right away if:  You have serious thoughts about hurting yourself or others. If you ever feel like you may hurt yourself or others, or have thoughts about taking your own life, get help right away. You can go to your nearest emergency department or call:  Your local emergency services (911 in the [place]  A suicide crisis helpline, such as the [facility] at 706-697-0489. This is open  24 hours a day.  Summary  Generalized anxiety disorder (GAD) is a mental health disorder that involves worry that is not triggered by a specific event.  People with GAD often worry excessively about many things in their lives, such as their health and family.  GAD may cause physical symptoms such as restlessness, trouble concentrating, sleep problems, frequent sweating, nausea, diarrhea, headaches, and trembling or muscle twitching.  A mental health specialist can help determine which treatment is best for you. Some people see improvement with one type of therapy. However, other people require a combination of therapies. This information is not intended to replace advice given to you by your health care provider. Make sure you discuss any questions you have with your health care provider. Document Released: 02/12/2013 Document Revised: 09/07/2016 Document Reviewed: 09/07/2016 Elsevier Interactive Patient Education  [employer].

## 2018-06-11 NOTE — Progress Notes (Signed)
Subjective:    Patient ID: [name], female    DOB: December 14, 1970, 48 y.o.   MRN: [id number]  No chief complaint on file.   HPI Patient is in today for follow up. She feels well today. No recent febrile illness or acute hospitalizations. She is pleased with her response to Wellbutrin and has not had any concerning side effects. She continues with counseling. Denies CP/palp/SOB/HA/congestion/fevers/GI or GU c/o. Taking meds as prescribed  Past Medical History:  Diagnosis Date  . Alopecia   . Anxiety and depression 07/31/2011  . Chest pain 09/22/10   Sharp pains in left breast & sometimes in the right breast as well. Random in occurence & nonexertional. Also has occasional SOB & DOE when going up stairs & occasionally when working out. Nuclear Stress Treadmill 09/29/10 -   . HSV-2 (herpes simplex virus 2) infection   . IBS (irritable bowel syndrome)   . Interstitial cystitis   . Multiple allergies 07/02/2011  . Otitis media 03/16/2012  . Perimenopausal 07/22/2013  . PONV (postoperative nausea and vomiting)   . Poor concentration 07/02/2011  . Shoulder pain, left 04/11/2012  . SOB (shortness of breath) 09/22/10   ECHO 09/29/10 - NL LVF, trivial PE, trivial TR/PR.  Marland Kitchen UTI (lower urinary tract infection) 08/28/2011    Past Surgical History:  Procedure Laterality Date  . BREAST BIOPSY Right 03/12/2016   Procedure: breast exploration;  Surgeon: Jackolyn Confer, MD;  Location: [facility];  Service: General;  Laterality: Right;  . BREAST ENHANCEMENT SURGERY  2007  . CESAREAN SECTION  2001 and 2004   x2  . LAPAROSCOPY     twice  . RHINOPLASTY  2003  . TONSILLECTOMY  1982  . TUBAL LIGATION      Family History  Problem Relation Age of Onset  . Heart disease Mother        MVP  . Anxiety disorder Mother   . Mitral valve prolapse Mother   . Hypertension Brother   . Obesity Brother   . Diabetes Brother        DM, obese  . Allergies Daughter   . Allergies Son   .  Kidney disease Maternal Grandmother        uremia  . Breast cancer Neg Hx     Social History   Socioeconomic History  . Marital status: Married    Spouse name: Not on file  . Number of children: Not on file  . Years of education: Not on file  . Highest education level: Not on file  Occupational History  . Not on file  Social Needs  . Financial resource strain: Not on file  . Food insecurity:    Worry: Not on file    Inability: Not on file  . Transportation needs:    Medical: Not on file    Non-medical: Not on file  Tobacco Use  . Smoking status: Never Smoker  . Smokeless tobacco: Never Used  Substance and Sexual Activity  . Alcohol use: Yes    Alcohol/week: 0.0 standard drinks    Comment: RARELY  . Drug use: No  . Sexual activity: Yes    Birth control/protection: Surgical    Comment: Tubal lig-1st intercourse 47 yo-More than 5 partners  Lifestyle  . Physical activity:    Days per week: Not on file    Minutes per session: Not on file  . Stress: Not on file  Relationships  . Social connections:    Talks on phone:  Not on file    Gets together: Not on file    Attends religious service: Not on file    Active member of club or organization: Not on file    Attends meetings of clubs or organizations: Not on file    Relationship status: Not on file  . Intimate partner violence:    Fear of current or ex partner: Not on file    Emotionally abused: Not on file    Physically abused: Not on file    Forced sexual activity: Not on file  Other Topics Concern  . Not on file  Social History Narrative  . Not on file    Outpatient Medications Prior to Visit  Medication Sig Dispense Refill  . ALPRAZolam (XANAX) 0.5 MG tablet Take 1 tablet (0.5 mg total) by mouth 2 (two) times daily as needed for anxiety. 40 tablet 2  . amphetamine-dextroamphetamine (ADDERALL) 20 MG tablet Take 1 tablet (20 mg total) by mouth 2 (two) times daily. July 2019 60 tablet 0  .  amphetamine-dextroamphetamine (ADDERALL) 20 MG tablet Take 1 tablet (20 mg total) by mouth 2 (two) times daily. June 2019 60 tablet 0  . amphetamine-dextroamphetamine (ADDERALL) 20 MG tablet Take 1 tablet (20 mg total) by mouth 2 (two) times daily. May 2019 60 tablet 0  . Biotin 2500 MCG CAPS Take 1 capsule by mouth daily.      Marland Kitchen estradiol (ESTRACE) 0.5 MG tablet Take 1 tablet (0.5 mg total) by mouth daily. 90 tablet 4  . fexofenadine (ALLEGRA) 180 MG tablet Take 180 mg by mouth daily.      . finasteride (PROSCAR) 5 MG tablet Take 5 mg by mouth daily. Take 1/2 tab daily    . fish oil-omega-3 fatty acids 1000 MG capsule Take 2 g by mouth daily.     . mometasone (NASONEX) 50 MCG/ACT nasal spray Place 2 sprays into the nose daily. 17 g 6  . Multiple Vitamin (MULTIVITAMIN) capsule Take 1 capsule by mouth daily.      . progesterone (PROMETRIUM) 100 MG capsule Take 1 capsule (100 mg total) by mouth at bedtime. 90 capsule 4  . terbinafine (LAMISIL AT) 1 % cream Apply 1 application topically 2 (two) times daily. 30 g 1  . tretinoin (RETIN-A) 0.025 % cream     . buPROPion (WELLBUTRIN XL) 150 MG 24 hr tablet Take 1 tablet po for 7 days then 2 tablets po daily 53 tablet 0  . valACYclovir (VALTREX) 500 MG tablet Take 1 tablet (500 mg total) by mouth daily. 90 tablet 4  . aspirin 81 MG tablet Take 81 mg by mouth daily.      . Calcium Citrate-Vitamin D 250-200 MG-UNIT TABS Take 1 tablet by mouth daily. (Patient not taking: Reported on 02/20/2018) 30 each 11  . Cholecalciferol (VITAMIN D) 2000 UNITS CAPS Take 1 capsule (2,000 Units total) by mouth once. 30 capsule 11  . clotrimazole (LOTRIMIN) 1 % cream Apply 1 application topically 2 (two) times daily. 30 g 0  . iron polysaccharides (POLY-IRON 150) 150 MG capsule Take 1 capsule (150 mg total) by mouth 2 (two) times daily. (Patient not taking: Reported on 02/20/2018) 60 capsule 6  . Meth-Hyo-M Bl-Na Phos-Ph Sal (URIBEL) 118 MG CAPS Take 1 capsule (118 mg total) by  mouth 4 (four) times daily. As needed for bladder pain (Patient not taking: Reported on 02/20/2018) 120 capsule 1  . traMADol (ULTRAM) 50 MG tablet Take 1 tablet (50 mg total) by mouth every  6 (six) hours as needed. 60 tablet 1  . zinc gluconate 50 MG tablet Take 50 mg by mouth daily.     No facility-administered medications prior to visit.     No Known Allergies  Review of Systems  Constitutional: Negative for fever and malaise/fatigue.  HENT: Negative for congestion.   Eyes: Negative for blurred vision.  Respiratory: Negative for shortness of breath.   Cardiovascular: Negative for chest pain, palpitations and leg swelling.  Gastrointestinal: Negative for abdominal pain, blood in stool and nausea.  Genitourinary: Negative for dysuria and frequency.  Musculoskeletal: Negative for falls.  Skin: Negative for rash.  Neurological: Negative for dizziness, loss of consciousness and headaches.  Endo/Heme/Allergies: Negative for environmental allergies.  Psychiatric/Behavioral: Negative for depression. The patient is nervous/anxious.        Objective:    Physical Exam  Constitutional: She is oriented to person, place, and time. She appears well-developed and well-nourished. No distress.  HENT:  Head: Normocephalic and atraumatic.  Nose: Nose normal.  Eyes: Right eye exhibits no discharge. Left eye exhibits no discharge.  Neck: Normal range of motion. Neck supple.  Cardiovascular: Normal rate and regular rhythm.  No murmur heard. Pulmonary/Chest: Effort normal and breath sounds normal.  Abdominal: Soft. Bowel sounds are normal. There is no tenderness.  Musculoskeletal: She exhibits no edema.  Neurological: She is alert and oriented to person, place, and time.  Skin: Skin is warm and dry.  Psychiatric: She has a normal mood and affect.  Nursing note and vitals reviewed.   BP 102/60 (BP Location: Left Arm, Patient Position: Sitting, Cuff Size: Normal)   Pulse 77   Temp 98.5 F  (36.9 C) (Oral)   Resp 18   Ht 5\' 6"  (1.676 m)   Wt 126 lb 6.4 oz (57.3 kg)   LMP 07/03/2015   SpO2 98%   BMI 20.40 kg/m  Wt Readings from Last 3 Encounters:  06/08/18 126 lb 6.4 oz (57.3 kg)  03/07/18 124 lb 3.2 oz (56.3 kg)  02/18/18 124 lb (56.2 kg)     Lab Results  Component Value Date   WBC 5.3 12/23/2017   HGB 14.5 12/23/2017   HCT 43.2 12/23/2017   PLT 223 12/23/2017   GLUCOSE 97 12/23/2017   CHOL 204 (H) 12/23/2017   TRIG 81 12/23/2017   HDL 60 12/23/2017   LDLCALC 126 (H) 12/23/2017   ALT 16 12/23/2017   AST 14 12/23/2017   NA 139 12/23/2017   K 4.6 12/23/2017   CL 105 12/23/2017   CREATININE 0.79 12/23/2017   BUN 22 12/23/2017   CO2 25 12/23/2017   TSH 1.52 12/23/2017    Lab Results  Component Value Date   TSH 1.52 12/23/2017   Lab Results  Component Value Date   WBC 5.3 12/23/2017   HGB 14.5 12/23/2017   HCT 43.2 12/23/2017   MCV 88.5 12/23/2017   PLT 223 12/23/2017   Lab Results  Component Value Date   NA 139 12/23/2017   K 4.6 12/23/2017   CO2 25 12/23/2017   GLUCOSE 97 12/23/2017   BUN 22 12/23/2017   CREATININE 0.79 12/23/2017   BILITOT 0.9 12/23/2017   ALKPHOS 51 12/21/2016   AST 14 12/23/2017   ALT 16 12/23/2017   PROT 7.4 12/23/2017   ALBUMIN 4.5 12/21/2016   CALCIUM 9.8 12/23/2017   GFR 113.62 06/24/2016   Lab Results  Component Value Date   CHOL 204 (H) 12/23/2017   Lab Results  Component Value Date  HDL 60 12/23/2017   Lab Results  Component Value Date   LDLCALC 126 (H) 12/23/2017   Lab Results  Component Value Date   TRIG 81 12/23/2017   Lab Results  Component Value Date   CHOLHDL 3.4 12/23/2017   No results found for: HGBA1C     Assessment & Plan:   Problem List Items Addressed This Visit    Adult ADHD    Doing well on current meds. Allowed refills today. uds and contract are up to date      Anxiety and depression    Good response to Wellbutrin, will continue with this and counseling.        Relevant Medications   buPROPion (WELLBUTRIN XL) 150 MG 24 hr tablet      I have discontinued Alisi Meloy "Blanca"'s aspirin, Calcium Citrate-Vitamin D, Vitamin D, zinc gluconate, iron polysaccharides, traMADol, URIBEL, and clotrimazole. I am also having her maintain her fish oil-omega-3 fatty acids, multivitamin, fexofenadine, Biotin, tretinoin, finasteride, mometasone, ALPRAZolam, progesterone, estradiol, amphetamine-dextroamphetamine, amphetamine-dextroamphetamine, amphetamine-dextroamphetamine, terbinafine, valACYclovir, and buPROPion.  Meds ordered this encounter  Medications  . DISCONTD: valACYclovir (VALTREX) 500 MG tablet    Sig: Take 1 tablet (500 mg total) by mouth daily.    Dispense:  90 tablet    Refill:  4  . valACYclovir (VALTREX) 500 MG tablet    Sig: Take 1 tablet (500 mg total) by mouth daily.    Dispense:  90 tablet    Refill:  4  . buPROPion (WELLBUTRIN XL) 150 MG 24 hr tablet    Sig: Take 1 tablet po for 7 days then 2 tablets po daily    Dispense:  60 tablet    Refill:  3     Penni Homans, MD

## 2018-06-11 NOTE — Assessment & Plan Note (Signed)
Doing well on current meds. Allowed refills today. uds and contract are up to date

## 2018-06-11 NOTE — Assessment & Plan Note (Signed)
Good response to Wellbutrin, will continue with this and counseling.

## 2018-06-14 DIAGNOSIS — H01001 Unspecified blepharitis right upper eyelid: Secondary | ICD-10-CM | POA: Diagnosis not present

## 2018-06-14 DIAGNOSIS — H01004 Unspecified blepharitis left upper eyelid: Secondary | ICD-10-CM | POA: Diagnosis not present

## 2018-06-14 DIAGNOSIS — H5203 Hypermetropia, bilateral: Secondary | ICD-10-CM | POA: Diagnosis not present

## 2018-06-14 DIAGNOSIS — H04123 Dry eye syndrome of bilateral lacrimal glands: Secondary | ICD-10-CM | POA: Diagnosis not present

## 2018-06-15 ENCOUNTER — Telehealth: Payer: Self-pay | Admitting: *Deleted

## 2018-06-15 NOTE — Telephone Encounter (Signed)
Received Medical records from [place]; forwarded to provider/SLS 08/15

## 2018-06-19 ENCOUNTER — Other Ambulatory Visit: Payer: Self-pay | Admitting: Family Medicine

## 2018-06-20 ENCOUNTER — Encounter: Payer: Self-pay | Admitting: Family Medicine

## 2018-06-20 LAB — UPPER ENDOSCOPY

## 2018-06-20 NOTE — Progress Notes (Signed)
Endoscopy report in chart 06/25/2010

## 2018-07-06 ENCOUNTER — Telehealth: Payer: Self-pay

## 2018-07-06 DIAGNOSIS — R350 Frequency of micturition: Secondary | ICD-10-CM

## 2018-07-06 DIAGNOSIS — R300 Dysuria: Secondary | ICD-10-CM

## 2018-07-06 NOTE — Telephone Encounter (Signed)
Patient called about having UTI symptoms

## 2018-07-07 ENCOUNTER — Other Ambulatory Visit: Payer: BLUE CROSS/BLUE SHIELD

## 2018-07-07 DIAGNOSIS — R350 Frequency of micturition: Secondary | ICD-10-CM | POA: Diagnosis not present

## 2018-07-07 DIAGNOSIS — R300 Dysuria: Secondary | ICD-10-CM | POA: Diagnosis not present

## 2018-07-07 NOTE — Addendum Note (Signed)
Addended by: Caffie Pinto on: 07/07/2018 02:12 PM   Modules accepted: Orders

## 2018-07-08 LAB — URINALYSIS
BILIRUBIN URINE: NEGATIVE
GLUCOSE, UA: NEGATIVE
HGB URINE DIPSTICK: NEGATIVE
Ketones, ur: NEGATIVE
LEUKOCYTES UA: NEGATIVE
Nitrite: NEGATIVE
PROTEIN: NEGATIVE
Specific Gravity, Urine: 1.024 (ref 1.001–1.03)
pH: 6 (ref 5.0–8.0)

## 2018-07-08 LAB — URINE CULTURE
MICRO NUMBER:: 91068232
Result:: NO GROWTH
SPECIMEN QUALITY: ADEQUATE

## 2018-07-20 ENCOUNTER — Other Ambulatory Visit: Payer: Self-pay | Admitting: Family Medicine

## 2018-08-15 ENCOUNTER — Other Ambulatory Visit: Payer: Self-pay | Admitting: Family Medicine

## 2018-09-08 ENCOUNTER — Ambulatory Visit (INDEPENDENT_AMBULATORY_CARE_PROVIDER_SITE_OTHER): Payer: BLUE CROSS/BLUE SHIELD | Admitting: Family Medicine

## 2018-09-08 ENCOUNTER — Other Ambulatory Visit: Payer: Self-pay | Admitting: Gynecology

## 2018-09-08 VITALS — BP 90/50 | HR 71 | Temp 98.4°F | Resp 16 | Ht 66.0 in | Wt 123.2 lb

## 2018-09-08 DIAGNOSIS — Z889 Allergy status to unspecified drugs, medicaments and biological substances status: Secondary | ICD-10-CM | POA: Diagnosis not present

## 2018-09-08 DIAGNOSIS — E559 Vitamin D deficiency, unspecified: Secondary | ICD-10-CM | POA: Diagnosis not present

## 2018-09-08 DIAGNOSIS — Z23 Encounter for immunization: Secondary | ICD-10-CM | POA: Diagnosis not present

## 2018-09-08 DIAGNOSIS — Z79899 Other long term (current) drug therapy: Secondary | ICD-10-CM | POA: Diagnosis not present

## 2018-09-08 DIAGNOSIS — F419 Anxiety disorder, unspecified: Secondary | ICD-10-CM

## 2018-09-08 DIAGNOSIS — F329 Major depressive disorder, single episode, unspecified: Secondary | ICD-10-CM

## 2018-09-08 DIAGNOSIS — F909 Attention-deficit hyperactivity disorder, unspecified type: Secondary | ICD-10-CM

## 2018-09-08 DIAGNOSIS — R002 Palpitations: Secondary | ICD-10-CM

## 2018-09-08 DIAGNOSIS — Z1231 Encounter for screening mammogram for malignant neoplasm of breast: Secondary | ICD-10-CM

## 2018-09-08 LAB — COMPREHENSIVE METABOLIC PANEL
ALBUMIN: 4.7 g/dL (ref 3.5–5.2)
ALT: 9 U/L (ref 0–35)
AST: 11 U/L (ref 0–37)
Alkaline Phosphatase: 40 U/L (ref 39–117)
BUN: 16 mg/dL (ref 6–23)
CALCIUM: 9.4 mg/dL (ref 8.4–10.5)
CHLORIDE: 104 meq/L (ref 96–112)
CO2: 30 meq/L (ref 19–32)
Creatinine, Ser: 0.7 mg/dL (ref 0.40–1.20)
GFR: 94.23 mL/min (ref >60.00)
Glucose, Bld: 96 mg/dL (ref 70–99)
POTASSIUM: 3.9 meq/L (ref 3.5–5.1)
Sodium: 140 mEq/L (ref 135–145)
Total Bilirubin: 0.7 mg/dL (ref 0.2–1.2)
Total Protein: 6.8 g/dL (ref 6.0–8.3)

## 2018-09-08 LAB — VITAMIN D 25 HYDROXY (VIT D DEFICIENCY, FRACTURES): VITD: 24.35 ng/mL — ABNORMAL LOW (ref 30.00–100.00)

## 2018-09-08 MED ORDER — MOMETASONE FUROATE 50 MCG/ACT NA SUSP: 2.0000 | Freq: Every day | NASAL | 6 refills | Status: DC

## 2018-09-08 MED ORDER — BUPROPION HCL ER (XL) 150 MG PO TB24: 150.0000 mg | ORAL_TABLET | Freq: Two times a day (BID) | ORAL | 1 refills | Status: DC

## 2018-09-08 MED ORDER — AMPHETAMINE-DEXTROAMPHETAMINE 20 MG PO TABS: 20.0000 mg | ORAL_TABLET | Freq: Two times a day (BID) | ORAL | 0 refills | Status: DC

## 2018-09-08 NOTE — Patient Instructions (Addendum)
Please stop in the lab today to complete your urine drug screen. Allergies, Adult An allergy is when your body's defense system (immune system) overreacts to an otherwise harmless substance (allergen) that you breathe in or eat or something that touches your skin. When you come into contact with something that you are allergic to, your immune system produces certain proteins (antibodies). These proteins cause cells to release chemicals (histamines) that trigger the symptoms of an allergic reaction. Allergies often affect the nasal passages (allergic rhinitis), eyes (allergic conjunctivitis), skin (atopic dermatitis), and stomach. Allergies can be mild or severe. Allergies cannot spread from person to person (are not contagious). They can develop at any age and may be outgrown. What increases the risk? You may be at greater risk of allergies if other people in your family have allergies. What are the signs or symptoms? Symptoms depend on what type of allergy you have. They may include:  Runny, stuffy nose.  Sneezing.  Itchy mouth, ears, or throat.  Postnasal drip.  Sore throat.  Itchy, red, watery, or puffy eyes.  Skin rash or hives.  Stomach pain.  Vomiting.  Diarrhea.  Bloating.  Wheezing or coughing.  People with a severe allergy to food, medicine, or an insect bite may have a life-threatening allergic reaction (anaphylaxis). Symptoms of anaphylaxis include:  Hives.  Itching.  Flushed face.  Swollen lips, tongue, or mouth.  Tight or swollen throat.  Chest pain or tightness in the chest.  Trouble breathing or shortness of breath.  Rapid heartbeat.  Dizziness or fainting.  Vomiting.  Diarrhea.  Pain in the abdomen.  How is this diagnosed? This condition is diagnosed based on:  Your symptoms.  Your family and medical history.  A physical exam.  You may need to see a health care provider who specializes in treating allergies (allergist). You may also  have tests, including:  Skin tests to see which allergens are causing your symptoms, such as: ? Skin prick test. In this test, your skin is pricked with a tiny needle and exposed to small amounts of possible allergens to see if your skin reacts. ? Intradermal skin test. In this test, a small amount of allergen is injected under your skin to see if your skin reacts. ? Patch test. In this test, a small amount of allergen is placed on your skin and then your skin is covered with a bandage. Your health care provider will check your skin after a couple of days to see if a rash has developed.  Blood tests.  Challenges tests. In this test, you inhale a small amount of allergen by mouth to see if you have an allergic reaction.  You may also be asked to:  Keep a food diary. A food diary is a record of all the foods and drinks you have in a day and any symptoms you experience.  Practice an elimination diet. An elimination diet involves eliminating specific foods from your diet and then adding them back in one by one to find out if a certain food causes an allergic reaction.  How is this treated? Treatment for allergies depends on your symptoms. Treatment may include:  Cold compresses to soothe itching and swelling.  Eye drops.  Nasal sprays.  Using a saline spray or container (neti pot) to flush out the nose (nasal irrigation). These methods can help clear away mucus and keep the nasal passages moist.  Using a humidifier.  Oral antihistamines or other medicines to block allergic reaction and inflammation.  Skin creams to treat rashes or itching.  Diet changes to eliminate food allergy triggers.  Repeated exposure to tiny amounts of allergens to build up a tolerance and prevent future allergic reactions (immunotherapy). These include: ? Allergy shots. ? Oral treatment. This involves taking small doses of an allergen under the tongue (sublingual immunotherapy).  Emergency epinephrine  injection (auto-injector) in case of an allergic emergency. This is a self-injectable, pre-measured medicine that must be given within the first few minutes of a serious allergic reaction.  Follow these instructions at home:  Avoid known allergens whenever possible.  If you suffer from airborne allergens, wash out your nose daily. You can do this with a saline spray or a neti pot to flush out your nose (nasal irrigation).  Take over-the-counter and prescription medicines only as told by your health care provider.  Keep all follow-up visits as told by your health care provider. This is important.  If you are at risk of a severe allergic reaction (anaphylaxis), keep your auto-injector with you at all times.  If you have ever had anaphylaxis, wear a medical alert bracelet or necklace that states you have a severe allergy. Contact a health care provider if:  Your symptoms do not improve with treatment. Get help right away if:  You have symptoms of anaphylaxis, such as: ? Swollen mouth, tongue, or throat. ? Pain or tightness in your chest. ? Trouble breathing or shortness of breath. ? Dizziness or fainting. ? Severe abdominal pain, vomiting, or diarrhea. This information is not intended to replace advice given to you by your health care provider. Make sure you discuss any questions you have with your health care provider. Document Released: 01/11/2003 Document Revised: 02/16/2017 Document Reviewed: 05/05/2016 Elsevier Interactive Patient Education  [employer].

## 2018-09-10 LAB — PAIN MGMT, PROFILE 8 W/CONF, U
6 ACETYLMORPHINE: NEGATIVE ng/mL (ref <10)
AMPHETAMINE: NEGATIVE ng/mL (ref <250)
Alcohol Metabolites: NEGATIVE ng/mL (ref <500)
Amphetamines: NEGATIVE ng/mL (ref <500)
BUPRENORPHINE, URINE: NEGATIVE ng/mL (ref <5)
Benzodiazepines: NEGATIVE ng/mL (ref <100)
Cocaine Metabolite: NEGATIVE ng/mL (ref <150)
Creatinine: 243.5 mg/dL
MARIJUANA METABOLITE: NEGATIVE ng/mL (ref <20)
MDMA: NEGATIVE ng/mL (ref <500)
Methamphetamine: NEGATIVE ng/mL (ref <250)
OPIATES: NEGATIVE ng/mL (ref <100)
Oxidant: NEGATIVE ug/mL (ref <200)
Oxycodone: NEGATIVE ng/mL (ref <100)
PH: 6.22 (ref 4.5–9.0)

## 2018-09-11 ENCOUNTER — Telehealth: Payer: Self-pay | Admitting: *Deleted

## 2018-09-11 DIAGNOSIS — E559 Vitamin D deficiency, unspecified: Secondary | ICD-10-CM

## 2018-09-11 MED ORDER — VITAMIN D (ERGOCALCIFEROL) 1.25 MG (50000 UNIT) PO CAPS: 50000.0000 [IU] | ORAL_CAPSULE | ORAL | 4 refills | Status: DC

## 2018-09-11 NOTE — Assessment & Plan Note (Signed)
Doing well on current dose of Wellbutrin no changes.

## 2018-09-11 NOTE — Progress Notes (Signed)
Subjective:    Patient ID: [name], female    DOB: [birth year], 47 y.o.   MRN: [id number]  Chief Complaint  Patient presents with  . anxiety and depression    Pt here for follow up  . ADHD    Pt here for follow up    HPI Patient is in today for follow up. She is doing well. No recent febrile illness or hospitalizations. She is doing well with her divorce and life changes. She feels the counseling and medications are helping. The Aderall is helping he focus at work. Denies CP/palp/SOB/HA/congestion/fevers/GI or GU c/o. Taking meds as prescribed  Past Medical History:  Diagnosis Date  . Alopecia   . Anxiety and depression 07/31/2011  . Chest pain 09/22/10   Sharp pains in left breast & sometimes in the right breast as well. Random in occurence & nonexertional. Also has occasional SOB & DOE when going up stairs & occasionally when working out. Nuclear Stress Treadmill 09/29/10 -   . HSV-2 (herpes simplex virus 2) infection   . IBS (irritable bowel syndrome)   . Interstitial cystitis   . Multiple allergies 07/02/2011  . Otitis media 03/16/2012  . Perimenopausal 07/22/2013  . PONV (postoperative nausea and vomiting)   . Poor concentration 07/02/2011  . Shoulder pain, left 04/11/2012  . SOB (shortness of breath) 09/22/10   ECHO 09/29/10 - NL LVF, trivial PE, trivial TR/PR.  Marland Kitchen UTI (lower urinary tract infection) 08/28/2011    Past Surgical History:  Procedure Laterality Date  . BREAST BIOPSY Right 03/12/2016   Procedure: breast exploration;  Surgeon: Jackolyn Confer, MD;  Location: [facility];  Service: General;  Laterality: Right;  . BREAST ENHANCEMENT SURGERY  2007  . CESAREAN SECTION  2001 and 2004   x2  . LAPAROSCOPY     twice  . RHINOPLASTY  2003  . TONSILLECTOMY  1982  . TUBAL LIGATION      Family History  Problem Relation Age of Onset  . Heart disease Mother        MVP  . Anxiety disorder Mother   . Mitral valve prolapse Mother   . Hypertension  Brother   . Obesity Brother   . Diabetes Brother        DM, obese  . Allergies Daughter   . Allergies Son   . Kidney disease Maternal Grandmother        uremia  . Breast cancer Neg Hx     Social History   Socioeconomic History  . Marital status: Married    Spouse name: Not on file  . Number of children: Not on file  . Years of education: Not on file  . Highest education level: Not on file  Occupational History  . Not on file  Social Needs  . Financial resource strain: Not on file  . Food insecurity:    Worry: Not on file    Inability: Not on file  . Transportation needs:    Medical: Not on file    Non-medical: Not on file  Tobacco Use  . Smoking status: Never Smoker  . Smokeless tobacco: Never Used  Substance and Sexual Activity  . Alcohol use: Yes    Alcohol/week: 0.0 standard drinks    Comment: RARELY  . Drug use: No  . Sexual activity: Yes    Birth control/protection: Surgical    Comment: Tubal lig-1st intercourse 47 yo-More than 5 partners  Lifestyle  . Physical activity:    Days per  week: Not on file    Minutes per session: Not on file  . Stress: Not on file  Relationships  . Social connections:    Talks on phone: Not on file    Gets together: Not on file    Attends religious service: Not on file    Active member of club or organization: Not on file    Attends meetings of clubs or organizations: Not on file    Relationship status: Not on file  . Intimate partner violence:    Fear of current or ex partner: Not on file    Emotionally abused: Not on file    Physically abused: Not on file    Forced sexual activity: Not on file  Other Topics Concern  . Not on file  Social History Narrative  . Not on file    Outpatient Medications Prior to Visit  Medication Sig Dispense Refill  . estradiol (ESTRACE) 0.5 MG tablet Take 1 tablet (0.5 mg total) by mouth daily. 90 tablet 4  . fexofenadine (ALLEGRA) 180 MG tablet Take 180 mg by mouth daily.      .  finasteride (PROSCAR) 5 MG tablet Take 5 mg by mouth daily. Take 1/2 tab daily    . hydrOXYzine (ATARAX/VISTARIL) 25 MG tablet Take 1 tablet by mouth at bedtime as needed for other. "for interstitial cystitis"    . progesterone (PROMETRIUM) 100 MG capsule Take 1 capsule (100 mg total) by mouth at bedtime. 90 capsule 4  . tretinoin (RETIN-A) 0.025 % cream     . valACYclovir (VALTREX) 500 MG tablet Take 1 tablet (500 mg total) by mouth daily. 90 tablet 4  . amphetamine-dextroamphetamine (ADDERALL) 20 MG tablet Take 1 tablet (20 mg total) by mouth 2 (two) times daily. July 2019 60 tablet 0  . amphetamine-dextroamphetamine (ADDERALL) 20 MG tablet Take 1 tablet (20 mg total) by mouth 2 (two) times daily. June 2019 60 tablet 0  . amphetamine-dextroamphetamine (ADDERALL) 20 MG tablet Take 1 tablet (20 mg total) by mouth 2 (two) times daily. May 2019 60 tablet 0  . buPROPion (WELLBUTRIN XL) 150 MG 24 hr tablet TAKE 1 TABLET BY MOUTH FOR 7 DAYS THEN 2 TABLETS BY MOUTH ONCE DAILY 159 tablet 1  . mometasone (NASONEX) 50 MCG/ACT nasal spray Place 2 sprays into the nose daily. 17 g 6  . ALPRAZolam (XANAX) 0.5 MG tablet Take 1 tablet (0.5 mg total) by mouth 2 (two) times daily as needed for anxiety. (Patient not taking: Reported on 09/08/2018) 40 tablet 2  . Biotin 2500 MCG CAPS Take 1 capsule by mouth daily.      . fish oil-omega-3 fatty acids 1000 MG capsule Take 2 g by mouth daily.     . Multiple Vitamin (MULTIVITAMIN) capsule Take 1 capsule by mouth daily.      Marland Kitchen terbinafine (LAMISIL AT) 1 % cream Apply 1 application topically 2 (two) times daily. (Patient not taking: Reported on 09/08/2018) 30 g 1   No facility-administered medications prior to visit.     No Known Allergies  Review of Systems  Constitutional: Negative for fever and malaise/fatigue.  HENT: Negative for congestion.   Eyes: Negative for blurred vision.  Respiratory: Negative for shortness of breath.   Cardiovascular: Negative for chest  pain, palpitations and leg swelling.  Gastrointestinal: Negative for abdominal pain, blood in stool and nausea.  Genitourinary: Negative for dysuria and frequency.  Musculoskeletal: Negative for falls.  Skin: Negative for rash.  Neurological: Negative for dizziness, loss  of consciousness and headaches.  Endo/Heme/Allergies: Negative for environmental allergies.  Psychiatric/Behavioral: Negative for depression. The patient is not nervous/anxious.        Objective:    Physical Exam  Constitutional: She is oriented to person, place, and time. No distress.  HENT:  Head: Normocephalic and atraumatic.  Right Ear: External ear normal.  Left Ear: External ear normal.  Nose: Nose normal.  Mouth/Throat: Oropharynx is clear and moist. No oropharyngeal exudate.  Eyes: Pupils are equal, round, and reactive to light. Conjunctivae are normal. Right eye exhibits no discharge. Left eye exhibits no discharge. No scleral icterus.  Neck: Normal range of motion. Neck supple. No thyromegaly present.  Cardiovascular: Normal rate, regular rhythm, normal heart sounds and intact distal pulses.  No murmur heard. Pulmonary/Chest: Effort normal and breath sounds normal. No respiratory distress. She has no wheezes. She has no rales.  Abdominal: Soft. Bowel sounds are normal. She exhibits no distension and no mass. There is no tenderness.  Musculoskeletal: Normal range of motion. She exhibits no edema or tenderness.  Lymphadenopathy:    She has no cervical adenopathy.  Neurological: She is alert and oriented to person, place, and time. She has normal reflexes. She displays normal reflexes. No cranial nerve deficit. Coordination normal.  Skin: Skin is warm and dry. No rash noted. She is not diaphoretic.    BP (!) 90/50 (BP Location: Left Arm, Cuff Size: Normal)   Pulse 71   Temp 98.4 F (36.9 C) (Oral)   Resp 16   Ht 5\' 6"  (1.676 m)   Wt 123 lb 3.2 oz (55.9 kg)   LMP 07/03/2015   SpO2 100%   BMI 19.89  kg/m  Wt Readings from Last 3 Encounters:  09/08/18 123 lb 3.2 oz (55.9 kg)  06/08/18 126 lb 6.4 oz (57.3 kg)  03/07/18 124 lb 3.2 oz (56.3 kg)     Lab Results  Component Value Date   WBC 5.3 12/23/2017   HGB 14.5 12/23/2017   HCT 43.2 12/23/2017   PLT 223 12/23/2017   GLUCOSE 96 09/08/2018   CHOL 204 (H) 12/23/2017   TRIG 81 12/23/2017   HDL 60 12/23/2017   LDLCALC 126 (H) 12/23/2017   ALT 9 09/08/2018   AST 11 09/08/2018   NA 140 09/08/2018   K 3.9 09/08/2018   CL 104 09/08/2018   CREATININE 0.70 09/08/2018   BUN 16 09/08/2018   CO2 30 09/08/2018   TSH 1.52 12/23/2017    Lab Results  Component Value Date   TSH 1.52 12/23/2017   Lab Results  Component Value Date   WBC 5.3 12/23/2017   HGB 14.5 12/23/2017   HCT 43.2 12/23/2017   MCV 88.5 12/23/2017   PLT 223 12/23/2017   Lab Results  Component Value Date   NA 140 09/08/2018   K 3.9 09/08/2018   CO2 30 09/08/2018   GLUCOSE 96 09/08/2018   BUN 16 09/08/2018   CREATININE 0.70 09/08/2018   BILITOT 0.7 09/08/2018   ALKPHOS 40 09/08/2018   AST 11 09/08/2018   ALT 9 09/08/2018   PROT 6.8 09/08/2018   ALBUMIN 4.7 09/08/2018   CALCIUM 9.4 09/08/2018   GFR 94.23 09/08/2018   Lab Results  Component Value Date   CHOL 204 (H) 12/23/2017   Lab Results  Component Value Date   HDL 60 12/23/2017   Lab Results  Component Value Date   LDLCALC 126 (H) 12/23/2017   Lab Results  Component Value Date   TRIG 81  12/23/2017   Lab Results  Component Value Date   CHOLHDL 3.4 12/23/2017   No results found for: HGBA1C     Assessment & Plan:   Problem List Items Addressed This Visit    Adult ADHD - Primary    No concerning side effects on Adderall, will continue      Relevant Orders   Pain Mgmt, Profile 8 w/Conf, U (Completed)   Vitamin D deficiency   Relevant Orders   VITAMIN D 25 Hydroxy (Vit-D Deficiency, Fractures) (Completed)   Comprehensive metabolic panel (Completed)   Multiple allergies    Relevant Medications   mometasone (NASONEX) 50 MCG/ACT nasal spray   Anxiety and depression    Doing well on current dose of Wellbutrin no changes.       Relevant Medications   hydrOXYzine (ATARAX/VISTARIL) 25 MG tablet   buPROPion (WELLBUTRIN XL) 150 MG 24 hr tablet   Palpitations    Other Visit Diagnoses    Encounter for long-term (current) use of high-risk medication       Relevant Orders   Pain Mgmt, Profile 8 w/Conf, U (Completed)   Need for immunization against influenza       Relevant Orders   Flu Vaccine QUAD 6+ mos PF IM (Fluarix Quad PF) (Completed)      I have discontinued Alashia Whichard "Blanca"'s fish oil-omega-3 fatty acids, multivitamin, Biotin, ALPRAZolam, and terbinafine. I have also changed her buPROPion, amphetamine-dextroamphetamine, amphetamine-dextroamphetamine, and amphetamine-dextroamphetamine. Additionally, I am having her maintain her fexofenadine, tretinoin, finasteride, progesterone, estradiol, valACYclovir, hydrOXYzine, and mometasone.  Meds ordered this encounter  Medications  . buPROPion (WELLBUTRIN XL) 150 MG 24 hr tablet    Sig: Take 1 tablet (150 mg total) by mouth 2 (two) times daily.    Dispense:  180 tablet    Refill:  1  . mometasone (NASONEX) 50 MCG/ACT nasal spray    Sig: Place 2 sprays into the nose daily.    Dispense:  17 g    Refill:  6  . amphetamine-dextroamphetamine (ADDERALL) 20 MG tablet    Sig: Take 1 tablet (20 mg total) by mouth 2 (two) times daily. February 2020    Dispense:  60 tablet    Refill:  0  . amphetamine-dextroamphetamine (ADDERALL) 20 MG tablet    Sig: Take 1 tablet (20 mg total) by mouth 2 (two) times daily. Jan 2020    Dispense:  60 tablet    Refill:  0  . amphetamine-dextroamphetamine (ADDERALL) 20 MG tablet    Sig: Take 1 tablet (20 mg total) by mouth 2 (two) times daily. Dec 2019    Dispense:  60 tablet    Refill:  0     Penni Homans, MD

## 2018-09-11 NOTE — Telephone Encounter (Signed)
Would recheck level in roughly 12 weeks. Please set it up. thanks

## 2018-09-11 NOTE — Telephone Encounter (Signed)
Notified pt and sent RX. Pt will pick up Vit D otc and take 2000 IU daily. Med list updated. Pt asks if Vit D level needs to be rechecked in the future and if so, when?

## 2018-09-11 NOTE — Telephone Encounter (Signed)
-----   Message from Mosie Lukes, MD sent at 09/08/2018  9:14 PM EST ----- Labs reveal deficiency. Start on Vitamin D 50000 IU caps, 1 cap po weekly x 12 weeks. Disp #4 with 4 rf. Also take daily Vitamin D over the counter. If already taking a daily supplement increase by 1000 IU daily and if not start Vitamin D 2000 IU daily.

## 2018-09-11 NOTE — Assessment & Plan Note (Signed)
No concerning side effects on Adderall, will continue

## 2018-09-12 NOTE — Telephone Encounter (Signed)
Notified pt and scheduled lab appt for 12/05/18 at 10am.

## 2018-09-18 ENCOUNTER — Other Ambulatory Visit: Payer: Self-pay | Admitting: Family Medicine

## 2018-09-18 DIAGNOSIS — Z889 Allergy status to unspecified drugs, medicaments and biological substances status: Secondary | ICD-10-CM

## 2018-10-18 ENCOUNTER — Ambulatory Visit
Admission: RE | Admit: 2018-10-18 | Discharge: 2018-10-18 | Disposition: A | Discharge status: Home / Self Care | Payer: BLUE CROSS/BLUE SHIELD | Source: Ambulatory Visit | Attending: Gynecology | Admitting: Gynecology

## 2018-10-18 DIAGNOSIS — Z1231 Encounter for screening mammogram for malignant neoplasm of breast: Secondary | ICD-10-CM

## 2018-10-20 ENCOUNTER — Telehealth: Payer: Self-pay | Admitting: *Deleted

## 2018-10-20 MED ORDER — NITROFURANTOIN MONOHYD MACRO 100 MG PO CAPS: ORAL_CAPSULE | ORAL | 0 refills | Status: DC

## 2018-10-20 NOTE — Telephone Encounter (Signed)
Okay for refill at local pharmacy

## 2018-10-20 NOTE — Telephone Encounter (Signed)
Patient informed, Rx sent.  

## 2018-10-20 NOTE — Telephone Encounter (Signed)
Patient called from [place] requesting Rx for Macrobid 100 mg #30 as prescribed at 12/22/17 visit. Using as needed with intercourse.  Please advise

## 2018-11-18 ENCOUNTER — Other Ambulatory Visit: Payer: Self-pay | Admitting: Family Medicine

## 2018-11-27 ENCOUNTER — Other Ambulatory Visit: Payer: Self-pay | Admitting: Family Medicine

## 2018-12-05 ENCOUNTER — Other Ambulatory Visit (INDEPENDENT_AMBULATORY_CARE_PROVIDER_SITE_OTHER): Payer: BLUE CROSS/BLUE SHIELD

## 2018-12-05 DIAGNOSIS — E559 Vitamin D deficiency, unspecified: Secondary | ICD-10-CM

## 2018-12-05 LAB — VITAMIN D 25 HYDROXY (VIT D DEFICIENCY, FRACTURES): VITD: 56.36 ng/mL (ref 30.00–100.00)

## 2018-12-19 ENCOUNTER — Ambulatory Visit: Payer: BLUE CROSS/BLUE SHIELD | Admitting: Family Medicine

## 2018-12-25 ENCOUNTER — Encounter: Payer: Self-pay | Admitting: Gynecology

## 2018-12-25 ENCOUNTER — Ambulatory Visit (INDEPENDENT_AMBULATORY_CARE_PROVIDER_SITE_OTHER): Payer: BLUE CROSS/BLUE SHIELD | Admitting: Gynecology

## 2018-12-25 VITALS — BP 110/70 | Ht 65.0 in | Wt 121.0 lb

## 2018-12-25 DIAGNOSIS — Z01419 Encounter for gynecological examination (general) (routine) without abnormal findings: Secondary | ICD-10-CM | POA: Diagnosis not present

## 2018-12-25 DIAGNOSIS — Z113 Encounter for screening for infections with a predominantly sexual mode of transmission: Secondary | ICD-10-CM | POA: Diagnosis not present

## 2018-12-25 DIAGNOSIS — Z13220 Encounter for screening for lipoid disorders: Secondary | ICD-10-CM | POA: Diagnosis not present

## 2018-12-25 MED ORDER — VALACYCLOVIR HCL 500 MG PO TABS: 500.0000 mg | ORAL_TABLET | Freq: Every day | ORAL | 4 refills | Status: DC

## 2018-12-25 MED ORDER — PROGESTERONE MICRONIZED 100 MG PO CAPS: 100.0000 mg | ORAL_CAPSULE | Freq: Every day | ORAL | 4 refills | Status: DC

## 2018-12-25 MED ORDER — ESTRADIOL 0.5 MG PO TABS: 1.0000 mg | ORAL_TABLET | Freq: Every day | ORAL | 4 refills | Status: DC

## 2018-12-25 MED ORDER — NITROFURANTOIN MONOHYD MACRO 100 MG PO CAPS: 100.0000 mg | ORAL_CAPSULE | Freq: Once | ORAL | 5 refills | Status: AC

## 2018-12-25 NOTE — Progress Notes (Signed)
    [name] 01-Jun-1971 [id number]        48 y.o.  [id number] for annual gynecologic exam.  Without gynecologic complaints  Past medical history,surgical history, problem list, medications, allergies, family history and social history were all reviewed and documented as reviewed in the EPIC chart.  ROS:  Performed with pertinent positives and negatives included in the history, assessment and plan.   Additional significant findings : None   Exam: [name] assistant Vitals:   12/25/18 1405  BP: 110/70  Weight: 121 lb (54.9 kg)  Height: 5\' 5"  (1.651 m)   Body mass index is 20.14 kg/m.  General appearance:  Normal affect, orientation and appearance. Skin: Grossly normal HEENT: Without gross lesions.  No cervical or supraclavicular adenopathy. Thyroid normal.  Lungs:  Clear without wheezing, rales or rhonchi Cardiac: RR, without RMG Abdominal:  Soft, nontender, without masses, guarding, rebound, organomegaly or hernia Breasts:  Examined lying and sitting without masses, retractions, discharge or axillary adenopathy.  Bilateral implants noted. Pelvic:  Ext, BUS, Vagina: Normal  Cervix: Normal.  GC/chlamydia  Uterus: Anteverted, normal size, shape and contour, midline and mobile nontender   Adnexa: Without masses or tenderness    Anus and perineum: Normal   Rectovaginal: Normal sphincter tone without palpated masses or tenderness.    Assessment/Plan:  48 y.o. [id number] female for annual gynecologic exam.   1. History of premature menopause on HRT.  Continues estradiol 0.5 mg and Prometrium 100 mg nightly.  Is still having some hot flushes and asked about increasing her dose.  We discussed the risks versus benefits multiple times and she is comfortable continuing.  Will try 1 mg estradiol and Prometrium at 100 mg nightly.  If she feels better on this regimen she will continue.  If not then will go back to the 0.5 mg.  Refill x1 year provided for both.  No bleeding and  patient knows to call if any bleeding. 2. Postcoital UTIs.  Uses Macrobid 100 mg with intercourse with good results.  #30 with 5 refills provided. 3. STD screening requested.  GC/chlamydia, HIV, RPR, hepatitis B and hepatitis C ordered. 4. Pap smear/HPV negative 2019.  No Pap smear done today.  No history of abnormal Pap smears previously. 5. Mammography reported 10/2018.  Continue with annual mammography when due.  Breast exam normal today. 6. History of HSV on Valtrex 500 mg daily.  Refill x1 year provided. 7. Colonoscopy reported a number of years ago.  Recommended patient call her gastroenterologist and arrange for screening colonoscopy as she has turned 48. 8. DEXA never.  Will plan further into the menopause. 9. Health maintenance.  Requests baseline labs and is fasting.  CBC, lipid profile ordered.  Recent CMP, TSH and vitamin D normal.  Follow-up in 1 year, sooner as needed.   Anastasio Auerbach MD, 2:44 PM 12/25/2018

## 2018-12-25 NOTE — Patient Instructions (Signed)
Follow-up in 1 year, sooner as needed. 

## 2018-12-26 ENCOUNTER — Encounter: Payer: Self-pay | Admitting: Gynecology

## 2018-12-26 ENCOUNTER — Ambulatory Visit: Payer: BLUE CROSS/BLUE SHIELD | Admitting: Family Medicine

## 2018-12-26 ENCOUNTER — Encounter: Payer: Self-pay | Admitting: Family Medicine

## 2018-12-26 DIAGNOSIS — E785 Hyperlipidemia, unspecified: Secondary | ICD-10-CM | POA: Diagnosis not present

## 2018-12-26 DIAGNOSIS — F909 Attention-deficit hyperactivity disorder, unspecified type: Secondary | ICD-10-CM | POA: Diagnosis not present

## 2018-12-26 DIAGNOSIS — E559 Vitamin D deficiency, unspecified: Secondary | ICD-10-CM | POA: Diagnosis not present

## 2018-12-26 LAB — CBC WITH DIFFERENTIAL/PLATELET
Absolute Monocytes: 403 cells/uL (ref 200–950)
Basophils Absolute: 37 cells/uL (ref 0–200)
Basophils Relative: 0.6 %
EOS ABS: 31 {cells}/uL (ref 15–500)
Eosinophils Relative: 0.5 %
HCT: 41.9 % (ref 35.0–45.0)
Hemoglobin: 14.3 g/dL (ref 11.7–15.5)
Lymphs Abs: 1767 cells/uL (ref 850–3900)
MCH: 30.4 pg (ref 27.0–33.0)
MCHC: 34.1 g/dL (ref 32.0–36.0)
MCV: 89.1 fL (ref 80.0–100.0)
MPV: 11.1 fL (ref 7.5–12.5)
Monocytes Relative: 6.5 %
Neutro Abs: 3962 cells/uL (ref 1500–7800)
Neutrophils Relative %: 63.9 %
Platelets: 253 10*3/uL (ref 140–400)
RBC: 4.7 10*6/uL (ref 3.80–5.10)
RDW: 12.3 % (ref 11.0–15.0)
Total Lymphocyte: 28.5 %
WBC: 6.2 10*3/uL (ref 3.8–10.8)

## 2018-12-26 LAB — HIV ANTIBODY (ROUTINE TESTING W REFLEX): HIV 1&2 Ab, 4th Generation: NONREACTIVE

## 2018-12-26 LAB — RPR: RPR Ser Ql: NONREACTIVE

## 2018-12-26 LAB — LIPID PANEL
Cholesterol: 233 mg/dL — ABNORMAL HIGH (ref <200)
HDL: 60 mg/dL (ref >=50)
LDL CHOLESTEROL (CALC): 155 mg/dL — AB
Non-HDL Cholesterol (Calc): 173 mg/dL (calc) — ABNORMAL HIGH (ref <130)
Total CHOL/HDL Ratio: 3.9 (calc) (ref <5.0)
Triglycerides: 74 mg/dL (ref <150)

## 2018-12-26 LAB — C. TRACHOMATIS/N. GONORRHOEAE RNA
C. TRACHOMATIS RNA, TMA: NOT DETECTED
N. GONORRHOEAE RNA, TMA: NOT DETECTED

## 2018-12-26 LAB — HEPATITIS C ANTIBODY
Hepatitis C Ab: NONREACTIVE
SIGNAL TO CUT-OFF: 0.01 (ref <1.00)

## 2018-12-26 LAB — HEPATITIS B SURFACE ANTIGEN: Hepatitis B Surface Ag: NONREACTIVE

## 2018-12-26 MED ORDER — AMPHETAMINE-DEXTROAMPHETAMINE 20 MG PO TABS: 20.0000 mg | ORAL_TABLET | Freq: Two times a day (BID) | ORAL | 0 refills | Status: DC

## 2018-12-26 NOTE — Patient Instructions (Addendum)
pescatarian diet with   MIND diet   The Blue Zones  Exercise  Red Yeast Rice  CoQ10 enzyme  Natural Steps   Fatty Acid supplements, fish or krill (Megared) or Flaxseed oil caps  Metamucil/Psyllium fiber or Benefiber fiber   Wellbutrin change to 150 twice daily then drop to once daily x 1 month then 1 every other day x 2-4 weeks then stop if any trouble could consider a short course of short acting Wellbutrin 75 mg tabs   Cholesterol Cholesterol is a white, waxy, fat-like substance that is needed by the human body in small amounts. The liver makes all the cholesterol we need. Cholesterol is carried from the liver by the blood through the blood vessels. Deposits of cholesterol (plaques) may build up on blood vessel (artery) walls. Plaques make the arteries narrower and stiffer. Cholesterol plaques increase the risk for heart attack and stroke. You cannot feel your cholesterol level even if it is very high. The only way to know that it is high is to have a blood test. Once you know your cholesterol levels, you should keep a record of the test results. Work with your health care provider to keep your levels in the desired range. What do the results mean?  Total cholesterol is a rough measure of all the cholesterol in your blood.  LDL (low-density lipoprotein) is the "bad" cholesterol. This is the type that causes plaque to build up on the artery walls. You want this level to be low.  HDL (high-density lipoprotein) is the "good" cholesterol because it cleans the arteries and carries the LDL away. You want this level to be high.  Triglycerides are fat that the body can either burn for energy or store. High levels are closely linked to heart disease. What are the desired levels of cholesterol?  Total cholesterol below 200.  LDL below 100 for people who are at risk, below 70 for people at very high risk.  HDL above 40 is good. A level of 60 or higher is considered to be  protective against heart disease.  Triglycerides below 150. How can I lower my cholesterol? Diet Follow your diet program as told by your health care provider.  Choose fish or white meat chicken and [place], roasted or baked. Limit fatty cuts of red meat, fried foods, and processed meats, such as sausage and lunch meats.  Eat lots of fresh fruits and vegetables.  Choose whole grains, beans, pasta, potatoes, and cereals.  Choose olive oil, corn oil, or canola oil, and use only small amounts.  Avoid butter, mayonnaise, shortening, or palm kernel oils.  Avoid foods with trans fats.  Drink skim or nonfat milk and eat low-fat or nonfat yogurt and cheeses. Avoid whole milk, cream, ice cream, egg yolks, and full-fat cheeses.  Healthier desserts include angel food cake, ginger snaps, animal crackers, hard candy, popsicles, and low-fat or nonfat frozen yogurt. Avoid pastries, cakes, pies, and cookies.  Exercise  Follow your exercise program as told by your health care provider. A regular program: ? Helps to decrease LDL and raise HDL. ? Helps with weight control.  Do things that increase your activity level, such as gardening, walking, and taking the stairs.  Ask your health care provider about ways that you can be more active in your daily life. Medicine  Take over-the-counter and prescription medicines only as told by your health care provider. ? Medicine may be prescribed by your health care provider to help lower cholesterol  and decrease the risk for heart disease. This is usually done if diet and exercise have failed to bring down cholesterol levels. ? If you have several risk factors, you may need medicine even if your levels are normal. This information is not intended to replace advice given to you by your health care provider. Make sure you discuss any questions you have with your health care provider. Document Released: 07/13/2001 Document Revised: 05/15/2016 Document Reviewed:  04/17/2016 Elsevier Interactive Patient Education  [employer].

## 2018-12-27 DIAGNOSIS — E785 Hyperlipidemia, unspecified: Secondary | ICD-10-CM | POA: Insufficient documentation

## 2018-12-27 NOTE — Assessment & Plan Note (Signed)
Doing well on current meds. Allowed refill today

## 2018-12-27 NOTE — Assessment & Plan Note (Signed)
Encouraged heart healthy diet, increase exercise, avoid trans fats, consider a krill oil cap daily 

## 2018-12-27 NOTE — Progress Notes (Signed)
Subjective:    Patient ID: [name], female    DOB: [birth year], 48 y.o.   MRN: [id number]  No chief complaint on file.   HPI Patient is in today for follow up. No recent febrile illness or hospitalizations. She just had labwork done at her GYN office and her cholesterol is up so she has some questions. Denies CP/palp/SOB/HA/congestion/fevers/GI or GU c/o. Taking meds as prescribed  Past Medical History:  Diagnosis Date  . Alopecia   . Anxiety and depression 07/31/2011  . Chest pain 09/22/10   Sharp pains in left breast & sometimes in the right breast as well. Random in occurence & nonexertional. Also has occasional SOB & DOE when going up stairs & occasionally when working out. Nuclear Stress Treadmill 09/29/10 -   . HSV-2 (herpes simplex virus 2) infection   . IBS (irritable bowel syndrome)   . Interstitial cystitis   . Multiple allergies 07/02/2011  . Otitis media 03/16/2012  . Perimenopausal 07/22/2013  . PONV (postoperative nausea and vomiting)   . Poor concentration 07/02/2011  . Shoulder pain, left 04/11/2012  . SOB (shortness of breath) 09/22/10   ECHO 09/29/10 - NL LVF, trivial PE, trivial TR/PR.  Marland Kitchen UTI (lower urinary tract infection) 08/28/2011    Past Surgical History:  Procedure Laterality Date  . BREAST BIOPSY Right 03/12/2016   Procedure: breast exploration;  Surgeon: Jackolyn Confer, MD;  Location: [place];  Service: General;  Laterality: Right;  . BREAST ENHANCEMENT SURGERY  2007  . CESAREAN SECTION  2001 and 2004   x2  . LAPAROSCOPY     twice  . RHINOPLASTY  2003  . TONSILLECTOMY  1982  . TUBAL LIGATION      Family History  Problem Relation Age of Onset  . Heart disease Mother        MVP  . Anxiety disorder Mother   . Mitral valve prolapse Mother   . Hypertension Brother   . Obesity Brother   . Diabetes Brother        DM, obese  . Allergies Daughter   . Allergies Son   . Kidney disease Maternal Grandmother    uremia  . Breast cancer Neg Hx     Social History   Socioeconomic History  . Marital status: Divorced    Spouse name: Not on file  . Number of children: Not on file  . Years of education: Not on file  . Highest education level: Not on file  Occupational History  . Not on file  Social Needs  . Financial resource strain: Not on file  . Food insecurity:    Worry: Not on file    Inability: Not on file  . Transportation needs:    Medical: Not on file    Non-medical: Not on file  Tobacco Use  . Smoking status: Never Smoker  . Smokeless tobacco: Never Used  Substance and Sexual Activity  . Alcohol use: Yes    Alcohol/week: 0.0 standard drinks    Comment: RARELY  . Drug use: No  . Sexual activity: Yes    Birth control/protection: Surgical    Comment: Tubal lig-1st intercourse 48 yo-More than 5 partners  Lifestyle  . Physical activity:    Days per week: Not on file    Minutes per session: Not on file  . Stress: Not on file  Relationships  . Social connections:    Talks on phone: Not on file    Gets together: Not on  file    Attends religious service: Not on file    Active member of club or organization: Not on file    Attends meetings of clubs or organizations: Not on file    Relationship status: Not on file  . Intimate partner violence:    Fear of current or ex partner: Not on file    Emotionally abused: Not on file    Physically abused: Not on file    Forced sexual activity: Not on file  Other Topics Concern  . Not on file  Social History Narrative  . Not on file    Outpatient Medications Prior to Visit  Medication Sig Dispense Refill  . buPROPion (WELLBUTRIN XL) 150 MG 24 hr tablet TAKE 1 TABLET BY MOUTH DAILY X 7 DAYS,THEN START 2 TABLETS DAILY 180 tablet 1  . Cholecalciferol (VITAMIN D3 SUPER STRENGTH) 50 MCG (2000 UT) TABS Take 1 tablet by mouth daily.    Marland Kitchen estradiol (ESTRACE) 0.5 MG tablet Take 2 tablets (1 mg total) by mouth daily. 180 tablet 4  .  fexofenadine (ALLEGRA) 180 MG tablet Take 180 mg by mouth daily.      . finasteride (PROSCAR) 5 MG tablet Take 5 mg by mouth daily. Take 1/2 tab daily    . flunisolide (NASALIDE) 25 MCG/ACT (0.025%) SOLN Please specify directions, refills and quantity 1 Bottle 3  . hydrOXYzine (ATARAX/VISTARIL) 25 MG tablet Take 1 tablet by mouth at bedtime as needed for other. "for interstitial cystitis"    . progesterone (PROMETRIUM) 100 MG capsule Take 1 capsule (100 mg total) by mouth at bedtime. 90 capsule 4  . spironolactone (ALDACTONE) 100 MG tablet Take 100 mg by mouth daily.    Marland Kitchen tretinoin (RETIN-A) 0.025 % cream     . valACYclovir (VALTREX) 500 MG tablet Take 1 tablet (500 mg total) by mouth daily. 90 tablet 4  . amphetamine-dextroamphetamine (ADDERALL) 20 MG tablet Take 1 tablet (20 mg total) by mouth 2 (two) times daily. February 2020 60 tablet 0   No facility-administered medications prior to visit.     No Known Allergies  Review of Systems  Constitutional: Negative for fever and malaise/fatigue.  HENT: Negative for congestion.   Eyes: Negative for blurred vision.  Respiratory: Negative for shortness of breath.   Cardiovascular: Negative for chest pain, palpitations and leg swelling.  Gastrointestinal: Negative for abdominal pain, blood in stool and nausea.  Genitourinary: Negative for dysuria and frequency.  Musculoskeletal: Negative for falls.  Skin: Negative for rash.  Neurological: Negative for dizziness, loss of consciousness and headaches.  Endo/Heme/Allergies: Negative for environmental allergies.  Psychiatric/Behavioral: Negative for depression. The patient is not nervous/anxious.        Objective:    Physical Exam Vitals signs and nursing note reviewed.  Constitutional:      General: She is not in acute distress.    Appearance: She is well-developed.  HENT:     Head: Normocephalic and atraumatic.     Nose: Nose normal.  Eyes:     General:        Right eye: No  discharge.        Left eye: No discharge.  Neck:     Musculoskeletal: Normal range of motion and neck supple.  Cardiovascular:     Rate and Rhythm: Normal rate and regular rhythm.     Heart sounds: No murmur.  Pulmonary:     Effort: Pulmonary effort is normal.     Breath sounds: Normal breath sounds.  Abdominal:  General: Bowel sounds are normal.     Palpations: Abdomen is soft.     Tenderness: There is no abdominal tenderness.  Skin:    General: Skin is warm and dry.  Neurological:     Mental Status: She is alert and oriented to person, place, and time.     BP 98/60 (BP Location: Right Arm, Patient Position: Sitting, Cuff Size: Normal)   Pulse 80   Temp 98.6 F (37 C) (Oral)   Resp 18   Ht 5\' 6"  (1.676 m)   Wt 119 lb 9.6 oz (54.3 kg)   LMP 07/03/2015   SpO2 97%   BMI 19.30 kg/m  Wt Readings from Last 3 Encounters:  12/26/18 119 lb 9.6 oz (54.3 kg)  12/25/18 121 lb (54.9 kg)  09/08/18 123 lb 3.2 oz (55.9 kg)     Lab Results  Component Value Date   WBC 6.2 12/25/2018   HGB 14.3 12/25/2018   HCT 41.9 12/25/2018   PLT 253 12/25/2018   GLUCOSE 96 09/08/2018   CHOL 233 (H) 12/25/2018   TRIG 74 12/25/2018   HDL 60 12/25/2018   LDLCALC 155 (H) 12/25/2018   ALT 9 09/08/2018   AST 11 09/08/2018   NA 140 09/08/2018   K 3.9 09/08/2018   CL 104 09/08/2018   CREATININE 0.70 09/08/2018   BUN 16 09/08/2018   CO2 30 09/08/2018   TSH 1.52 12/23/2017    Lab Results  Component Value Date   TSH 1.52 12/23/2017   Lab Results  Component Value Date   WBC 6.2 12/25/2018   HGB 14.3 12/25/2018   HCT 41.9 12/25/2018   MCV 89.1 12/25/2018   PLT 253 12/25/2018   Lab Results  Component Value Date   NA 140 09/08/2018   K 3.9 09/08/2018   CO2 30 09/08/2018   GLUCOSE 96 09/08/2018   BUN 16 09/08/2018   CREATININE 0.70 09/08/2018   BILITOT 0.7 09/08/2018   ALKPHOS 40 09/08/2018   AST 11 09/08/2018   ALT 9 09/08/2018   PROT 6.8 09/08/2018   ALBUMIN 4.7 09/08/2018    CALCIUM 9.4 09/08/2018   GFR 94.23 09/08/2018   Lab Results  Component Value Date   CHOL 233 (H) 12/25/2018   Lab Results  Component Value Date   HDL 60 12/25/2018   Lab Results  Component Value Date   LDLCALC 155 (H) 12/25/2018   Lab Results  Component Value Date   TRIG 74 12/25/2018   Lab Results  Component Value Date   CHOLHDL 3.9 12/25/2018   No results found for: HGBA1C     Assessment & Plan:   Problem List Items Addressed This Visit    Adult ADHD    Doing well on current meds. Allowed refill today      Vitamin D deficiency    Supplement and monitor      Hyperlipidemia    Encouraged heart healthy diet, increase exercise, avoid trans fats, consider a krill oil cap daily         I have changed [name] "Blanca"'s amphetamine-dextroamphetamine. I am also having her maintain her fexofenadine, tretinoin, finasteride, hydrOXYzine, Cholecalciferol, flunisolide, buPROPion, spironolactone, valACYclovir, progesterone, and estradiol.  Meds ordered this encounter  Medications  . amphetamine-dextroamphetamine (ADDERALL) 20 MG tablet    Sig: Take 1 tablet (20 mg total) by mouth 2 (two) times daily. March 2020    Dispense:  60 tablet    Refill:  0     Penni Homans, MD

## 2018-12-27 NOTE — Assessment & Plan Note (Signed)
Supplement and monitor 

## 2019-01-01 ENCOUNTER — Ambulatory Visit: Payer: BLUE CROSS/BLUE SHIELD | Admitting: Internal Medicine

## 2019-01-01 ENCOUNTER — Encounter: Payer: Self-pay | Admitting: Internal Medicine

## 2019-01-01 VITALS — BP 116/70 | HR 91 | Temp 98.1°F | Resp 16 | Ht 66.0 in | Wt 124.4 lb

## 2019-01-01 DIAGNOSIS — M545 Low back pain, unspecified: Secondary | ICD-10-CM

## 2019-01-01 MED ORDER — MELOXICAM 7.5 MG PO TABS: 7.5000 mg | ORAL_TABLET | Freq: Every day | ORAL | 0 refills | Status: DC

## 2019-01-01 MED ORDER — CYCLOBENZAPRINE HCL 10 MG PO TABS: 10.0000 mg | ORAL_TABLET | Freq: Every evening | ORAL | 0 refills | Status: DC | PRN

## 2019-01-01 NOTE — Progress Notes (Signed)
Subjective:    Patient ID: [name], female    DOB: 1971/05/31, 48 y.o.   MRN: [id number]  DOS:  01/01/2019 Type of visit - description: Acute visit Symptoms of started 3 weeks ago with consistent discomfort for the right low back, some radiation to the lateral thigh and anterior pretibial area. The pain was worse or better depending on her position. She started stretching and took a leftover Flexeril and today she feels better.   Review of Systems  No feve or chills No injury  Question of numbness and tingling on the lower extremity 1 time 3 days ago.  Nothing persistent.   Past Medical History:  Diagnosis Date  . Alopecia   . Anxiety and depression 07/31/2011  . Chest pain 09/22/10   Sharp pains in left breast & sometimes in the right breast as well. Random in occurence & nonexertional. Also has occasional SOB & DOE when going up stairs & occasionally when working out. Nuclear Stress Treadmill 09/29/10 -   . HSV-2 (herpes simplex virus 2) infection   . IBS (irritable bowel syndrome)   . Interstitial cystitis   . Multiple allergies 07/02/2011  . Otitis media 03/16/2012  . Perimenopausal 07/22/2013  . PONV (postoperative nausea and vomiting)   . Poor concentration 07/02/2011  . Shoulder pain, left 04/11/2012  . SOB (shortness of breath) 09/22/10   ECHO 09/29/10 - NL LVF, trivial PE, trivial TR/PR.  Marland Kitchen UTI (lower urinary tract infection) 08/28/2011    Past Surgical History:  Procedure Laterality Date  . BREAST BIOPSY Right 03/12/2016   Procedure: breast exploration;  Surgeon: Jackolyn Confer, MD;  Location: [place];  Service: General;  Laterality: Right;  . BREAST ENHANCEMENT SURGERY  2007  . CESAREAN SECTION  2001 and 2004   x2  . LAPAROSCOPY     twice  . RHINOPLASTY  2003  . TONSILLECTOMY  1982  . TUBAL LIGATION      Social History   Socioeconomic History  . Marital status: Divorced    Spouse name: Not on file  . Number of children:  Not on file  . Years of education: Not on file  . Highest education level: Not on file  Occupational History  . Not on file  Social Needs  . Financial resource strain: Not on file  . Food insecurity:    Worry: Not on file    Inability: Not on file  . Transportation needs:    Medical: Not on file    Non-medical: Not on file  Tobacco Use  . Smoking status: Never Smoker  . Smokeless tobacco: Never Used  Substance and Sexual Activity  . Alcohol use: Yes    Alcohol/week: 0.0 standard drinks    Comment: RARELY  . Drug use: No  . Sexual activity: Yes    Birth control/protection: Surgical    Comment: Tubal lig-1st intercourse 48 yo-More than 5 partners  Lifestyle  . Physical activity:    Days per week: Not on file    Minutes per session: Not on file  . Stress: Not on file  Relationships  . Social connections:    Talks on phone: Not on file    Gets together: Not on file    Attends religious service: Not on file    Active member of club or organization: Not on file    Attends meetings of clubs or organizations: Not on file    Relationship status: Not on file  . Intimate partner violence:  Fear of current or ex partner: Not on file    Emotionally abused: Not on file    Physically abused: Not on file    Forced sexual activity: Not on file  Other Topics Concern  . Not on file  Social History Narrative  . Not on file      Allergies as of 01/01/2019   No Known Allergies     Medication List       Accurate as of January 01, 2019  9:40 PM. Always use your most recent med list.        amphetamine-dextroamphetamine 20 MG tablet Commonly known as:  ADDERALL Take 1 tablet (20 mg total) by mouth 2 (two) times daily. March 2020   buPROPion 150 MG 24 hr tablet Commonly known as:  WELLBUTRIN XL TAKE 1 TABLET BY MOUTH DAILY X 7 DAYS,THEN START 2 TABLETS DAILY   cyclobenzaprine 10 MG tablet Commonly known as:  FLEXERIL Take 1 tablet (10 mg total) by mouth at bedtime as needed  for muscle spasms.   estradiol 0.5 MG tablet Commonly known as:  ESTRACE Take 2 tablets (1 mg total) by mouth daily.   fexofenadine 180 MG tablet Commonly known as:  ALLEGRA Take 180 mg by mouth daily.   finasteride 5 MG tablet Commonly known as:  PROSCAR Take 5 mg by mouth daily. Take 1/2 tab daily   flunisolide 25 MCG/ACT (0.025%) Soln Commonly known as:  NASALIDE Please specify directions, refills and quantity   hydrOXYzine 25 MG tablet Commonly known as:  ATARAX/VISTARIL Take 1 tablet by mouth at bedtime as needed for other. "for interstitial cystitis"   meloxicam 7.5 MG tablet Commonly known as:  MOBIC Take 1-2 tablets (7.5-15 mg total) by mouth daily.   progesterone 100 MG capsule Commonly known as:  PROMETRIUM Take 1 capsule (100 mg total) by mouth at bedtime.   spironolactone 100 MG tablet Commonly known as:  ALDACTONE Take 100 mg by mouth daily.   tretinoin 0.025 % cream Commonly known as:  RETIN-A   valACYclovir 500 MG tablet Commonly known as:  VALTREX Take 1 tablet (500 mg total) by mouth daily.   VITAMIN D3 SUPER STRENGTH 50 MCG (2000 UT) Tabs Generic drug:  Cholecalciferol Take 1 tablet by mouth daily.           Objective:   Physical Exam BP 116/70 (BP Location: Left Arm, Patient Position: Sitting, Cuff Size: Small)   Pulse 91   Temp 98.1 F (36.7 C) (Oral)   Resp 16   Ht 5\' 6"  (1.676 m)   Wt 124 lb 6 oz (56.4 kg)   LMP 07/03/2015   SpO2 98%   BMI 20.07 kg/m  General:   Well developed, NAD, BMI noted. HEENT:  Normocephalic . Face symmetric, atraumatic MSK: No TTP at the lumbar spine or SI joints.   Skin: Not pale. Not jaundice Neurologic:  alert & oriented X3.  Speech normal, gait appropriate for age and unassisted DTRs symmetric except for a question of decrease ankle jerk on the right. Straight leg test negative. Motor symmetric, no antalgic posture Psych--  Cognition and judgment appear intact.  Cooperative with normal  attention span and concentration.  Behavior appropriate. No anxious or depressed appearing.      Assessment     48 year old female, PMH includes perimenopausal state on HRT , IBS, adult ADHD, anxiety depression.  Presents with:  Back pain: Symptoms of started 3 weeks ago, physical exam is normal except for a question of  decreased right ankle jerk.  Suspect a back sprain, also in the differential a right L4 or 5 radiculopathy. Plan: Stretching, request PT referral, will do. Flexeril at night  Meloxicam, GI precautions discussed. Call if not gradually better.

## 2019-01-01 NOTE — Patient Instructions (Addendum)
We are referring you to physical therapy, [facility]  Flexeril as needed, please take it at night, will cause drowsiness  Meloxicam, 1 or 2 tablets a day.  Always take it with food because may cause gastritis and ulcers.  If you notice nausea, stomach pain, change in the color of stools --->  Stop the medicine and let us know  Heating pad once or twice a day for 10 minutes   Call if no better in 2-3 weeks

## 2019-01-01 NOTE — Progress Notes (Signed)
Pre visit review using our clinic review tool, if applicable. No additional management support is needed unless otherwise documented below in the visit note. 

## 2019-01-16 ENCOUNTER — Other Ambulatory Visit: Payer: Self-pay

## 2019-01-16 ENCOUNTER — Ambulatory Visit: Payer: BLUE CROSS/BLUE SHIELD | Admitting: Family

## 2019-01-16 ENCOUNTER — Encounter: Payer: Self-pay | Admitting: Family

## 2019-01-16 VITALS — BP 97/50 | HR 89 | Temp 98.5°F | Resp 16 | Wt 121.0 lb

## 2019-01-16 DIAGNOSIS — L03012 Cellulitis of left finger: Secondary | ICD-10-CM

## 2019-01-16 MED ORDER — CEPHALEXIN 500 MG PO CAPS: 500.0000 mg | ORAL_CAPSULE | Freq: Two times a day (BID) | ORAL | 0 refills | Status: DC

## 2019-01-16 NOTE — Patient Instructions (Signed)
Please begin keflex. Call if increased pain/swelling or drainage. Call if not improved in 2-3 days.   Paronychia Paronychia is an infection of the skin. It happens near a fingernail or toenail. It may cause pain and swelling around the nail. In some cases, a fluid-filled bump (abscess) can form near or under the nail. Usually, this condition is not serious, and it clears up with treatment. Follow these instructions at home: Wound care  Keep the affected area clean.  Soak the fingers or toes in warm water as told by your doctor. You may be told to do this for 20 minutes, 2-3 times a day.  Keep the area dry when you are not soaking it.  Do not try to drain a fluid-filled bump on your own.  Follow instructions from your doctor about how to take care of the affected area. Make sure you: ? Wash your hands with soap and water before you change your bandage (dressing). If you cannot use soap and water, use hand sanitizer. ? Change your bandage as told by your doctor.  If you had a fluid-filled bump and your doctor drained it, check the area every day for signs of infection. Check for: ? Redness, swelling, or pain. ? Fluid or blood. ? Warmth. ? Pus or a bad smell. Medicines   Take over-the-counter and prescription medicines only as told by your doctor.  If you were prescribed an antibiotic medicine, take it as told by your doctor. Do not stop taking it even if you start to feel better. General instructions  Avoid touching any chemicals.  Do not pick at the affected area. Prevention  To prevent this condition from happening again: ? Wear rubber gloves when putting your hands in water for washing dishes or other tasks. ? Wear gloves if your hands might touch cleaners or chemicals. ? Avoid injuring your nails or fingertips. ? Do not bite your nails or tear hangnails. ? Do not cut your nails very short. ? Do not cut the skin at the base and sides of the nail (cuticles). ? Use clean  nail clippers or scissors when trimming nails. Contact a doctor if:  You feel worse.  You do not get better.  You have more fluid, blood, or pus coming from the affected area.  Your finger or knuckle is swollen or is hard to move. Get help right away if you have:  A fever or chills.  Redness spreading from the affected area.  Pain in a joint or muscle. Summary  Paronychia is an infection of the skin. It happens near a fingernail or toenail.  This condition may cause pain and swelling around the nail.  Soak the fingers or toes in warm water as told by your doctor.  Usually, this condition is not serious, and it clears up with treatment. This information is not intended to replace advice given to you by your health care provider. Make sure you discuss any questions you have with your health care provider. Document Released: 10/06/2009 Document Revised: 10/31/2017 Document Reviewed: 10/31/2017 Elsevier Interactive Patient Education  2019 [employer].

## 2019-01-16 NOTE — Progress Notes (Signed)
Subjective:    Patient ID: [name], female    DOB: August 03, 1971, 48 y.o.   MRN: [id number]  HPI  Patient is a 48 yr old female who presents today with chief complaint of pain/swelling of the skin surrounding the nail on her left index finger.  Reports that symptoms have been present for several days. Has been applying neosporin without improvement in her symptoms.   Review of Systems See HPI  Past Medical History:  Diagnosis Date  . Alopecia   . Anxiety and depression 07/31/2011  . Chest pain 09/22/10   Sharp pains in left breast & sometimes in the right breast as well. Random in occurence & nonexertional. Also has occasional SOB & DOE when going up stairs & occasionally when working out. Nuclear Stress Treadmill 09/29/10 -   . HSV-2 (herpes simplex virus 2) infection   . IBS (irritable bowel syndrome)   . Interstitial cystitis   . Multiple allergies 07/02/2011  . Otitis media 03/16/2012  . Perimenopausal 07/22/2013  . PONV (postoperative nausea and vomiting)   . Poor concentration 07/02/2011  . Shoulder pain, left 04/11/2012  . SOB (shortness of breath) 09/22/10   ECHO 09/29/10 - NL LVF, trivial PE, trivial TR/PR.  Marland Kitchen UTI (lower urinary tract infection) 08/28/2011     Social History   Socioeconomic History  . Marital status: Divorced    Spouse name: Not on file  . Number of children: Not on file  . Years of education: Not on file  . Highest education level: Not on file  Occupational History  . Not on file  Social Needs  . Financial resource strain: Not on file  . Food insecurity:    Worry: Not on file    Inability: Not on file  . Transportation needs:    Medical: Not on file    Non-medical: Not on file  Tobacco Use  . Smoking status: Never Smoker  . Smokeless tobacco: Never Used  Substance and Sexual Activity  . Alcohol use: Yes    Alcohol/week: 0.0 standard drinks    Comment: RARELY  . Drug use: No  . Sexual activity: Yes    Birth  control/protection: Surgical    Comment: Tubal lig-1st intercourse 48 yo-More than 5 partners  Lifestyle  . Physical activity:    Days per week: Not on file    Minutes per session: Not on file  . Stress: Not on file  Relationships  . Social connections:    Talks on phone: Not on file    Gets together: Not on file    Attends religious service: Not on file    Active member of club or organization: Not on file    Attends meetings of clubs or organizations: Not on file    Relationship status: Not on file  . Intimate partner violence:    Fear of current or ex partner: Not on file    Emotionally abused: Not on file    Physically abused: Not on file    Forced sexual activity: Not on file  Other Topics Concern  . Not on file  Social History Narrative  . Not on file    Past Surgical History:  Procedure Laterality Date  . BREAST BIOPSY Right 03/12/2016   Procedure: breast exploration;  Surgeon: Jackolyn Confer, MD;  Location: [place];  Service: General;  Laterality: Right;  . BREAST ENHANCEMENT SURGERY  2007  . CESAREAN SECTION  2001 and 2004   x2  .  LAPAROSCOPY     twice  . RHINOPLASTY  2003  . TONSILLECTOMY  1982  . TUBAL LIGATION      Family History  Problem Relation Age of Onset  . Heart disease Mother        MVP  . Anxiety disorder Mother   . Mitral valve prolapse Mother   . Hypertension Brother   . Obesity Brother   . Diabetes Brother        DM, obese  . Allergies Daughter   . Allergies Son   . Kidney disease Maternal Grandmother        uremia  . Breast cancer Neg Hx     No Known Allergies  Current Outpatient Medications on File Prior to Visit  Medication Sig Dispense Refill  . amphetamine-dextroamphetamine (ADDERALL) 20 MG tablet Take 1 tablet (20 mg total) by mouth 2 (two) times daily. March 2020 60 tablet 0  . buPROPion (WELLBUTRIN XL) 150 MG 24 hr tablet TAKE 1 TABLET BY MOUTH DAILY X 7 DAYS,THEN START 2 TABLETS DAILY (Patient taking  differently: Take 300 mg by mouth daily. ) 180 tablet 1  . Cholecalciferol (VITAMIN D3 SUPER STRENGTH) 50 MCG (2000 UT) TABS Take 1 tablet by mouth daily.    . cyclobenzaprine (FLEXERIL) 10 MG tablet Take 1 tablet (10 mg total) by mouth at bedtime as needed for muscle spasms. 21 tablet 0  . estradiol (ESTRACE) 0.5 MG tablet Take 2 tablets (1 mg total) by mouth daily. 180 tablet 4  . fexofenadine (ALLEGRA) 180 MG tablet Take 180 mg by mouth daily.      . finasteride (PROSCAR) 5 MG tablet Take 5 mg by mouth daily. Take 1/2 tab daily    . flunisolide (NASALIDE) 25 MCG/ACT (0.025%) SOLN Please specify directions, refills and quantity 1 Bottle 3  . hydrOXYzine (ATARAX/VISTARIL) 25 MG tablet Take 1 tablet by mouth at bedtime as needed for other. "for interstitial cystitis"    . meloxicam (MOBIC) 7.5 MG tablet Take 1-2 tablets (7.5-15 mg total) by mouth daily. 30 tablet 0  . progesterone (PROMETRIUM) 100 MG capsule Take 1 capsule (100 mg total) by mouth at bedtime. 90 capsule 4  . spironolactone (ALDACTONE) 100 MG tablet Take 100 mg by mouth daily.    Marland Kitchen tretinoin (RETIN-A) 0.025 % cream     . valACYclovir (VALTREX) 500 MG tablet Take 1 tablet (500 mg total) by mouth daily. 90 tablet 4   No current facility-administered medications on file prior to visit.     BP (!) 97/50 (BP Location: Right Arm, Patient Position: Sitting, Cuff Size: Small)   Pulse 89   Temp 98.5 F (36.9 C) (Oral)   Resp 16   Wt 121 lb (54.9 kg)   LMP 07/03/2015   SpO2 100%   BMI 19.53 kg/m       Objective:   Physical Exam Constitutional:      Appearance: Normal appearance.  HENT:     Head: Normocephalic and atraumatic.  Pulmonary:     Effort: Pulmonary effort is normal.  Skin:    General: Skin is warm and dry.     Comments: + mild erythema and swelling surrounding the base of the left index fingernail. No drainage or obvious abscess noted.   Neurological:     Mental Status: She is alert.  Psychiatric:         Mood and Affect: Mood normal.        Behavior: Behavior normal.  Assessment & Plan:  Paronychia-  Pt is advised as follows:  Please begin keflex. Call if increased pain/swelling or drainage. Call if not improved in 2-3 days.

## 2019-02-27 DIAGNOSIS — N3010 Interstitial cystitis (chronic) without hematuria: Secondary | ICD-10-CM | POA: Diagnosis not present

## 2019-03-08 ENCOUNTER — Other Ambulatory Visit: Payer: Self-pay | Admitting: Family Medicine

## 2019-03-13 ENCOUNTER — Encounter: Payer: BLUE CROSS/BLUE SHIELD | Admitting: Family Medicine

## 2019-05-21 ENCOUNTER — Ambulatory Visit (INDEPENDENT_AMBULATORY_CARE_PROVIDER_SITE_OTHER): Payer: BC Managed Care – PPO | Admitting: Family

## 2019-05-21 DIAGNOSIS — S161XXA Strain of muscle, fascia and tendon at neck level, initial encounter: Secondary | ICD-10-CM | POA: Diagnosis not present

## 2019-05-21 MED ORDER — MELOXICAM 7.5 MG PO TABS: 7.5000 mg | ORAL_TABLET | Freq: Every day | ORAL | 0 refills | Status: DC

## 2019-05-21 MED ORDER — CYCLOBENZAPRINE HCL 10 MG PO TABS: 10.0000 mg | ORAL_TABLET | Freq: Every evening | ORAL | 0 refills | Status: DC | PRN

## 2019-05-21 NOTE — Progress Notes (Signed)
Virtual Visit via Video Note  I connected with [name] on 05/21/19 at  9:20 AM EDT by a video enabled telemedicine application and verified that I am speaking with the correct person using two identifiers.  Location: Patient: home Provider: home   I discussed the limitations of evaluation and management by telemedicine and the availability of in person appointments. The patient expressed understanding and agreed to proceed.  History of Present Illness:  Patient is a 48 yr old female who presents today with chief complaint of neck pain.  Pt reports that neck pain has been present for 2 weeks. She describes the pain as a stiffness/tightness at the back/top of her neck.  Pain is worsened by flexion/extension.  She denies associated numbness, weakness or radicular pain.  Has not tried any medications. She denies any known injury.   Past Medical History:  Diagnosis Date  . Alopecia   . Anxiety and depression 07/31/2011  . Chest pain 09/22/10   Sharp pains in left breast & sometimes in the right breast as well. Random in occurence & nonexertional. Also has occasional SOB & DOE when going up stairs & occasionally when working out. Nuclear Stress Treadmill 09/29/10 -   . HSV-2 (herpes simplex virus 2) infection   . IBS (irritable bowel syndrome)   . Interstitial cystitis   . Multiple allergies 07/02/2011  . Otitis media 03/16/2012  . Perimenopausal 07/22/2013  . PONV (postoperative nausea and vomiting)   . Poor concentration 07/02/2011  . Shoulder pain, left 04/11/2012  . SOB (shortness of breath) 09/22/10   ECHO 09/29/10 - NL LVF, trivial PE, trivial TR/PR.  Marland Kitchen UTI (lower urinary tract infection) 08/28/2011     Social History   Socioeconomic History  . Marital status: Divorced    Spouse name: Not on file  . Number of children: Not on file  . Years of education: Not on file  . Highest education level: Not on file  Occupational History  . Not on file  Social Needs  .  Financial resource strain: Not on file  . Food insecurity    Worry: Not on file    Inability: Not on file  . Transportation needs    Medical: Not on file    Non-medical: Not on file  Tobacco Use  . Smoking status: Never Smoker  . Smokeless tobacco: Never Used  Substance and Sexual Activity  . Alcohol use: Yes    Alcohol/week: 0.0 standard drinks    Comment: RARELY  . Drug use: No  . Sexual activity: Yes    Birth control/protection: Surgical    Comment: Tubal lig-1st intercourse 48 yo-More than 5 partners  Lifestyle  . Physical activity    Days per week: Not on file    Minutes per session: Not on file  . Stress: Not on file  Relationships  . Social Herbalist on phone: Not on file    Gets together: Not on file    Attends religious service: Not on file    Active member of club or organization: Not on file    Attends meetings of clubs or organizations: Not on file    Relationship status: Not on file  . Intimate partner violence    Fear of current or ex partner: Not on file    Emotionally abused: Not on file    Physically abused: Not on file    Forced sexual activity: Not on file  Other Topics Concern  . Not on  file  Social History Narrative  . Not on file    Past Surgical History:  Procedure Laterality Date  . BREAST BIOPSY Right 03/12/2016   Procedure: breast exploration;  Surgeon: Jackolyn Confer, MD;  Location: [place];  Service: General;  Laterality: Right;  . BREAST ENHANCEMENT SURGERY  2007  . CESAREAN SECTION  2001 and 2004   x2  . LAPAROSCOPY     twice  . RHINOPLASTY  2003  . TONSILLECTOMY  1982  . TUBAL LIGATION      Family History  Problem Relation Age of Onset  . Heart disease Mother        MVP  . Anxiety disorder Mother   . Mitral valve prolapse Mother   . Hypertension Brother   . Obesity Brother   . Diabetes Brother        DM, obese  . Allergies Daughter   . Allergies Son   . Kidney disease Maternal Grandmother         uremia  . Breast cancer Neg Hx     No Known Allergies  Current Outpatient Medications on File Prior to Visit  Medication Sig Dispense Refill  . amphetamine-dextroamphetamine (ADDERALL) 20 MG tablet Take 1 tablet (20 mg total) by mouth 2 (two) times daily. March 2020 60 tablet 0  . buPROPion (WELLBUTRIN XL) 150 MG 24 hr tablet Take 2 tablets (300 mg total) by mouth daily. 90 tablet 1  . Cholecalciferol (VITAMIN D3 SUPER STRENGTH) 50 MCG (2000 UT) TABS Take 1 tablet by mouth daily.    Marland Kitchen estradiol (ESTRACE) 0.5 MG tablet Take 2 tablets (1 mg total) by mouth daily. 180 tablet 4  . fexofenadine (ALLEGRA) 180 MG tablet Take 180 mg by mouth daily.      . finasteride (PROSCAR) 5 MG tablet Take 5 mg by mouth daily. Take 1/2 tab daily    . flunisolide (NASALIDE) 25 MCG/ACT (0.025%) SOLN Please specify directions, refills and quantity 1 Bottle 3  . hydrOXYzine (ATARAX/VISTARIL) 25 MG tablet Take 1 tablet by mouth at bedtime as needed for other. "for interstitial cystitis"    . meloxicam (MOBIC) 7.5 MG tablet Take 1-2 tablets (7.5-15 mg total) by mouth daily. 30 tablet 0  . progesterone (PROMETRIUM) 100 MG capsule Take 1 capsule (100 mg total) by mouth at bedtime. 90 capsule 4  . spironolactone (ALDACTONE) 100 MG tablet Take 100 mg by mouth daily.    Marland Kitchen tretinoin (RETIN-A) 0.025 % cream     . valACYclovir (VALTREX) 500 MG tablet Take 1 tablet (500 mg total) by mouth daily. 90 tablet 4  . cephALEXin (KEFLEX) 500 MG capsule Take 1 capsule (500 mg total) by mouth 2 (two) times daily. (Patient not taking: Reported on 05/21/2019) 14 capsule 0   No current facility-administered medications on file prior to visit.     LMP 07/03/2015        Observations/Objective:   Gen: Awake, alert, no acute distress Resp: Breathing is even and non-labored Psych: calm/pleasant demeanor Neuro: Alert and Oriented x 3, + facial symmetry, speech is clear.   Assessment and Plan:  Cervical strain-  Advised pt  on trial of short course of meloxicam and HS flexeril. She is advised to call if symptoms worsen or if they fail to improve.   Follow Up Instructions:    I discussed the assessment and treatment plan with the patient. The patient was provided an opportunity to ask questions and all were answered. The patient agreed with the  plan and demonstrated an understanding of the instructions.   The patient was advised to call back or seek an in-person evaluation if the symptoms worsen or if the condition fails to improve as anticipated.  Nance Pear, NP

## 2019-05-25 ENCOUNTER — Other Ambulatory Visit: Payer: Self-pay | Admitting: Family Medicine

## 2019-05-30 ENCOUNTER — Other Ambulatory Visit: Payer: Self-pay | Admitting: Family Medicine

## 2019-05-31 ENCOUNTER — Ambulatory Visit: Payer: BLUE CROSS/BLUE SHIELD | Admitting: Family Medicine

## 2019-05-31 DIAGNOSIS — Z0289 Encounter for other administrative examinations: Secondary | ICD-10-CM

## 2019-06-13 ENCOUNTER — Telehealth: Payer: Self-pay | Admitting: Family Medicine

## 2019-06-13 NOTE — Telephone Encounter (Signed)
Pt called stating she received a message and a mychart notification she would be contacted before 05/31/2019 appt to notify her if virtual or in person. She did not receive a call so she did not come in. Pt states that she should not be charged for this.

## 2019-06-14 NOTE — Telephone Encounter (Signed)
No charge but she does need a visit to keep her meds up to date

## 2019-06-14 NOTE — Telephone Encounter (Signed)
She is scheduled for VV 8/25

## 2019-06-26 ENCOUNTER — Other Ambulatory Visit: Payer: Self-pay

## 2019-06-26 ENCOUNTER — Ambulatory Visit (INDEPENDENT_AMBULATORY_CARE_PROVIDER_SITE_OTHER): Payer: BC Managed Care – PPO | Admitting: Family Medicine

## 2019-06-26 VITALS — Wt 120.0 lb

## 2019-06-26 DIAGNOSIS — F419 Anxiety disorder, unspecified: Secondary | ICD-10-CM

## 2019-06-26 DIAGNOSIS — Z1211 Encounter for screening for malignant neoplasm of colon: Secondary | ICD-10-CM

## 2019-06-26 DIAGNOSIS — F909 Attention-deficit hyperactivity disorder, unspecified type: Secondary | ICD-10-CM | POA: Diagnosis not present

## 2019-06-26 DIAGNOSIS — E785 Hyperlipidemia, unspecified: Secondary | ICD-10-CM | POA: Diagnosis not present

## 2019-06-26 DIAGNOSIS — L659 Nonscarring hair loss, unspecified: Secondary | ICD-10-CM | POA: Diagnosis not present

## 2019-06-26 DIAGNOSIS — E559 Vitamin D deficiency, unspecified: Secondary | ICD-10-CM

## 2019-06-26 DIAGNOSIS — R002 Palpitations: Secondary | ICD-10-CM

## 2019-06-26 DIAGNOSIS — F329 Major depressive disorder, single episode, unspecified: Secondary | ICD-10-CM

## 2019-06-26 MED ORDER — FINASTERIDE 5 MG PO TABS: 5.0000 mg | ORAL_TABLET | Freq: Every day | ORAL | 1 refills | Status: DC

## 2019-06-26 MED ORDER — CYCLOBENZAPRINE HCL 10 MG PO TABS: 5.0000 mg | ORAL_TABLET | Freq: Every evening | ORAL | 1 refills | Status: DC | PRN

## 2019-06-26 MED ORDER — AMPHETAMINE-DEXTROAMPHETAMINE 20 MG PO TABS: 20.0000 mg | ORAL_TABLET | Freq: Two times a day (BID) | ORAL | 0 refills | Status: DC

## 2019-06-27 DIAGNOSIS — L659 Nonscarring hair loss, unspecified: Secondary | ICD-10-CM | POA: Insufficient documentation

## 2019-06-27 NOTE — Assessment & Plan Note (Signed)
Doing well and has dropped her Wellbutrin XL to 150 mg po daily. Is considering decreasing further. Can prescribe a lower dose if needed.

## 2019-06-27 NOTE — Assessment & Plan Note (Signed)
Supplement and monitor 

## 2019-06-27 NOTE — Assessment & Plan Note (Signed)
Given refill on Finasteride

## 2019-06-27 NOTE — Assessment & Plan Note (Signed)
Encouraged heart healthy diet, increase exercise, avoid trans fats, consider a krill oil cap daily 

## 2019-06-27 NOTE — Progress Notes (Signed)
Subjective:    Patient ID: [name], female    DOB: Jan 26, 1971, 48 y.o.   MRN: [id number]  No chief complaint on file.   HPI Patient is in today for follow up on chronic medical concerns inclduing attention deficit disorder, hyperlipidemia and depression. She is doing well during quarantine. No recent febrile illness or hospitalizations. Denies CP/palp/SOB/HA/congestion/fevers/GI or GU c/o. Taking meds as prescribed  Past Medical History:  Diagnosis Date  . Alopecia   . Anxiety and depression 07/31/2011  . Chest pain 09/22/10   Sharp pains in left breast & sometimes in the right breast as well. Random in occurence & nonexertional. Also has occasional SOB & DOE when going up stairs & occasionally when working out. Nuclear Stress Treadmill 09/29/10 -   . HSV-2 (herpes simplex virus 2) infection   . IBS (irritable bowel syndrome)   . Interstitial cystitis   . Multiple allergies 07/02/2011  . Otitis media 03/16/2012  . Perimenopausal 07/22/2013  . PONV (postoperative nausea and vomiting)   . Poor concentration 07/02/2011  . Shoulder pain, left 04/11/2012  . SOB (shortness of breath) 09/22/10   ECHO 09/29/10 - NL LVF, trivial PE, trivial TR/PR.  Marland Kitchen UTI (lower urinary tract infection) 08/28/2011    Past Surgical History:  Procedure Laterality Date  . BREAST BIOPSY Right 03/12/2016   Procedure: breast exploration;  Surgeon: Jackolyn Confer, MD;  Location: [place];  Service: General;  Laterality: Right;  . BREAST ENHANCEMENT SURGERY  2007  . CESAREAN SECTION  2001 and 2004   x2  . LAPAROSCOPY     twice  . RHINOPLASTY  2003  . TONSILLECTOMY  1982  . TUBAL LIGATION      Family History  Problem Relation Age of Onset  . Heart disease Mother        MVP  . Anxiety disorder Mother   . Mitral valve prolapse Mother   . Hypertension Brother   . Obesity Brother   . Diabetes Brother        DM, obese  . Allergies Daughter   . Allergies Son   . Kidney  disease Maternal Grandmother        uremia  . Breast cancer Neg Hx     Social History   Socioeconomic History  . Marital status: Divorced    Spouse name: Not on file  . Number of children: Not on file  . Years of education: Not on file  . Highest education level: Not on file  Occupational History  . Not on file  Social Needs  . Financial resource strain: Not on file  . Food insecurity    Worry: Not on file    Inability: Not on file  . Transportation needs    Medical: Not on file    Non-medical: Not on file  Tobacco Use  . Smoking status: Never Smoker  . Smokeless tobacco: Never Used  Substance and Sexual Activity  . Alcohol use: Yes    Alcohol/week: 0.0 standard drinks    Comment: RARELY  . Drug use: No  . Sexual activity: Yes    Birth control/protection: Surgical    Comment: Tubal lig-1st intercourse 48 yo-More than 5 partners  Lifestyle  . Physical activity    Days per week: Not on file    Minutes per session: Not on file  . Stress: Not on file  Relationships  . Social Herbalist on phone: Not on file    Gets  together: Not on file    Attends religious service: Not on file    Active member of club or organization: Not on file    Attends meetings of clubs or organizations: Not on file    Relationship status: Not on file  . Intimate partner violence    Fear of current or ex partner: Not on file    Emotionally abused: Not on file    Physically abused: Not on file    Forced sexual activity: Not on file  Other Topics Concern  . Not on file  Social History Narrative  . Not on file    Outpatient Medications Prior to Visit  Medication Sig Dispense Refill  . buPROPion (WELLBUTRIN XL) 150 MG 24 hr tablet TAKE 2 TABLETS (300 MG TOTAL) BY MOUTH DAILY. 180 tablet 0  . Cholecalciferol (VITAMIN D3 SUPER STRENGTH) 50 MCG (2000 UT) TABS Take 1 tablet by mouth daily.    Marland Kitchen estradiol (ESTRACE) 0.5 MG tablet Take 2 tablets (1 mg total) by mouth daily. 180 tablet 4   . fexofenadine (ALLEGRA) 180 MG tablet Take 180 mg by mouth daily.      . flunisolide (NASALIDE) 25 MCG/ACT (0.025%) SOLN Please specify directions, refills and quantity 1 Bottle 3  . hydrOXYzine (ATARAX/VISTARIL) 25 MG tablet Take 1 tablet by mouth at bedtime as needed for other. "for interstitial cystitis"    . meloxicam (MOBIC) 7.5 MG tablet Take 1 tablet (7.5 mg total) by mouth daily. 14 tablet 0  . progesterone (PROMETRIUM) 100 MG capsule Take 1 capsule (100 mg total) by mouth at bedtime. 90 capsule 4  . spironolactone (ALDACTONE) 100 MG tablet Take 100 mg by mouth daily.    Marland Kitchen tretinoin (RETIN-A) 0.025 % cream     . valACYclovir (VALTREX) 500 MG tablet Take 1 tablet (500 mg total) by mouth daily. 90 tablet 4  . amphetamine-dextroamphetamine (ADDERALL) 20 MG tablet Take 1 tablet (20 mg total) by mouth 2 (two) times daily. March 2020 60 tablet 0  . cyclobenzaprine (FLEXERIL) 10 MG tablet Take 1 tablet (10 mg total) by mouth at bedtime as needed for muscle spasms. 21 tablet 0  . finasteride (PROSCAR) 5 MG tablet Take 5 mg by mouth daily. Take 1/2 tab daily    . cephALEXin (KEFLEX) 500 MG capsule Take 1 capsule (500 mg total) by mouth 2 (two) times daily. (Patient not taking: Reported on 05/21/2019) 14 capsule 0   No facility-administered medications prior to visit.     No Known Allergies  Review of Systems  Constitutional: Negative for fever and malaise/fatigue.  HENT: Negative for congestion.   Eyes: Negative for blurred vision.  Respiratory: Negative for shortness of breath.   Cardiovascular: Negative for chest pain, palpitations and leg swelling.  Gastrointestinal: Negative for abdominal pain, blood in stool and nausea.  Genitourinary: Negative for dysuria and frequency.  Musculoskeletal: Negative for falls.  Skin: Negative for rash.  Neurological: Negative for dizziness, loss of consciousness and headaches.  Endo/Heme/Allergies: Negative for environmental allergies.   Psychiatric/Behavioral: Negative for depression. The patient is nervous/anxious.        Objective:    Physical Exam Constitutional:      Appearance: Normal appearance. She is not ill-appearing.  HENT:     Head: Normocephalic and atraumatic.     Nose: Nose normal.  Eyes:     General:        Right eye: No discharge.        Left eye: No discharge.  Pulmonary:  Effort: Pulmonary effort is normal.  Neurological:     Mental Status: She is alert and oriented to person, place, and time.  Psychiatric:        Mood and Affect: Mood normal.        Behavior: Behavior normal.     Wt 120 lb (54.4 kg)   LMP 07/03/2015   BMI 19.37 kg/m  Wt Readings from Last 3 Encounters:  06/26/19 120 lb (54.4 kg)  01/16/19 121 lb (54.9 kg)  01/01/19 124 lb 6 oz (56.4 kg)    Diabetic Foot Exam - Simple   No data filed     Lab Results  Component Value Date   WBC 6.2 12/25/2018   HGB 14.3 12/25/2018   HCT 41.9 12/25/2018   PLT 253 12/25/2018   GLUCOSE 96 09/08/2018   CHOL 233 (H) 12/25/2018   TRIG 74 12/25/2018   HDL 60 12/25/2018   LDLCALC 155 (H) 12/25/2018   ALT 9 09/08/2018   AST 11 09/08/2018   NA 140 09/08/2018   K 3.9 09/08/2018   CL 104 09/08/2018   CREATININE 0.70 09/08/2018   BUN 16 09/08/2018   CO2 30 09/08/2018   TSH 1.52 12/23/2017    Lab Results  Component Value Date   TSH 1.52 12/23/2017   Lab Results  Component Value Date   WBC 6.2 12/25/2018   HGB 14.3 12/25/2018   HCT 41.9 12/25/2018   MCV 89.1 12/25/2018   PLT 253 12/25/2018   Lab Results  Component Value Date   NA 140 09/08/2018   K 3.9 09/08/2018   CO2 30 09/08/2018   GLUCOSE 96 09/08/2018   BUN 16 09/08/2018   CREATININE 0.70 09/08/2018   BILITOT 0.7 09/08/2018   ALKPHOS 40 09/08/2018   AST 11 09/08/2018   ALT 9 09/08/2018   PROT 6.8 09/08/2018   ALBUMIN 4.7 09/08/2018   CALCIUM 9.4 09/08/2018   GFR 94.23 09/08/2018   Lab Results  Component Value Date   CHOL 233 (H) 12/25/2018    Lab Results  Component Value Date   HDL 60 12/25/2018   Lab Results  Component Value Date   LDLCALC 155 (H) 12/25/2018   Lab Results  Component Value Date   TRIG 74 12/25/2018   Lab Results  Component Value Date   CHOLHDL 3.9 12/25/2018   No results found for: HGBA1C     Assessment & Plan:   Problem List Items Addressed This Visit    Adult ADHD    Given refills on Adderall      Vitamin D deficiency    Supplement and monitor      Anxiety and depression    Doing well and has dropped her Wellbutrin XL to 150 mg po daily. Is considering decreasing further. Can prescribe a lower dose if needed.       Hyperlipidemia    Encouraged heart healthy diet, increase exercise, avoid trans fats, consider a krill oil cap daily      Alopecia    Given refill on Finasteride          I have discontinued Katherine J. Yingst "Blanca"'s cephALEXin. I have also changed her finasteride, amphetamine-dextroamphetamine, and cyclobenzaprine. Additionally, I am having her start on amphetamine-dextroamphetamine. Lastly, I am having her maintain her fexofenadine, tretinoin, hydrOXYzine, Cholecalciferol, flunisolide, spironolactone, valACYclovir, progesterone, estradiol, meloxicam, and buPROPion.  Meds ordered this encounter  Medications  . finasteride (PROSCAR) 5 MG tablet    Sig: Take 1 tablet (5 mg total) by mouth  daily.    Dispense:  90 tablet    Refill:  1  . amphetamine-dextroamphetamine (ADDERALL) 20 MG tablet    Sig: Take 1 tablet (20 mg total) by mouth 2 (two) times daily. October 2020    Dispense:  60 tablet    Refill:  0  . cyclobenzaprine (FLEXERIL) 10 MG tablet    Sig: Take 0.5-1 tablets (5-10 mg total) by mouth at bedtime as needed for muscle spasms.    Dispense:  30 tablet    Refill:  1  . amphetamine-dextroamphetamine (ADDERALL) 20 MG tablet    Sig: Take 1 tablet (20 mg total) by mouth 2 (two) times daily. September 2020    Dispense:  60 tablet    Refill:  0      Penni Homans, MD

## 2019-06-27 NOTE — Assessment & Plan Note (Signed)
Given refills on Adderall

## 2019-06-28 DIAGNOSIS — M9902 Segmental and somatic dysfunction of thoracic region: Secondary | ICD-10-CM | POA: Diagnosis not present

## 2019-06-28 DIAGNOSIS — M5384 Other specified dorsopathies, thoracic region: Secondary | ICD-10-CM | POA: Diagnosis not present

## 2019-06-28 DIAGNOSIS — M9901 Segmental and somatic dysfunction of cervical region: Secondary | ICD-10-CM | POA: Diagnosis not present

## 2019-07-02 ENCOUNTER — Telehealth: Payer: Self-pay | Admitting: Family Medicine

## 2019-07-02 DIAGNOSIS — R002 Palpitations: Secondary | ICD-10-CM

## 2019-07-02 DIAGNOSIS — E559 Vitamin D deficiency, unspecified: Secondary | ICD-10-CM

## 2019-07-02 DIAGNOSIS — E785 Hyperlipidemia, unspecified: Secondary | ICD-10-CM

## 2019-07-02 NOTE — Telephone Encounter (Signed)
Labs placed NV for Flu shot

## 2019-07-02 NOTE — Telephone Encounter (Signed)
AVS from 8/25 says pt needing lab and nurse visit in the next couple of weeks. There are no lab orders in. What is the nurse visit for? Please advise.

## 2019-07-03 DIAGNOSIS — M5384 Other specified dorsopathies, thoracic region: Secondary | ICD-10-CM | POA: Diagnosis not present

## 2019-07-03 DIAGNOSIS — M9901 Segmental and somatic dysfunction of cervical region: Secondary | ICD-10-CM | POA: Diagnosis not present

## 2019-07-03 DIAGNOSIS — M5032 Other cervical disc degeneration, mid-cervical region, unspecified level: Secondary | ICD-10-CM | POA: Diagnosis not present

## 2019-07-03 DIAGNOSIS — M9902 Segmental and somatic dysfunction of thoracic region: Secondary | ICD-10-CM | POA: Diagnosis not present

## 2019-07-03 NOTE — Telephone Encounter (Signed)
LM for pt to call and schedule lab appt and flu shot

## 2019-07-05 DIAGNOSIS — M5384 Other specified dorsopathies, thoracic region: Secondary | ICD-10-CM | POA: Diagnosis not present

## 2019-07-05 DIAGNOSIS — M5032 Other cervical disc degeneration, mid-cervical region, unspecified level: Secondary | ICD-10-CM | POA: Diagnosis not present

## 2019-07-05 DIAGNOSIS — M9901 Segmental and somatic dysfunction of cervical region: Secondary | ICD-10-CM | POA: Diagnosis not present

## 2019-07-05 DIAGNOSIS — M9902 Segmental and somatic dysfunction of thoracic region: Secondary | ICD-10-CM | POA: Diagnosis not present

## 2019-07-06 ENCOUNTER — Telehealth: Payer: Self-pay | Admitting: Family Medicine

## 2019-07-06 NOTE — Telephone Encounter (Signed)
Labs orders placed made nurse visit appt and scheduled appt for Shingrix

## 2019-07-06 NOTE — Telephone Encounter (Signed)
We spoke of this already Kinsa thermometer and Omron BP cuff

## 2019-07-06 NOTE — Addendum Note (Signed)
Addended by: Magdalene Molly A on: 07/06/2019 10:06 AM   Modules accepted: Orders

## 2019-07-10 NOTE — Telephone Encounter (Signed)
Sent patient a message via my chart.  

## 2019-07-11 DIAGNOSIS — M9902 Segmental and somatic dysfunction of thoracic region: Secondary | ICD-10-CM | POA: Diagnosis not present

## 2019-07-11 DIAGNOSIS — M5384 Other specified dorsopathies, thoracic region: Secondary | ICD-10-CM | POA: Diagnosis not present

## 2019-07-11 DIAGNOSIS — M9901 Segmental and somatic dysfunction of cervical region: Secondary | ICD-10-CM | POA: Diagnosis not present

## 2019-07-11 DIAGNOSIS — M5032 Other cervical disc degeneration, mid-cervical region, unspecified level: Secondary | ICD-10-CM | POA: Diagnosis not present

## 2019-07-12 DIAGNOSIS — M5384 Other specified dorsopathies, thoracic region: Secondary | ICD-10-CM | POA: Diagnosis not present

## 2019-07-12 DIAGNOSIS — M5032 Other cervical disc degeneration, mid-cervical region, unspecified level: Secondary | ICD-10-CM | POA: Diagnosis not present

## 2019-07-12 DIAGNOSIS — M9902 Segmental and somatic dysfunction of thoracic region: Secondary | ICD-10-CM | POA: Diagnosis not present

## 2019-07-12 DIAGNOSIS — M9901 Segmental and somatic dysfunction of cervical region: Secondary | ICD-10-CM | POA: Diagnosis not present

## 2019-07-18 ENCOUNTER — Ambulatory Visit: Payer: BC Managed Care – PPO

## 2019-07-18 DIAGNOSIS — M9901 Segmental and somatic dysfunction of cervical region: Secondary | ICD-10-CM | POA: Diagnosis not present

## 2019-07-18 DIAGNOSIS — M9902 Segmental and somatic dysfunction of thoracic region: Secondary | ICD-10-CM | POA: Diagnosis not present

## 2019-07-18 DIAGNOSIS — M5032 Other cervical disc degeneration, mid-cervical region, unspecified level: Secondary | ICD-10-CM | POA: Diagnosis not present

## 2019-07-18 DIAGNOSIS — M5384 Other specified dorsopathies, thoracic region: Secondary | ICD-10-CM | POA: Diagnosis not present

## 2019-07-19 DIAGNOSIS — M5384 Other specified dorsopathies, thoracic region: Secondary | ICD-10-CM | POA: Diagnosis not present

## 2019-07-19 DIAGNOSIS — M5032 Other cervical disc degeneration, mid-cervical region, unspecified level: Secondary | ICD-10-CM | POA: Diagnosis not present

## 2019-07-19 DIAGNOSIS — M9902 Segmental and somatic dysfunction of thoracic region: Secondary | ICD-10-CM | POA: Diagnosis not present

## 2019-07-19 DIAGNOSIS — M9901 Segmental and somatic dysfunction of cervical region: Secondary | ICD-10-CM | POA: Diagnosis not present

## 2019-07-25 ENCOUNTER — Encounter: Payer: Self-pay | Admitting: Gynecology

## 2019-07-25 DIAGNOSIS — H5213 Myopia, bilateral: Secondary | ICD-10-CM | POA: Diagnosis not present

## 2019-07-25 DIAGNOSIS — M9902 Segmental and somatic dysfunction of thoracic region: Secondary | ICD-10-CM | POA: Diagnosis not present

## 2019-07-25 DIAGNOSIS — M5032 Other cervical disc degeneration, mid-cervical region, unspecified level: Secondary | ICD-10-CM | POA: Diagnosis not present

## 2019-07-25 DIAGNOSIS — M5384 Other specified dorsopathies, thoracic region: Secondary | ICD-10-CM | POA: Diagnosis not present

## 2019-07-25 DIAGNOSIS — H04122 Dry eye syndrome of left lacrimal gland: Secondary | ICD-10-CM | POA: Diagnosis not present

## 2019-07-25 DIAGNOSIS — M9901 Segmental and somatic dysfunction of cervical region: Secondary | ICD-10-CM | POA: Diagnosis not present

## 2019-07-25 DIAGNOSIS — H04121 Dry eye syndrome of right lacrimal gland: Secondary | ICD-10-CM | POA: Diagnosis not present

## 2019-07-26 ENCOUNTER — Other Ambulatory Visit: Payer: Self-pay

## 2019-07-26 ENCOUNTER — Other Ambulatory Visit (INDEPENDENT_AMBULATORY_CARE_PROVIDER_SITE_OTHER): Payer: BC Managed Care – PPO

## 2019-07-26 DIAGNOSIS — M9901 Segmental and somatic dysfunction of cervical region: Secondary | ICD-10-CM | POA: Diagnosis not present

## 2019-07-26 DIAGNOSIS — M9902 Segmental and somatic dysfunction of thoracic region: Secondary | ICD-10-CM | POA: Diagnosis not present

## 2019-07-26 DIAGNOSIS — E559 Vitamin D deficiency, unspecified: Secondary | ICD-10-CM | POA: Diagnosis not present

## 2019-07-26 DIAGNOSIS — M5384 Other specified dorsopathies, thoracic region: Secondary | ICD-10-CM | POA: Diagnosis not present

## 2019-07-26 DIAGNOSIS — R002 Palpitations: Secondary | ICD-10-CM | POA: Diagnosis not present

## 2019-07-26 DIAGNOSIS — M5032 Other cervical disc degeneration, mid-cervical region, unspecified level: Secondary | ICD-10-CM | POA: Diagnosis not present

## 2019-07-26 DIAGNOSIS — E785 Hyperlipidemia, unspecified: Secondary | ICD-10-CM | POA: Diagnosis not present

## 2019-07-26 LAB — LIPID PANEL
Cholesterol: 181 mg/dL (ref 0–200)
HDL: 53.1 mg/dL (ref >39.00)
LDL Cholesterol: 116 mg/dL — ABNORMAL HIGH (ref 0–99)
NonHDL: 128.35
Total CHOL/HDL Ratio: 3
Triglycerides: 64 mg/dL (ref 0.0–149.0)
VLDL: 12.8 mg/dL (ref 0.0–40.0)

## 2019-07-26 LAB — COMPREHENSIVE METABOLIC PANEL
ALT: 11 U/L (ref 0–35)
AST: 14 U/L (ref 0–37)
Albumin: 4.7 g/dL (ref 3.5–5.2)
Alkaline Phosphatase: 41 U/L (ref 39–117)
BUN: 20 mg/dL (ref 6–23)
CO2: 30 mEq/L (ref 19–32)
Calcium: 9.8 mg/dL (ref 8.4–10.5)
Chloride: 103 mEq/L (ref 96–112)
Creatinine, Ser: 0.85 mg/dL (ref 0.40–1.20)
GFR: 70.61 mL/min (ref >60.00)
Glucose, Bld: 97 mg/dL (ref 70–99)
Potassium: 4.1 mEq/L (ref 3.5–5.1)
Sodium: 139 mEq/L (ref 135–145)
Total Bilirubin: 0.7 mg/dL (ref 0.2–1.2)
Total Protein: 6.7 g/dL (ref 6.0–8.3)

## 2019-07-26 LAB — CBC
HCT: 39.9 % (ref 36.0–46.0)
Hemoglobin: 13.5 g/dL (ref 12.0–15.0)
MCHC: 33.8 g/dL (ref 30.0–36.0)
MCV: 90.2 fl (ref 78.0–100.0)
Platelets: 210 10*3/uL (ref 150.0–400.0)
RBC: 4.43 Mil/uL (ref 3.87–5.11)
RDW: 12.7 % (ref 11.5–15.5)
WBC: 5.6 10*3/uL (ref 4.0–10.5)

## 2019-07-26 LAB — VITAMIN D 25 HYDROXY (VIT D DEFICIENCY, FRACTURES): VITD: 38.68 ng/mL (ref 30.00–100.00)

## 2019-07-26 LAB — TSH: TSH: 2.3 u[IU]/mL (ref 0.35–4.50)

## 2019-08-01 DIAGNOSIS — M9901 Segmental and somatic dysfunction of cervical region: Secondary | ICD-10-CM | POA: Diagnosis not present

## 2019-08-01 DIAGNOSIS — M9902 Segmental and somatic dysfunction of thoracic region: Secondary | ICD-10-CM | POA: Diagnosis not present

## 2019-08-01 DIAGNOSIS — M5032 Other cervical disc degeneration, mid-cervical region, unspecified level: Secondary | ICD-10-CM | POA: Diagnosis not present

## 2019-08-01 DIAGNOSIS — M5384 Other specified dorsopathies, thoracic region: Secondary | ICD-10-CM | POA: Diagnosis not present

## 2019-08-07 ENCOUNTER — Encounter: Payer: Self-pay | Admitting: Family Medicine

## 2019-08-08 DIAGNOSIS — M9901 Segmental and somatic dysfunction of cervical region: Secondary | ICD-10-CM | POA: Diagnosis not present

## 2019-08-08 DIAGNOSIS — M5032 Other cervical disc degeneration, mid-cervical region, unspecified level: Secondary | ICD-10-CM | POA: Diagnosis not present

## 2019-08-08 DIAGNOSIS — M9902 Segmental and somatic dysfunction of thoracic region: Secondary | ICD-10-CM | POA: Diagnosis not present

## 2019-08-08 DIAGNOSIS — M5384 Other specified dorsopathies, thoracic region: Secondary | ICD-10-CM | POA: Diagnosis not present

## 2019-08-16 ENCOUNTER — Ambulatory Visit (INDEPENDENT_AMBULATORY_CARE_PROVIDER_SITE_OTHER): Payer: BC Managed Care – PPO | Admitting: Family Medicine

## 2019-08-16 ENCOUNTER — Other Ambulatory Visit: Payer: Self-pay

## 2019-08-16 DIAGNOSIS — F909 Attention-deficit hyperactivity disorder, unspecified type: Secondary | ICD-10-CM

## 2019-08-16 DIAGNOSIS — F419 Anxiety disorder, unspecified: Secondary | ICD-10-CM

## 2019-08-16 DIAGNOSIS — E785 Hyperlipidemia, unspecified: Secondary | ICD-10-CM | POA: Diagnosis not present

## 2019-08-16 DIAGNOSIS — E559 Vitamin D deficiency, unspecified: Secondary | ICD-10-CM | POA: Diagnosis not present

## 2019-08-16 DIAGNOSIS — L659 Nonscarring hair loss, unspecified: Secondary | ICD-10-CM

## 2019-08-16 DIAGNOSIS — F329 Major depressive disorder, single episode, unspecified: Secondary | ICD-10-CM

## 2019-08-16 MED ORDER — ALPRAZOLAM 0.25 MG PO TABS: 0.2500 mg | ORAL_TABLET | Freq: Two times a day (BID) | ORAL | 0 refills | Status: DC | PRN

## 2019-08-16 MED ORDER — CITALOPRAM HYDROBROMIDE 10 MG PO TABS: 10.0000 mg | ORAL_TABLET | Freq: Every day | ORAL | 2 refills | Status: DC

## 2019-08-19 NOTE — Progress Notes (Signed)
Patient ID: [name], female   DOB: [birth year], 48 y.o.   MRN: [id number] Virtual Visit via Video Note  I connected with [name] on 08/16/19 at  3:00 PM EDT by a video enabled telemedicine application and verified that I am speaking with the correct person using two identifiers.  Location: Patient: home Provider: office   I discussed the limitations of evaluation and management by telemedicine and the availability of in person appointments. The patient expressed understanding and agreed to proceed. Magdalene Molly, CMA was able to get the patient set up on Video visit   Subjective:    Patient ID: [name], female    DOB: 1971/04/29, 48 y.o.   MRN: [id number]  No chief complaint on file.   HPI Patient is in today for follow up on chronic medical concerns including depression, anxiety, ADHD, vitamin D deficiency and more. No recent febrile illness or hospitalizations. She is very stressed over loosing her job and thus her medical coverage. Also her college age daughter is back home studying at home and more. She has even had an anxiety attack noted with heart racing and more. Denies CP/palp/SOB/HA/congestion/fevers/GI or GU c/o. Taking meds as prescribed  Past Medical History:  Diagnosis Date  . Alopecia   . Anxiety and depression 07/31/2011  . Chest pain 09/22/10   Sharp pains in left breast & sometimes in the right breast as well. Random in occurence & nonexertional. Also has occasional SOB & DOE when going up stairs & occasionally when working out. Nuclear Stress Treadmill 09/29/10 -   . HSV-2 (herpes simplex virus 2) infection   . IBS (irritable bowel syndrome)   . Interstitial cystitis   . Multiple allergies 07/02/2011  . Otitis media 03/16/2012  . Perimenopausal 07/22/2013  . PONV (postoperative nausea and vomiting)   . Poor concentration 07/02/2011  . Shoulder pain, left 04/11/2012  . SOB (shortness of breath) 09/22/10   ECHO 09/29/10 -  NL LVF, trivial PE, trivial TR/PR.  Marland Kitchen UTI (lower urinary tract infection) 08/28/2011    Past Surgical History:  Procedure Laterality Date  . BREAST BIOPSY Right 03/12/2016   Procedure: breast exploration;  Surgeon: Jackolyn Confer, MD;  Location: [facility];  Service: General;  Laterality: Right;  . BREAST ENHANCEMENT SURGERY  2007  . CESAREAN SECTION  2001 and 2004   x2  . LAPAROSCOPY     twice  . RHINOPLASTY  2003  . TONSILLECTOMY  1982  . TUBAL LIGATION      Family History  Problem Relation Age of Onset  . Heart disease Mother        MVP  . Anxiety disorder Mother   . Mitral valve prolapse Mother   . Hypertension Brother   . Obesity Brother   . Diabetes Brother        DM, obese  . Allergies Daughter   . Allergies Son   . Kidney disease Maternal Grandmother        uremia  . Breast cancer Neg Hx     Social History   Socioeconomic History  . Marital status: Divorced    Spouse name: Not on file  . Number of children: Not on file  . Years of education: Not on file  . Highest education level: Not on file  Occupational History  . Not on file  Social Needs  . Financial resource strain: Not on file  . Food insecurity    Worry: Not on file  Inability: Not on file  . Transportation needs    Medical: Not on file    Non-medical: Not on file  Tobacco Use  . Smoking status: Never Smoker  . Smokeless tobacco: Never Used  Substance and Sexual Activity  . Alcohol use: Yes    Alcohol/week: 0.0 standard drinks    Comment: RARELY  . Drug use: No  . Sexual activity: Yes    Birth control/protection: Surgical    Comment: Tubal lig-1st intercourse 48 yo-More than 5 partners  Lifestyle  . Physical activity    Days per week: Not on file    Minutes per session: Not on file  . Stress: Not on file  Relationships  . Social Herbalist on phone: Not on file    Gets together: Not on file    Attends religious service: Not on file    Active member of  club or organization: Not on file    Attends meetings of clubs or organizations: Not on file    Relationship status: Not on file  . Intimate partner violence    Fear of current or ex partner: Not on file    Emotionally abused: Not on file    Physically abused: Not on file    Forced sexual activity: Not on file  Other Topics Concern  . Not on file  Social History Narrative  . Not on file    Outpatient Medications Prior to Visit  Medication Sig Dispense Refill  . amphetamine-dextroamphetamine (ADDERALL) 20 MG tablet Take 1 tablet (20 mg total) by mouth 2 (two) times daily. October 2020 60 tablet 0  . amphetamine-dextroamphetamine (ADDERALL) 20 MG tablet Take 1 tablet (20 mg total) by mouth 2 (two) times daily. September 2020 60 tablet 0  . buPROPion (WELLBUTRIN XL) 150 MG 24 hr tablet TAKE 2 TABLETS (300 MG TOTAL) BY MOUTH DAILY. 180 tablet 0  . Cholecalciferol (VITAMIN D3 SUPER STRENGTH) 50 MCG (2000 UT) TABS Take 1 tablet by mouth daily.    . cyclobenzaprine (FLEXERIL) 10 MG tablet Take 0.5-1 tablets (5-10 mg total) by mouth at bedtime as needed for muscle spasms. 30 tablet 1  . estradiol (ESTRACE) 0.5 MG tablet Take 2 tablets (1 mg total) by mouth daily. 180 tablet 4  . fexofenadine (ALLEGRA) 180 MG tablet Take 180 mg by mouth daily.      . finasteride (PROSCAR) 5 MG tablet Take 1 tablet (5 mg total) by mouth daily. 90 tablet 1  . flunisolide (NASALIDE) 25 MCG/ACT (0.025%) SOLN Please specify directions, refills and quantity 1 Bottle 3  . hydrOXYzine (ATARAX/VISTARIL) 25 MG tablet Take 1 tablet by mouth at bedtime as needed for other. "for interstitial cystitis"    . progesterone (PROMETRIUM) 100 MG capsule Take 1 capsule (100 mg total) by mouth at bedtime. 90 capsule 4  . spironolactone (ALDACTONE) 100 MG tablet Take 100 mg by mouth daily.    Marland Kitchen tretinoin (RETIN-A) 0.025 % cream     . valACYclovir (VALTREX) 500 MG tablet Take 1 tablet (500 mg total) by mouth daily. 90 tablet 4  .  meloxicam (MOBIC) 7.5 MG tablet Take 1 tablet (7.5 mg total) by mouth daily. 14 tablet 0   No facility-administered medications prior to visit.     No Known Allergies  Review of Systems  Constitutional: Positive for malaise/fatigue. Negative for fever.  HENT: Negative for congestion.   Eyes: Negative for blurred vision.  Respiratory: Negative for shortness of breath.   Cardiovascular: Negative for  chest pain, palpitations and leg swelling.  Gastrointestinal: Negative for abdominal pain, blood in stool and nausea.  Genitourinary: Negative for dysuria and frequency.  Musculoskeletal: Negative for falls.  Skin: Negative for rash.  Neurological: Negative for dizziness, loss of consciousness and headaches.  Endo/Heme/Allergies: Negative for environmental allergies.  Psychiatric/Behavioral: Positive for depression. The patient is nervous/anxious.        Objective:    Physical Exam Constitutional:      Appearance: Normal appearance.  HENT:     Head: Normocephalic and atraumatic.     Right Ear: External ear normal.     Left Ear: External ear normal.     Nose: Nose normal.  Eyes:     General:        Right eye: No discharge.        Left eye: No discharge.  Pulmonary:     Effort: Pulmonary effort is normal.  Neurological:     Mental Status: She is alert and oriented to person, place, and time.  Psychiatric:        Mood and Affect: Mood normal.        Behavior: Behavior normal.     LMP 07/03/2015  Wt Readings from Last 3 Encounters:  06/26/19 120 lb (54.4 kg)  01/16/19 121 lb (54.9 kg)  01/01/19 124 lb 6 oz (56.4 kg)    Diabetic Foot Exam - Simple   No data filed     Lab Results  Component Value Date   WBC 5.6 07/26/2019   HGB 13.5 07/26/2019   HCT 39.9 07/26/2019   PLT 210.0 07/26/2019   GLUCOSE 97 07/26/2019   CHOL 181 07/26/2019   TRIG 64.0 07/26/2019   HDL 53.10 07/26/2019   LDLCALC 116 (H) 07/26/2019   ALT 11 07/26/2019   AST 14 07/26/2019   NA 139  07/26/2019   K 4.1 07/26/2019   CL 103 07/26/2019   CREATININE 0.85 07/26/2019   BUN 20 07/26/2019   CO2 30 07/26/2019   TSH 2.30 07/26/2019    Lab Results  Component Value Date   TSH 2.30 07/26/2019   Lab Results  Component Value Date   WBC 5.6 07/26/2019   HGB 13.5 07/26/2019   HCT 39.9 07/26/2019   MCV 90.2 07/26/2019   PLT 210.0 07/26/2019   Lab Results  Component Value Date   NA 139 07/26/2019   K 4.1 07/26/2019   CO2 30 07/26/2019   GLUCOSE 97 07/26/2019   BUN 20 07/26/2019   CREATININE 0.85 07/26/2019   BILITOT 0.7 07/26/2019   ALKPHOS 41 07/26/2019   AST 14 07/26/2019   ALT 11 07/26/2019   PROT 6.7 07/26/2019   ALBUMIN 4.7 07/26/2019   CALCIUM 9.8 07/26/2019   GFR 70.61 07/26/2019   Lab Results  Component Value Date   CHOL 181 07/26/2019   Lab Results  Component Value Date   HDL 53.10 07/26/2019   Lab Results  Component Value Date   LDLCALC 116 (H) 07/26/2019   Lab Results  Component Value Date   TRIG 64.0 07/26/2019   Lab Results  Component Value Date   CHOLHDL 3 07/26/2019   No results found for: HGBA1C     Assessment & Plan:   Problem List Items Addressed This Visit    Adult ADHD    Stable on current meds      Vitamin D deficiency    WNL at last check, supplement and monitor      Anxiety and depression  Struggling with a lost job, kid studying at home in college and more. Restart Citalopram 10 mg po daily, use Alprazolam very sparingly for anxiety attacks      Relevant Medications   citalopram (CELEXA) 10 MG tablet   ALPRAZolam (XANAX) 0.25 MG tablet   Hyperlipidemia    Encouraged heart healthy diet, increase exercise, avoid trans fats, consider a krill oil cap daily      Alopecia    Tolerating Propecia.          I have discontinued Carloyn J. Roddey "Blanca"'s meloxicam. I am also having her start on citalopram and ALPRAZolam. Additionally, I am having her maintain her fexofenadine, tretinoin, hydrOXYzine,  Cholecalciferol, flunisolide, spironolactone, valACYclovir, progesterone, estradiol, buPROPion, finasteride, amphetamine-dextroamphetamine, cyclobenzaprine, and amphetamine-dextroamphetamine.  Meds ordered this encounter  Medications  . citalopram (CELEXA) 10 MG tablet    Sig: Take 1 tablet (10 mg total) by mouth daily.    Dispense:  30 tablet    Refill:  2  . ALPRAZolam (XANAX) 0.25 MG tablet    Sig: Take 1 tablet (0.25 mg total) by mouth 2 (two) times daily as needed for anxiety.    Dispense:  10 tablet    Refill:  0    I discussed the assessment and treatment plan with the patient. The patient was provided an opportunity to ask questions and all were answered. The patient agreed with the plan and demonstrated an understanding of the instructions.   The patient was advised to call back or seek an in-person evaluation if the symptoms worsen or if the condition fails to improve as anticipated.  I provided 25 minutes of non-face-to-face time during this encounter.   Penni Homans, MD

## 2019-08-19 NOTE — Assessment & Plan Note (Signed)
Stable on current meds 

## 2019-08-19 NOTE — Assessment & Plan Note (Signed)
Encouraged heart healthy diet, increase exercise, avoid trans fats, consider a krill oil cap daily 

## 2019-08-19 NOTE — Assessment & Plan Note (Signed)
Struggling with a lost job, kid studying at home in college and more. Restart Citalopram 10 mg po daily, use Alprazolam very sparingly for anxiety attacks

## 2019-08-19 NOTE — Assessment & Plan Note (Signed)
Tolerating Propecia.

## 2019-08-19 NOTE — Assessment & Plan Note (Signed)
WNL at last check, supplement and monitor

## 2019-08-20 ENCOUNTER — Telehealth: Payer: Self-pay | Admitting: Family Medicine

## 2019-08-20 NOTE — Telephone Encounter (Signed)
LM to schedule f/u 6-10 weeks.

## 2019-08-29 DIAGNOSIS — M9901 Segmental and somatic dysfunction of cervical region: Secondary | ICD-10-CM | POA: Diagnosis not present

## 2019-08-29 DIAGNOSIS — M5032 Other cervical disc degeneration, mid-cervical region, unspecified level: Secondary | ICD-10-CM | POA: Diagnosis not present

## 2019-08-29 DIAGNOSIS — M9902 Segmental and somatic dysfunction of thoracic region: Secondary | ICD-10-CM | POA: Diagnosis not present

## 2019-08-29 DIAGNOSIS — M5384 Other specified dorsopathies, thoracic region: Secondary | ICD-10-CM | POA: Diagnosis not present

## 2019-08-31 ENCOUNTER — Other Ambulatory Visit: Payer: Self-pay | Admitting: Family Medicine

## 2019-08-31 DIAGNOSIS — Z889 Allergy status to unspecified drugs, medicaments and biological substances status: Secondary | ICD-10-CM

## 2019-09-11 ENCOUNTER — Other Ambulatory Visit: Payer: Self-pay | Admitting: Family Medicine

## 2019-09-28 ENCOUNTER — Other Ambulatory Visit: Payer: Self-pay | Admitting: Family Medicine

## 2019-10-04 ENCOUNTER — Other Ambulatory Visit: Payer: Self-pay | Admitting: Obstetrics and Gynecology

## 2019-10-04 DIAGNOSIS — Z1231 Encounter for screening mammogram for malignant neoplasm of breast: Secondary | ICD-10-CM

## 2019-10-17 ENCOUNTER — Other Ambulatory Visit: Payer: Self-pay | Admitting: Family Medicine

## 2019-10-18 NOTE — Telephone Encounter (Signed)
Last OV 08/16/19 Last refill 05/30/19 #180/0 Next OV not scheduled

## 2019-11-26 ENCOUNTER — Ambulatory Visit: Payer: BC Managed Care – PPO

## 2019-11-30 ENCOUNTER — Other Ambulatory Visit: Payer: Self-pay

## 2019-11-30 ENCOUNTER — Ambulatory Visit (INDEPENDENT_AMBULATORY_CARE_PROVIDER_SITE_OTHER): Payer: 59 | Admitting: Medical

## 2019-11-30 ENCOUNTER — Encounter: Payer: Self-pay | Admitting: Medical

## 2019-11-30 VITALS — BP 113/73 | HR 76 | Ht 66.0 in | Wt 122.0 lb

## 2019-11-30 DIAGNOSIS — R7989 Other specified abnormal findings of blood chemistry: Secondary | ICD-10-CM

## 2019-11-30 DIAGNOSIS — R5383 Other fatigue: Secondary | ICD-10-CM

## 2019-11-30 NOTE — Patient Instructions (Signed)
For recent 3 weeks of fatigue will get labs today. Ordered cbc, cmp, tsh, vit d, b12, b1 and iron level.  Recommend continue to exercise and eat healthy.  Will update you on lab results by my chart.   Follow date to determined pending lab review.

## 2019-11-30 NOTE — Progress Notes (Signed)
   Subjective:    Patient ID: [name], female    DOB: 1971/10/27, 49 y.o.   MRN: [id number]  HPI   Virtual Visit via Video Note  I connected with [name] on 11/30/19 at  8:00 AM EST by a video enabled telemedicine application and verified that I am speaking with the correct person using two identifiers.  Location: Patient: home Provider: home   I discussed the limitations of evaluation and management by telemedicine and the availability of in person appointments. The patient expressed understanding and agreed to proceed.  History of Present Illness: Pt states 3 weeks of fatigue. Sleeping adequate. About 6-7 hours. Some stress but not extreme. Just states feel like no energy. Does not report hx of this before. Not reporting depression.  Pt just started exercising. Working out 2 days a week. She is doing some weights.   She is working from home. She states eat healthy.  LMP- menopause. States no longer has menses  Observations/Objective: General-no acute distress, pleasant, oriented. Lungs- on inspection lungs appear unlabored. Neck- no tracheal deviation or jvd on inspection. Neuro- gross motor function appears intact.  Assessment and Plan: For recent 3 weeks of fatigue will get labs today. Ordered cbc, cmp, tsh, vit d, b12, b1 and iron level.  Recommend continue to exercise and eat healthy.  Will update you on lab results by my chart.   Follow date to determined pending lab review.  Mackie Pai, PA-C  Follow Up Instructions:    I discussed the assessment and treatment plan with the patient. The patient was provided an opportunity to ask questions and all were answered. The patient agreed with the plan and demonstrated an understanding of the instructions.   The patient was advised to call back or seek an in-person evaluation if the symptoms worsen or if the condition fails to improve as anticipated.  I provided 20 minutes of  non-face-to-face time during this encounter.   Mackie Pai, PA-C     Review of Systems  Constitutional: Positive for fatigue. Negative for chills and fever.  Respiratory: Negative for cough, chest tightness, shortness of breath and wheezing.   Cardiovascular: Negative for chest pain and palpitations.  Gastrointestinal: Negative for abdominal distention, abdominal pain and constipation.  Musculoskeletal: Negative for back pain.  Skin: Negative for rash.  Neurological: Negative for dizziness and headaches.  Hematological: Negative for adenopathy. Does not bruise/bleed easily.  Psychiatric/Behavioral: Negative for behavioral problems, confusion, dysphoric mood and sleep disturbance. The patient is not nervous/anxious.        Objective:   Physical Exam        Assessment & Plan:

## 2019-12-03 ENCOUNTER — Telehealth: Payer: Self-pay

## 2019-12-03 ENCOUNTER — Other Ambulatory Visit (INDEPENDENT_AMBULATORY_CARE_PROVIDER_SITE_OTHER): Payer: 59

## 2019-12-03 DIAGNOSIS — R7989 Other specified abnormal findings of blood chemistry: Secondary | ICD-10-CM

## 2019-12-03 DIAGNOSIS — R5383 Other fatigue: Secondary | ICD-10-CM

## 2019-12-03 LAB — CBC
HCT: 37.9 % (ref 36.0–46.0)
Hemoglobin: 12.6 g/dL (ref 12.0–15.0)
MCHC: 33.3 g/dL (ref 30.0–36.0)
MCV: 88.7 fl (ref 78.0–100.0)
Platelets: 190 10*3/uL (ref 150.0–400.0)
RBC: 4.28 Mil/uL (ref 3.87–5.11)
RDW: 12.7 % (ref 11.5–15.5)
WBC: 4.2 10*3/uL (ref 4.0–10.5)

## 2019-12-03 NOTE — Addendum Note (Signed)
Addended by: Wynonia Musty A on: 12/03/2019 05:07 PM   Modules accepted: Orders

## 2019-12-04 LAB — IRON: Iron: 89 ug/dL (ref 42–145)

## 2019-12-04 LAB — COMPREHENSIVE METABOLIC PANEL
ALT: 15 U/L (ref 0–35)
AST: 15 U/L (ref 0–37)
Albumin: 4.3 g/dL (ref 3.5–5.2)
Alkaline Phosphatase: 35 U/L — ABNORMAL LOW (ref 39–117)
BUN: 21 mg/dL (ref 6–23)
CO2: 32 mEq/L (ref 19–32)
Calcium: 9.3 mg/dL (ref 8.4–10.5)
Chloride: 104 mEq/L (ref 96–112)
Creatinine, Ser: 0.71 mg/dL (ref 0.40–1.20)
GFR: 86.78 mL/min (ref >60.00)
Glucose, Bld: 100 mg/dL — ABNORMAL HIGH (ref 70–99)
Potassium: 4 mEq/L (ref 3.5–5.1)
Sodium: 140 mEq/L (ref 135–145)
Total Bilirubin: 0.4 mg/dL (ref 0.2–1.2)
Total Protein: 6.6 g/dL (ref 6.0–8.3)

## 2019-12-04 LAB — VITAMIN B12: Vitamin B-12: 447 pg/mL (ref 211–911)

## 2019-12-04 LAB — TSH: TSH: 1.56 u[IU]/mL (ref 0.35–4.50)

## 2019-12-06 LAB — VITAMIN D 1,25 DIHYDROXY
Vitamin D 1, 25 (OH)2 Total: 60 pg/mL (ref 18–72)
Vitamin D2 1, 25 (OH)2: <8 pg/mL
Vitamin D3 1, 25 (OH)2: 60 pg/mL

## 2019-12-06 LAB — VITAMIN B1: Vitamin B1 (Thiamine): 39 nmol/L — ABNORMAL HIGH (ref 8–30)

## 2019-12-27 ENCOUNTER — Ambulatory Visit (INDEPENDENT_AMBULATORY_CARE_PROVIDER_SITE_OTHER): Payer: 59 | Admitting: Obstetrics and Gynecology

## 2019-12-27 ENCOUNTER — Other Ambulatory Visit: Payer: Self-pay

## 2019-12-27 ENCOUNTER — Encounter: Payer: Self-pay | Admitting: Obstetrics and Gynecology

## 2019-12-27 VITALS — BP 118/76 | Ht 65.0 in | Wt 126.0 lb

## 2019-12-27 DIAGNOSIS — Z113 Encounter for screening for infections with a predominantly sexual mode of transmission: Secondary | ICD-10-CM

## 2019-12-27 DIAGNOSIS — Z79890 Hormone replacement therapy (postmenopausal): Secondary | ICD-10-CM

## 2019-12-27 DIAGNOSIS — Z01419 Encounter for gynecological examination (general) (routine) without abnormal findings: Secondary | ICD-10-CM | POA: Diagnosis not present

## 2019-12-27 DIAGNOSIS — E28319 Asymptomatic premature menopause: Secondary | ICD-10-CM

## 2019-12-27 MED ORDER — PROGESTERONE MICRONIZED 100 MG PO CAPS: 100.0000 mg | ORAL_CAPSULE | Freq: Every day | ORAL | 4 refills | Status: DC

## 2019-12-27 MED ORDER — ESTRADIOL 0.5 MG PO TABS: 1.0000 mg | ORAL_TABLET | Freq: Every day | ORAL | 4 refills | Status: DC

## 2019-12-27 NOTE — Patient Instructions (Signed)
Please remember to schedule your annual mammogram, and consider scheduling the screening colonoscopy that is recommended at age 49.

## 2019-12-27 NOTE — Progress Notes (Signed)
[name] 03-20-1971 [id number]  SUBJECTIVE:  49 y.o. [id number] female for annual routine gynecologic exam and Pap smear. She has no gynecologic concerns.  Current Outpatient Medications  Medication Sig Dispense Refill  . ALPRAZolam (XANAX) 0.25 MG tablet Take 1 tablet (0.25 mg total) by mouth 2 (two) times daily as needed for anxiety. 10 tablet 0  . amphetamine-dextroamphetamine (ADDERALL) 20 MG tablet Take 1 tablet (20 mg total) by mouth 2 (two) times daily. October 2020 60 tablet 0  . buPROPion (WELLBUTRIN XL) 150 MG 24 hr tablet TAKE 2 TABLETS (300 MG TOTAL) BY MOUTH DAILY. (Patient taking differently: Take 150 mg by mouth daily. ) 180 tablet 0  . Cholecalciferol (VITAMIN D3 SUPER STRENGTH) 50 MCG (2000 UT) TABS Take 1 tablet by mouth daily.    . citalopram (CELEXA) 10 MG tablet TAKE 1 TABLET BY MOUTH EVERY DAY 90 tablet 1  . cyclobenzaprine (FLEXERIL) 10 MG tablet TAKE 0.5-1 TABLETS (5-10 MG TOTAL) BY MOUTH AT BEDTIME AS NEEDED FOR MUSCLE SPASMS. 30 tablet 1  . estradiol (ESTRACE) 0.5 MG tablet Take 2 tablets (1 mg total) by mouth daily. 180 tablet 4  . fexofenadine (ALLEGRA) 180 MG tablet Take 180 mg by mouth daily.      . finasteride (PROSCAR) 5 MG tablet Take 1 tablet (5 mg total) by mouth daily. 90 tablet 1  . flunisolide (NASALIDE) 25 MCG/ACT (0.025%) SOLN PLEASE SPECIFY DIRECTIONS, REFILLS AND QUANTITY 75 mL 1  . hydrOXYzine (ATARAX/VISTARIL) 25 MG tablet Take 1 tablet by mouth at bedtime as needed for other. "for interstitial cystitis"    . progesterone (PROMETRIUM) 100 MG capsule Take 1 capsule (100 mg total) by mouth at bedtime. 90 capsule 4  . spironolactone (ALDACTONE) 100 MG tablet Take 100 mg by mouth daily.    Marland Kitchen tretinoin (RETIN-A) 0.025 % cream     . valACYclovir (VALTREX) 500 MG tablet Take 1 tablet (500 mg total) by mouth daily. 90 tablet 4   No current facility-administered medications for this visit.   Allergies: Patient has no known allergies.    Patient's last menstrual period was 07/03/2015.  Past medical history,surgical history, problem list, medications, allergies, family history and social history were all reviewed and documented as reviewed in the EPIC chart.  ROS:  Feeling well. No dyspnea or chest pain on exertion.  No abdominal pain, change in bowel habits, black or bloody stools.  No urinary tract symptoms. GYN ROS:  no abnormal bleeding, pelvic pain or discharge, no breast pain or new or enlarging lumps on self exam. No neurological complaints.    OBJECTIVE:  Ht 5\' 5"  (1.651 m)   Wt 126 lb (57.2 kg)   LMP 07/03/2015   BMI 20.97 kg/m  The patient appears well, alert, oriented x 3, in no distress. ENT normal.  Neck supple. No cervical or supraclavicular adenopathy or thyromegaly.  Lungs are clear, good air entry, no wheezes, rhonchi or rales. S1 and S2 normal, no murmurs, regular rate and rhythm.  Abdomen soft without tenderness, guarding, mass or organomegaly.  Neurological is normal, no focal findings.  BREAST EXAM: breasts appear normal, no suspicious masses, no skin or nipple changes or axillary nodes  PELVIC EXAM: VULVA: normal appearing vulva with no masses, tenderness or lesions, VAGINA: normal appearing vagina with normal color and discharge, no lesions, CERVIX: normal appearing cervix without discharge or lesions, UTERUS: uterus is normal size, shape, consistency and nontender, ADNEXA: normal adnexa in size, nontender and no masses  Chaperone: Maudie Mercury  Alcario Drought present during the examination  ASSESSMENT:  49 y.o. [id number] here for annual gynecologic exam  PLAN:   1.  Premature menopause on HRT.  He is taking oral estradiol 1 mg daily and Prometrium 100 mg nightly.  Denies any vaginal bleeding.  Briefly discussed risks of postmenopausal HRT which include thrombotic diseases, breast cancer, endometrial cancer.  Sounds like she did try to wean down on her dose but was experiencing hot flashes.  We will stay at the  same dose for now and we discussed that in the coming year as we want to consider trying to wean the dose as less hormone exposure theoretically equates to less risks. Refills x 1 year are provided. 2. Pap smear/HPV 12/2017.  No history of abnormal Pap smears.  Next Pap smear due 2024 following the current guidelines recommending the 5-year interval. 3. Mammogram 10/2018.  Normal breast exam today.  She is reminded to schedule an annual mammogram this year when able. 4. Colonoscopy reported a number of years ago.  Recommended that she start screening colonoscopy as she has turned age 38, the recommended time to start. 5. STD screening requested.  GC/chlamydia, HIV, RPR, hepatitis B and hepatitis C ordered. 6. History of HSV on Valtrex 500 mg daily.  Indicates no recent symptoms and not in need of refill at this point. 7. DEXA recommended plan further in the menopause.. 8. Health maintenance.  No labs today as she recently had these completed elsewhere.  Return annually or sooner, prn.  Joseph Pierini MD  12/27/19

## 2019-12-28 LAB — C. TRACHOMATIS/N. GONORRHOEAE RNA
C. trachomatis RNA, TMA: NOT DETECTED
N. gonorrhoeae RNA, TMA: NOT DETECTED

## 2019-12-28 LAB — HEPATITIS B SURFACE ANTIGEN: Hepatitis B Surface Ag: NONREACTIVE

## 2019-12-28 LAB — HEPATITIS C ANTIBODY
Hepatitis C Ab: NONREACTIVE
SIGNAL TO CUT-OFF: 0.01 (ref <1.00)

## 2019-12-28 LAB — HIV ANTIBODY (ROUTINE TESTING W REFLEX): HIV 1&2 Ab, 4th Generation: NONREACTIVE

## 2019-12-28 LAB — RPR: RPR Ser Ql: NONREACTIVE

## 2020-01-01 ENCOUNTER — Other Ambulatory Visit: Payer: Self-pay

## 2020-01-01 ENCOUNTER — Ambulatory Visit
Admission: RE | Admit: 2020-01-01 | Discharge: 2020-01-01 | Disposition: A | Discharge status: Home / Self Care | Payer: 59 | Source: Ambulatory Visit | Attending: Obstetrics and Gynecology | Admitting: Obstetrics and Gynecology

## 2020-01-01 DIAGNOSIS — Z1231 Encounter for screening mammogram for malignant neoplasm of breast: Secondary | ICD-10-CM

## 2020-01-07 ENCOUNTER — Other Ambulatory Visit: Payer: Self-pay | Admitting: Family Medicine

## 2020-01-25 ENCOUNTER — Ambulatory Visit: Payer: 59 | Admitting: Medical

## 2020-01-31 ENCOUNTER — Other Ambulatory Visit: Payer: Self-pay | Admitting: Family Medicine

## 2020-02-03 ENCOUNTER — Other Ambulatory Visit: Payer: Self-pay | Admitting: Family Medicine

## 2020-02-06 ENCOUNTER — Telehealth: Payer: Self-pay | Admitting: Family Medicine

## 2020-02-06 DIAGNOSIS — Z1211 Encounter for screening for malignant neoplasm of colon: Secondary | ICD-10-CM

## 2020-02-06 NOTE — Telephone Encounter (Signed)
Left detailed message on machine that referral was placed again.

## 2020-02-06 NOTE — Telephone Encounter (Signed)
  Caller : Caren Starzyk [phone]  Calling in regards referral to [facility]. Pt states that should have took place in the fall 2020 however patient  Didn't schedule appointment, patient has now changed insurance.

## 2020-02-13 ENCOUNTER — Other Ambulatory Visit: Payer: Self-pay

## 2020-02-14 ENCOUNTER — Encounter: Payer: Self-pay | Admitting: Obstetrics and Gynecology

## 2020-02-14 ENCOUNTER — Ambulatory Visit (INDEPENDENT_AMBULATORY_CARE_PROVIDER_SITE_OTHER): Payer: 59 | Admitting: Obstetrics and Gynecology

## 2020-02-14 VITALS — BP 118/76

## 2020-02-14 DIAGNOSIS — R590 Localized enlarged lymph nodes: Secondary | ICD-10-CM | POA: Diagnosis not present

## 2020-02-14 NOTE — Progress Notes (Signed)
   [name]  1971/06/15 [id number]  HPI The patient is a 49 y.o. [id number] who presents today for a breast lymph node concern on the left axilla.  She does have breast implants.  She has noticed since her last exam a bit of pain that runs down the lateral side of the left breast up into the axilla area where she can palpate a small lump.  She has felt this before.  The discomfort has slightly decreased more recently. She had a normal clinical breast exam and her routine visit about 2 months ago 12/27/2019, and she had a normal BI-RADS 1 mammogram 01/01/2020.  Sounds like she had this left axillary node checked out in the past in 2016 or so.  Past medical history,surgical history, problem list, medications, allergies, family history and social history were all reviewed and documented as reviewed in the EPIC chart.  Physical Exam  BP 118/76   LMP 07/03/2015   BREAST EXAM: The patient left breast and axilla is examined in sitting and laying position.  Sitting up, there is a palpable flat pea-sized lymph node present at the anterior aspect of the axillary area near the pectoralis insertion to the humerus.  Lymph node is mobile, mildly tender to deep palpation.  Left breast normal without mass, skin or nipple changes.  Physical Exam Chest:       Assessment 49 yo female with left axillary lymphadenopathy, HRT management  Plan I will have our staff help the patient arrange an ultrasound of the left axillary lymph node area.  Recent CBC showed a normal lymphocyte count on 12/03/2019.  I encouraged her to decrease her estradiol to 1 tablet (0.5 mg) daily if she can tolerate, continue the Prometrium 100 mg nightly.   Joseph Pierini MD, FACOG 02/14/20

## 2020-02-15 ENCOUNTER — Telehealth: Payer: Self-pay | Admitting: *Deleted

## 2020-02-15 DIAGNOSIS — R590 Localized enlarged lymph nodes: Secondary | ICD-10-CM

## 2020-02-15 NOTE — Telephone Encounter (Signed)
-----   Message from Joseph Pierini, MD sent at 02/14/2020 12:03 PM EDT ----- Regarding: Left axillary lymph node Please help the patient arrange for ultrasound imaging of the left axillary lymph node, thank you.

## 2020-02-15 NOTE — Telephone Encounter (Signed)
Orders placed at breast center, I called to confirm patient will need diag mammogram and left breast ultrasound and was told by breast center staff this was correct. They will call to schedule.

## 2020-02-18 NOTE — Telephone Encounter (Signed)
Patient scheduled at breast center on 02/26/20 @ 10:00am, left message for patient to call.

## 2020-02-19 ENCOUNTER — Ambulatory Visit (INDEPENDENT_AMBULATORY_CARE_PROVIDER_SITE_OTHER): Payer: 59 | Admitting: Orthopaedic Surgery

## 2020-02-19 ENCOUNTER — Other Ambulatory Visit: Payer: Self-pay

## 2020-02-19 ENCOUNTER — Encounter: Payer: Self-pay | Admitting: Orthopaedic Surgery

## 2020-02-19 ENCOUNTER — Ambulatory Visit (INDEPENDENT_AMBULATORY_CARE_PROVIDER_SITE_OTHER): Payer: 59

## 2020-02-19 DIAGNOSIS — M25572 Pain in left ankle and joints of left foot: Secondary | ICD-10-CM | POA: Diagnosis not present

## 2020-02-19 NOTE — Progress Notes (Signed)
Office Visit Note   Patient: [name]           Date of Birth: Sep 13, 1971           MRN: [id number] Visit Date: 02/19/2020              Requested by: Mosie Lukes, MD [address] [place],  [place] [address] PCP: Mosie Lukes, MD   Assessment & Plan: Visit Diagnoses:  1. Pain in left ankle and joints of left foot     Plan: I went over her ankle x-rays and exam in detail.  I would like her to try an ASO for the next several weeks.  Also want her to try Voltaren gel over the anterior talofibular ligament area and superficial peroneal nerve area and I told her where to apply this twice daily.  She will also try Aleve twice daily.  If she has not had much improvement 2 weeks from now she will call and let me know.  At that point I would likely try a 6-day steroid taper and some low-dose Neurontin.  All questions and concerns were answered and addressed.  Follow-Up Instructions: Return if symptoms worsen or fail to improve.   Orders:  Orders Placed This Encounter  Procedures  . XR Ankle Complete Left   No orders of the defined types were placed in this encounter.     Procedures: No procedures performed   Clinical Data: No additional findings.   Subjective: Chief Complaint  Patient presents with  . Left Ankle - Pain, Injury  Earnest Bailey is a close personal friend of mine who is an absolutely wonderful person.  She unfortunately injured her left ankle when she actually tripped down some stairs last month.  She failed and heard an audible pop with her left ankle.  She stayed off it and iced and elevated the ankle and then the next day went to urgent care.  Per her report x-rays there were negative.  She is still dealing with anterior lateral ankle pain with decreased range of motion and strength with her left ankle.  She comes to me for an opinion as it relates to her left ankle.  She is never injured this ankle before.  She does wear supportive shoes  and has limited some of her activities due to her ankle pain.  HPI  Review of Systems There is currently no active medical issues including no headache, chest pain, shortness of breath, fever, chills, nausea, vomiting  Objective: Vital Signs: LMP 07/03/2015   Physical Exam She is alert and orient x3 and in no acute distress Ortho Exam Examination of her left ankle shows no swelling or bruising.  Her left ankle is ligamentously stable.  She has significant amount of pain over the anterior talofibular ligament and is very sensitive with a positive Tinel's sign over the superficial peroneal nerve.  I do feel some popping of the peroneal tendons on the lateral aspect of her ankle but there is no subluxating of these tendons. Specialty Comments:  No specialty comments available.  Imaging: XR Ankle Complete Left  Result Date: 02/19/2020 3 views of the left ankle show no acute findings.    PMFS History: Patient Active Problem List   Diagnosis Date Noted  . Alopecia 06/27/2019  . Hyperlipidemia 12/27/2018  . Rash 02/18/2018  . Neck pain on right side 12/02/2014  . Other malaise and fatigue 01/27/2014  . Perimenopausal 07/22/2013  . Shoulder pain, left  04/11/2012  . Otitis media 03/16/2012  . Palpitations 02/14/2012  . UTI (lower urinary tract infection) 08/28/2011  . Anxiety and depression 07/31/2011  . Vitamin D deficiency 07/02/2011  . Multiple allergies 07/02/2011  . Interstitial cystitis   . IBS (irritable bowel syndrome)   . GERD (gastroesophageal reflux disease)   . Premature atrial contractions   . Adult ADHD    Past Medical History:  Diagnosis Date  . Alopecia   . Anxiety and depression 07/31/2011  . Chest pain 09/22/10   Sharp pains in left breast & sometimes in the right breast as well. Random in occurence & nonexertional. Also has occasional SOB & DOE when going up stairs & occasionally when working out. Nuclear Stress Treadmill 09/29/10 -   . HSV-2 (herpes  simplex virus 2) infection   . IBS (irritable bowel syndrome)   . Interstitial cystitis   . Multiple allergies 07/02/2011  . Otitis media 03/16/2012  . Perimenopausal 07/22/2013  . PONV (postoperative nausea and vomiting)   . Poor concentration 07/02/2011  . Shoulder pain, left 04/11/2012  . SOB (shortness of breath) 09/22/10   ECHO 09/29/10 - NL LVF, trivial PE, trivial TR/PR.  Marland Kitchen UTI (lower urinary tract infection) 08/28/2011    Family History  Problem Relation Age of Onset  . Heart disease Mother        MVP  . Anxiety disorder Mother   . Mitral valve prolapse Mother   . Alzheimer's disease Mother   . Hypertension Brother   . Obesity Brother   . Diabetes Brother        DM, obese  . Allergies Daughter   . Allergies Son   . Kidney disease Maternal Grandmother        uremia  . Breast cancer Neg Hx     Past Surgical History:  Procedure Laterality Date  . BREAST BIOPSY Right 03/12/2016   Procedure: breast exploration;  Surgeon: Jackolyn Confer, MD;  Location: [facility];  Service: General;  Laterality: Right;  . BREAST ENHANCEMENT SURGERY  2007  . CESAREAN SECTION  2001 and 2004   x2  . LAPAROSCOPY     twice  . RHINOPLASTY  2003  . TONSILLECTOMY  1982  . TUBAL LIGATION     Social History   Occupational History  . Not on file  Tobacco Use  . Smoking status: Never Smoker  . Smokeless tobacco: Never Used  Substance and Sexual Activity  . Alcohol use: Yes    Alcohol/week: 0.0 standard drinks    Comment: RARELY  . Drug use: No  . Sexual activity: Not Currently    Birth control/protection: Surgical    Comment: Tubal lig-1st intercourse 49 yo-More than 5 partners

## 2020-02-20 ENCOUNTER — Other Ambulatory Visit: Payer: Self-pay

## 2020-02-20 MED ORDER — VALACYCLOVIR HCL 500 MG PO TABS: 500.0000 mg | ORAL_TABLET | Freq: Every day | ORAL | 4 refills | Status: DC

## 2020-02-21 NOTE — Telephone Encounter (Signed)
Patient informed with time and date.

## 2020-02-26 ENCOUNTER — Ambulatory Visit
Admission: RE | Admit: 2020-02-26 | Discharge: 2020-02-26 | Disposition: A | Discharge status: Home / Self Care | Payer: 59 | Source: Ambulatory Visit | Attending: Obstetrics and Gynecology | Admitting: Obstetrics and Gynecology

## 2020-02-26 ENCOUNTER — Other Ambulatory Visit: Payer: Self-pay

## 2020-02-26 DIAGNOSIS — R590 Localized enlarged lymph nodes: Secondary | ICD-10-CM

## 2020-02-29 ENCOUNTER — Other Ambulatory Visit: Payer: Self-pay

## 2020-02-29 ENCOUNTER — Telehealth (INDEPENDENT_AMBULATORY_CARE_PROVIDER_SITE_OTHER): Payer: 59 | Admitting: Family Medicine

## 2020-02-29 ENCOUNTER — Encounter: Payer: Self-pay | Admitting: Family Medicine

## 2020-02-29 DIAGNOSIS — F909 Attention-deficit hyperactivity disorder, unspecified type: Secondary | ICD-10-CM | POA: Diagnosis not present

## 2020-02-29 DIAGNOSIS — R5383 Other fatigue: Secondary | ICD-10-CM | POA: Diagnosis not present

## 2020-02-29 DIAGNOSIS — R110 Nausea: Secondary | ICD-10-CM

## 2020-02-29 DIAGNOSIS — E559 Vitamin D deficiency, unspecified: Secondary | ICD-10-CM

## 2020-03-02 DIAGNOSIS — R110 Nausea: Secondary | ICD-10-CM | POA: Insufficient documentation

## 2020-03-02 NOTE — Assessment & Plan Note (Signed)
No vomiting but she has frequent episodes of nausea recently, no anorexia or pain noted. Check labs and avoid spicy and fatty foods.

## 2020-03-02 NOTE — Progress Notes (Signed)
Virtual Visit via Video Note  I connected with [name] on 02/29/20 at  9:40 AM EDT by a video enabled telemedicine application and verified that I am speaking with the correct person using two identifiers.  Location: Patient: home Provider: office   I discussed the limitations of evaluation and management by telemedicine and the availability of in person appointments. The patient expressed understanding and agreed to proceed. Kem Boroughs, CMA was able to get the patient setup on a video visit   Subjective:    Patient ID: [name], female    DOB: December 19, 1970, 49 y.o.   MRN: [id number]  Chief Complaint  Patient presents with  . Fatigue  . upset stomach    HPI Patient is in today for evaluation of ongoing fatigue and nausea. She has been noting persistent but worsened fatigue despite getting 8 hours of sleep. She has had both of her [place] shots. She does note that she is not hydrating well many days. She tries to make a heart healthy high protein diet. She notes nausea but denies vomiting, anorexia or abdominal pain. Eating seems to help some. Denies CP/palp/SOB/HA/congestion/fevers or GU c/o. Taking meds as prescribed. She does acknowledge heightened anxiety during the pandemic.   Past Medical History:  Diagnosis Date  . Alopecia   . Anxiety and depression 07/31/2011  . Chest pain 09/22/10   Sharp pains in left breast & sometimes in the right breast as well. Random in occurence & nonexertional. Also has occasional SOB & DOE when going up stairs & occasionally when working out. Nuclear Stress Treadmill 09/29/10 -   . HSV-2 (herpes simplex virus 2) infection   . IBS (irritable bowel syndrome)   . Interstitial cystitis   . Multiple allergies 07/02/2011  . Otitis media 03/16/2012  . Perimenopausal 07/22/2013  . PONV (postoperative nausea and vomiting)   . Poor concentration 07/02/2011  . Shoulder pain, left 04/11/2012  . SOB (shortness of breath)  09/22/10   ECHO 09/29/10 - NL LVF, trivial PE, trivial TR/PR.  Marland Kitchen UTI (lower urinary tract infection) 08/28/2011    Past Surgical History:  Procedure Laterality Date  . BREAST BIOPSY Right 03/12/2016   Procedure: breast exploration;  Surgeon: Jackolyn Confer, MD;  Location: [facility];  Service: General;  Laterality: Right;  . BREAST ENHANCEMENT SURGERY  2007  . CESAREAN SECTION  2001 and 2004   x2  . LAPAROSCOPY     twice  . RHINOPLASTY  2003  . TONSILLECTOMY  1982  . TUBAL LIGATION      Family History  Problem Relation Age of Onset  . Heart disease Mother        MVP  . Anxiety disorder Mother   . Mitral valve prolapse Mother   . Alzheimer's disease Mother   . Hypertension Brother   . Obesity Brother   . Diabetes Brother        DM, obese  . Allergies Daughter   . Allergies Son   . Kidney disease Maternal Grandmother        uremia  . Breast cancer Neg Hx     Social History   Socioeconomic History  . Marital status: Divorced    Spouse name: Not on file  . Number of children: Not on file  . Years of education: Not on file  . Highest education level: Not on file  Occupational History  . Not on file  Tobacco Use  . Smoking status: Never Smoker  . Smokeless  tobacco: Never Used  Substance and Sexual Activity  . Alcohol use: Yes    Alcohol/week: 0.0 standard drinks    Comment: RARELY  . Drug use: No  . Sexual activity: Not Currently    Birth control/protection: Surgical    Comment: Tubal lig-1st intercourse 49 yo-More than 5 partners  Other Topics Concern  . Not on file  Social History Narrative  . Not on file   Social Determinants of Health   Financial Resource Strain:   . Difficulty of Paying Living Expenses:   Food Insecurity:   . Worried About Charity fundraiser in the Last Year:   . Arboriculturist in the Last Year:   Transportation Needs:   . Film/video editor (Medical):   Marland Kitchen Lack of Transportation (Non-Medical):   Physical  Activity:   . Days of Exercise per Week:   . Minutes of Exercise per Session:   Stress:   . Feeling of Stress :   Social Connections:   . Frequency of Communication with Friends and Family:   . Frequency of Social Gatherings with Friends and Family:   . Attends Religious Services:   . Active Member of Clubs or Organizations:   . Attends Archivist Meetings:   Marland Kitchen Marital Status:   Intimate Partner Violence:   . Fear of Current or Ex-Partner:   . Emotionally Abused:   Marland Kitchen Physically Abused:   . Sexually Abused:     Outpatient Medications Prior to Visit  Medication Sig Dispense Refill  . ALPRAZolam (XANAX) 0.25 MG tablet Take 1 tablet (0.25 mg total) by mouth 2 (two) times daily as needed for anxiety. 10 tablet 0  . amphetamine-dextroamphetamine (ADDERALL) 20 MG tablet Take 1 tablet (20 mg total) by mouth 2 (two) times daily. October 2020 60 tablet 0  . buPROPion (WELLBUTRIN XL) 150 MG 24 hr tablet TAKE 2 TABLETS (300 MG TOTAL) BY MOUTH DAILY. 180 tablet 0  . Cholecalciferol (VITAMIN D3 SUPER STRENGTH) 50 MCG (2000 UT) TABS Take 1 tablet by mouth daily.    . citalopram (CELEXA) 10 MG tablet TAKE 1 TABLET BY MOUTH EVERY DAY 90 tablet 1  . cyclobenzaprine (FLEXERIL) 10 MG tablet TAKE 0.5-1 TABLETS (5-10 MG TOTAL) BY MOUTH AT BEDTIME AS NEEDED FOR MUSCLE SPASMS. 30 tablet 1  . estradiol (ESTRACE) 0.5 MG tablet Take 2 tablets (1 mg total) by mouth daily. (Patient taking differently: Take 0.5 mg by mouth daily. ) 180 tablet 4  . fexofenadine (ALLEGRA) 180 MG tablet Take 180 mg by mouth daily.      . finasteride (PROSCAR) 5 MG tablet Take 1 tablet (5 mg total) by mouth daily. NEEDS OV 90 tablet 0  . flunisolide (NASALIDE) 25 MCG/ACT (0.025%) SOLN PLEASE SPECIFY DIRECTIONS, REFILLS AND QUANTITY 75 mL 1  . hydrOXYzine (ATARAX/VISTARIL) 25 MG tablet Take 1 tablet by mouth at bedtime as needed for other. "for interstitial cystitis"    . nitrofurantoin, macrocrystal-monohydrate, (MACROBID)  100 MG capsule Take 100 mg by mouth as needed.    . progesterone (PROMETRIUM) 100 MG capsule Take 1 capsule (100 mg total) by mouth at bedtime. 90 capsule 4  . spironolactone (ALDACTONE) 100 MG tablet Take 100 mg by mouth daily.    Marland Kitchen tretinoin (RETIN-A) 0.025 % cream     . valACYclovir (VALTREX) 500 MG tablet Take 1 tablet (500 mg total) by mouth daily. 90 tablet 4   No facility-administered medications prior to visit.    No Known Allergies  Review of Systems  Constitutional: Positive for malaise/fatigue. Negative for fever.  HENT: Negative for congestion.   Eyes: Negative for blurred vision.  Respiratory: Negative for shortness of breath.   Cardiovascular: Negative for chest pain, palpitations and leg swelling.  Gastrointestinal: Negative for abdominal pain, blood in stool and nausea.  Genitourinary: Negative for dysuria and frequency.  Musculoskeletal: Negative for falls.  Skin: Negative for rash.  Neurological: Negative for dizziness, loss of consciousness and headaches.  Endo/Heme/Allergies: Negative for environmental allergies.  Psychiatric/Behavioral: Negative for depression. The patient is nervous/anxious.        Objective:    Physical Exam Constitutional:      Appearance: Normal appearance. She is not ill-appearing.  HENT:     Head: Normocephalic and atraumatic.     Right Ear: External ear normal.     Left Ear: External ear normal.     Nose: Nose normal.  Eyes:     General:        Right eye: No discharge.        Left eye: No discharge.  Pulmonary:     Effort: Pulmonary effort is normal.  Neurological:     Mental Status: She is alert and oriented to person, place, and time.  Psychiatric:        Behavior: Behavior normal.     BP 100/72 (BP Location: Left Arm, Patient Position: Sitting, Cuff Size: Normal)   Wt 125 lb (56.7 kg)   LMP 07/03/2015   BMI 20.80 kg/m  Wt Readings from Last 3 Encounters:  02/29/20 125 lb (56.7 kg)  12/27/19 126 lb (57.2 kg)    11/30/19 122 lb (55.3 kg)    Diabetic Foot Exam - Simple   No data filed     Lab Results  Component Value Date   WBC 4.2 12/03/2019   HGB 12.6 12/03/2019   HCT 37.9 12/03/2019   PLT 190.0 12/03/2019   GLUCOSE 100 (H) 12/03/2019   CHOL 181 07/26/2019   TRIG 64.0 07/26/2019   HDL 53.10 07/26/2019   LDLCALC 116 (H) 07/26/2019   ALT 15 12/03/2019   AST 15 12/03/2019   NA 140 12/03/2019   K 4.0 12/03/2019   CL 104 12/03/2019   CREATININE 0.71 12/03/2019   BUN 21 12/03/2019   CO2 32 12/03/2019   TSH 1.56 12/03/2019    Lab Results  Component Value Date   TSH 1.56 12/03/2019   Lab Results  Component Value Date   WBC 4.2 12/03/2019   HGB 12.6 12/03/2019   HCT 37.9 12/03/2019   MCV 88.7 12/03/2019   PLT 190.0 12/03/2019   Lab Results  Component Value Date   NA 140 12/03/2019   K 4.0 12/03/2019   CO2 32 12/03/2019   GLUCOSE 100 (H) 12/03/2019   BUN 21 12/03/2019   CREATININE 0.71 12/03/2019   BILITOT 0.4 12/03/2019   ALKPHOS 35 (L) 12/03/2019   AST 15 12/03/2019   ALT 15 12/03/2019   PROT 6.6 12/03/2019   ALBUMIN 4.3 12/03/2019   CALCIUM 9.3 12/03/2019   GFR 86.78 12/03/2019   Lab Results  Component Value Date   CHOL 181 07/26/2019   Lab Results  Component Value Date   HDL 53.10 07/26/2019   Lab Results  Component Value Date   LDLCALC 116 (H) 07/26/2019   Lab Results  Component Value Date   TRIG 64.0 07/26/2019   Lab Results  Component Value Date   CHOLHDL 3 07/26/2019   No results found for: HGBA1C  Assessment & Plan:   Problem List Items Addressed This Visit    Adult ADHD    Doing well on current meds. No changes.       Vitamin D deficiency    Supplement and monitor      Fatigue    Multifactorial likely related to the stress of the pandemic, check labs. Encouraged increased rest, exercise, quality diet and increase hydration. She acknowledges she does not hydrate well most days. Report if no improvement      Nausea    No  vomiting but she has frequent episodes of nausea recently, no anorexia or pain noted. Check labs and avoid spicy and fatty foods.          I am having Chantae J. Moshier "Blanca" maintain her fexofenadine, tretinoin, hydrOXYzine, Cholecalciferol, spironolactone, amphetamine-dextroamphetamine, ALPRAZolam, flunisolide, citalopram, nitrofurantoin (macrocrystal-monohydrate), estradiol, progesterone, cyclobenzaprine, buPROPion, finasteride, and valACYclovir.  No orders of the defined types were placed in this encounter.    I discussed the assessment and treatment plan with the patient. The patient was provided an opportunity to ask questions and all were answered. The patient agreed with the plan and demonstrated an understanding of the instructions.   The patient was advised to call back or seek an in-person evaluation if the symptoms worsen or if the condition fails to improve as anticipated.  I provided 20 minutes of non-face-to-face time during this encounter.   Penni Homans, MD

## 2020-03-02 NOTE — Assessment & Plan Note (Signed)
Supplement and monitor 

## 2020-03-02 NOTE — Assessment & Plan Note (Addendum)
Multifactorial likely related to the stress of the pandemic, check labs. Encouraged increased rest, exercise, quality diet and increase hydration. She acknowledges she does not hydrate well most days. Report if no improvement

## 2020-03-02 NOTE — Assessment & Plan Note (Signed)
Doing well on current meds. No changes  

## 2020-03-04 ENCOUNTER — Ambulatory Visit: Payer: 59 | Admitting: Podiatry

## 2020-03-07 ENCOUNTER — Other Ambulatory Visit: Payer: Self-pay | Admitting: Family Medicine

## 2020-03-11 ENCOUNTER — Encounter: Payer: Self-pay | Admitting: Gastroenterology

## 2020-03-12 ENCOUNTER — Other Ambulatory Visit: Payer: Self-pay

## 2020-03-12 DIAGNOSIS — S82899A Other fracture of unspecified lower leg, initial encounter for closed fracture: Secondary | ICD-10-CM

## 2020-03-21 ENCOUNTER — Other Ambulatory Visit: Payer: Self-pay

## 2020-03-21 ENCOUNTER — Ambulatory Visit (AMBULATORY_SURGERY_CENTER): Payer: 59 | Admitting: *Deleted

## 2020-03-21 VITALS — Ht 65.0 in | Wt 125.0 lb

## 2020-03-21 DIAGNOSIS — Z1211 Encounter for screening for malignant neoplasm of colon: Secondary | ICD-10-CM

## 2020-03-21 MED ORDER — SUPREP BOWEL PREP KIT 17.5-3.13-1.6 GM/177ML PO SOLN: 1.0000 | Freq: Once | ORAL | 0 refills | Status: AC

## 2020-03-21 NOTE — Progress Notes (Signed)
No egg or soy allergy known to patient  issues with past sedation with any surgeries  or procedures- PONV , no intubation problems  No diet pills per patient No home 02 use per patient  No blood thinners per patient  Pt denies issues with constipation  No A fib or A flutter  EMMI video sent to pt's e mail   03-02-20 comp covid vaccines   Due to the COVID-19 pandemic we are asking patients to follow these guidelines. Please only bring one care partner. Please be aware that your care partner may wait in the car in the parking lot or if they feel like they will be too hot to wait in the car, they may wait in the lobby on the 4th floor. All care partners are required to wear a mask the entire time (we do not have any that we can provide them), they need to practice social distancing, and we will do a Covid check for all patient's and care partners when you arrive. Also we will check their temperature and your temperature. If the care partner waits in their car they need to stay in the parking lot the entire time and we will call them on their cell phone when the patient is ready for discharge so they can bring the car to the front of the building. Also all patient's will need to wear a mask into building.

## 2020-04-04 ENCOUNTER — Encounter: Payer: Self-pay | Admitting: Gastroenterology

## 2020-04-04 ENCOUNTER — Ambulatory Visit (AMBULATORY_SURGERY_CENTER): Payer: 59 | Admitting: Gastroenterology

## 2020-04-04 ENCOUNTER — Other Ambulatory Visit: Payer: Self-pay

## 2020-04-04 VITALS — BP 98/48 | HR 64 | Temp 97.5°F | Resp 13 | Ht 65.0 in | Wt 125.0 lb

## 2020-04-04 DIAGNOSIS — Z1211 Encounter for screening for malignant neoplasm of colon: Secondary | ICD-10-CM

## 2020-04-04 DIAGNOSIS — D123 Benign neoplasm of transverse colon: Secondary | ICD-10-CM

## 2020-04-04 MED ORDER — SODIUM CHLORIDE 0.9 % IV SOLN: 500.0000 mL | Freq: Once | INTRAVENOUS | Status: DC

## 2020-04-04 NOTE — Progress Notes (Signed)
Pt's states no medical or surgical changes since previsit or office visit.   VS taken by CW 

## 2020-04-04 NOTE — Op Note (Signed)
[place] [facility] Patient Name: [name] Procedure Date: 04/04/2020 8:29 AM MRN: [id number] Endoscopist: [name] , MD Age: 49 Referring MD:  Date of Birth: 08/08/1971 Gender: Female Account #: [id number] Procedure:                Colonoscopy Indications:              Screening for colorectal malignant neoplasm, This                            is the patient's first screening colonoscopy Medicines:                Monitored Anesthesia Care Procedure:                Pre-Anesthesia Assessment:                           - Prior to the procedure, a History and Physical                            was performed, and patient medications and                            allergies were reviewed. The patient's tolerance of                            previous anesthesia was also reviewed. The risks                            and benefits of the procedure and the sedation                            options and risks were discussed with the patient.                            All questions were answered, and informed consent                            was obtained. Prior Anticoagulants: The patient has                            taken no previous anticoagulant or antiplatelet                            agents. ASA Grade Assessment: II - A patient with                            mild systemic disease. After reviewing the risks                            and benefits, the patient was deemed in                            satisfactory condition to undergo the procedure.  After obtaining informed consent, the colonoscope                            was passed under direct vision. Throughout the                            procedure, the patient's blood pressure, pulse, and                            oxygen saturations were monitored continuously. The                            Colonoscope was introduced through the anus and                            advanced to the  the cecum, identified by                            appendiceal orifice and ileocecal valve. The                            colonoscopy was performed without difficulty. The                            patient tolerated the procedure well. The quality                            of the bowel preparation was good. The ileocecal                            valve, appendiceal orifice, and rectum were                            photographed. The bowel preparation used was                            Miralax. Scope In: 8:40:23 AM Scope Out: 8:53:04 AM Scope Withdrawal Time: 0 hours 9 minutes 54 seconds  Total Procedure Duration: 0 hours 12 minutes 41 seconds  Findings:                 The perianal and digital rectal examinations were                            normal.                           A diminutive polyp was found in the proximal                            transverse colon. The polyp was sessile. The polyp                            was removed with a cold biopsy forceps. Resection  and retrieval were complete.                           The exam was otherwise without abnormality on                            direct and retroflexion views. Complications:            No immediate complications. Estimated Blood Loss:     Estimated blood loss was minimal. Impression:               - One diminutive polyp in the proximal transverse                            colon, removed with a cold biopsy forceps. Resected                            and retrieved.                           - The examination was otherwise normal on direct                            and retroflexion views. Recommendation:           - Patient has a contact number available for                            emergencies. The signs and symptoms of potential                            delayed complications were discussed with the                            patient. Return to normal activities tomorrow.                             Written discharge instructions were provided to the                            patient.                           - Resume previous diet.                           - Continue present medications.                           - Await pathology results.                           - Repeat colonoscopy is recommended for                            surveillance. The colonoscopy date will be  determined after pathology results from today's                            exam become available for review. Katherine Yadao L. Loletha Carrow, MD 04/04/2020 8:55:56 AM This report has been signed electronically.

## 2020-04-04 NOTE — Progress Notes (Signed)
Called to room to assist during endoscopic procedure.  Patient ID and intended procedure confirmed with present staff. Received instructions for my participation in the procedure from the performing physician.  

## 2020-04-04 NOTE — Patient Instructions (Signed)
Handouts provided:  Polyps  YOU HAD AN ENDOSCOPIC PROCEDURE TODAY AT THE [facility]:   Refer to the procedure report that was given to you for any specific questions about what was found during the examination.  If the procedure report does not answer your questions, please call your gastroenterologist to clarify.  If you requested that your care partner not be given the details of your procedure findings, then the procedure report has been included in a sealed envelope for you to review at your convenience later.  YOU SHOULD EXPECT: Some feelings of bloating in the abdomen. Passage of more gas than usual.  Walking can help get rid of the air that was put into your GI tract during the procedure and reduce the bloating. If you had a lower endoscopy (such as a colonoscopy or flexible sigmoidoscopy) you may notice spotting of blood in your stool or on the toilet paper. If you underwent a bowel prep for your procedure, you may not have a normal bowel movement for a few days.  Please Note:  You might notice some irritation and congestion in your nose or some drainage.  This is from the oxygen used during your procedure.  There is no need for concern and it should clear up in a day or so.  SYMPTOMS TO REPORT IMMEDIATELY:   Following lower endoscopy (colonoscopy or flexible sigmoidoscopy):  Excessive amounts of blood in the stool  Significant tenderness or worsening of abdominal pains  Swelling of the abdomen that is new, acute  Fever of 100F or higher  For urgent or emergent issues, a gastroenterologist can be reached at any hour by calling [phone]. Do not use MyChart messaging for urgent concerns.    DIET:  We do recommend a small meal at first, but then you may proceed to your regular diet.  Drink plenty of fluids but you should avoid alcoholic beverages for 24 hours.  ACTIVITY:  You should plan to take it easy for the rest of today and you should NOT DRIVE or use heavy  machinery until tomorrow (because of the sedation medicines used during the test).    FOLLOW UP: Our staff will call the number listed on your records 48-72 hours following your procedure to check on you and address any questions or concerns that you may have regarding the information given to you following your procedure. If we do not reach you, we will leave a message.  We will attempt to reach you two times.  During this call, we will ask if you have developed any symptoms of COVID 19. If you develop any symptoms (ie: fever, flu-like symptoms, shortness of breath, cough etc.) before then, please call [phone].  If you test positive for Covid 19 in the 2 weeks post procedure, please call and report this information to us.    If any biopsies were taken you will be contacted by phone or by letter within the next 1-3 weeks.  Please call us at [phone] if you have not heard about the biopsies in 3 weeks.    SIGNATURES/CONFIDENTIALITY: You and/or your care partner have signed paperwork which will be entered into your electronic medical record.  These signatures attest to the fact that that the information above on your After Visit Summary has been reviewed and is understood.  Full responsibility of the confidentiality of this discharge information lies with you and/or your care-partner.  

## 2020-04-04 NOTE — Progress Notes (Signed)
To PACU, VSS. Report to [email or web address] 

## 2020-04-08 ENCOUNTER — Telehealth: Payer: Self-pay

## 2020-04-08 NOTE — Telephone Encounter (Signed)
  Follow up Call-  Call back number 04/04/2020  Post procedure Call Back phone  # [phone]  Permission to leave phone message Yes  Some recent data might be hidden     Patient questions:  Do you have a fever, pain , or abdominal swelling? No. Pain Score  0 *  Have you tolerated food without any problems? Yes.    Have you been able to return to your normal activities? Yes.    Do you have any questions about your discharge instructions: Diet   No. Medications  No. Follow up visit  No.  Do you have questions or concerns about your Care? No.  Actions: * If pain score is 4 or above: No action needed, pain <4.  1. Have you developed a fever since your procedure? no  2.   Have you had an respiratory symptoms (SOB or cough) since your procedure? no  3.   Have you tested positive for COVID 19 since your procedure no  4.   Have you had any family members/close contacts diagnosed with the COVID 19 since your procedure?  no   If yes to any of these questions please route to Joylene John, RN and Erenest Rasher, RN

## 2020-04-08 NOTE — Telephone Encounter (Signed)
First attempt follow up call to pt, lm on vm 

## 2020-04-09 ENCOUNTER — Encounter: Payer: Self-pay | Admitting: Gastroenterology

## 2020-04-09 ENCOUNTER — Encounter: Payer: 59 | Admitting: Gastroenterology

## 2020-04-12 ENCOUNTER — Other Ambulatory Visit: Payer: Self-pay

## 2020-04-12 ENCOUNTER — Ambulatory Visit
Admission: RE | Admit: 2020-04-12 | Discharge: 2020-04-12 | Disposition: A | Discharge status: Home / Self Care | Payer: 59 | Source: Ambulatory Visit | Attending: Orthopaedic Surgery | Admitting: Orthopaedic Surgery

## 2020-04-12 DIAGNOSIS — S82899A Other fracture of unspecified lower leg, initial encounter for closed fracture: Secondary | ICD-10-CM

## 2020-04-23 ENCOUNTER — Other Ambulatory Visit: Payer: Self-pay

## 2020-04-23 ENCOUNTER — Encounter: Payer: Self-pay | Admitting: Orthopaedic Surgery

## 2020-04-23 ENCOUNTER — Ambulatory Visit (INDEPENDENT_AMBULATORY_CARE_PROVIDER_SITE_OTHER): Payer: 59 | Admitting: Orthopaedic Surgery

## 2020-04-23 DIAGNOSIS — IMO0001 Reserved for inherently not codable concepts without codable children: Secondary | ICD-10-CM

## 2020-04-23 DIAGNOSIS — S93402D Sprain of unspecified ligament of left ankle, subsequent encounter: Secondary | ICD-10-CM

## 2020-04-23 NOTE — Progress Notes (Signed)
Earnest Bailey comes in today to go over an MRI of her left ankle.  She injured this ankle back in March when she twisted quite a bit and thought it significant pop.  She also heard a pop.  She does feel slightly better from when I saw her last but is still anterior lateral ankle pain that she is having.  It bothers her most on uneven surfaces.  She is avoiding high impact aerobic activities as well.  On examination of her left ankle there is definitely tenderness and pain over the anterior talofibular ligament area of the ankle.  There are some tingling in this area as well.  Her ankle is ligamentously stable with full range of motion.  The peroneal tendons are intact and do not sublux.  The medial side of her ankle is asymptomatic.  She has a normal arch and normal contours of the foot.  Her Achilles is intact.  Her foot is well-perfused.  The MRI does show severe scarring and strain of the anterior talofibular ligament on the left ankle suggesting a significant sprain.  There is no disruption of the ligament.  The other structures of the ankle and foot are intact.  She understands that she does have a severe ankle sprain and this will take time for this to heal.  I am recommending outpatient physical therapy given her high level of activity and function.  We will work on getting this scheduled.  If her symptoms do not improve with time she will let me know.  All questions and concerns were answered and addressed.

## 2020-04-25 ENCOUNTER — Other Ambulatory Visit: Payer: Self-pay | Admitting: Family Medicine

## 2020-04-29 ENCOUNTER — Other Ambulatory Visit: Payer: Self-pay | Admitting: Family Medicine

## 2020-05-06 ENCOUNTER — Other Ambulatory Visit: Payer: Self-pay

## 2020-05-06 ENCOUNTER — Encounter: Payer: Self-pay | Admitting: Rehabilitative and Restorative Service Providers"

## 2020-05-06 ENCOUNTER — Ambulatory Visit: Payer: 59 | Admitting: Rehabilitative and Restorative Service Providers"

## 2020-05-06 DIAGNOSIS — M6281 Muscle weakness (generalized): Secondary | ICD-10-CM

## 2020-05-06 DIAGNOSIS — M25572 Pain in left ankle and joints of left foot: Secondary | ICD-10-CM

## 2020-05-06 DIAGNOSIS — R262 Difficulty in walking, not elsewhere classified: Secondary | ICD-10-CM

## 2020-05-06 NOTE — Patient Instructions (Signed)
Access Code: [email or web address] URL: [email or web address] Date: 05/06/2020 Prepared by: Scot Jun  Exercises Long Sitting Ankle Eversion with Resistance - 2 x daily - 7 x weekly - 10 reps - 3 sets Long Sitting Ankle Plantar Flexion with Resistance - 2 x daily - 7 x weekly - 10 reps - 3 sets Long Sitting Ankle Inversion with Resistance - 2 x daily - 7 x weekly - 10 reps - 3 sets Long Sitting Ankle Dorsiflexion with Anchored Resistance - 2 x daily - 7 x weekly - 10 reps - 3 sets Single Leg Stance - 2 x daily - 7 x weekly - 1 sets - 5 reps - 30 hold

## 2020-05-06 NOTE — Therapy (Signed)
[facility] Physical Therapy [address] [place], [place], [address] Phone: [phone]   Fax:  [phone]  Physical Therapy Evaluation  Patient Details  Name: Gicela Schwarting MRN: [id number] Date of Birth: 08/22/1971 Referring Provider (PT): Dr. Ninfa Linden   Encounter Date: 05/06/2020   PT End of Session - 05/06/20 0920    Visit Number 1    Number of Visits 6    Date for PT Re-Evaluation 06/17/20    PT Start Time 0850    PT Stop Time 0920    PT Time Calculation (min) 30 min    Activity Tolerance Patient tolerated treatment well    Behavior During Therapy [facility] for tasks assessed/performed           Past Medical History:  Diagnosis Date   Allergy    Alopecia    Anxiety and depression 07/31/2011   Chest pain 09/22/10   Sharp pains in left breast & sometimes in the right breast as well. Random in occurence & nonexertional. Also has occasional SOB & DOE when going up stairs & occasionally when working out. Nuclear Stress Treadmill 09/29/10 -    HSV-2 (herpes simplex virus 2) infection    IBS (irritable bowel syndrome)    Interstitial cystitis    Multiple allergies 07/02/2011   Neuromuscular disorder ([place])    past hx dx of Fibromyalgia   Otitis media 03/16/2012   Perimenopausal 07/22/2013   PONV (postoperative nausea and vomiting)    Poor concentration 07/02/2011   Shoulder pain, left 04/11/2012   SOB (shortness of breath) 09/22/10   ECHO 09/29/10 - NL LVF, trivial PE, trivial TR/PR.   UTI (lower urinary tract infection) 08/28/2011    Past Surgical History:  Procedure Laterality Date   BREAST BIOPSY Right 03/12/2016   Procedure: breast exploration;  Surgeon: Jackolyn Confer, MD;  Location: [place];  Service: General;  Laterality: Right;   BREAST ENHANCEMENT SURGERY  2007   CESAREAN SECTION  2001 and 2004   x2   COLONOSCOPY  2007   LAPAROSCOPY     twice   RHINOPLASTY  2003   [place]    TUBAL LIGATION     UPPER GASTROINTESTINAL ENDOSCOPY  2011    There were no vitals filed for this visit.    Subjective Assessment - 05/06/20 0854    Subjective Pt. indicated fall on stairs in March resulting in current presentation.  Pt. indicated wearing ankle brace for a period of time after but not using brace at this time.    Limitations Walking;Standing    Diagnostic tests MRI    Currently in Pain? Yes    Pain Score 3    at worst   Pain Location Ankle    Pain Orientation Left    Pain Descriptors / Indicators Tingling;Sharp    Pain Onset More than a month ago    Pain Frequency Intermittent    Aggravating Factors  wearing high heels, occasional insidious onset c movement.    Pain Relieving Factors rest    Effect of Pain on Daily Activities Limiting in exercise routine (jumping jacks).              [facility] PT Assessment - 05/06/20 0001      Assessment   Medical Diagnosis Lt ankle anterior TFL severe sprain Grade 3    Referring Provider (PT) Dr. Ninfa Linden    Onset Date/Surgical Date 12/31/19    Hand Dominance Right      Precautions   Precautions  None      Restrictions   Weight Bearing Restrictions No      Balance Screen   Has the patient fallen in the past 6 months Yes    How many times? 1    Has the patient had a decrease in activity level because of a fear of falling?  No    Is the patient reluctant to leave their home because of a fear of falling?  No      Home Ecologist residence    Home Layout Two level    Alternate Level Stairs-Number of Steps flight of stairs      Prior Function   Level of [place] Requirements work dress requires heels      Cognition   Overall Cognitive Status Within Functional Limits for tasks assessed      Sensation   Light Touch Appears Intact      Functional Tests   Functional tests Squat;Single leg stance      Squat   Comments Equal bilateral, no pain indicated        Single Leg Stance   Comments L SLS 20 seconds c mild aberrant movement.  R SLS 30 seconds steady      ROM / Strength   AROM / PROM / Strength Strength;PROM;AROM      AROM   Overall AROM Comments Mild discomfort end range inversion/eversion Lt ankle    AROM Assessment Site Ankle    Right/Left Ankle Left;Right    Right Ankle Dorsiflexion 20    Right Ankle Plantar Flexion 58    Right Ankle Inversion 40    Right Ankle Eversion 20    Left Ankle Dorsiflexion 16    Left Ankle Plantar Flexion 58    Left Ankle Inversion 30    Left Ankle Eversion 20      PROM   Overall PROM Comments PROM equal to AROM Lt ankle movement    PROM Assessment Site Ankle    Right/Left Ankle Left;Right      Strength   Strength Assessment Site Ankle;Knee;Hip    Right/Left Hip Right;Left    Right/Left Knee Left;Right    Right Knee Flexion 5/5    Right Knee Extension 5/5    Left Knee Flexion 5/5    Left Knee Extension 5/5    Right/Left Ankle Left;Right    Right Ankle Dorsiflexion 5/5    Right Ankle Plantar Flexion 5/5    Right Ankle Inversion 5/5    Right Ankle Eversion 5/5    Left Ankle Dorsiflexion 5/5    Left Ankle Plantar Flexion 4/5    Left Ankle Inversion 4/5    Left Ankle Eversion 4/5      Palpation   Palpation comment Very mild tenderness over ATF ligament.  Gross passive accessory movmeent Lt ankle equal to Rt s symptoms      Ambulation/Gait   Gait Comments Independent ambulation s deviation today in sandals                       Objective measurements completed on examination: See above findings.       [facility] Adult PT Treatment/Exercise - 05/06/20 0001      Exercises   Exercises Other Exercises    Other Exercises  HEP instruction/performance c handout consisting of ankle PF, DF, inversion, Eversion blue band performed 15 x each way in clinic, SLS 30 sec x 3  PT Education - 05/06/20 0925    Education Details HEP, POC    Person(s) Educated  Patient    Methods Explanation;Verbal cues;Handout    Comprehension Verbalized understanding;Returned demonstration               PT Long Term Goals - 05/06/20 1017      PT LONG TERM GOAL #1   Title Patient will demonstrate/report pain at worst less than or equal to 2/10 to facilitate minimal limitation in daily activity secondary to pain symptoms.    Status New    Target Date 06/17/20      PT LONG TERM GOAL #2   Title Patient will demonstrate independent use of home exercise program to facilitate ability to maintain/progress functional gains from skilled physical therapy services.    Period Weeks    Target Date 06/17/20      PT LONG TERM GOAL #3   Title Pt. will demonstrate Lt ankle AROM WFL equal to Rt s symptoms to facilitate usual heel shoe wearing, mobility at PLOF.    Status New    Target Date 06/17/20      PT LONG TERM GOAL #4   Title Pt. will demonstrate Lt ankle MMT equal to Rt s symptoms to facilitate usual workout activity at PLOF.    Status New    Target Date 06/17/20      PT LONG TERM GOAL #5   Title Pt. will demonstrate bilateral SLS 30 seconds to facilitate single leg stance stability in ambulation, workout routine.    Status New    Target Date 06/17/20                  Plan - 05/06/20 0925    Clinical Impression Statement Patient is a 49 y.o female who comes to clinic with complaints of Lt ankle pain with mobility, strength and movement coordination deficits that impair her ability to perform usual daily and recreational functional activities without increase difficulty/symptoms at this time.  Patient to benefit from skilled PT services to address impairments and limitations to improve to previous level of function without restriction secondary to condition.    Personal Factors and Comorbidities Other   IBS, interstitial cystitis, past hx of fibromyalgia, alopecia, anxiety/depresssion   Examination-Activity Limitations Squat;Stairs;Stand;Locomotion  Level    Examination-Participation Restrictions Community Activity;Other   dress code for work, exercise routine   Stability/Clinical Decision Making Stable/Uncomplicated    Clinical Decision Making Low    Rehab Potential Good    PT Frequency Other (comment)   1-2x/week   PT Duration 6 weeks    PT Treatment/Interventions ADLs/Self Care Home Management;Electrical Stimulation;Iontophoresis 4mg /ml Dexamethasone;Moist Heat;Balance training;Therapeutic exercise;Therapeutic activities;Functional mobility training;Stair training;Gait training;Ultrasound;Neuromuscular re-education;Patient/family education;Manual techniques;Taping;Dry needling;Vasopneumatic Device;Passive range of motion;Spinal Manipulations;Joint Manipulations    PT Next Visit Plan Review HEP, progress complaint surface balance control.    PT Home Exercise Plan [email or web address]    Consulted and Agree with Plan of Care Patient           Patient will benefit from skilled therapeutic intervention in order to improve the following deficits and impairments:  Decreased endurance, Decreased activity tolerance, Decreased strength, Pain, Difficulty walking, Decreased mobility, Decreased balance, Decreased range of motion, Impaired flexibility, Decreased coordination  Visit Diagnosis: Pain in left ankle and joints of left foot  Muscle weakness (generalized)  Difficulty in walking, not elsewhere classified     Problem List Patient Active Problem List   Diagnosis Date Noted   Nausea 03/02/2020   Alopecia 06/27/2019  Hyperlipidemia 12/27/2018   Rash 02/18/2018   Neck pain on right side 12/02/2014   Fatigue 01/27/2014   Perimenopausal 07/22/2013   Shoulder pain, left 04/11/2012   Otitis media 03/16/2012   Palpitations 02/14/2012   UTI (lower urinary tract infection) 08/28/2011   Anxiety and depression 07/31/2011   Vitamin D deficiency 07/02/2011   Multiple allergies 07/02/2011   Interstitial cystitis    IBS  (irritable bowel syndrome)    GERD (gastroesophageal reflux disease)    Premature atrial contractions    Adult ADHD     Scot Jun, PT, DPT, OCS, ATC 05/06/20  9:29 AM    [facility] Physical Therapy [address] [place], [place], [address] Phone: [phone]   Fax:  [phone]  Name: Berdine Rasmusson MRN: [id number] Date of Birth: 27-Apr-1971

## 2020-05-20 ENCOUNTER — Encounter: Payer: 59 | Admitting: Physical Therapy

## 2020-05-29 ENCOUNTER — Other Ambulatory Visit: Payer: Self-pay

## 2020-05-29 ENCOUNTER — Encounter: Payer: Self-pay | Admitting: Rehabilitative and Restorative Service Providers"

## 2020-05-29 ENCOUNTER — Ambulatory Visit (INDEPENDENT_AMBULATORY_CARE_PROVIDER_SITE_OTHER): Payer: 59 | Admitting: Rehabilitative and Restorative Service Providers"

## 2020-05-29 DIAGNOSIS — M25572 Pain in left ankle and joints of left foot: Secondary | ICD-10-CM | POA: Diagnosis not present

## 2020-05-29 DIAGNOSIS — R262 Difficulty in walking, not elsewhere classified: Secondary | ICD-10-CM | POA: Diagnosis not present

## 2020-05-29 DIAGNOSIS — M6281 Muscle weakness (generalized): Secondary | ICD-10-CM | POA: Diagnosis not present

## 2020-05-29 NOTE — Therapy (Signed)
[facility] Physical Therapy [address] [place], [place], [address] Phone: [phone]   Fax:  [phone]  Physical Therapy Treatment  Patient Details  Name: [name] MRN: [id number] Date of Birth: [birth year] Referring Provider (PT): Dr. Ninfa Linden   Encounter Date: 05/29/2020   PT End of Session - 05/29/20 0852    Visit Number 2    Number of Visits 6    Date for PT Re-Evaluation 06/17/20    PT Start Time 0847    PT Stop Time 0925    PT Time Calculation (min) 38 min    Activity Tolerance Patient tolerated treatment well    Behavior During Therapy [facility] for tasks assessed/performed           Past Medical History:  Diagnosis Date  . Allergy   . Alopecia   . Anxiety and depression 07/31/2011  . Chest pain 09/22/10   Sharp pains in left breast & sometimes in the right breast as well. Random in occurence & nonexertional. Also has occasional SOB & DOE when going up stairs & occasionally when working out. Nuclear Stress Treadmill 09/29/10 -   . HSV-2 (herpes simplex virus 2) infection   . IBS (irritable bowel syndrome)   . Interstitial cystitis   . Multiple allergies 07/02/2011  . Neuromuscular disorder ([place])    past hx dx of Fibromyalgia  . Otitis media 03/16/2012  . Perimenopausal 07/22/2013  . PONV (postoperative nausea and vomiting)   . Poor concentration 07/02/2011  . Shoulder pain, left 04/11/2012  . SOB (shortness of breath) 09/22/10   ECHO 09/29/10 - NL LVF, trivial PE, trivial TR/PR.  [place] UTI (lower urinary tract infection) 08/28/2011    Past Surgical History:  Procedure Laterality Date  . BREAST BIOPSY Right 03/12/2016   Procedure: breast exploration;  Surgeon: Jackolyn Confer, MD;  Location: [place];  Service: General;  Laterality: Right;  . BREAST ENHANCEMENT SURGERY  2007  . CESAREAN SECTION  2001 and 2004   x2  . COLONOSCOPY  2007  . LAPAROSCOPY     twice  . RHINOPLASTY  2003  . TONSILLECTOMY  1982  .  TUBAL LIGATION    . UPPER GASTROINTESTINAL ENDOSCOPY  2011    There were no vitals filed for this visit.   Subjective Assessment - 05/29/20 0851    Subjective Pt. indicated no pain complaints really since last visit.  Pt. stated agility seems to be the concern.    Limitations Walking;Standing    Diagnostic tests MRI    Currently in Pain? No/denies    Pain Score 0-No pain    Pain Onset More than a month ago                             [facility] Adult PT Treatment/Exercise - 05/29/20 0001      Neuro Re-ed    Neuro Re-ed Details  star excursion 5 cones x 10 bilateral c slider, SLS on foam 30 sec x 5 bilateral, SLS c 3 anterior cone touching x 10 bilateral      Exercises   Exercises Ankle      Ankle Exercises: Stretches   Gastroc Stretch 3 reps;30 seconds   incline board bilateral   Other Stretch PF stretch education seated c overpressure      Ankle Exercises: Aerobic   Nustep Lvl 6 6 mins      Ankle Exercises: Standing   Other Standing Ankle Exercises RDL  5 lbs x 10 bilateral    Other Standing Ankle Exercises 3 cones lateral stepping SL loading x 10 bilateral                       PT Long Term Goals - 05/29/20 4696      PT LONG TERM GOAL #1   Title Patient will demonstrate/report pain at worst less than or equal to 2/10 to facilitate minimal limitation in daily activity secondary to pain symptoms.    Status On-going    Target Date 06/17/20      PT LONG TERM GOAL #2   Title Patient will demonstrate independent use of home exercise program to facilitate ability to maintain/progress functional gains from skilled physical therapy services.    Period Weeks    Status On-going    Target Date 06/17/20      PT LONG TERM GOAL #3   Title Pt. will demonstrate Lt ankle AROM WFL equal to Rt s symptoms to facilitate usual heel shoe wearing, mobility at PLOF.    Status On-going    Target Date 06/17/20      PT LONG TERM GOAL #4   Title Pt. will  demonstrate Lt ankle MMT equal to Rt s symptoms to facilitate usual workout activity at PLOF.    Status On-going    Target Date 06/17/20      PT LONG TERM GOAL #5   Title Pt. will demonstrate bilateral SLS 30 seconds to facilitate single leg stance stability in ambulation, workout routine.    Status On-going    Target Date 06/17/20                 Plan - 05/29/20 0919    Clinical Impression Statement Overall presentation well today c chief complaint tightness in anterior lateral ankle c PF end range.  Pt. has demonstrated gains in movement coordination control on both complaint and non complaint surface.  Continued skilled PT services indicated to address PF concerns.    Personal Factors and Comorbidities Other   IBS, interstitial cystitis, past hx of fibromyalgia, alopecia, anxiety/depresssion   Examination-Activity Limitations Squat;Stairs;Stand;Locomotion Level    Examination-Participation Restrictions Community Activity;Other   dress code for work, exercise routine   Stability/Clinical Decision Making Stable/Uncomplicated    Rehab Potential Good    PT Frequency Other (comment)   1-2x/week   PT Duration 6 weeks    PT Treatment/Interventions ADLs/Self Care Home Management;Electrical Stimulation;Iontophoresis 4mg /ml Dexamethasone;Moist Heat;Balance training;Therapeutic exercise;Therapeutic activities;Functional mobility training;Stair training;Gait training;Ultrasound;Neuromuscular re-education;Patient/family education;Manual techniques;Taping;Dry needling;Vasopneumatic Device;Passive range of motion;Spinal Manipulations;Joint Manipulations    PT Next Visit Plan PF mobility gains.    PT Home Exercise Plan [email or web address]    Consulted and Agree with Plan of Care Patient           Patient will benefit from skilled therapeutic intervention in order to improve the following deficits and impairments:  Decreased endurance, Decreased activity tolerance, Decreased strength, Pain, Difficulty  walking, Decreased mobility, Decreased balance, Decreased range of motion, Impaired flexibility, Decreased coordination  Visit Diagnosis: Pain in left ankle and joints of left foot  Muscle weakness (generalized)  Difficulty in walking, not elsewhere classified     Problem List Patient Active Problem List   Diagnosis Date Noted  . Nausea 03/02/2020  . Alopecia 06/27/2019  . Hyperlipidemia 12/27/2018  . Rash 02/18/2018  . Neck pain on right side 12/02/2014  . Fatigue 01/27/2014  . Perimenopausal 07/22/2013  . Shoulder pain, left 04/11/2012  . Otitis  media 03/16/2012  . Palpitations 02/14/2012  . UTI (lower urinary tract infection) 08/28/2011  . Anxiety and depression 07/31/2011  . Vitamin D deficiency 07/02/2011  . Multiple allergies 07/02/2011  . Interstitial cystitis   . IBS (irritable bowel syndrome)   . GERD (gastroesophageal reflux disease)   . Premature atrial contractions   . Adult ADHD     Scot Jun, PT, DPT, OCS, ATC 05/29/20  9:32 AM    [facility] Physical Therapy [address] [place], [place], [address] Phone: [phone]   Fax:  [phone]  Name: [name] MRN: [id number] Date of Birth: Sep 27, 1971

## 2020-06-05 ENCOUNTER — Telehealth (INDEPENDENT_AMBULATORY_CARE_PROVIDER_SITE_OTHER): Payer: 59 | Admitting: Family Medicine

## 2020-06-05 ENCOUNTER — Other Ambulatory Visit: Payer: Self-pay

## 2020-06-05 VITALS — HR 68 | Temp 98.1°F | Wt 125.0 lb

## 2020-06-05 DIAGNOSIS — M6289 Other specified disorders of muscle: Secondary | ICD-10-CM | POA: Insufficient documentation

## 2020-06-05 DIAGNOSIS — R5383 Other fatigue: Secondary | ICD-10-CM

## 2020-06-05 DIAGNOSIS — N3010 Interstitial cystitis (chronic) without hematuria: Secondary | ICD-10-CM

## 2020-06-05 DIAGNOSIS — F909 Attention-deficit hyperactivity disorder, unspecified type: Secondary | ICD-10-CM | POA: Diagnosis not present

## 2020-06-05 DIAGNOSIS — E559 Vitamin D deficiency, unspecified: Secondary | ICD-10-CM | POA: Diagnosis not present

## 2020-06-05 DIAGNOSIS — R3911 Hesitancy of micturition: Secondary | ICD-10-CM | POA: Diagnosis not present

## 2020-06-05 NOTE — Assessment & Plan Note (Signed)
With increasing urinary hesitancy and a sense of incomplete emptying of bladder. Referred to PT and urology

## 2020-06-05 NOTE — Assessment & Plan Note (Signed)
Doing well on current meds, no changes at this time

## 2020-06-05 NOTE — Assessment & Plan Note (Signed)
Supplement and monitor 

## 2020-06-05 NOTE — Assessment & Plan Note (Signed)
Her previous urologist Dr Amalia Hailey has shifted to [facility] and her insurance will not let her go there so she is given a new referral to [facility] for ongoing care

## 2020-06-05 NOTE — Progress Notes (Signed)
Virtual Visit via Video Note  I connected with [name] on 06/05/20 at 11:00 AM EDT by a video enabled telemedicine application and verified that I am speaking with the correct person using two identifiers.  Location: Patient: home, patient and provider were in visit Provider: home   I discussed the limitations of evaluation and management by telemedicine and the availability of in person appointments. The patient expressed understanding and agreed to proceed. Kem Boroughs, CMA was able to get the patient set up on a video visit    Subjective:    Patient ID: [name], female    DOB: Nov 06, 1970, 49 y.o.   MRN: [id number]  Chief Complaint  Patient presents with  . Follow-up    HPI Patient is in today for follow up on chronic medical concerns. She is concerned about several months of worsening urinary hesitancy. She has trouble starting at times and completing at times. Some dribbling after completion as well. No dysuria or hematuria. No sign of acute illness or recent febrile illness. Otherwise she is doing well. She increase her water intake and her nausea and fatigue have improved some. Denies CP/palp/SOB/HA/congestion/fevers/GI or GU c/o. Taking meds as prescribed  Past Medical History:  Diagnosis Date  . Allergy   . Alopecia   . Anxiety and depression 07/31/2011  . Chest pain 09/22/10   Sharp pains in left breast & sometimes in the right breast as well. Random in occurence & nonexertional. Also has occasional SOB & DOE when going up stairs & occasionally when working out. Nuclear Stress Treadmill 09/29/10 -   . HSV-2 (herpes simplex virus 2) infection   . IBS (irritable bowel syndrome)   . Interstitial cystitis   . Multiple allergies 07/02/2011  . Neuromuscular disorder ([place])    past hx dx of Fibromyalgia  . Otitis media 03/16/2012  . Perimenopausal 07/22/2013  . PONV (postoperative nausea and vomiting)   . Poor concentration 07/02/2011  .  Shoulder pain, left 04/11/2012  . SOB (shortness of breath) 09/22/10   ECHO 09/29/10 - NL LVF, trivial PE, trivial TR/PR.  Marland Kitchen UTI (lower urinary tract infection) 08/28/2011    Past Surgical History:  Procedure Laterality Date  . BREAST BIOPSY Right 03/12/2016   Procedure: breast exploration;  Surgeon: Jackolyn Confer, MD;  Location: [facility];  Service: General;  Laterality: Right;  . BREAST ENHANCEMENT SURGERY  2007  . CESAREAN SECTION  2001 and 2004   x2  . COLONOSCOPY  2007  . LAPAROSCOPY     twice  . RHINOPLASTY  2003  . TONSILLECTOMY  1982  . TUBAL LIGATION    . UPPER GASTROINTESTINAL ENDOSCOPY  2011    Family History  Problem Relation Age of Onset  . Heart disease Mother        MVP  . Anxiety disorder Mother   . Mitral valve prolapse Mother   . Alzheimer's disease Mother   . Colon polyps Mother   . Hypertension Brother   . Obesity Brother   . Diabetes Brother        DM, obese  . Allergies Daughter   . Allergies Son   . Kidney disease Maternal Grandmother        uremia  . Breast cancer Neg Hx   . Colon cancer Neg Hx   . Esophageal cancer Neg Hx   . Rectal cancer Neg Hx   . Stomach cancer Neg Hx     Social History   Socioeconomic History  .  Marital status: Divorced    Spouse name: Not on file  . Number of children: Not on file  . Years of education: Not on file  . Highest education level: Not on file  Occupational History  . Not on file  Tobacco Use  . Smoking status: Never Smoker  . Smokeless tobacco: Never Used  Vaping Use  . Vaping Use: Never used  Substance and Sexual Activity  . Alcohol use: Yes    Alcohol/week: 0.0 standard drinks    Comment: RARELY  . Drug use: No  . Sexual activity: Not Currently    Birth control/protection: Surgical    Comment: Tubal lig-1st intercourse 49 yo-More than 5 partners  Other Topics Concern  . Not on file  Social History Narrative  . Not on file   Social Determinants of Health   Financial  Resource Strain:   . Difficulty of Paying Living Expenses:   Food Insecurity:   . Worried About Charity fundraiser in the Last Year:   . Arboriculturist in the Last Year:   Transportation Needs:   . Film/video editor (Medical):   Marland Kitchen Lack of Transportation (Non-Medical):   Physical Activity:   . Days of Exercise per Week:   . Minutes of Exercise per Session:   Stress:   . Feeling of Stress :   Social Connections:   . Frequency of Communication with Friends and Family:   . Frequency of Social Gatherings with Friends and Family:   . Attends Religious Services:   . Active Member of Clubs or Organizations:   . Attends Archivist Meetings:   Marland Kitchen Marital Status:   Intimate Partner Violence:   . Fear of Current or Ex-Partner:   . Emotionally Abused:   Marland Kitchen Physically Abused:   . Sexually Abused:     Outpatient Medications Prior to Visit  Medication Sig Dispense Refill  . ALPRAZolam (XANAX) 0.25 MG tablet Take 1 tablet (0.25 mg total) by mouth 2 (two) times daily as needed for anxiety. 10 tablet 0  . amphetamine-dextroamphetamine (ADDERALL) 20 MG tablet Take 1 tablet (20 mg total) by mouth 2 (two) times daily. October 2020 (Patient taking differently: Take 20 mg by mouth 2 (two) times daily. October 2020 PRN only) 60 tablet 0  . buPROPion (WELLBUTRIN XL) 150 MG 24 hr tablet Take 2 tablets (300 mg total) by mouth daily. 180 tablet 0  . calcium carbonate (OSCAL) 1500 (600 Ca) MG TABS tablet Take by mouth.    . Cholecalciferol (VITAMIN D3 SUPER STRENGTH) 50 MCG (2000 UT) TABS Take 1 tablet by mouth daily.    . citalopram (CELEXA) 10 MG tablet TAKE 1 TABLET BY MOUTH EVERY DAY 90 tablet 1  . cyclobenzaprine (FLEXERIL) 10 MG tablet TAKE 0.5-1 TABLETS (5-10 MG TOTAL) BY MOUTH AT BEDTIME AS NEEDED FOR MUSCLE SPASMS. 30 tablet 1  . estradiol (ESTRACE) 0.5 MG tablet Take 2 tablets (1 mg total) by mouth daily. (Patient taking differently: Take 0.5 mg by mouth daily. ) 180 tablet 4  .  fexofenadine (ALLEGRA) 180 MG tablet Take 180 mg by mouth daily.      . finasteride (PROSCAR) 5 MG tablet Take 1 tablet (5 mg total) by mouth daily. 90 tablet 0  . flunisolide (NASALIDE) 25 MCG/ACT (0.025%) SOLN PLEASE SPECIFY DIRECTIONS, REFILLS AND QUANTITY 75 mL 1  . hydrOXYzine (ATARAX/VISTARIL) 10 MG tablet Take 10 mg by mouth daily.    . hydrOXYzine (ATARAX/VISTARIL) 25 MG tablet Take 1  tablet by mouth at bedtime as needed for other. "for interstitial cystitis"    . nitrofurantoin, macrocrystal-monohydrate, (MACROBID) 100 MG capsule Take 100 mg by mouth as needed.    Marland Kitchen OVER THE COUNTER MEDICATION FOLEXIN with biotin 2  caps BID    . progesterone (PROMETRIUM) 100 MG capsule Take 1 capsule (100 mg total) by mouth at bedtime. 90 capsule 4  . Sennosides-Docusate Sodium (SM NATURAL LAXATIVE/STOOL SOFT PO) Take 1 tablet by mouth.    . spironolactone (ALDACTONE) 100 MG tablet Take 100 mg by mouth daily.    Marland Kitchen tretinoin (RETIN-A) 0.025 % cream     . valACYclovir (VALTREX) 500 MG tablet Take 1 tablet (500 mg total) by mouth daily. 90 tablet 4   No facility-administered medications prior to visit.    No Known Allergies  Review of Systems  Constitutional: Negative for fever and malaise/fatigue.  HENT: Negative for congestion.   Eyes: Negative for blurred vision.  Respiratory: Negative for shortness of breath.   Cardiovascular: Negative for chest pain, palpitations and leg swelling.  Gastrointestinal: Positive for nausea. Negative for abdominal pain, blood in stool and vomiting.  Genitourinary: Negative for dysuria, flank pain, frequency, hematuria and urgency.  Musculoskeletal: Negative for falls.  Skin: Negative for rash.  Neurological: Negative for dizziness, loss of consciousness and headaches.  Endo/Heme/Allergies: Negative for environmental allergies.  Psychiatric/Behavioral: Negative for depression. The patient is not nervous/anxious.        Objective:    Physical  Exam Constitutional:      Appearance: Normal appearance.  HENT:     Head: Normocephalic and atraumatic.     Right Ear: External ear normal.     Left Ear: External ear normal.     Nose: Nose normal.  Eyes:     General:        Right eye: No discharge.        Left eye: No discharge.  Pulmonary:     Effort: Pulmonary effort is normal.  Neurological:     Mental Status: She is alert and oriented to person, place, and time.  Psychiatric:        Behavior: Behavior normal.     Pulse 68   Temp 98.1 F (36.7 C) Comment: unable to obtain  Wt 125 lb (56.7 kg)   LMP 07/03/2015   SpO2 99%   BMI 20.80 kg/m  Wt Readings from Last 3 Encounters:  06/05/20 125 lb (56.7 kg)  04/04/20 125 lb (56.7 kg)  03/21/20 125 lb (56.7 kg)    Diabetic Foot Exam - Simple   No data filed     Lab Results  Component Value Date   WBC 4.2 12/03/2019   HGB 12.6 12/03/2019   HCT 37.9 12/03/2019   PLT 190.0 12/03/2019   GLUCOSE 100 (H) 12/03/2019   CHOL 181 07/26/2019   TRIG 64.0 07/26/2019   HDL 53.10 07/26/2019   LDLCALC 116 (H) 07/26/2019   ALT 15 12/03/2019   AST 15 12/03/2019   NA 140 12/03/2019   K 4.0 12/03/2019   CL 104 12/03/2019   CREATININE 0.71 12/03/2019   BUN 21 12/03/2019   CO2 32 12/03/2019   TSH 1.56 12/03/2019    Lab Results  Component Value Date   TSH 1.56 12/03/2019   Lab Results  Component Value Date   WBC 4.2 12/03/2019   HGB 12.6 12/03/2019   HCT 37.9 12/03/2019   MCV 88.7 12/03/2019   PLT 190.0 12/03/2019   Lab Results  Component Value  Date   NA 140 12/03/2019   K 4.0 12/03/2019   CO2 32 12/03/2019   GLUCOSE 100 (H) 12/03/2019   BUN 21 12/03/2019   CREATININE 0.71 12/03/2019   BILITOT 0.4 12/03/2019   ALKPHOS 35 (L) 12/03/2019   AST 15 12/03/2019   ALT 15 12/03/2019   PROT 6.6 12/03/2019   ALBUMIN 4.3 12/03/2019   CALCIUM 9.3 12/03/2019   GFR 86.78 12/03/2019   Lab Results  Component Value Date   CHOL 181 07/26/2019   Lab Results  Component  Value Date   HDL 53.10 07/26/2019   Lab Results  Component Value Date   LDLCALC 116 (H) 07/26/2019   Lab Results  Component Value Date   TRIG 64.0 07/26/2019   Lab Results  Component Value Date   CHOLHDL 3 07/26/2019   No results found for: HGBA1C     Assessment & Plan:   Problem List Items Addressed This Visit    Interstitial cystitis    Her previous urologist Dr Amalia Hailey has shifted to [facility] and her insurance will not let her go there so she is given a new referral to [facility] for ongoing care      Relevant Orders   Ambulatory referral to Urology   Urinalysis   Urine Culture   Adult ADHD    Doing well on current meds, no changes at this time      Vitamin D deficiency    Supplement and monitor      Fatigue    Improved with increased hydratation, exercise and with time.      Urinary hesitancy - Primary   Relevant Orders   Ambulatory referral to Urology   Ambulatory referral to Physical Therapy   Urinalysis   Urine Culture   Pelvic floor dysfunction    With increasing urinary hesitancy and a sense of incomplete emptying of bladder. Referred to PT and urology      Relevant Orders   Ambulatory referral to Physical Therapy      I am having Daysie J. Pinera "Blanca" maintain her fexofenadine, tretinoin, hydrOXYzine, Cholecalciferol, spironolactone, amphetamine-dextroamphetamine, ALPRAZolam, flunisolide, nitrofurantoin (macrocrystal-monohydrate), estradiol, progesterone, cyclobenzaprine, valACYclovir, citalopram, hydrOXYzine, calcium carbonate, OVER THE COUNTER MEDICATION, Sennosides-Docusate Sodium (SM NATURAL LAXATIVE/STOOL SOFT PO), buPROPion, and finasteride.  No orders of the defined types were placed in this encounter.   I discussed the assessment and treatment plan with the patient. The patient was provided an opportunity to ask questions and all were answered. The patient agreed with the plan and demonstrated an understanding of the instructions.    The patient was advised to call back or seek an in-person evaluation if the symptoms worsen or if the condition fails to improve as anticipated.  I provided 20 minutes of non-face-to-face time during this encounter.   Penni Homans, MD

## 2020-06-05 NOTE — Assessment & Plan Note (Signed)
Improved with increased hydratation, exercise and with time.

## 2020-06-06 ENCOUNTER — Encounter: Payer: Self-pay | Admitting: Rehabilitative and Restorative Service Providers"

## 2020-06-06 ENCOUNTER — Ambulatory Visit (INDEPENDENT_AMBULATORY_CARE_PROVIDER_SITE_OTHER): Payer: 59 | Admitting: Rehabilitative and Restorative Service Providers"

## 2020-06-06 DIAGNOSIS — M25572 Pain in left ankle and joints of left foot: Secondary | ICD-10-CM

## 2020-06-06 DIAGNOSIS — R262 Difficulty in walking, not elsewhere classified: Secondary | ICD-10-CM

## 2020-06-06 DIAGNOSIS — M6281 Muscle weakness (generalized): Secondary | ICD-10-CM | POA: Diagnosis not present

## 2020-06-06 NOTE — Therapy (Signed)
[facility] Physical Therapy [address] [place], [place], [address] Phone: [phone]   Fax:  [phone]  Physical Therapy Treatment/Discharge  Patient Details  Name: [name] MRN: [id number] Date of Birth: May 08, 1971 Referring Provider (PT): Dr. Ninfa Linden   Encounter Date: 06/06/2020   PT End of Session - 06/06/20 0952    Visit Number 3    Number of Visits 6    Date for PT Re-Evaluation 06/17/20    PT Start Time 0927    PT Stop Time 1005    PT Time Calculation (min) 38 min    Activity Tolerance Patient tolerated treatment well    Behavior During Therapy [facility] for tasks assessed/performed           Past Medical History:  Diagnosis Date   Allergy    Alopecia    Anxiety and depression 07/31/2011   Chest pain 09/22/10   Sharp pains in left breast & sometimes in the right breast as well. Random in occurence & nonexertional. Also has occasional SOB & DOE when going up stairs & occasionally when working out. Nuclear Stress Treadmill 09/29/10 -    HSV-2 (herpes simplex virus 2) infection    IBS (irritable bowel syndrome)    Interstitial cystitis    Multiple allergies 07/02/2011   Neuromuscular disorder ([place])    past hx dx of Fibromyalgia   Otitis media 03/16/2012   Perimenopausal 07/22/2013   PONV (postoperative nausea and vomiting)    Poor concentration 07/02/2011   Shoulder pain, left 04/11/2012   SOB (shortness of breath) 09/22/10   ECHO 09/29/10 - NL LVF, trivial PE, trivial TR/PR.   UTI (lower urinary tract infection) 08/28/2011    Past Surgical History:  Procedure Laterality Date   BREAST BIOPSY Right 03/12/2016   Procedure: breast exploration;  Surgeon: Jackolyn Confer, MD;  Location: [facility];  Service: General;  Laterality: Right;   BREAST ENHANCEMENT SURGERY  2007   CESAREAN SECTION  2001 and 2004   x2   COLONOSCOPY  2007   LAPAROSCOPY     twice   RHINOPLASTY  2003   [place]    TUBAL LIGATION     UPPER GASTROINTESTINAL ENDOSCOPY  2011    There were no vitals filed for this visit.   Subjective Assessment - 06/06/20 0935    Subjective Pt. reported improvement in symptoms and no pain upon arrival today.  Returning to most of usual activity to this point.    Limitations Walking;Standing    Diagnostic tests MRI    Currently in Pain? No/denies    Pain Score 0-No pain    Pain Onset More than a month ago              [facility] PT Assessment - 06/06/20 0001      Assessment   Medical Diagnosis Lt ankle anterior TFL severe sprain Grade 3    Referring Provider (PT) Dr. Ninfa Linden    Onset Date/Surgical Date 12/31/19    Hand Dominance Right      AROM   Overall AROM Comments WFL at this time bilateral      Strength   Left Ankle Dorsiflexion 5/5    Left Ankle Plantar Flexion 5/5    Left Ankle Inversion 5/5    Left Ankle Eversion 5/5                         [facility] Adult PT Treatment/Exercise - 06/06/20 0001  Neuro Re-ed    Neuro Re-ed Details  rocker board fwd/back, side to side 30 x each way, balance control 90 sec DL, 60 sec SL bilateral, SLS on upside down bosu 30 sec x 1 bilateral, SLS 3 anterior/lateral/medial cones x 10 bilateral,      Ankle Exercises: Aerobic   Recumbent Bike Lvl 4 10 mins      Ankle Exercises: Standing   Other Standing Ankle Exercises lateral step blue band 3 cones x 10 bilateral                  PT Education - 06/06/20 0936    Education Details Review of HEP    Person(s) Educated Patient    Methods Explanation    Comprehension Verbalized understanding               PT Long Term Goals - 06/06/20 0936      PT LONG TERM GOAL #1   Title Patient will demonstrate/report pain at worst less than or equal to 2/10 to facilitate minimal limitation in daily activity secondary to pain symptoms.    Status Achieved      PT LONG TERM GOAL #2   Title Patient will demonstrate independent use of home  exercise program to facilitate ability to maintain/progress functional gains from skilled physical therapy services.    Period Weeks    Status Achieved      PT LONG TERM GOAL #3   Title Pt. will demonstrate Lt ankle AROM WFL equal to Rt s symptoms to facilitate usual heel shoe wearing, mobility at PLOF.    Status Achieved      PT LONG TERM GOAL #4   Title Pt. will demonstrate Lt ankle MMT equal to Rt s symptoms to facilitate usual workout activity at PLOF.    Status Achieved      PT LONG TERM GOAL #5   Title Pt. will demonstrate bilateral SLS 30 seconds to facilitate single leg stance stability in ambulation, workout routine.    Status Achieved                 Plan - 06/06/20 0936    Clinical Impression Statement Pt. has demonstrated good improvement overall and reported minimal symptoms.  Presentation today indicated good HEP and Pt. is appropriate for transition to HEP at this time.    Personal Factors and Comorbidities Other   IBS, interstitial cystitis, past hx of fibromyalgia, alopecia, anxiety/depresssion   Examination-Activity Limitations Squat;Stairs;Stand;Locomotion Level    Examination-Participation Restrictions Community Activity;Other   dress code for work, exercise routine   Stability/Clinical Decision Making Stable/Uncomplicated    Rehab Potential Good    PT Frequency Other (comment)   1-2x/week   PT Duration 6 weeks    PT Treatment/Interventions ADLs/Self Care Home Management;Electrical Stimulation;Iontophoresis 34m/ml Dexamethasone;Moist Heat;Balance training;Therapeutic exercise;Therapeutic activities;Functional mobility training;Stair training;Gait training;Ultrasound;Neuromuscular re-education;Patient/family education;Manual techniques;Taping;Dry needling;Vasopneumatic Device;Passive range of motion;Spinal Manipulations;Joint Manipulations    PT Next Visit Plan D/C to HEP    PT Home Exercise Plan [id number]    Consulted and Agree with Plan of Care Patient             Patient will benefit from skilled therapeutic intervention in order to improve the following deficits and impairments:  Decreased endurance, Decreased activity tolerance, Decreased strength, Pain, Difficulty walking, Decreased mobility, Decreased balance, Decreased range of motion, Impaired flexibility, Decreased coordination  Visit Diagnosis: Pain in left ankle and joints of left foot  Muscle weakness (generalized)  Difficulty in walking, not  elsewhere classified     Problem List Patient Active Problem List   Diagnosis Date Noted   Urinary hesitancy 06/05/2020   Pelvic floor dysfunction 06/05/2020   Nausea 03/02/2020   Alopecia 06/27/2019   Hyperlipidemia 12/27/2018   Neck pain on right side 12/02/2014   Fatigue 01/27/2014   Perimenopausal 07/22/2013   Otitis media 03/16/2012   Palpitations 02/14/2012   Anxiety and depression 07/31/2011   Vitamin D deficiency 07/02/2011   Multiple allergies 07/02/2011   Interstitial cystitis    IBS (irritable bowel syndrome)    GERD (gastroesophageal reflux disease)    Premature atrial contractions    Adult ADHD     PHYSICAL THERAPY DISCHARGE SUMMARY  Visits from Start of Care: 3  Current functional level related to goals / functional outcomes: See note   Remaining deficits: See note  Education / Equipment: HEP Plan: Patient agrees to discharge.  Patient goals were met. Patient is being discharged due to meeting the stated rehab goals.  ?????  Scot Jun, PT, DPT, OCS, ATC 06/06/20  10:10 AM       [facility] Physical Therapy [address] [place], [place], [address] Phone: [phone]   Fax:  [phone]  Name: Kory Panjwani MRN: [id number] Date of Birth: May 09, 1971

## 2020-06-10 ENCOUNTER — Telehealth: Payer: Self-pay | Admitting: Family Medicine

## 2020-06-10 NOTE — Telephone Encounter (Signed)
Caller: Feleshia  Call Back # [phone]  Patient states that she would like to speak with someone about referral to urology. Patient states she has not heard anything about an appointment. Patient states that she would also like to discuss her Physical Therapy appointments.

## 2020-06-11 NOTE — Telephone Encounter (Signed)
Called pt and lvm to return call.  

## 2020-06-12 NOTE — Telephone Encounter (Signed)
Returning  Your call

## 2020-06-12 NOTE — Telephone Encounter (Signed)
Pt called back and told her to call [facility]

## 2020-06-13 ENCOUNTER — Other Ambulatory Visit (INDEPENDENT_AMBULATORY_CARE_PROVIDER_SITE_OTHER): Payer: 59

## 2020-06-13 ENCOUNTER — Encounter: Payer: 59 | Admitting: Rehabilitative and Restorative Service Providers"

## 2020-06-13 DIAGNOSIS — R3911 Hesitancy of micturition: Secondary | ICD-10-CM | POA: Diagnosis not present

## 2020-06-13 DIAGNOSIS — N3010 Interstitial cystitis (chronic) without hematuria: Secondary | ICD-10-CM

## 2020-06-13 LAB — URINALYSIS
Bilirubin Urine: NEGATIVE
Hgb urine dipstick: NEGATIVE
Leukocytes,Ua: NEGATIVE
Nitrite: NEGATIVE
Specific Gravity, Urine: 1.025 (ref 1.000–1.030)
Total Protein, Urine: NEGATIVE
Urine Glucose: NEGATIVE
Urobilinogen, UA: 0.2 (ref 0.0–1.0)
pH: 6 (ref 5.0–8.0)

## 2020-06-15 LAB — URINE CULTURE
MICRO NUMBER:: 10824768
SPECIMEN QUALITY:: ADEQUATE

## 2020-06-16 ENCOUNTER — Other Ambulatory Visit: Payer: Self-pay | Admitting: Family Medicine

## 2020-06-18 ENCOUNTER — Telehealth: Payer: Self-pay | Admitting: Family Medicine

## 2020-06-18 NOTE — Telephone Encounter (Signed)
Caller [name] Call Back # [phone]  Patient states she would like a call back in reference to questions she has about COVID

## 2020-06-20 ENCOUNTER — Encounter: Payer: 59 | Admitting: Rehabilitative and Restorative Service Providers"

## 2020-07-22 ENCOUNTER — Other Ambulatory Visit: Payer: Self-pay | Admitting: Family Medicine

## 2020-09-04 ENCOUNTER — Ambulatory Visit (INDEPENDENT_AMBULATORY_CARE_PROVIDER_SITE_OTHER): Payer: 59 | Admitting: Family Medicine

## 2020-09-04 ENCOUNTER — Other Ambulatory Visit: Payer: Self-pay | Admitting: Family Medicine

## 2020-09-04 ENCOUNTER — Other Ambulatory Visit: Payer: Self-pay

## 2020-09-04 ENCOUNTER — Encounter: Payer: Self-pay | Admitting: Family Medicine

## 2020-09-04 ENCOUNTER — Other Ambulatory Visit (HOSPITAL_COMMUNITY)
Admission: RE | Admit: 2020-09-04 | Discharge: 2020-09-04 | Disposition: A | Discharge status: Home / Self Care | Payer: 59 | Source: Ambulatory Visit | Attending: Family Medicine | Admitting: Family Medicine

## 2020-09-04 VITALS — BP 108/60 | HR 76 | Temp 98.3°F | Resp 16 | Wt 127.2 lb

## 2020-09-04 DIAGNOSIS — N760 Acute vaginitis: Secondary | ICD-10-CM | POA: Insufficient documentation

## 2020-09-04 DIAGNOSIS — Z0000 Encounter for general adult medical examination without abnormal findings: Secondary | ICD-10-CM

## 2020-09-04 DIAGNOSIS — E785 Hyperlipidemia, unspecified: Secondary | ICD-10-CM | POA: Diagnosis not present

## 2020-09-04 DIAGNOSIS — Z01419 Encounter for gynecological examination (general) (routine) without abnormal findings: Secondary | ICD-10-CM

## 2020-09-04 DIAGNOSIS — E559 Vitamin D deficiency, unspecified: Secondary | ICD-10-CM | POA: Diagnosis not present

## 2020-09-04 DIAGNOSIS — K219 Gastro-esophageal reflux disease without esophagitis: Secondary | ICD-10-CM | POA: Diagnosis not present

## 2020-09-04 DIAGNOSIS — F32A Depression, unspecified: Secondary | ICD-10-CM

## 2020-09-04 DIAGNOSIS — F909 Attention-deficit hyperactivity disorder, unspecified type: Secondary | ICD-10-CM

## 2020-09-04 DIAGNOSIS — A64 Unspecified sexually transmitted disease: Secondary | ICD-10-CM

## 2020-09-04 DIAGNOSIS — B009 Herpesviral infection, unspecified: Secondary | ICD-10-CM | POA: Insufficient documentation

## 2020-09-04 DIAGNOSIS — N761 Subacute and chronic vaginitis: Secondary | ICD-10-CM

## 2020-09-04 DIAGNOSIS — Z889 Allergy status to unspecified drugs, medicaments and biological substances status: Secondary | ICD-10-CM

## 2020-09-04 DIAGNOSIS — N959 Unspecified menopausal and perimenopausal disorder: Secondary | ICD-10-CM

## 2020-09-04 DIAGNOSIS — F419 Anxiety disorder, unspecified: Secondary | ICD-10-CM

## 2020-09-04 MED ORDER — VALACYCLOVIR HCL 500 MG PO TABS
500.0000 mg | ORAL_TABLET | Freq: Every day | ORAL | 3 refills | Status: DC
Start: 2020-09-04 — End: 2022-03-18

## 2020-09-04 MED ORDER — FLUNISOLIDE 25 MCG/ACT (0.025%) NA SOLN: NASAL | 11 refills | Status: DC

## 2020-09-04 MED ORDER — PREMARIN 0.625 MG/GM VA CREA
1.0000 | TOPICAL_CREAM | VAGINAL | 2 refills | Status: DC
Start: 2020-09-04 — End: 2020-09-04

## 2020-09-04 NOTE — Progress Notes (Signed)
Subjective:    Patient ID: [name], female    DOB: Mar 18, 1971, 49 y.o.   MRN: [id number]  Chief Complaint  Patient presents with  . Annual Exam    Pt states that she is getting bruising.    HPI Patient is in today for annual preventative exam and follow up on chronic medical concerns.no recent febrile illness or hospitalizations. She is doing well. She is trying to eat well and maintain a heart healthy diet. Her kids are doing well and she is dating again. She has been sexually active and is noting some trouble with vaginal irritation with activity at times. No abdominal or back pain. No fevers or chills. Denies CP/palp/SOB/HA/congestion/fevers/GI or GU c/o. Taking meds as prescribed  Past Medical History:  Diagnosis Date  . Allergy   . Alopecia   . Anxiety and depression 07/31/2011  . Chest pain 09/22/10   Sharp pains in left breast & sometimes in the right breast as well. Random in occurence & nonexertional. Also has occasional SOB & DOE when going up stairs & occasionally when working out. Nuclear Stress Treadmill 09/29/10 -   . HSV-2 (herpes simplex virus 2) infection   . IBS (irritable bowel syndrome)   . Interstitial cystitis   . Multiple allergies 07/02/2011  . Neuromuscular disorder ([place])    past hx dx of Fibromyalgia  . Otitis media 03/16/2012  . Perimenopausal 07/22/2013  . PONV (postoperative nausea and vomiting)   . Poor concentration 07/02/2011  . Shoulder pain, left 04/11/2012  . SOB (shortness of breath) 09/22/10   ECHO 09/29/10 - NL LVF, trivial PE, trivial TR/PR.  Marland Kitchen UTI (lower urinary tract infection) 08/28/2011    Past Surgical History:  Procedure Laterality Date  . BREAST BIOPSY Right 03/12/2016   Procedure: breast exploration;  Surgeon: Jackolyn Confer, MD;  Location: [facility];  Service: General;  Laterality: Right;  . BREAST ENHANCEMENT SURGERY  2007  . CESAREAN SECTION  2001 and 2004   x2  . COLONOSCOPY  2007  . LAPAROSCOPY      twice  . RHINOPLASTY  2003  . TONSILLECTOMY  1982  . TUBAL LIGATION    . UPPER GASTROINTESTINAL ENDOSCOPY  2011    Family History  Problem Relation Age of Onset  . Heart disease Mother        MVP  . Anxiety disorder Mother   . Mitral valve prolapse Mother   . Alzheimer's disease Mother   . Colon polyps Mother   . Hypertension Brother   . Obesity Brother   . Diabetes Brother        DM, obese  . Allergies Daughter   . Allergies Son   . Kidney disease Maternal Grandmother        uremia  . Breast cancer Neg Hx   . Colon cancer Neg Hx   . Esophageal cancer Neg Hx   . Rectal cancer Neg Hx   . Stomach cancer Neg Hx     Social History   Socioeconomic History  . Marital status: Divorced    Spouse name: Not on file  . Number of children: Not on file  . Years of education: Not on file  . Highest education level: Not on file  Occupational History  . Not on file  Tobacco Use  . Smoking status: Never Smoker  . Smokeless tobacco: Never Used  Vaping Use  . Vaping Use: Never used  Substance and Sexual Activity  . Alcohol use: Yes  Alcohol/week: 0.0 standard drinks    Comment: RARELY  . Drug use: No  . Sexual activity: Not Currently    Birth control/protection: Surgical    Comment: Tubal lig-1st intercourse 49 yo-More than 5 partners  Other Topics Concern  . Not on file  Social History Narrative  . Not on file   Social Determinants of Health   Financial Resource Strain:   . Difficulty of Paying Living Expenses: Not on file  Food Insecurity:   . Worried About Charity fundraiser in the Last Year: Not on file  . Ran Out of Food in the Last Year: Not on file  Transportation Needs:   . Lack of Transportation (Medical): Not on file  . Lack of Transportation (Non-Medical): Not on file  Physical Activity:   . Days of Exercise per Week: Not on file  . Minutes of Exercise per Session: Not on file  Stress:   . Feeling of Stress : Not on file  Social Connections:     . Frequency of Communication with Friends and Family: Not on file  . Frequency of Social Gatherings with Friends and Family: Not on file  . Attends Religious Services: Not on file  . Active Member of Clubs or Organizations: Not on file  . Attends Archivist Meetings: Not on file  . Marital Status: Not on file  Intimate Partner Violence:   . Fear of Current or Ex-Partner: Not on file  . Emotionally Abused: Not on file  . Physically Abused: Not on file  . Sexually Abused: Not on file    Outpatient Medications Prior to Visit  Medication Sig Dispense Refill  . amphetamine-dextroamphetamine (ADDERALL) 20 MG tablet Take 1 tablet (20 mg total) by mouth 2 (two) times daily. October 2020 (Patient taking differently: Take 20 mg by mouth 2 (two) times daily. October 2020 PRN only) 60 tablet 0  . buPROPion (WELLBUTRIN XL) 150 MG 24 hr tablet Take 2 tablets (300 mg total) by mouth daily. 180 tablet 0  . Cholecalciferol (VITAMIN D3 SUPER STRENGTH) 50 MCG (2000 UT) TABS Take 1 tablet by mouth daily.    . citalopram (CELEXA) 10 MG tablet TAKE 1 TABLET BY MOUTH EVERY DAY 90 tablet 1  . estradiol (ESTRACE) 0.5 MG tablet Take 2 tablets (1 mg total) by mouth daily. (Patient taking differently: Take 0.5 mg by mouth daily. ) 180 tablet 4  . fexofenadine (ALLEGRA) 180 MG tablet Take 180 mg by mouth daily.      . finasteride (PROSCAR) 5 MG tablet TAKE 1 TABLET BY MOUTH EVERY DAY 90 tablet 0  . hydrOXYzine (ATARAX/VISTARIL) 10 MG tablet Take 10 mg by mouth daily.    . nitrofurantoin, macrocrystal-monohydrate, (MACROBID) 100 MG capsule Take 100 mg by mouth as needed.    Marland Kitchen OVER THE COUNTER MEDICATION FOLEXIN with biotin 2  caps BID    . Sennosides-Docusate Sodium (SM NATURAL LAXATIVE/STOOL SOFT PO) Take 1 tablet by mouth.    . spironolactone (ALDACTONE) 100 MG tablet Take 100 mg by mouth daily.    Marland Kitchen tretinoin (RETIN-A) 0.025 % cream     . ALPRAZolam (XANAX) 0.25 MG tablet Take 1 tablet (0.25 mg total)  by mouth 2 (two) times daily as needed for anxiety. 10 tablet 0  . flunisolide (NASALIDE) 25 MCG/ACT (0.025%) SOLN PLEASE SPECIFY DIRECTIONS, REFILLS AND QUANTITY 75 mL 1  . hydrOXYzine (ATARAX/VISTARIL) 25 MG tablet Take 1 tablet by mouth at bedtime as needed for other. "for interstitial cystitis"    .  progesterone (PROMETRIUM) 100 MG capsule Take 1 capsule (100 mg total) by mouth at bedtime. 90 capsule 4  . valACYclovir (VALTREX) 500 MG tablet Take 1 tablet (500 mg total) by mouth daily. 90 tablet 4  . calcium carbonate (OSCAL) 1500 (600 Ca) MG TABS tablet Take by mouth.    . cyclobenzaprine (FLEXERIL) 10 MG tablet TAKE 0.5-1 TABLETS (5-10 MG TOTAL) BY MOUTH AT BEDTIME AS NEEDED FOR MUSCLE SPASMS. 30 tablet 1   No facility-administered medications prior to visit.    No Known Allergies  Review of Systems  Constitutional: Negative for chills, fever and malaise/fatigue.  HENT: Negative for congestion and hearing loss.   Eyes: Negative for discharge.  Respiratory: Negative for cough, sputum production and shortness of breath.   Cardiovascular: Negative for chest pain, palpitations and leg swelling.  Gastrointestinal: Negative for abdominal pain, blood in stool, constipation, diarrhea, heartburn, nausea and vomiting.  Genitourinary: Negative for dysuria, frequency, hematuria and urgency.  Musculoskeletal: Negative for back pain, falls and myalgias.  Skin: Negative for rash.  Neurological: Negative for dizziness, sensory change, loss of consciousness, weakness and headaches.  Endo/Heme/Allergies: Negative for environmental allergies. Does not bruise/bleed easily.  Psychiatric/Behavioral: Negative for depression and suicidal ideas. The patient is not nervous/anxious and does not have insomnia.        Objective:    Physical Exam Constitutional:      General: She is not in acute distress.    Appearance: She is not diaphoretic.  HENT:     Head: Normocephalic and atraumatic.     Right  Ear: External ear normal.     Left Ear: External ear normal.     Nose: Nose normal.     Mouth/Throat:     Pharynx: No oropharyngeal exudate.  Eyes:     General: No scleral icterus.       Right eye: No discharge.        Left eye: No discharge.     Conjunctiva/sclera: Conjunctivae normal.     Pupils: Pupils are equal, round, and reactive to light.  Neck:     Thyroid: No thyromegaly.  Cardiovascular:     Rate and Rhythm: Normal rate and regular rhythm.     Heart sounds: Normal heart sounds. No murmur heard.   Pulmonary:     Effort: Pulmonary effort is normal. No respiratory distress.     Breath sounds: Normal breath sounds. No wheezing or rales.  Abdominal:     General: Bowel sounds are normal. There is no distension.     Palpations: Abdomen is soft. There is no mass.     Tenderness: There is no abdominal tenderness.  Genitourinary:    General: Normal vulva.     Vagina: Vaginal discharge present.     Rectum: Normal.     Comments: No cervical lesion or erythema. Scan whitish vaginal discharge.  Musculoskeletal:        General: No tenderness. Normal range of motion.     Cervical back: Normal range of motion and neck supple.  Lymphadenopathy:     Cervical: No cervical adenopathy.  Skin:    General: Skin is warm and dry.     Findings: No rash.  Neurological:     Mental Status: She is alert and oriented to person, place, and time.     Cranial Nerves: No cranial nerve deficit.     Coordination: Coordination normal.     Deep Tendon Reflexes: Reflexes are normal and symmetric. Reflexes normal.     BP  108/60   Pulse 76   Temp 98.3 F (36.8 C)   Resp 16   Wt 127 lb 3.2 oz (57.7 kg)   LMP 07/03/2015   SpO2 99%   BMI 21.17 kg/m  Wt Readings from Last 3 Encounters:  09/04/20 127 lb 3.2 oz (57.7 kg)  06/05/20 125 lb (56.7 kg)  04/04/20 125 lb (56.7 kg)    Diabetic Foot Exam - Simple   No data filed     Lab Results  Component Value Date   WBC 4.2 12/03/2019   HGB  12.6 12/03/2019   HCT 37.9 12/03/2019   PLT 190.0 12/03/2019   GLUCOSE 100 (H) 12/03/2019   CHOL 181 07/26/2019   TRIG 64.0 07/26/2019   HDL 53.10 07/26/2019   LDLCALC 116 (H) 07/26/2019   ALT 15 12/03/2019   AST 15 12/03/2019   NA 140 12/03/2019   K 4.0 12/03/2019   CL 104 12/03/2019   CREATININE 0.71 12/03/2019   BUN 21 12/03/2019   CO2 32 12/03/2019   TSH 1.56 12/03/2019    Lab Results  Component Value Date   TSH 1.56 12/03/2019   Lab Results  Component Value Date   WBC 4.2 12/03/2019   HGB 12.6 12/03/2019   HCT 37.9 12/03/2019   MCV 88.7 12/03/2019   PLT 190.0 12/03/2019   Lab Results  Component Value Date   NA 140 12/03/2019   K 4.0 12/03/2019   CO2 32 12/03/2019   GLUCOSE 100 (H) 12/03/2019   BUN 21 12/03/2019   CREATININE 0.71 12/03/2019   BILITOT 0.4 12/03/2019   ALKPHOS 35 (L) 12/03/2019   AST 15 12/03/2019   ALT 15 12/03/2019   PROT 6.6 12/03/2019   ALBUMIN 4.3 12/03/2019   CALCIUM 9.3 12/03/2019   GFR 86.78 12/03/2019   Lab Results  Component Value Date   CHOL 181 07/26/2019   Lab Results  Component Value Date   HDL 53.10 07/26/2019   Lab Results  Component Value Date   LDLCALC 116 (H) 07/26/2019   Lab Results  Component Value Date   TRIG 64.0 07/26/2019   Lab Results  Component Value Date   CHOLHDL 3 07/26/2019   No results found for: HGBA1C     Assessment & Plan:   Problem List Items Addressed This Visit    GERD (gastroesophageal reflux disease)   Adult ADHD    Doing well on current meds no need for changes at this time      Vitamin D deficiency    Supplement and monitor      Relevant Orders   VITAMIN D 25 Hydroxy (Vit-D Deficiency, Fractures)   Multiple allergies   Relevant Medications   flunisolide (NASALIDE) 25 MCG/ACT (0.025%) SOLN   Anxiety and depression    Doing well on current meds. No changes, continue Citalopram 10 mg daily and Alprazolam prn      Hyperlipidemia    Encouraged heart healthy diet,  increase exercise, avoid trans fats, consider a krill oil cap daily      Vaginitis    Has a new partner and has become sexually active for the first time in several years since her divorce. She is experiencing some vaginal irritation and while it may all be attributable to atrophic vaginitis. Will check STDs and she is referred to gynecology for further care.       Relevant Orders   HIV antibody (with reflex)   RPR   HSV infection    Doing on maintenance dose  of Valtrex. Refill given today      Relevant Medications   valACYclovir (VALTREX) 500 MG tablet   Preventative health care    Patient encouraged to maintain heart healthy diet, regular exercise, adequate sleep. Consider daily probiotics. Take medications as prescribed. Labs ordered and reviewed. Referred to GYN for pap and MGM is up to date.      Relevant Orders   CBC   Comprehensive metabolic panel   Lipid panel   TSH    Other Visit Diagnoses    Well woman exam with routine gynecological exam    -  Primary   Relevant Orders   Ambulatory referral to Obstetrics / Gynecology   Post menopausal problems       Relevant Orders   Ambulatory referral to Obstetrics / Gynecology   Sexually transmitted disease (STD)       Relevant Medications   valACYclovir (VALTREX) 500 MG tablet   Other Relevant Orders   Cervicovaginal ancillary only( [place])      I have discontinued [place] [facility] [facility] "Blanca"'s ALPRAZolam, progesterone, cyclobenzaprine, calcium carbonate, and Premarin. I am also having her maintain her fexofenadine, tretinoin, Cholecalciferol, spironolactone, amphetamine-dextroamphetamine, nitrofurantoin (macrocrystal-monohydrate), estradiol, hydrOXYzine, OVER THE COUNTER MEDICATION, Sennosides-Docusate Sodium (SM NATURAL LAXATIVE/STOOL SOFT PO), buPROPion, finasteride, citalopram, flunisolide, and valACYclovir.  Meds ordered this encounter  Medications  . DISCONTD: conjugated estrogens (PREMARIN) vaginal cream     Sig: Place 1 Applicatorful vaginally 2 (two) times a week.    Dispense:  42.5 g    Refill:  2  . flunisolide (NASALIDE) 25 MCG/ACT (0.025%) SOLN    Sig: PLEASE SPECIFY DIRECTIONS, REFILLS AND QUANTITY    Dispense:  75 mL    Refill:  11  . valACYclovir (VALTREX) 500 MG tablet    Sig: Take 1 tablet (500 mg total) by mouth daily.    Dispense:  90 tablet    Refill:  3     Penni Homans, MD

## 2020-09-04 NOTE — Assessment & Plan Note (Signed)
Encouraged heart healthy diet, increase exercise, avoid trans fats, consider a krill oil cap daily 

## 2020-09-04 NOTE — Assessment & Plan Note (Signed)
Supplement and monitor 

## 2020-09-04 NOTE — Assessment & Plan Note (Signed)
Doing well on current meds no need for changes at this time

## 2020-09-04 NOTE — Assessment & Plan Note (Addendum)
Doing well on current meds. No changes, continue Citalopram 10 mg daily and Alprazolam prn

## 2020-09-04 NOTE — Assessment & Plan Note (Signed)
Has a new partner and has become sexually active for the first time in several years since her divorce. She is experiencing some vaginal irritation and while it may all be attributable to atrophic vaginitis. Will check STDs and she is referred to gynecology for further care.

## 2020-09-04 NOTE — Assessment & Plan Note (Signed)
Doing on maintenance dose of Valtrex. Refill given today

## 2020-09-04 NOTE — Assessment & Plan Note (Signed)
Patient encouraged to maintain heart healthy diet, regular exercise, adequate sleep. Consider daily probiotics. Take medications as prescribed. Labs ordered and reviewed. Referred to GYN for pap and MGM is up to date.

## 2020-09-04 NOTE — Patient Instructions (Signed)

## 2020-09-05 ENCOUNTER — Telehealth: Payer: Self-pay | Admitting: Family Medicine

## 2020-09-05 ENCOUNTER — Other Ambulatory Visit (INDEPENDENT_AMBULATORY_CARE_PROVIDER_SITE_OTHER): Payer: 59

## 2020-09-05 DIAGNOSIS — E559 Vitamin D deficiency, unspecified: Secondary | ICD-10-CM

## 2020-09-05 DIAGNOSIS — Z0000 Encounter for general adult medical examination without abnormal findings: Secondary | ICD-10-CM

## 2020-09-05 DIAGNOSIS — N761 Subacute and chronic vaginitis: Secondary | ICD-10-CM

## 2020-09-05 LAB — COMPREHENSIVE METABOLIC PANEL
ALT: 16 U/L (ref 0–35)
AST: 17 U/L (ref 0–37)
Albumin: 4.7 g/dL (ref 3.5–5.2)
Alkaline Phosphatase: 38 U/L — ABNORMAL LOW (ref 39–117)
BUN: 22 mg/dL (ref 6–23)
CO2: 31 mEq/L (ref 19–32)
Calcium: 9.5 mg/dL (ref 8.4–10.5)
Chloride: 103 mEq/L (ref 96–112)
Creatinine, Ser: 0.85 mg/dL (ref 0.40–1.20)
GFR: 79.13 mL/min (ref >60.00)
Glucose, Bld: 89 mg/dL (ref 70–99)
Potassium: 4.2 mEq/L (ref 3.5–5.1)
Sodium: 140 mEq/L (ref 135–145)
Total Bilirubin: 0.7 mg/dL (ref 0.2–1.2)
Total Protein: 6.9 g/dL (ref 6.0–8.3)

## 2020-09-05 LAB — CBC
HCT: 42.3 % (ref 36.0–46.0)
Hemoglobin: 13.9 g/dL (ref 12.0–15.0)
MCHC: 32.9 g/dL (ref 30.0–36.0)
MCV: 90.1 fl (ref 78.0–100.0)
Platelets: 238 10*3/uL (ref 150.0–400.0)
RBC: 4.7 Mil/uL (ref 3.87–5.11)
RDW: 13.4 % (ref 11.5–15.5)
WBC: 5.5 10*3/uL (ref 4.0–10.5)

## 2020-09-05 LAB — LIPID PANEL
Cholesterol: 201 mg/dL — ABNORMAL HIGH (ref 0–200)
HDL: 61.1 mg/dL (ref >39.00)
LDL Cholesterol: 127 mg/dL — ABNORMAL HIGH (ref 0–99)
NonHDL: 139.95
Total CHOL/HDL Ratio: 3
Triglycerides: 66 mg/dL (ref 0.0–149.0)
VLDL: 13.2 mg/dL (ref 0.0–40.0)

## 2020-09-05 LAB — VITAMIN D 25 HYDROXY (VIT D DEFICIENCY, FRACTURES): VITD: 32.73 ng/mL (ref 30.00–100.00)

## 2020-09-05 LAB — TSH: TSH: 1.33 u[IU]/mL (ref 0.35–4.50)

## 2020-09-05 NOTE — Telephone Encounter (Signed)
Pt contacted informed of results. Pt concerned with Vit D levels wants to know should she begin a vit d supplement

## 2020-09-05 NOTE — Telephone Encounter (Signed)
-----   Message from Mosie Lukes, MD sent at 09/05/2020 12:31 PM EDT ----- Labs good so far. Some still pending

## 2020-09-05 NOTE — Telephone Encounter (Signed)
Pt will need to be called and rescheduled to repeat labs

## 2020-09-05 NOTE — Progress Notes (Signed)
Pt called informed of provider result note has concerns with ViT d and possibly starting a supplement

## 2020-09-05 NOTE — Telephone Encounter (Signed)
Yes have her start Vitamin D 2000 IU daily

## 2020-09-05 NOTE — Telephone Encounter (Signed)
Patient states she would like to speak with you in regards to vitamin D. (pt would not specify)

## 2020-09-05 NOTE — Telephone Encounter (Signed)
I called [facility] and clarified what was going on. They will send it back to get a label on it. I put the label up front for the courtier to take it back after it is labeled.

## 2020-09-05 NOTE — Telephone Encounter (Signed)
Pt called informed to begin daily supplement vit D

## 2020-09-05 NOTE — Telephone Encounter (Signed)
Caller name: [name]) Call back number: [phone]  States they received a specimen with no name on it, they will return it back to [place].

## 2020-09-07 LAB — HIV ANTIBODY (ROUTINE TESTING W REFLEX): HIV 1&2 Ab, 4th Generation: NONREACTIVE

## 2020-09-09 ENCOUNTER — Telehealth: Payer: Self-pay

## 2020-09-09 LAB — MOLECULAR ANCILLARY ONLY
Bacterial Vaginitis (gardnerella): NEGATIVE
Candida Glabrata: NEGATIVE
Candida Vaginitis: NEGATIVE
Chlamydia: NEGATIVE
Comment: NEGATIVE
Comment: NEGATIVE
Comment: NEGATIVE
Comment: NEGATIVE
Comment: NEGATIVE
Comment: NORMAL
Neisseria Gonorrhea: NEGATIVE
Trichomonas: NEGATIVE

## 2020-09-09 NOTE — Telephone Encounter (Signed)
Courtier came and label was placed on specimen.

## 2020-11-03 ENCOUNTER — Encounter: Payer: 59 | Admitting: Obstetrics & Gynecology

## 2020-12-10 ENCOUNTER — Ambulatory Visit (INDEPENDENT_AMBULATORY_CARE_PROVIDER_SITE_OTHER): Payer: 59 | Admitting: Internal Medicine

## 2020-12-10 ENCOUNTER — Encounter: Payer: Self-pay | Admitting: Internal Medicine

## 2020-12-10 ENCOUNTER — Other Ambulatory Visit: Payer: Self-pay

## 2020-12-10 ENCOUNTER — Other Ambulatory Visit: Payer: Self-pay | Admitting: Obstetrics & Gynecology

## 2020-12-10 VITALS — BP 92/62 | HR 63 | Temp 98.0°F | Resp 16 | Ht 65.0 in | Wt 131.1 lb

## 2020-12-10 DIAGNOSIS — N390 Urinary tract infection, site not specified: Secondary | ICD-10-CM | POA: Diagnosis not present

## 2020-12-10 DIAGNOSIS — N3010 Interstitial cystitis (chronic) without hematuria: Secondary | ICD-10-CM | POA: Diagnosis not present

## 2020-12-10 DIAGNOSIS — Z1231 Encounter for screening mammogram for malignant neoplasm of breast: Secondary | ICD-10-CM

## 2020-12-10 LAB — URINALYSIS, ROUTINE W REFLEX MICROSCOPIC
Bilirubin Urine: NEGATIVE
Hgb urine dipstick: NEGATIVE
Ketones, ur: NEGATIVE
Leukocytes,Ua: NEGATIVE
Nitrite: NEGATIVE
Specific Gravity, Urine: 1.025 (ref 1.000–1.030)
Total Protein, Urine: NEGATIVE
Urine Glucose: NEGATIVE
Urobilinogen, UA: 0.2 (ref 0.0–1.0)
pH: 6 (ref 5.0–8.0)

## 2020-12-10 LAB — POC URINALSYSI DIPSTICK (AUTOMATED)
Bilirubin, UA: NEGATIVE
Blood, UA: NEGATIVE
Glucose, UA: NEGATIVE
Ketones, UA: NEGATIVE
Leukocytes, UA: NEGATIVE
Nitrite, UA: NEGATIVE
Protein, UA: POSITIVE — AB
Spec Grav, UA: 1.025 (ref 1.010–1.025)
Urobilinogen, UA: 0.2 E.U./dL
pH, UA: 6 (ref 5.0–8.0)

## 2020-12-10 MED ORDER — FLUCONAZOLE 150 MG PO TABS: 150.0000 mg | ORAL_TABLET | Freq: Every day | ORAL | 0 refills | Status: DC

## 2020-12-10 NOTE — Patient Instructions (Signed)
Drink plenty fluids  Please use over-the-counter Monistat for few days  If the urine culture shows a infection will call you and recommend antibiotics.  Call if you have severe symptoms

## 2020-12-10 NOTE — Progress Notes (Signed)
Pre visit review using our clinic review tool, if applicable. No additional management support is needed unless otherwise documented below in the visit note. 

## 2020-12-10 NOTE — Progress Notes (Signed)
Subjective:    Patient ID: [name], female    DOB: 04-27-1971, 50 y.o.   MRN: [id number]  DOS:  12/10/2020 Type of visit - description: Acute Symptoms a started 2 days ago, reports post urination discomfort  "like inside the bladder". It resemble both a UTI but also a interstitial cystitis exacerbation.  No fever chills No nausea vomiting No flank pain. No urinary frequency, urgency or gross hematuria. She is menopausal, denies vaginal discharge or bleeding.  Mild itching?.     Review of Systems See above   Past Medical History:  Diagnosis Date  . Allergy   . Alopecia   . Anxiety and depression 07/31/2011  . Chest pain 09/22/10   Sharp pains in left breast & sometimes in the right breast as well. Random in occurence & nonexertional. Also has occasional SOB & DOE when going up stairs & occasionally when working out. Nuclear Stress Treadmill 09/29/10 -   . HSV-2 (herpes simplex virus 2) infection   . IBS (irritable bowel syndrome)   . Interstitial cystitis   . Multiple allergies 07/02/2011  . Neuromuscular disorder ([place])    past hx dx of Fibromyalgia  . Otitis media 03/16/2012  . Perimenopausal 07/22/2013  . PONV (postoperative nausea and vomiting)   . Poor concentration 07/02/2011  . Shoulder pain, left 04/11/2012  . SOB (shortness of breath) 09/22/10   ECHO 09/29/10 - NL LVF, trivial PE, trivial TR/PR.  Marland Kitchen UTI (lower urinary tract infection) 08/28/2011    Past Surgical History:  Procedure Laterality Date  . BREAST BIOPSY Right 03/12/2016   Procedure: breast exploration;  Surgeon: Jackolyn Confer, MD;  Location: [facility];  Service: General;  Laterality: Right;  . BREAST ENHANCEMENT SURGERY  2007  . CESAREAN SECTION  2001 and 2004   x2  . COLONOSCOPY  2007  . LAPAROSCOPY     twice  . RHINOPLASTY  2003  . TONSILLECTOMY  1982  . TUBAL LIGATION    . UPPER GASTROINTESTINAL ENDOSCOPY  2011    Allergies as of 12/10/2020   No Known  Allergies     Medication List       Accurate as of December 10, 2020 11:59 PM. If you have any questions, ask your nurse or doctor.        STOP taking these medications   nitrofurantoin (macrocrystal-monohydrate) 100 MG capsule Commonly known as: MACROBID Stopped by: Kathlene November, MD     TAKE these medications   amphetamine-dextroamphetamine 20 MG tablet Commonly known as: ADDERALL Take 1 tablet (20 mg total) by mouth 2 (two) times daily. October 2020 What changed: additional instructions   buPROPion 150 MG 24 hr tablet Commonly known as: WELLBUTRIN XL Take 2 tablets (300 mg total) by mouth daily.   Cholecalciferol 50 MCG (2000 UT) Tabs Take 1 tablet by mouth daily.   citalopram 10 MG tablet Commonly known as: CELEXA TAKE 1 TABLET BY MOUTH EVERY DAY   estradiol 0.5 MG tablet Commonly known as: ESTRACE Take 2 tablets (1 mg total) by mouth daily. What changed: how much to take   fexofenadine 180 MG tablet Commonly known as: ALLEGRA Take 180 mg by mouth daily.   finasteride 5 MG tablet Commonly known as: PROSCAR TAKE 1 TABLET BY MOUTH EVERY DAY   fluconazole 150 MG tablet Commonly known as: DIFLUCAN Take 1 tablet (150 mg total) by mouth daily. Started by: Kathlene November, MD   flunisolide 25 MCG/ACT (0.025%) Soln Commonly known as: NASALIDE PLEASE  SPECIFY DIRECTIONS, REFILLS AND QUANTITY   hydrOXYzine 10 MG tablet Commonly known as: ATARAX/VISTARIL Take 10 mg by mouth daily.   OVER THE COUNTER MEDICATION FOLEXIN with biotin 2  caps BID   SM NATURAL LAXATIVE/STOOL SOFT PO Take 1 tablet by mouth.   spironolactone 100 MG tablet Commonly known as: ALDACTONE Take 100 mg by mouth daily.   tretinoin 0.025 % cream Commonly known as: RETIN-A   valACYclovir 500 MG tablet Commonly known as: VALTREX Take 1 tablet (500 mg total) by mouth daily.          Objective:   Physical Exam BP 92/62 (BP Location: Left Arm, Patient Position: Sitting, Cuff Size: Small)    Pulse 63   Temp 98 F (36.7 C) (Oral)   Resp 16   Ht 5\' 5"  (1.651 m)   Wt 131 lb 2 oz (59.5 kg)   LMP 07/03/2015   SpO2 100%   BMI 21.82 kg/m  General:   Well developed, NAD, BMI noted.  HEENT:  Normocephalic . Face symmetric, atraumatic Lungs:  CTA B Normal respiratory effort, no intercostal retractions, no accessory muscle use. Heart: RRR,  no murmur.  Abdomen:  Not distended, soft, non-tender. No rebound or rigidity.   Skin: Not pale. Not jaundice Lower extremities: no pretibial edema bilaterally  Neurologic:  alert & oriented X3.  Speech normal, gait appropriate for age and unassisted Psych--  Cognition and judgment appear intact.  Cooperative with normal attention span and concentration.  Behavior appropriate. No anxious or depressed appearing.     Assessment      50 year old female, PMH includes PACs, IBS, GERD, interstitial cystitis, ADHD, vitamin D deficiency, anxiety depression, hyperlipidemia, presents with the following issues:   UTI? Symptoms as described above, Udip trace protein otherwise negative. Plan: Wait for urine culture results, push fluids, call if symptoms severe. She suspects possibly mild yeast infection, recommend OTC Monistat.  (Request Diflucan, sent).  This visit occurred during the SARS-CoV-2 public health emergency.  Safety protocols were in place, including screening questions prior to the visit, additional usage of staff PPE, and extensive cleaning of exam room while observing appropriate contact time as indicated for disinfecting solutions.

## 2020-12-11 ENCOUNTER — Encounter: Payer: 59 | Admitting: Obstetrics & Gynecology

## 2020-12-11 LAB — URINE CULTURE
MICRO NUMBER:: 11514144
Result:: NO GROWTH
SPECIMEN QUALITY:: ADEQUATE

## 2020-12-22 ENCOUNTER — Telehealth (INDEPENDENT_AMBULATORY_CARE_PROVIDER_SITE_OTHER): Payer: 59 | Admitting: Family Medicine

## 2020-12-22 ENCOUNTER — Other Ambulatory Visit: Payer: Self-pay

## 2020-12-22 ENCOUNTER — Other Ambulatory Visit: Payer: Self-pay | Admitting: Family Medicine

## 2020-12-22 DIAGNOSIS — R3911 Hesitancy of micturition: Secondary | ICD-10-CM

## 2020-12-22 DIAGNOSIS — R059 Cough, unspecified: Secondary | ICD-10-CM

## 2020-12-22 DIAGNOSIS — F419 Anxiety disorder, unspecified: Secondary | ICD-10-CM | POA: Diagnosis not present

## 2020-12-22 DIAGNOSIS — E559 Vitamin D deficiency, unspecified: Secondary | ICD-10-CM

## 2020-12-22 DIAGNOSIS — F32A Depression, unspecified: Secondary | ICD-10-CM

## 2020-12-22 DIAGNOSIS — R053 Chronic cough: Secondary | ICD-10-CM | POA: Insufficient documentation

## 2020-12-22 MED ORDER — FAMOTIDINE 40 MG PO TABS: 40.0000 mg | ORAL_TABLET | Freq: Every day | ORAL | 1 refills | Status: DC

## 2020-12-22 MED ORDER — BUPROPION HCL ER (XL) 150 MG PO TB24: 300.0000 mg | ORAL_TABLET | Freq: Every day | ORAL | 0 refills | Status: DC

## 2020-12-22 MED ORDER — CETIRIZINE HCL 10 MG PO TABS: 10.0000 mg | ORAL_TABLET | Freq: Two times a day (BID) | ORAL | 2 refills | Status: DC | PRN

## 2020-12-22 MED ORDER — CITALOPRAM HYDROBROMIDE 10 MG PO TABS
5.0000 mg | ORAL_TABLET | Freq: Every day | ORAL | 1 refills | Status: DC
Start: 2020-12-22 — End: 2021-02-19

## 2020-12-22 NOTE — Assessment & Plan Note (Signed)
Is a mild, dry cough but occurs with some regularity after exertion and at night. Will try Zyrtec once to twice daily and Famotidine 40 mg at bedtime for the next month and proceed with a CXR if no improvement patient will let us know and we will refer to ENT for further evaluation.

## 2020-12-22 NOTE — Progress Notes (Signed)
Virtual Visit via Video Note  I connected with [name] on 12/22/20 at  2:40 PM EST by a video enabled telemedicine application and verified that I am speaking with the correct person using two identifiers.  Location: Patient: home, patient and provider in visit Provider: office   I discussed the limitations of evaluation and management by telemedicine and the availability of in person appointments. The patient expressed understanding and agreed to proceed. S Chism, CMA was able to get the patient set up on a video visit    Subjective:    Patient ID: [name], female    DOB: 02-27-1971, 50 y.o.   MRN: [id number]  Chief Complaint  Patient presents with  . Cough  . Nasal Congestion    HPI Patient is in today for follow up on chronic medical concerns. No recent febrile illness or hospitalizations. She is interested in decreasing  And stopping her anti depressants as her life is going well and she does not feel she needs them any longer. She is noting a cough and congestion recently as well. Has a dry cough with exertion and when lying down. Denies CP/palp/SOB/HA/congestion/fevers/GI or GU c/o. Taking meds as prescribed  Past Medical History:  Diagnosis Date  . Allergy   . Alopecia   . Anxiety and depression 07/31/2011  . Chest pain 09/22/10   Sharp pains in left breast & sometimes in the right breast as well. Random in occurence & nonexertional. Also has occasional SOB & DOE when going up stairs & occasionally when working out. Nuclear Stress Treadmill 09/29/10 -   . HSV-2 (herpes simplex virus 2) infection   . IBS (irritable bowel syndrome)   . Interstitial cystitis   . Multiple allergies 07/02/2011  . Neuromuscular disorder ([place])    past hx dx of Fibromyalgia  . Otitis media 03/16/2012  . Perimenopausal 07/22/2013  . PONV (postoperative nausea and vomiting)   . Poor concentration 07/02/2011  . Shoulder pain, left 04/11/2012  . SOB (shortness of breath)  09/22/10   ECHO 09/29/10 - NL LVF, trivial PE, trivial TR/PR.  Marland Kitchen UTI (lower urinary tract infection) 08/28/2011    Past Surgical History:  Procedure Laterality Date  . BREAST BIOPSY Right 03/12/2016   Procedure: breast exploration;  Surgeon: Jackolyn Confer, MD;  Location: [facility];  Service: General;  Laterality: Right;  . BREAST ENHANCEMENT SURGERY  2007  . CESAREAN SECTION  2001 and 2004   x2  . COLONOSCOPY  2007  . LAPAROSCOPY     twice  . RHINOPLASTY  2003  . TONSILLECTOMY  1982  . TUBAL LIGATION    . UPPER GASTROINTESTINAL ENDOSCOPY  2011    Family History  Problem Relation Age of Onset  . Heart disease Mother        MVP  . Anxiety disorder Mother   . Mitral valve prolapse Mother   . Alzheimer's disease Mother   . Colon polyps Mother   . Hypertension Brother   . Obesity Brother   . Diabetes Brother        DM, obese  . Allergies Daughter   . Allergies Son   . Kidney disease Maternal Grandmother        uremia  . Breast cancer Neg Hx   . Colon cancer Neg Hx   . Esophageal cancer Neg Hx   . Rectal cancer Neg Hx   . Stomach cancer Neg Hx     Social History   Socioeconomic History  .  Marital status: Divorced    Spouse name: Not on file  . Number of children: Not on file  . Years of education: Not on file  . Highest education level: Not on file  Occupational History  . Not on file  Tobacco Use  . Smoking status: Never Smoker  . Smokeless tobacco: Never Used  Vaping Use  . Vaping Use: Never used  Substance and Sexual Activity  . Alcohol use: Yes    Alcohol/week: 0.0 standard drinks    Comment: RARELY  . Drug use: No  . Sexual activity: Not Currently    Birth control/protection: Surgical    Comment: Tubal lig-1st intercourse 50 yo-More than 5 partners  Other Topics Concern  . Not on file  Social History Narrative  . Not on file   Social Determinants of Health   Financial Resource Strain: Not on file  Food Insecurity: Not on file   Transportation Needs: Not on file  Physical Activity: Not on file  Stress: Not on file  Social Connections: Not on file  Intimate Partner Violence: Not on file    Outpatient Medications Prior to Visit  Medication Sig Dispense Refill  . amphetamine-dextroamphetamine (ADDERALL) 20 MG tablet Take 1 tablet (20 mg total) by mouth 2 (two) times daily. October 2020 (Patient taking differently: Take 20 mg by mouth 2 (two) times daily. October 2020 PRN only) 60 tablet 0  . Cholecalciferol 50 MCG (2000 UT) TABS Take 1 tablet by mouth daily.    Marland Kitchen estradiol (ESTRACE) 0.5 MG tablet Take 2 tablets (1 mg total) by mouth daily. (Patient taking differently: Take 0.5 mg by mouth daily.) 180 tablet 4  . fexofenadine (ALLEGRA) 180 MG tablet Take 180 mg by mouth daily.      . finasteride (PROSCAR) 5 MG tablet TAKE 1 TABLET BY MOUTH EVERY DAY 90 tablet 0  . flunisolide (NASALIDE) 25 MCG/ACT (0.025%) SOLN PLEASE SPECIFY DIRECTIONS, REFILLS AND QUANTITY 75 mL 11  . hydrOXYzine (ATARAX/VISTARIL) 10 MG tablet Take 10 mg by mouth daily.    Marland Kitchen OVER THE COUNTER MEDICATION FOLEXIN with biotin 2  caps BID    . Sennosides-Docusate Sodium (SM NATURAL LAXATIVE/STOOL SOFT PO) Take 1 tablet by mouth.    . spironolactone (ALDACTONE) 100 MG tablet Take 100 mg by mouth daily.    Marland Kitchen tretinoin (RETIN-A) 0.025 % cream     . valACYclovir (VALTREX) 500 MG tablet Take 1 tablet (500 mg total) by mouth daily. 90 tablet 3  . buPROPion (WELLBUTRIN XL) 150 MG 24 hr tablet Take 2 tablets (300 mg total) by mouth daily. 180 tablet 0  . citalopram (CELEXA) 10 MG tablet TAKE 1 TABLET BY MOUTH EVERY DAY 90 tablet 1  . fluconazole (DIFLUCAN) 150 MG tablet Take 1 tablet (150 mg total) by mouth daily. 2 tablet 0   No facility-administered medications prior to visit.    No Known Allergies  Review of Systems  Constitutional: Negative for fever and malaise/fatigue.  HENT: Positive for congestion.   Eyes: Negative for blurred vision.   Respiratory: Positive for cough. Negative for shortness of breath.   Cardiovascular: Negative for chest pain, palpitations and leg swelling.  Gastrointestinal: Negative for abdominal pain, blood in stool and nausea.  Genitourinary: Negative for dysuria and frequency.  Musculoskeletal: Negative for falls.  Skin: Negative for rash.  Neurological: Negative for dizziness, loss of consciousness and headaches.  Endo/Heme/Allergies: Negative for environmental allergies.  Psychiatric/Behavioral: Negative for depression. The patient is not nervous/anxious.  Objective:    Physical Exam Constitutional:      Appearance: Normal appearance. She is not ill-appearing.  HENT:     Head: Normocephalic and atraumatic.     Right Ear: External ear normal.     Left Ear: External ear normal.     Nose: Nose normal.  Eyes:     General:        Right eye: No discharge.        Left eye: No discharge.  Pulmonary:     Effort: Pulmonary effort is normal.  Neurological:     Mental Status: She is alert and oriented to person, place, and time.  Psychiatric:        Behavior: Behavior normal.     LMP 07/03/2015  Wt Readings from Last 3 Encounters:  12/10/20 131 lb 2 oz (59.5 kg)  09/04/20 127 lb 3.2 oz (57.7 kg)  06/05/20 125 lb (56.7 kg)    Diabetic Foot Exam - Simple   No data filed    Lab Results  Component Value Date   WBC 5.5 09/05/2020   HGB 13.9 09/05/2020   HCT 42.3 09/05/2020   PLT 238.0 09/05/2020   GLUCOSE 89 09/05/2020   CHOL 201 (H) 09/05/2020   TRIG 66.0 09/05/2020   HDL 61.10 09/05/2020   LDLCALC 127 (H) 09/05/2020   ALT 16 09/05/2020   AST 17 09/05/2020   NA 140 09/05/2020   K 4.2 09/05/2020   CL 103 09/05/2020   CREATININE 0.85 09/05/2020   BUN 22 09/05/2020   CO2 31 09/05/2020   TSH 1.33 09/05/2020    Lab Results  Component Value Date   TSH 1.33 09/05/2020   Lab Results  Component Value Date   WBC 5.5 09/05/2020   HGB 13.9 09/05/2020   HCT 42.3  09/05/2020   MCV 90.1 09/05/2020   PLT 238.0 09/05/2020   Lab Results  Component Value Date   NA 140 09/05/2020   K 4.2 09/05/2020   CO2 31 09/05/2020   GLUCOSE 89 09/05/2020   BUN 22 09/05/2020   CREATININE 0.85 09/05/2020   BILITOT 0.7 09/05/2020   ALKPHOS 38 (L) 09/05/2020   AST 17 09/05/2020   ALT 16 09/05/2020   PROT 6.9 09/05/2020   ALBUMIN 4.7 09/05/2020   CALCIUM 9.5 09/05/2020   GFR 79.13 09/05/2020   Lab Results  Component Value Date   CHOL 201 (H) 09/05/2020   Lab Results  Component Value Date   HDL 61.10 09/05/2020   Lab Results  Component Value Date   LDLCALC 127 (H) 09/05/2020   Lab Results  Component Value Date   TRIG 66.0 09/05/2020   Lab Results  Component Value Date   CHOLHDL 3 09/05/2020   No results found for: HGBA1C     Assessment & Plan:   Problem List Items Addressed This Visit    Vitamin D deficiency    Supplement and monitor      Anxiety and depression    She is doing well and is ready to try and titrate off her Celexa and her Wellbutrin. Will have her drop her Celexa from 10 mg to 5 mg daily x 30 days then 5 mg every other day for 2 weeks and then stop. In 30 days will drop the Wellbutrin XR 150 mg to every other day for one month and then stop. She will report if she has any concerns as she proceeds.       Relevant Medications   citalopram (CELEXA)  10 MG tablet   buPROPion (WELLBUTRIN XL) 150 MG 24 hr tablet   Urinary hesitancy    Urinary symptoms are improved      Cough - Primary    Is a mild, dry cough but occurs with some regularity after exertion and at night. Will try Zyrtec once to twice daily and Famotidine 40 mg at bedtime for the next month and proceed with a CXR if no improvement patient will let us know and we will refer to ENT for further evaluation.      Relevant Orders   DG Chest 2 View      I have changed Magen J. Bejarano "Blanca"'s citalopram. I am also having her start on famotidine and cetirizine.  Additionally, I am having her maintain her fexofenadine, tretinoin, Cholecalciferol, spironolactone, amphetamine-dextroamphetamine, estradiol, hydrOXYzine, OVER THE COUNTER MEDICATION, Sennosides-Docusate Sodium (SM NATURAL LAXATIVE/STOOL SOFT PO), flunisolide, valACYclovir, fluconazole, finasteride, and buPROPion.  Meds ordered this encounter  Medications  . citalopram (CELEXA) 10 MG tablet    Sig: Take 0.5 tablets (5 mg total) by mouth daily.    Dispense:  90 tablet    Refill:  1  . buPROPion (WELLBUTRIN XL) 150 MG 24 hr tablet    Sig: Take 2 tablets (300 mg total) by mouth daily.    Dispense:  180 tablet    Refill:  0  . famotidine (PEPCID) 40 MG tablet    Sig: Take 1 tablet (40 mg total) by mouth at bedtime.    Dispense:  30 tablet    Refill:  1  . cetirizine (ZYRTEC) 10 MG tablet    Sig: Take 1 tablet (10 mg total) by mouth 2 (two) times daily as needed for allergies.    Dispense:  60 tablet    Refill:  2    I discussed the assessment and treatment plan with the patient. The patient was provided an opportunity to ask questions and all were answered. The patient agreed with the plan and demonstrated an understanding of the instructions.   The patient was advised to call back or seek an in-person evaluation if the symptoms worsen or if the condition fails to improve as anticipated.  I provided 22 minutes of non-face-to-face time during this encounter.   Penni Homans, MD

## 2020-12-22 NOTE — Assessment & Plan Note (Signed)
Urinary symptoms are improved

## 2020-12-22 NOTE — Assessment & Plan Note (Signed)
She is doing well and is ready to try and titrate off her Celexa and her Wellbutrin. Will have her drop her Celexa from 10 mg to 5 mg daily x 30 days then 5 mg every other day for 2 weeks and then stop. In 30 days will drop the Wellbutrin XR 150 mg to every other day for one month and then stop. She will report if she has any concerns as she proceeds.

## 2020-12-22 NOTE — Patient Instructions (Addendum)
Will have her drop her Celexa from 10 mg to 5 mg daily x 30 days then 5 mg every other day for 2 weeks and then stop. In 30 days will drop the Wellbutrin XR 150 mg to every other day for one month and then stop.  Will try Zyrtec once to twice daily and Famotidine 40 mg at bedtime for the next month and proceed with a CXR if no improvement patient will let us know and we will refer to ENT for further evaluation.

## 2020-12-22 NOTE — Assessment & Plan Note (Signed)
Supplement and monitor 

## 2020-12-29 ENCOUNTER — Encounter: Payer: 59 | Admitting: Obstetrics and Gynecology

## 2021-01-05 ENCOUNTER — Other Ambulatory Visit: Payer: Self-pay

## 2021-01-05 ENCOUNTER — Ambulatory Visit (INDEPENDENT_AMBULATORY_CARE_PROVIDER_SITE_OTHER)
Admission: RE | Admit: 2021-01-05 | Discharge: 2021-01-05 | Disposition: A | Discharge status: Home / Self Care | Payer: 59 | Source: Ambulatory Visit | Attending: Family Medicine | Admitting: Family Medicine

## 2021-01-05 DIAGNOSIS — R059 Cough, unspecified: Secondary | ICD-10-CM

## 2021-01-13 ENCOUNTER — Other Ambulatory Visit: Payer: Self-pay | Admitting: Family Medicine

## 2021-02-02 ENCOUNTER — Ambulatory Visit: Payer: 59 | Admitting: Obstetrics & Gynecology

## 2021-02-10 ENCOUNTER — Other Ambulatory Visit: Payer: Self-pay | Admitting: Family Medicine

## 2021-02-19 ENCOUNTER — Other Ambulatory Visit: Payer: Self-pay

## 2021-02-19 ENCOUNTER — Ambulatory Visit: Payer: 59 | Admitting: Nurse Practitioner

## 2021-02-19 ENCOUNTER — Encounter: Payer: Self-pay | Admitting: Nurse Practitioner

## 2021-02-19 VITALS — BP 92/62 | HR 72 | Temp 98.3°F | Resp 14 | Wt 131.0 lb

## 2021-02-19 DIAGNOSIS — R102 Pelvic and perineal pain: Secondary | ICD-10-CM

## 2021-02-19 LAB — URINALYSIS
Bilirubin Urine: NEGATIVE
Glucose, UA: NEGATIVE
Hgb urine dipstick: NEGATIVE
Ketones, ur: NEGATIVE
Leukocytes,Ua: NEGATIVE
Nitrite: NEGATIVE
Protein, ur: NEGATIVE
Specific Gravity, Urine: 1.025 (ref 1.001–1.03)
pH: 6 (ref 5.0–8.0)

## 2021-02-19 NOTE — Patient Instructions (Addendum)
Menopause Manifesto by Jeanine Luz.   Pelvic Pain, Female Pelvic pain is pain in your lower abdomen, below your belly button and between your hips. The pain may start suddenly (be acute), keep coming back (be recurring), or last a long time (become chronic). Pelvic pain that lasts longer than 6 months is considered chronic. Pelvic pain may affect your:  Reproductive organs.  Urinary system.  Digestive tract.  Musculoskeletal system. There are many potential causes of pelvic pain. Sometimes, the pain can be a result of digestive or urinary conditions, strained muscles or ligaments, or reproductive conditions. Sometimes the cause of pelvic pain is not known. Follow these instructions at home:  Take over-the-counter and prescription medicines only as told by your health care provider.  Rest as told by your health care provider.  Do not have sex if it hurts.  Keep a journal of your pelvic pain. Write down: ? When the pain started. ? Where the pain is located. ? What seems to make the pain better or worse, such as food or your period (menstrual cycle). ? Any symptoms you have along with the pain.  Keep all follow-up visits as told by your health care provider. This is important.   Contact a health care provider if:  Medicine does not help your pain.  Your pain comes back.  You have new symptoms.  You have abnormal vaginal discharge or bleeding, including bleeding after menopause.  You have a fever or chills.  You are constipated.  You have blood in your urine or stool.  You have foul-smelling urine.  You feel weak or light-headed. Get help right away if:  You have sudden severe pain.  Your pain gets steadily worse.  You have severe pain along with fever, nausea, vomiting, or excessive sweating.  You lose consciousness. Summary  Pelvic pain is pain in your lower abdomen, below your belly button and between your hips.  There are many potential causes of pelvic  pain.  Keep a journal of your pelvic pain. This information is not intended to replace advice given to you by your health care provider. Make sure you discuss any questions you have with your health care provider. Document Revised: 04/05/2018 Document Reviewed: 04/05/2018 Elsevier Patient Education  [place].

## 2021-02-19 NOTE — Addendum Note (Signed)
Addended by: Susy Manor on: 02/19/2021 12:27 PM   Modules accepted: Orders

## 2021-02-19 NOTE — Progress Notes (Addendum)
GYNECOLOGY  VISIT  CC:   Pelvic pain  HPI: 50 y.o. [id number] Divorced White or Caucasian female here for abdominal discomfort.   Pelvic pain x 2 weeks, comes and goes, pain intensity is "3-4" on scale of 1-10. Has felt it about 5 times today. When coughs, feels the entire lower pelvic area. Denies pain with urination. Reports does not feel like pain is urinary. No change in bowel movements, denies constipation. Last BM this am (small amount) Denies vaginal irritation. Sexually active with one partner, no concerns with infidelity. No pain with Sex. Can not think of any other associations. Denies nausea in the past 2 weeks but has had in past year, hasn't had fever, does not hurt to walk. Feels like when she had ovarian cysts prior to menopause (Menopause at age 59). Has concern of ovarian cancer.  Pt works as a Environmental education officer for [employer]  GYNECOLOGIC HISTORY: Patient's last menstrual period was 07/03/2015. Contraception: BTL Menopausal hormone therapy: estradiol & prometrium  Patient Active Problem List   Diagnosis Date Noted  . Cough 12/22/2020  . Vaginitis 09/04/2020  . HSV infection 09/04/2020  . Preventative health care 09/04/2020  . Urinary hesitancy 06/05/2020  . Pelvic floor dysfunction 06/05/2020  . Nausea 03/02/2020  . Alopecia 06/27/2019  . Hyperlipidemia 12/27/2018  . Neck pain on right side 12/02/2014  . Fatigue 01/27/2014  . Perimenopausal 07/22/2013  . Otitis media 03/16/2012  . Palpitations 02/14/2012  . Anxiety and depression 07/31/2011  . Vitamin D deficiency 07/02/2011  . Multiple allergies 07/02/2011  . Interstitial cystitis   . IBS (irritable bowel syndrome)   . GERD (gastroesophageal reflux disease)   . Premature atrial contractions   . Adult ADHD     Past Medical History:  Diagnosis Date  . Allergy   . Alopecia   . Anxiety and depression 07/31/2011  . Chest pain 09/22/10   Sharp pains in left breast & sometimes in the right breast as well. Random  in occurence & nonexertional. Also has occasional SOB & DOE when going up stairs & occasionally when working out. Nuclear Stress Treadmill 09/29/10 -   . HSV-2 (herpes simplex virus 2) infection   . IBS (irritable bowel syndrome)   . Interstitial cystitis   . Multiple allergies 07/02/2011  . Neuromuscular disorder ([place])    past hx dx of Fibromyalgia  . Otitis media 03/16/2012  . Perimenopausal 07/22/2013  . PONV (postoperative nausea and vomiting)   . Poor concentration 07/02/2011  . Shoulder pain, left 04/11/2012  . SOB (shortness of breath) 09/22/10   ECHO 09/29/10 - NL LVF, trivial PE, trivial TR/PR.  Marland Kitchen UTI (lower urinary tract infection) 08/28/2011    Past Surgical History:  Procedure Laterality Date  . BREAST BIOPSY Right 03/12/2016   Procedure: breast exploration;  Surgeon: Jackolyn Confer, MD;  Location: [facility];  Service: General;  Laterality: Right;  . BREAST ENHANCEMENT SURGERY  2007  . CESAREAN SECTION  2001 and 2004   x2  . COLONOSCOPY  2007  . LAPAROSCOPY     twice  . RHINOPLASTY  2003  . TONSILLECTOMY  1982  . TUBAL LIGATION    . UPPER GASTROINTESTINAL ENDOSCOPY  2011    MEDS:   Current Outpatient Medications on File Prior to Visit  Medication Sig Dispense Refill  . amphetamine-dextroamphetamine (ADDERALL) 20 MG tablet Take 1 tablet (20 mg total) by mouth 2 (two) times daily. October 2020 (Patient taking differently: Take 20 mg by mouth 2 (two)  times daily. October 2020 PRN only) 60 tablet 0  . cetirizine (ZYRTEC) 10 MG tablet Take 1 tablet (10 mg total) by mouth 2 (two) times daily as needed for allergies. 60 tablet 2  . Cholecalciferol 50 MCG (2000 UT) TABS Take 1 tablet by mouth daily.    Marland Kitchen estradiol (ESTRACE) 0.5 MG tablet Take 2 tablets (1 mg total) by mouth daily. (Patient taking differently: Take 0.5 mg by mouth daily.) 180 tablet 4  . famotidine (PEPCID) 40 MG tablet TAKE 1 TABLET BY MOUTH EVERYDAY AT BEDTIME 30 tablet 1  . fexofenadine  (ALLEGRA) 180 MG tablet Take 180 mg by mouth daily.      . finasteride (PROSCAR) 5 MG tablet TAKE 1 TABLET BY MOUTH EVERY DAY 90 tablet 0  . flunisolide (NASALIDE) 25 MCG/ACT (0.025%) SOLN PLEASE SPECIFY DIRECTIONS, REFILLS AND QUANTITY 75 mL 11  . progesterone (PROMETRIUM) 100 MG capsule Take 100 mg by mouth at bedtime.    Orlie Dakin Sodium (SM NATURAL LAXATIVE/STOOL SOFT PO) Take 1 tablet by mouth.    . spironolactone (ALDACTONE) 100 MG tablet Take 100 mg by mouth daily.    Marland Kitchen tretinoin (RETIN-A) 0.025 % cream     . tretinoin (RETIN-A) 0.05 % cream Apply topically.    Marland Kitchen UNABLE TO FIND cocovia    . valACYclovir (VALTREX) 500 MG tablet Take 1 tablet (500 mg total) by mouth daily. 90 tablet 3  . XIIDRA 5 % SOLN      No current facility-administered medications on file prior to visit.    ALLERGIES: Patient has no known allergies.  Family History  Problem Relation Age of Onset  . Heart disease Mother        MVP  . Anxiety disorder Mother   . Mitral valve prolapse Mother   . Alzheimer's disease Mother   . Colon polyps Mother   . Hypertension Brother   . Obesity Brother   . Diabetes Brother        DM, obese  . Allergies Daughter   . Allergies Son   . Kidney disease Maternal Grandmother        uremia  . Breast cancer Neg Hx   . Colon cancer Neg Hx   . Esophageal cancer Neg Hx   . Rectal cancer Neg Hx   . Stomach cancer Neg Hx      Review of Systems  Constitutional: Negative.   HENT: Negative.   Eyes: Negative.   Respiratory: Negative.   Cardiovascular: Negative.   Gastrointestinal: Negative.   Endocrine: Negative.   Genitourinary:       RLQ pelvic pain  Musculoskeletal: Negative.   Skin: Negative.   Allergic/Immunologic: Negative.   Neurological: Negative.   Hematological: Negative.   Psychiatric/Behavioral: Negative.     PHYSICAL EXAMINATION:    Wt 131 lb (59.4 kg)   LMP 07/03/2015   BMI 21.80 kg/m     General appearance: alert, cooperative, no  acute distress  Abdomen: palpable lumps, feels like stool mass Neg murphy's, neg rebound, neg psoas  Pelvic: External genitalia:  no lesions              Urethra:  normal appearing urethra with no masses, tenderness or lesions              Bartholins and Skenes: normal                 Vagina: normal appearing vagina, normal appearing discharge  Cervix: no cervical motion tenderness and no lesions              Bimanual Exam:  Uterus:  normal size, contour, position, consistency, mobility, non-tender              Adnexa: no mass, fullness, tenderness               Chaperone, Joy, CMA, was present for exam.  Assessment: Pelvic pain  Plan: Discussed low suspicion of GYN origin History and exam most consistent with GI origin Recommend Miralax OTC, follow directions on container Try Ginger Tea Pt concerned about ovarian cancer, wants [place] Will schedule [place] and will schedule 1 week follow up for re-palpation of abdomen.   25 minutes of total time was spent for this patient encounter, including preparation, face-to-face counseling with the patient and coordination of care, and documentation of the encounter.

## 2021-02-21 ENCOUNTER — Other Ambulatory Visit: Payer: Self-pay | Admitting: Family Medicine

## 2021-02-23 ENCOUNTER — Encounter: Payer: Self-pay | Admitting: Medical

## 2021-02-23 ENCOUNTER — Other Ambulatory Visit (HOSPITAL_BASED_OUTPATIENT_CLINIC_OR_DEPARTMENT_OTHER): Payer: Self-pay

## 2021-02-23 ENCOUNTER — Other Ambulatory Visit: Payer: Self-pay

## 2021-02-23 ENCOUNTER — Telehealth (INDEPENDENT_AMBULATORY_CARE_PROVIDER_SITE_OTHER): Payer: 59 | Admitting: Medical

## 2021-02-23 VITALS — BP 112/66 | HR 69

## 2021-02-23 DIAGNOSIS — U071 COVID-19: Secondary | ICD-10-CM

## 2021-02-23 MED ORDER — NIRMATRELVIR & RITONAVIR 20 X 150 MG & 10 X 100MG PO TBPK
3.0000 | ORAL_TABLET | Freq: Two times a day (BID) | ORAL | 0 refills | Status: DC
  Filled 2021-02-23: qty 30, 30d supply, fill #0

## 2021-02-23 NOTE — Progress Notes (Deleted)
GYNECOLOGY  VISIT  CC:   ***  HPI: 50 y.o. G60P2012 Divorced White or Caucasian female here for ***.     GYNECOLOGIC HISTORY: Patient's last menstrual period was 07/03/2015. Contraception: *** Menopausal hormone therapy: ***  Patient Active Problem List   Diagnosis Date Noted  . Cough 12/22/2020  . Vaginitis 09/04/2020  . HSV infection 09/04/2020  . Preventative health care 09/04/2020  . Urinary hesitancy 06/05/2020  . Pelvic floor dysfunction 06/05/2020  . Nausea 03/02/2020  . Alopecia 06/27/2019  . Hyperlipidemia 12/27/2018  . Neck pain on right side 12/02/2014  . Fatigue 01/27/2014  . Perimenopausal 07/22/2013  . Otitis media 03/16/2012  . Palpitations 02/14/2012  . Anxiety and depression 07/31/2011  . Vitamin D deficiency 07/02/2011  . Multiple allergies 07/02/2011  . Interstitial cystitis   . IBS (irritable bowel syndrome)   . GERD (gastroesophageal reflux disease)   . Premature atrial contractions   . Adult ADHD     Past Medical History:  Diagnosis Date  . Allergy   . Alopecia   . Anxiety and depression 07/31/2011  . Chest pain 09/22/10   Sharp pains in left breast & sometimes in the right breast as well. Random in occurence & nonexertional. Also has occasional SOB & DOE when going up stairs & occasionally when working out. Nuclear Stress Treadmill 09/29/10 -   . HSV-2 (herpes simplex virus 2) infection   . IBS (irritable bowel syndrome)   . Interstitial cystitis   . Multiple allergies 07/02/2011  . Neuromuscular disorder ([place])    past hx dx of Fibromyalgia  . Otitis media 03/16/2012  . Perimenopausal 07/22/2013  . PONV (postoperative nausea and vomiting)   . Poor concentration 07/02/2011  . Shoulder pain, left 04/11/2012  . SOB (shortness of breath) 09/22/10   ECHO 09/29/10 - NL LVF, trivial PE, trivial TR/PR.  Marland Kitchen UTI (lower urinary tract infection) 08/28/2011    Past Surgical History:  Procedure Laterality Date  . BREAST BIOPSY Right 03/12/2016    Procedure: breast exploration;  Surgeon: Jackolyn Confer, MD;  Location: [facility];  Service: General;  Laterality: Right;  . BREAST ENHANCEMENT SURGERY  2007  . CESAREAN SECTION  2001 and 2004   x2  . COLONOSCOPY  2007  . LAPAROSCOPY     twice  . RHINOPLASTY  2003  . TONSILLECTOMY  1982  . TUBAL LIGATION    . UPPER GASTROINTESTINAL ENDOSCOPY  2011    MEDS:   Current Outpatient Medications on File Prior to Visit  Medication Sig Dispense Refill  . amphetamine-dextroamphetamine (ADDERALL) 20 MG tablet Take 1 tablet (20 mg total) by mouth 2 (two) times daily. October 2020 (Patient taking differently: Take 20 mg by mouth 2 (two) times daily. October 2020 PRN only) 60 tablet 0  . cetirizine (ZYRTEC) 10 MG tablet Take 1 tablet (10 mg total) by mouth 2 (two) times daily as needed for allergies. 60 tablet 2  . Cholecalciferol 50 MCG (2000 UT) TABS Take 1 tablet by mouth daily.    Marland Kitchen estradiol (ESTRACE) 0.5 MG tablet Take 2 tablets (1 mg total) by mouth daily. (Patient taking differently: Take 0.5 mg by mouth daily.) 180 tablet 4  . famotidine (PEPCID) 40 MG tablet TAKE 1 TABLET BY MOUTH EVERYDAY AT BEDTIME 30 tablet 1  . fexofenadine (ALLEGRA) 180 MG tablet Take 180 mg by mouth daily.      . finasteride (PROSCAR) 5 MG tablet TAKE 1 TABLET BY MOUTH EVERY DAY 90 tablet 0  .  flunisolide (NASALIDE) 25 MCG/ACT (0.025%) SOLN PLEASE SPECIFY DIRECTIONS, REFILLS AND QUANTITY 75 mL 11  . progesterone (PROMETRIUM) 100 MG capsule Take 100 mg by mouth at bedtime.    Orlie Dakin Sodium (SM NATURAL LAXATIVE/STOOL SOFT PO) Take 1 tablet by mouth.    . spironolactone (ALDACTONE) 100 MG tablet Take 100 mg by mouth daily.    Marland Kitchen tretinoin (RETIN-A) 0.025 % cream     . tretinoin (RETIN-A) 0.05 % cream Apply topically.    Marland Kitchen UNABLE TO FIND cocovia    . valACYclovir (VALTREX) 500 MG tablet Take 1 tablet (500 mg total) by mouth daily. 90 tablet 3  . XIIDRA 5 % SOLN      No current  facility-administered medications on file prior to visit.    ALLERGIES: Patient has no known allergies.  Family History  Problem Relation Age of Onset  . Heart disease Mother        MVP  . Anxiety disorder Mother   . Mitral valve prolapse Mother   . Alzheimer's disease Mother   . Colon polyps Mother   . Hypertension Brother   . Obesity Brother   . Diabetes Brother        DM, obese  . Allergies Daughter   . Allergies Son   . Kidney disease Maternal Grandmother        uremia  . Breast cancer Neg Hx   . Colon cancer Neg Hx   . Esophageal cancer Neg Hx   . Rectal cancer Neg Hx   . Stomach cancer Neg Hx      Review of Systems  PHYSICAL EXAMINATION:    LMP 07/03/2015     General appearance: alert, cooperative, no acute distress  CV:  {Exam; heart brief:31539} Lungs:  {pe lungs ob:314451::"clear to auscultation, no wheezes, rales or rhonchi, symmetric air entry"} Breasts: {Exam; breast:13139::"normal appearance, no masses or tenderness"} Abdomen: soft, non-tender; no masses,  no organomegaly Lymph:  no inguinal LAD noted  Pelvic: External genitalia:  no lesions              Urethra:  normal appearing urethra with no masses, tenderness or lesions              Bartholins and Skenes: normal                 Vagina: normal appearing vagina               Cervix: {CHL AMB PHY EX CERVIX NORM DEFAULT:[phone]::"no lesions"}              Bimanual Exam:  Uterus:  {CHL AMB PHY EX UTERUS NORM [phone]::"normal size, contour, position, consistency, mobility, non-tender"}              Adnexa: {CHL AMB PHY EX ADNEXA NO MASS DEFAULT:[phone]::"no mass, fullness, tenderness"}               Chaperone, ***, CMA, was present for exam.  Assessment: ***  Plan: ***   {NUMBERS; -10-45 JOINT ROM:10287} minutes of total time was spent for this patient encounter, including preparation, face-to-face counseling with the patient and coordination of care, and documentation of the  encounter.

## 2021-02-23 NOTE — Progress Notes (Signed)
Subjective:    Patient ID: [name], female    DOB: 1971-03-18, 50 y.o.   MRN: [id number]  HPI  Virtual Visit via Video Note  I connected with [name] on 02/23/21 at  2:00 PM EDT by a video enabled telemedicine application and verified that I am speaking with the correct person using two identifiers.  Location: Patient: home Provider: office.   I discussed the limitations of evaluation and management by telemedicine and the availability of in person appointments. The patient expressed understanding and agreed to proceed.  History of Present Illness:   Pt states she was exposed to covid. Pt has been testing since beeing exposed late last week. She originally tested negative wed and Friday last week. Saturday tested +.   Pt has felt feverish. Had fever 103.5, achy,  sinus pressure, coughing a lot and very fatigued.   Wit alleve temp cam down 100 ealier today after temp max 103.5. Now 99.4.  Pt has been vaccinated twice with covid vaccine. Got boosted  October 02, 2020.   No sob, no wheezing and no chest pain.   Pt has not taken any vit D, zinc over last couple of day.   Observations/Objective:  General-no acute distress, pleasant, oriented. Lungs- on inspection lungs appear unlabored. Neck- no tracheal deviation or jvd on inspection. Neuro- gross motor function appears intact.   Assessment and Plan: Recent COVID positive test 3 days ago.  Mild to moderate symptoms with risk score of 0.  Recommend rest, hydrate and can use combination of Tylenol and Aleve as discussed to control fever and myalgias.  Vitamin D over-the-counter can use 1 tab 4000 international units daily.  Zinc 50 mg daily.  Paxlovid prescription sent to patient's to [facility].  Discussed timeframe to use medication being 5 days from positive test.  Benefits versus risks discussed. Pt indicates she will consider and see how she feels within next 2 days before  deciding.  Advise check oxygen saturations daily.  If oxygen sat numbers less than 94% let us know.  If lower with shortness of breath and wheezing then recommend ED evaluation.  Advised if sinus infectious type symptoms or chest congestion then would prescribe azithromycin antibiotic.  Follow-up in 7 to 10 days or as needed.  Note if seen in 7 to 10 days would need to be virtual as explained due to covid precautions.  Follow Up Instructions:    I discussed the assessment and treatment plan with the patient. The patient was provided an opportunity to ask questions and all were answered. The patient agreed with the plan and demonstrated an understanding of the instructions.   The patient was advised to call back or seek an in-person evaluation if the symptoms worsen or if the condition fails to improve as anticipated.  Time spent with patient today was 35  minutes which consisted of chart review, discussing diagnosis, treatment and documentation.   Mackie Pai, PA-C   Review of Systems  Constitutional: Positive for fatigue and fever. Negative for chills.  HENT: Positive for congestion and sinus pressure.   Respiratory: Positive for cough. Negative for chest tightness, shortness of breath and wheezing.   Cardiovascular: Negative for chest pain and palpitations.  Gastrointestinal: Negative for abdominal pain and diarrhea.  Musculoskeletal: Positive for myalgias. Negative for neck pain and neck stiffness.  Skin: Negative for rash.  Neurological: Negative for dizziness, speech difficulty, weakness, numbness and headaches.  Hematological: Negative for adenopathy. Does not bruise/bleed easily.  Psychiatric/Behavioral:  Negative for behavioral problems, decreased concentration and dysphoric mood.       Objective:   Physical Exam        Assessment & Plan:  Recent COVID positive test 3 days ago.  Mild to moderate symptoms with risk score of 0.  Recommend rest, hydrate and can use  combination of Tylenol and Aleve as discussed to control fever and myalgias.  Vitamin D over-the-counter can use 1 tab 4000 international units daily.  Zinc 50 mg daily.  Paxlovid prescription sent to patient's pharmacy.  Discussed timeframe to use medication being 5days from positive test.  Benefits versus risks discussed.  Advise check oxygen saturations daily.  If oxygen sat numbers less than 94% let us know.  If lower with shortness of breath and wheezing then recommend ED evaluation.  Advised if sinus infectious type symptoms or chest congestion then would prescribe azithromycin antibiotic.  Follow-up in 7 to 10 days or as needed.  Note if seen in 7 to 10 days would need to be virtual as explained

## 2021-02-23 NOTE — Patient Instructions (Addendum)
Recent COVID positive test 3 days ago.  Mild to moderate symptoms with risk score of 0.  Recommend rest, hydrate and can use combination of Tylenol and Aleve as discussed to control fever and myalgias.  Vitamin D over-the-counter can use 1 tab 4000 international units daily.  Zinc 50 mg daily.  Paxlovid prescription sent to patient's to [facility].  Discussed timeframe to use medication being 5 days from positive test.  Benefits versus risks discussed. Pt indicates she will consider and see how she feels within next 2 days before deciding.  Advise check oxygen saturations daily.  If oxygen sat numbers less than 94% let us know.  If lower with shortness of breath and wheezing then recommend ED evaluation.  Advised if sinus infectious type symptoms or chest congestion then would prescribe azithromycin antibiotic.  Follow-up in 7 to 10 days or as needed.  Note if seen in 7 to 10 days would need to be virtual as explained due to covid precautions.

## 2021-02-24 ENCOUNTER — Ambulatory Visit: Payer: 59 | Admitting: Nurse Practitioner

## 2021-02-27 ENCOUNTER — Other Ambulatory Visit: Payer: Self-pay

## 2021-02-27 ENCOUNTER — Telehealth: Payer: Self-pay | Admitting: Family Medicine

## 2021-02-27 DIAGNOSIS — R059 Cough, unspecified: Secondary | ICD-10-CM

## 2021-02-27 NOTE — Telephone Encounter (Signed)
Referral placed.

## 2021-02-27 NOTE — Telephone Encounter (Signed)
Patient states in a previous appt that Dr. B would send her to an ENT provider if her cough is not better. She states she still has the cough and would like to be referred. Please advise if referral can be  Sent.

## 2021-03-03 ENCOUNTER — Other Ambulatory Visit (HOSPITAL_BASED_OUTPATIENT_CLINIC_OR_DEPARTMENT_OTHER): Payer: Self-pay

## 2021-03-04 ENCOUNTER — Ambulatory Visit: Payer: 59

## 2021-03-05 ENCOUNTER — Telehealth (INDEPENDENT_AMBULATORY_CARE_PROVIDER_SITE_OTHER): Payer: 59 | Admitting: Family Medicine

## 2021-03-05 ENCOUNTER — Other Ambulatory Visit: Payer: Self-pay

## 2021-03-05 DIAGNOSIS — F419 Anxiety disorder, unspecified: Secondary | ICD-10-CM | POA: Diagnosis not present

## 2021-03-05 DIAGNOSIS — F32A Depression, unspecified: Secondary | ICD-10-CM

## 2021-03-05 DIAGNOSIS — F909 Attention-deficit hyperactivity disorder, unspecified type: Secondary | ICD-10-CM | POA: Diagnosis not present

## 2021-03-05 DIAGNOSIS — E559 Vitamin D deficiency, unspecified: Secondary | ICD-10-CM

## 2021-03-05 MED ORDER — ALBUTEROL SULFATE HFA 108 (90 BASE) MCG/ACT IN AERS: 2.0000 | INHALATION_SPRAY | Freq: Four times a day (QID) | RESPIRATORY_TRACT | 0 refills | Status: DC | PRN

## 2021-03-05 MED ORDER — AMPHETAMINE-DEXTROAMPHETAMINE 20 MG PO TABS: 20.0000 mg | ORAL_TABLET | Freq: Two times a day (BID) | ORAL | 0 refills | Status: DC

## 2021-03-05 MED ORDER — AEROCHAMBER MINI CHAMBER DEVI: 0 refills | Status: DC

## 2021-03-06 NOTE — Progress Notes (Signed)
MyChart Video Visit    Virtual Visit via Video Note   This visit type was conducted due to national recommendations for restrictions regarding the COVID-19 Pandemic (e.g. social distancing) in an effort to limit this patient's exposure and mitigate transmission in our community. This patient is at least at moderate risk for complications without adequate follow up. This format is felt to be most appropriate for this patient at this time. Physical exam was limited by quality of the video and audio technology used for the visit. S Chism, CMA was able to get the patient set up on a video visit.  Patient location: home Patient and provider in visit Provider location: Office  I discussed the limitations of evaluation and management by telemedicine and the availability of in person appointments. The patient expressed understanding and agreed to proceed.  Visit Date: 03/05/2021  Today's healthcare provider: Danise Edge, MD     Subjective:    Patient ID: [name], female    DOB: Apr 02, 1971, 50 y.o.   MRN: [id number]  Chief Complaint  Patient presents with  . Follow-up  . ADHD    HPI Patient is in today for follow up on chronic medical concerns including ADHD and depression. No recent febrile illness or hospitalizations. She has weaned off the Citalopram and she feels she is doing well. She feels a little teary at times but feels it is minor and overall she is happy with her progress. Denies CP/palp/SOB/HA/congestion/fevers/GI or GU c/o. Taking meds as prescribed  Past Medical History:  Diagnosis Date  . Allergy   . Alopecia   . Anxiety and depression 07/31/2011  . Chest pain 09/22/10   Sharp pains in left breast & sometimes in the right breast as well. Random in occurence & nonexertional. Also has occasional SOB & DOE when going up stairs & occasionally when working out. Nuclear Stress Treadmill 09/29/10 -   . HSV-2 (herpes simplex virus 2) infection   . IBS  (irritable bowel syndrome)   . Interstitial cystitis   . Multiple allergies 07/02/2011  . Neuromuscular disorder ([facility])    past hx dx of Fibromyalgia  . Otitis media 03/16/2012  . Perimenopausal 07/22/2013  . PONV (postoperative nausea and vomiting)   . Poor concentration 07/02/2011  . Shoulder pain, left 04/11/2012  . SOB (shortness of breath) 09/22/10   ECHO 09/29/10 - NL LVF, trivial PE, trivial TR/PR.  Marland Kitchen UTI (lower urinary tract infection) 08/28/2011    Past Surgical History:  Procedure Laterality Date  . BREAST BIOPSY Right 03/12/2016   Procedure: breast exploration;  Surgeon: Avel Peace, MD;  Location: [facility];  Service: General;  Laterality: Right;  . BREAST ENHANCEMENT SURGERY  2007  . CESAREAN SECTION  2001 and 2004   x2  . COLONOSCOPY  2007  . LAPAROSCOPY     twice  . RHINOPLASTY  2003  . TONSILLECTOMY  1982  . TUBAL LIGATION    . UPPER GASTROINTESTINAL ENDOSCOPY  2011    Family History  Problem Relation Age of Onset  . Heart disease Mother        MVP  . Anxiety disorder Mother   . Mitral valve prolapse Mother   . Alzheimer's disease Mother   . Colon polyps Mother   . Hypertension Brother   . Obesity Brother   . Diabetes Brother        DM, obese  . Allergies Daughter   . Allergies Son   . Kidney disease Maternal  Grandmother        uremia  . Breast cancer Neg Hx   . Colon cancer Neg Hx   . Esophageal cancer Neg Hx   . Rectal cancer Neg Hx   . Stomach cancer Neg Hx     Social History   Socioeconomic History  . Marital status: Divorced    Spouse name: Not on file  . Number of children: Not on file  . Years of education: Not on file  . Highest education level: Not on file  Occupational History  . Not on file  Tobacco Use  . Smoking status: Never Smoker  . Smokeless tobacco: Never Used  Vaping Use  . Vaping Use: Never used  Substance and Sexual Activity  . Alcohol use: Not Currently    Alcohol/week: 0.0 standard drinks  .  Drug use: Never  . Sexual activity: Yes    Partners: Male    Birth control/protection: Surgical    Comment: Tubal lig-1st intercourse 50 yo-More than 5 partners  Other Topics Concern  . Not on file  Social History Narrative  . Not on file   Social Determinants of Health   Financial Resource Strain: Not on file  Food Insecurity: Not on file  Transportation Needs: Not on file  Physical Activity: Not on file  Stress: Not on file  Social Connections: Not on file  Intimate Partner Violence: Not on file    Outpatient Medications Prior to Visit  Medication Sig Dispense Refill  . cetirizine (ZYRTEC) 10 MG tablet Take 1 tablet (10 mg total) by mouth 2 (two) times daily as needed for allergies. 60 tablet 2  . Cholecalciferol 50 MCG (2000 UT) TABS Take 1 tablet by mouth daily.    Marland Kitchen estradiol (ESTRACE) 0.5 MG tablet Take 2 tablets (1 mg total) by mouth daily. (Patient taking differently: Take 0.5 mg by mouth daily.) 180 tablet 4  . famotidine (PEPCID) 40 MG tablet TAKE 1 TABLET BY MOUTH EVERYDAY AT BEDTIME 30 tablet 1  . fexofenadine (ALLEGRA) 180 MG tablet Take 180 mg by mouth daily.      . finasteride (PROSCAR) 5 MG tablet TAKE 1 TABLET BY MOUTH EVERY DAY 90 tablet 0  . flunisolide (NASALIDE) 25 MCG/ACT (0.025%) SOLN PLEASE SPECIFY DIRECTIONS, REFILLS AND QUANTITY 75 mL 11  . Nirmatrelvir & Ritonavir 20 x 150 MG & 10 x 100MG  TBPK Take 2 tablets of Nirmatrelvir and 1 tablet of Ritonavir together in the morning and at bedtime for 5 days. 30 tablet 0  . progesterone (PROMETRIUM) 100 MG capsule Take 100 mg by mouth at bedtime.    Orlie Dakin Sodium (SM NATURAL LAXATIVE/STOOL SOFT PO) Take 1 tablet by mouth.    . spironolactone (ALDACTONE) 100 MG tablet Take 100 mg by mouth daily.    Marland Kitchen tretinoin (RETIN-A) 0.025 % cream     . tretinoin (RETIN-A) 0.05 % cream Apply topically.    Marland Kitchen UNABLE TO FIND cocovia    . valACYclovir (VALTREX) 500 MG tablet Take 1 tablet (500 mg total) by mouth  daily. 90 tablet 3  . XIIDRA 5 % SOLN     . amphetamine-dextroamphetamine (ADDERALL) 20 MG tablet Take 1 tablet (20 mg total) by mouth 2 (two) times daily. October 2020 (Patient taking differently: Take 20 mg by mouth 2 (two) times daily. October 2020 PRN only) 60 tablet 0  . citalopram (CELEXA) 10 MG tablet Take 1 tablet (10 mg total) by mouth daily. 90 tablet 1   No facility-administered medications  prior to visit.    No Known Allergies  ROS     Objective:    Physical Exam  LMP 07/03/2015  Wt Readings from Last 3 Encounters:  02/19/21 131 lb (59.4 kg)  12/10/20 131 lb 2 oz (59.5 kg)  09/04/20 127 lb 3.2 oz (57.7 kg)    Diabetic Foot Exam - Simple   No data filed    Lab Results  Component Value Date   WBC 5.5 09/05/2020   HGB 13.9 09/05/2020   HCT 42.3 09/05/2020   PLT 238.0 09/05/2020   GLUCOSE 89 09/05/2020   CHOL 201 (H) 09/05/2020   TRIG 66.0 09/05/2020   HDL 61.10 09/05/2020   LDLCALC 127 (H) 09/05/2020   ALT 16 09/05/2020   AST 17 09/05/2020   NA 140 09/05/2020   K 4.2 09/05/2020   CL 103 09/05/2020   CREATININE 0.85 09/05/2020   BUN 22 09/05/2020   CO2 31 09/05/2020   TSH 1.33 09/05/2020    Lab Results  Component Value Date   TSH 1.33 09/05/2020   Lab Results  Component Value Date   WBC 5.5 09/05/2020   HGB 13.9 09/05/2020   HCT 42.3 09/05/2020   MCV 90.1 09/05/2020   PLT 238.0 09/05/2020   Lab Results  Component Value Date   NA 140 09/05/2020   K 4.2 09/05/2020   CO2 31 09/05/2020   GLUCOSE 89 09/05/2020   BUN 22 09/05/2020   CREATININE 0.85 09/05/2020   BILITOT 0.7 09/05/2020   ALKPHOS 38 (L) 09/05/2020   AST 17 09/05/2020   ALT 16 09/05/2020   PROT 6.9 09/05/2020   ALBUMIN 4.7 09/05/2020   CALCIUM 9.5 09/05/2020   GFR 79.13 09/05/2020   Lab Results  Component Value Date   CHOL 201 (H) 09/05/2020   Lab Results  Component Value Date   HDL 61.10 09/05/2020   Lab Results  Component Value Date   LDLCALC 127 (H) 09/05/2020    Lab Results  Component Value Date   TRIG 66.0 09/05/2020   Lab Results  Component Value Date   CHOLHDL 3 09/05/2020   No results found for: HGBA1C     Assessment & Plan:   Problem List Items Addressed This Visit    Adult ADHD    Doing well on current meds no change in therapy, given refill on Adderall today      Vitamin D deficiency    Supplement and monitor      Anxiety and depression    She has weaned off of Citalopram and is doing well. She will notify us if anything changes.          I have discontinued [name] "Blanca"'s citalopram. I have also changed her amphetamine-dextroamphetamine. Additionally, I am having her start on albuterol and AeroChamber Mini Chamber. Lastly, I am having her maintain her fexofenadine, tretinoin, Cholecalciferol, spironolactone, estradiol, Sennosides-Docusate Sodium (SM NATURAL LAXATIVE/STOOL SOFT PO), flunisolide, valACYclovir, finasteride, cetirizine, famotidine, tretinoin, progesterone, Xiidra, UNABLE TO FIND, and Nirmatrelvir & Ritonavir.  Meds ordered this encounter  Medications  . albuterol (VENTOLIN HFA) 108 (90 Base) MCG/ACT inhaler    Sig: Inhale 2 puffs into the lungs every 6 (six) hours as needed for wheezing or shortness of breath.    Dispense:  18 g    Refill:  0  . Spacer/Aero-Holding Chambers (AEROCHAMBER MINI CHAMBER) DEVI    Sig: Use as needed with albuterol HFA    Dispense:  1 each    Refill:  0  .  amphetamine-dextroamphetamine (ADDERALL) 20 MG tablet    Sig: Take 1 tablet (20 mg total) by mouth 2 (two) times daily.    Dispense:  60 tablet    Refill:  0    I discussed the assessment and treatment plan with the patient. The patient was provided an opportunity to ask questions and all were answered. The patient agreed with the plan and demonstrated an understanding of the instructions.   The patient was advised to call back or seek an in-person evaluation if the symptoms worsen or if the condition fails  to improve as anticipated.  I provided 15 minutes of face-to-face time during this encounter.   Penni Homans, MD [facility] [phone] (phone) [phone] (fax)  [place]

## 2021-03-06 NOTE — Assessment & Plan Note (Signed)
Doing well on current meds no change in therapy, given refill on Adderall today

## 2021-03-06 NOTE — Assessment & Plan Note (Signed)
Supplement and monitor 

## 2021-03-06 NOTE — Assessment & Plan Note (Signed)
She has weaned off of Citalopram and is doing well. She will notify us if anything changes.

## 2021-03-10 ENCOUNTER — Ambulatory Visit: Payer: 59 | Admitting: Nurse Practitioner

## 2021-03-12 ENCOUNTER — Encounter: Payer: Self-pay | Admitting: Nurse Practitioner

## 2021-03-12 ENCOUNTER — Other Ambulatory Visit: Payer: Self-pay | Admitting: Family Medicine

## 2021-03-12 ENCOUNTER — Other Ambulatory Visit (HOSPITAL_COMMUNITY)
Admission: RE | Admit: 2021-03-12 | Discharge: 2021-03-12 | Disposition: A | Discharge status: Home / Self Care | Payer: 59 | Source: Ambulatory Visit | Attending: Nurse Practitioner | Admitting: Nurse Practitioner

## 2021-03-12 ENCOUNTER — Ambulatory Visit (INDEPENDENT_AMBULATORY_CARE_PROVIDER_SITE_OTHER): Payer: 59 | Admitting: Nurse Practitioner

## 2021-03-12 ENCOUNTER — Other Ambulatory Visit: Payer: Self-pay

## 2021-03-12 VITALS — BP 116/74 | Ht 65.5 in | Wt 129.0 lb

## 2021-03-12 DIAGNOSIS — Z01419 Encounter for gynecological examination (general) (routine) without abnormal findings: Secondary | ICD-10-CM

## 2021-03-12 DIAGNOSIS — N951 Menopausal and female climacteric states: Secondary | ICD-10-CM | POA: Diagnosis not present

## 2021-03-12 DIAGNOSIS — R102 Pelvic and perineal pain: Secondary | ICD-10-CM

## 2021-03-12 DIAGNOSIS — Z113 Encounter for screening for infections with a predominantly sexual mode of transmission: Secondary | ICD-10-CM

## 2021-03-12 NOTE — Progress Notes (Signed)
GYNECOLOGY  VISIT  RJ:JOACZY up check for pelvic fullness  and for annual exam  HPI: 50 y.o. G6P2012 Divorced White or Caucasian female here for pelvic pain.   Pelvic pain persistent, not as frequently (see note from 02/19/2021) occurs about 1 to 2 times per day. Used Miralax as recommended and had good results. Desires STD testing today.  [place] planned and is scheduled next week. (She is concerned about ovarian cancer, although discussed limitation of ultrasound)  Mammogram: scheduled for 04/21/2021 Pap: WNL/Neg HPV 2019, wants today Colorectal Screening:UTD  04/2020  Taking HRT, denies vaginal bleeding  GYNECOLOGIC HISTORY: Patient's last menstrual period was 07/03/2015. Contraception:Postmenopausal Menopausal hormone therapy:Estrogen and progesterone  Patient Active Problem List   Diagnosis Date Noted  . Cough 12/22/2020  . Vaginitis 09/04/2020  . HSV infection 09/04/2020  . Preventative health care 09/04/2020  . Urinary hesitancy 06/05/2020  . Pelvic floor dysfunction 06/05/2020  . Nausea 03/02/2020  . Alopecia 06/27/2019  . Hyperlipidemia 12/27/2018  . Neck pain on right side 12/02/2014  . Fatigue 01/27/2014  . Perimenopausal 07/22/2013  . Otitis media 03/16/2012  . Palpitations 02/14/2012  . Anxiety and depression 07/31/2011  . Vitamin D deficiency 07/02/2011  . Multiple allergies 07/02/2011  . Interstitial cystitis   . IBS (irritable bowel syndrome)   . GERD (gastroesophageal reflux disease)   . Premature atrial contractions   . Adult ADHD     Past Medical History:  Diagnosis Date  . Allergy   . Alopecia   . Anxiety and depression 07/31/2011  . Chest pain 09/22/10   Sharp pains in left breast & sometimes in the right breast as well. Random in occurence & nonexertional. Also has occasional SOB & DOE when going up stairs & occasionally when working out. Nuclear Stress Treadmill 09/29/10 -   . HSV-2 (herpes simplex virus 2) infection   . IBS (irritable bowel  syndrome)   . Interstitial cystitis   . Multiple allergies 07/02/2011  . Neuromuscular disorder ([place])    past hx dx of Fibromyalgia  . Otitis media 03/16/2012  . Perimenopausal 07/22/2013  . PONV (postoperative nausea and vomiting)   . Poor concentration 07/02/2011  . Shoulder pain, left 04/11/2012  . SOB (shortness of breath) 09/22/10   ECHO 09/29/10 - NL LVF, trivial PE, trivial TR/PR.  Marland Kitchen UTI (lower urinary tract infection) 08/28/2011    Past Surgical History:  Procedure Laterality Date  . BREAST BIOPSY Right 03/12/2016   Procedure: breast exploration;  Surgeon: Jackolyn Confer, MD;  Location: [place];  Service: General;  Laterality: Right;  . BREAST ENHANCEMENT SURGERY  2007  . CESAREAN SECTION  2001 and 2004   x2  . COLONOSCOPY  2007  . LAPAROSCOPY     twice  . RHINOPLASTY  2003  . TONSILLECTOMY  1982  . TUBAL LIGATION    . UPPER GASTROINTESTINAL ENDOSCOPY  2011    MEDS:   Current Outpatient Medications on File Prior to Visit  Medication Sig Dispense Refill  . albuterol (VENTOLIN HFA) 108 (90 Base) MCG/ACT inhaler Inhale 2 puffs into the lungs every 6 (six) hours as needed for wheezing or shortness of breath. 18 g 0  . amphetamine-dextroamphetamine (ADDERALL) 20 MG tablet Take 1 tablet (20 mg total) by mouth 2 (two) times daily. 60 tablet 0  . cetirizine (ZYRTEC) 10 MG tablet Take 1 tablet (10 mg total) by mouth 2 (two) times daily as needed for allergies. 60 tablet 2  . Cholecalciferol 50 MCG (2000 UT)  TABS Take 1 tablet by mouth daily.    Marland Kitchen estradiol (ESTRACE) 0.5 MG tablet Take 2 tablets (1 mg total) by mouth daily. (Patient taking differently: Take 0.5 mg by mouth daily.) 180 tablet 4  . famotidine (PEPCID) 40 MG tablet TAKE 1 TABLET BY MOUTH EVERYDAY AT BEDTIME 30 tablet 1  . fexofenadine (ALLEGRA) 180 MG tablet Take 180 mg by mouth daily.      . finasteride (PROSCAR) 5 MG tablet TAKE 1 TABLET BY MOUTH EVERY DAY 90 tablet 0  . flunisolide (NASALIDE) 25  MCG/ACT (0.025%) SOLN PLEASE SPECIFY DIRECTIONS, REFILLS AND QUANTITY 75 mL 11  . progesterone (PROMETRIUM) 100 MG capsule Take 100 mg by mouth at bedtime.    Marland Kitchen Spacer/Aero-Holding Chambers (AEROCHAMBER MINI CHAMBER) DEVI Use as needed with albuterol HFA 1 each 0  . spironolactone (ALDACTONE) 100 MG tablet Take 100 mg by mouth daily.    Marland Kitchen tretinoin (RETIN-A) 0.025 % cream     . tretinoin (RETIN-A) 0.05 % cream Apply topically.    Marland Kitchen UNABLE TO FIND cocovia    . valACYclovir (VALTREX) 500 MG tablet Take 1 tablet (500 mg total) by mouth daily. 90 tablet 3  . XIIDRA 5 % SOLN     . Nirmatrelvir & Ritonavir 20 x 150 MG & 10 x 100MG  TBPK Take 2 tablets of Nirmatrelvir and 1 tablet of Ritonavir together in the morning and at bedtime for 5 days. (Patient not taking: Reported on 03/12/2021) 30 tablet 0  . Sennosides-Docusate Sodium (SM NATURAL LAXATIVE/STOOL SOFT PO) Take 1 tablet by mouth. (Patient not taking: Reported on 03/12/2021)     No current facility-administered medications on file prior to visit.    ALLERGIES: Patient has no known allergies.  Family History  Problem Relation Age of Onset  . Heart disease Mother        MVP  . Anxiety disorder Mother   . Mitral valve prolapse Mother   . Alzheimer's disease Mother   . Colon polyps Mother   . Hypertension Brother   . Obesity Brother   . Diabetes Brother        DM, obese  . Allergies Daughter   . Allergies Son   . Kidney disease Maternal Grandmother        uremia  . Breast cancer Neg Hx   . Colon cancer Neg Hx   . Esophageal cancer Neg Hx   . Rectal cancer Neg Hx   . Stomach cancer Neg Hx      Review of Systems  PHYSICAL EXAMINATION:    BP 116/74   LMP 07/03/2015     General appearance: alert, cooperative, no acute distress  CV:  Regular rate and rhythm Lungs:  clear to auscultation, no wheezes, rales or rhonchi, symmetric air entry Breasts: agumentation, no palpable abnormality Abdomen: soft, non-tender; no masses,  no  organomegaly Lymph:  no inguinal LAD noted  Pelvic: External genitalia:  no lesions              Urethra:  normal appearing urethra with no masses, tenderness or lesions              Bartholins and Skenes: normal                 Vagina: normal appearing vagina               Cervix: no cervical motion tenderness, no lesions and stenotic  Bimanual Exam:  Uterus:  normal size, contour, position, consistency, mobility, non-tender              Adnexa: no mass, fullness, tenderness               Chaperone, [name], CMA, was present for exam.  Assessment: Well woman exam - Plan: Cytology - PAP( [place])  Pelvic pain- Pelvic US previously scheduled  (next week)  Screen for STD (sexually transmitted disease) - Plan: Cytology - PAP( [place])

## 2021-03-16 ENCOUNTER — Other Ambulatory Visit: Payer: Self-pay | Admitting: Family Medicine

## 2021-03-16 LAB — CYTOLOGY - PAP
Chlamydia: NEGATIVE
Comment: NEGATIVE
Comment: NEGATIVE
Comment: NORMAL
High risk HPV: NEGATIVE
Neisseria Gonorrhea: NEGATIVE

## 2021-03-17 ENCOUNTER — Telehealth: Payer: Self-pay

## 2021-03-17 ENCOUNTER — Encounter: Payer: Self-pay | Admitting: Nurse Practitioner

## 2021-03-17 MED ORDER — PROGESTERONE MICRONIZED 100 MG PO CAPS: 100.0000 mg | ORAL_CAPSULE | Freq: Every day | ORAL | 4 refills | Status: DC

## 2021-03-17 MED ORDER — ESTRADIOL 10 MCG VA TABS: 1.0000 | ORAL_TABLET | VAGINAL | 4 refills | Status: DC

## 2021-03-17 MED ORDER — ESTRADIOL 0.5 MG PO TABS: 1.0000 mg | ORAL_TABLET | Freq: Every day | ORAL | 4 refills | Status: DC

## 2021-03-17 NOTE — Telephone Encounter (Signed)
Patient called in voice mail stating she received her result and she would like to speak directly with Claiborne Billings about it.  (Pap smear from 03/12/21 abnormal and is in chart.)

## 2021-03-17 NOTE — Progress Notes (Signed)
Pap History:  03/2021- LSIL pap, endocervical cells present, Neg HR HPV 2019-NIL pap, no endocervical component, Neg HR HPV 2018- NIL, no endocervical component, HPV not tested  ASCCP algorithm recommends Co-testing 3 years, however with history of lack of endocervical cells, will recommend Co-testing 1 year. (5 year risk of CIN 3+ = 0.25%)

## 2021-03-17 NOTE — Addendum Note (Signed)
Addended by: Donivan Scull on: 03/17/2021 11:43 AM   Modules accepted: Orders

## 2021-03-17 NOTE — Progress Notes (Signed)
Discussion via telephone regarding hormone therapy. Pt is taking estrogen orally 0.5 mg (2 tablets) daily for night sweats and vaginal dryness. She would like to continue. Discussed risks. Discussed having flexibility to get to the lowest effective dose. Hopefully by next year she will feel comfortable with 0.5mg , and perhaps will discuss changing over to the patch. Will continue daily Prometrium. Will add vaginal estrogen to help with vaginal symptoms. Medication refills sent to pharmacy. Karma Ganja, [facility]

## 2021-03-19 ENCOUNTER — Other Ambulatory Visit: Payer: 59

## 2021-03-19 ENCOUNTER — Other Ambulatory Visit: Payer: 59 | Admitting: Obstetrics and Gynecology

## 2021-03-20 ENCOUNTER — Telehealth: Payer: Self-pay | Admitting: *Deleted

## 2021-03-20 NOTE — Telephone Encounter (Signed)
PA done via phone with [facility] [phone] for estradiol vaginal 10 mcg tablet. Will for response can be 24-48 hour turn around time.

## 2021-03-20 NOTE — Progress Notes (Deleted)
50 y.o. [id number] Divorced White or Caucasian female here for annual exam.      Patient's last menstrual period was 07/03/2015.            Sexually active: {yes no:314532}  The current method of family planning is {contraception:315051}.    Exercising: {yes no:314532}  {types:19826} Smoker:  {YES NO:22349}  Health Maintenance: Pap:  *** History of abnormal Pap:  {YES NO:22349} MMG:  *** Colonoscopy:  *** BMD:   *** Gardasil:   *** Covid-19: *** Hep C testing: *** Screening Labs: ***   reports that she has never smoked. She has never used smokeless tobacco. She reports previous alcohol use. She reports that she does not use drugs.  Past Medical History:  Diagnosis Date  . Allergy   . Alopecia   . Anxiety and depression 07/31/2011  . Chest pain 09/22/10   Sharp pains in left breast & sometimes in the right breast as well. Random in occurence & nonexertional. Also has occasional SOB & DOE when going up stairs & occasionally when working out. Nuclear Stress Treadmill 09/29/10 -   . HSV-2 (herpes simplex virus 2) infection   . IBS (irritable bowel syndrome)   . Interstitial cystitis   . Multiple allergies 07/02/2011  . Neuromuscular disorder ([place])    past hx dx of Fibromyalgia  . Otitis media 03/16/2012  . Perimenopausal 07/22/2013  . PONV (postoperative nausea and vomiting)   . Poor concentration 07/02/2011  . Shoulder pain, left 04/11/2012  . SOB (shortness of breath) 09/22/10   ECHO 09/29/10 - NL LVF, trivial PE, trivial TR/PR.  Marland Kitchen UTI (lower urinary tract infection) 08/28/2011    Past Surgical History:  Procedure Laterality Date  . BREAST BIOPSY Right 03/12/2016   Procedure: breast exploration;  Surgeon: Jackolyn Confer, MD;  Location: [place];  Service: General;  Laterality: Right;  . BREAST ENHANCEMENT SURGERY  2007  . CESAREAN SECTION  2001 and 2004   x2  . COLONOSCOPY  2007  . LAPAROSCOPY     twice  . RHINOPLASTY  2003  . TONSILLECTOMY  1982  .  TUBAL LIGATION    . UPPER GASTROINTESTINAL ENDOSCOPY  2011    Current Outpatient Medications  Medication Sig Dispense Refill  . albuterol (VENTOLIN HFA) 108 (90 Base) MCG/ACT inhaler Inhale 2 puffs into the lungs every 6 (six) hours as needed for wheezing or shortness of breath. 18 g 0  . amphetamine-dextroamphetamine (ADDERALL) 20 MG tablet Take 1 tablet (20 mg total) by mouth 2 (two) times daily. 60 tablet 0  . cetirizine (ZYRTEC) 10 MG tablet Take 1 tablet (10 mg total) by mouth 2 (two) times daily as needed for allergies. 60 tablet 2  . Cholecalciferol 50 MCG (2000 UT) TABS Take 1 tablet by mouth daily.    Marland Kitchen estradiol (ESTRACE) 0.5 MG tablet Take 2 tablets (1 mg total) by mouth daily. 180 tablet 4  . Estradiol (VAGIFEM) 10 MCG TABS vaginal tablet Place 1 tablet (10 mcg total) vaginally 2 (two) times a week. 24 tablet 4  . famotidine (PEPCID) 40 MG tablet TAKE 1 TABLET BY MOUTH EVERYDAY AT BEDTIME 30 tablet 1  . fexofenadine (ALLEGRA) 180 MG tablet Take 180 mg by mouth daily.      . finasteride (PROSCAR) 5 MG tablet TAKE 1 TABLET BY MOUTH EVERY DAY 90 tablet 1  . flunisolide (NASALIDE) 25 MCG/ACT (0.025%) SOLN PLEASE SPECIFY DIRECTIONS, REFILLS AND QUANTITY 75 mL 11  . Nirmatrelvir & Ritonavir 20 x  150 MG & 10 x 100MG  TBPK Take 2 tablets of Nirmatrelvir and 1 tablet of Ritonavir together in the morning and at bedtime for 5 days. (Patient not taking: Reported on 03/12/2021) 30 tablet 0  . progesterone (PROMETRIUM) 100 MG capsule Take 1 capsule (100 mg total) by mouth at bedtime. 90 capsule 4  . Sennosides-Docusate Sodium (SM NATURAL LAXATIVE/STOOL SOFT PO) Take 1 tablet by mouth. (Patient not taking: Reported on 03/12/2021)    . Spacer/Aero-Holding Chambers (AEROCHAMBER MINI CHAMBER) DEVI Use as needed with albuterol HFA 1 each 0  . spironolactone (ALDACTONE) 100 MG tablet Take 100 mg by mouth daily.    Marland Kitchen tretinoin (RETIN-A) 0.025 % cream     . tretinoin (RETIN-A) 0.05 % cream Apply topically.     Marland Kitchen UNABLE TO FIND cocovia    . valACYclovir (VALTREX) 500 MG tablet Take 1 tablet (500 mg total) by mouth daily. 90 tablet 3  . XIIDRA 5 % SOLN      No current facility-administered medications for this visit.    Family History  Problem Relation Age of Onset  . Heart disease Mother        MVP  . Anxiety disorder Mother   . Mitral valve prolapse Mother   . Alzheimer's disease Mother   . Colon polyps Mother   . Hypertension Brother   . Obesity Brother   . Diabetes Brother        DM, obese  . Allergies Daughter   . Allergies Son   . Kidney disease Maternal Grandmother        uremia  . Breast cancer Neg Hx   . Colon cancer Neg Hx   . Esophageal cancer Neg Hx   . Rectal cancer Neg Hx   . Stomach cancer Neg Hx     Review of Systems  Exam:   LMP 07/03/2015      General appearance: alert, cooperative and appears stated age, no acute distress Head: Normocephalic, without obvious abnormality Neck: no adenopathy, thyroid {EXAM; THYROID:18604} Lungs: clear to auscultation bilaterally Breasts: {Exam; breast:13139::"normal appearance, no masses or tenderness"} Heart: regular rate and rhythm Abdomen: soft, non-tender; no masses,  no organomegaly Extremities: extremities normal, no edema Skin: No rashes or lesions Lymph nodes: Cervical, supraclavicular, and axillary nodes normal. No abnormal inguinal nodes palpated Neurologic: Grossly normal   Pelvic: External genitalia:  no lesions              Urethra:  normal appearing urethra with no masses, tenderness or lesions              Bartholins and Skenes: normal                 Vagina: normal appearing vagina, appropriate for age, normal appearing discharge, no lesions              Cervix: neg cervical motion tenderness, no visible lesions             Bimanual Exam:   Uterus:  {exam; uterus:12215}              Adnexa: {exam; adnexa:12223}                 ***, CMA Chaperone was present for exam.  A:  Well Woman with normal  exam  P:   Pap :  Mammogram:  Labs:  Medications:

## 2021-03-23 ENCOUNTER — Ambulatory Visit: Payer: 59 | Admitting: Nurse Practitioner

## 2021-03-24 NOTE — Telephone Encounter (Signed)
Medication denied by [facility] healthcare for generic vagifem 10 mcg tablet. Patient will need to try and fail formulary medication such as estradiol vaginal cream,estring,estradiol oral tablet, estradiol transdermal patches or menest tablets.   Pharmacy informed of this as well.

## 2021-03-26 ENCOUNTER — Other Ambulatory Visit: Payer: Self-pay | Admitting: Family Medicine

## 2021-03-28 ENCOUNTER — Other Ambulatory Visit: Payer: Self-pay | Admitting: Family Medicine

## 2021-04-01 ENCOUNTER — Ambulatory Visit: Payer: 59 | Admitting: Obstetrics & Gynecology

## 2021-04-09 ENCOUNTER — Telehealth: Payer: Self-pay

## 2021-04-09 ENCOUNTER — Other Ambulatory Visit: Payer: Self-pay | Admitting: Family Medicine

## 2021-04-09 ENCOUNTER — Other Ambulatory Visit: Payer: Self-pay

## 2021-04-09 ENCOUNTER — Ambulatory Visit (INDEPENDENT_AMBULATORY_CARE_PROVIDER_SITE_OTHER): Payer: 59 | Admitting: Otolaryngology

## 2021-04-09 VITALS — Temp 96.6°F

## 2021-04-09 DIAGNOSIS — R053 Chronic cough: Secondary | ICD-10-CM

## 2021-04-09 DIAGNOSIS — E041 Nontoxic single thyroid nodule: Secondary | ICD-10-CM

## 2021-04-09 DIAGNOSIS — K219 Gastro-esophageal reflux disease without esophagitis: Secondary | ICD-10-CM

## 2021-04-09 DIAGNOSIS — R059 Cough, unspecified: Secondary | ICD-10-CM

## 2021-04-09 NOTE — Telephone Encounter (Signed)
Pt called stating she needed to speak to Dr. Frederik Pear nurse regarding her ENT visit she just had today.  Pt would like a call back from [place].

## 2021-04-09 NOTE — Progress Notes (Signed)
HPI: [name] is a 50 y.o. female who presents is referred by by her PCP Dr. Charlett Blake for evaluation of chronic cough that she has had for over a year..  She describes a intermittent dry cough that occurs especially when she is active.  It occasionally occurs at night.  She is not sure what causes the cough. She was tried on Pepcid at night when she goes to bed for couple months but this did not seem to help. She denies any hoarseness.  No trouble swallowing. She does not smoke. She does have some mild allergies but does not see an allergist.  She denies any trouble breathing through her nose although she has tried nasal steroid sprays. During conversation in the office today she had no coughing during our visit.  Past Medical History:  Diagnosis Date   Allergy    Alopecia    Anxiety and depression 07/31/2011   Chest pain 09/22/10   Sharp pains in left breast & sometimes in the right breast as well. Random in occurence & nonexertional. Also has occasional SOB & DOE when going up stairs & occasionally when working out. Nuclear Stress Treadmill 09/29/10 -    HSV-2 (herpes simplex virus 2) infection    IBS (irritable bowel syndrome)    Interstitial cystitis    Multiple allergies 07/02/2011   Neuromuscular disorder ([place])    past hx dx of Fibromyalgia   Otitis media 03/16/2012   Perimenopausal 07/22/2013   PONV (postoperative nausea and vomiting)    Poor concentration 07/02/2011   Shoulder pain, left 04/11/2012   SOB (shortness of breath) 09/22/10   ECHO 09/29/10 - NL LVF, trivial PE, trivial TR/PR.   UTI (lower urinary tract infection) 08/28/2011   Past Surgical History:  Procedure Laterality Date   BREAST BIOPSY Right 03/12/2016   Procedure: breast exploration;  Surgeon: Jackolyn Confer, MD;  Location: [place];  Service: General;  Laterality: Right;   BREAST ENHANCEMENT SURGERY  2007   CESAREAN SECTION  2001 and 2004   x2   COLONOSCOPY  2007   LAPAROSCOPY      twice   RHINOPLASTY  2003   TONSILLECTOMY  1982   TUBAL LIGATION     UPPER GASTROINTESTINAL ENDOSCOPY  2011   Social History   Socioeconomic History   Marital status: Divorced    Spouse name: Not on file   Number of children: Not on file   Years of education: Not on file   Highest education level: Not on file  Occupational History   Not on file  Tobacco Use   Smoking status: Never   Smokeless tobacco: Never  Vaping Use   Vaping Use: Never used  Substance and Sexual Activity   Alcohol use: Not Currently    Alcohol/week: 0.0 standard drinks   Drug use: Never   Sexual activity: Yes    Partners: Male    Birth control/protection: Surgical    Comment: Tubal lig-1st intercourse 50 yo-More than 5 partners  Other Topics Concern   Not on file  Social History Narrative   Not on file   Social Determinants of Health   Financial Resource Strain: Not on file  Food Insecurity: Not on file  Transportation Needs: Not on file  Physical Activity: Not on file  Stress: Not on file  Social Connections: Not on file   Family History  Problem Relation Age of Onset   Heart disease Mother        MVP  Anxiety disorder Mother    Mitral valve prolapse Mother    Alzheimer's disease Mother    Colon polyps Mother    Hypertension Brother    Obesity Brother    Diabetes Brother        DM, obese   Allergies Daughter    Allergies Son    Kidney disease Maternal Grandmother        uremia   Breast cancer Neg Hx    Colon cancer Neg Hx    Esophageal cancer Neg Hx    Rectal cancer Neg Hx    Stomach cancer Neg Hx    No Known Allergies Prior to Admission medications   Medication Sig Start Date End Date Taking? Authorizing Provider  albuterol (VENTOLIN HFA) 108 (90 Base) MCG/ACT inhaler TAKE 2 PUFFS BY MOUTH EVERY 6 HOURS AS NEEDED FOR WHEEZE OR SHORTNESS OF BREATH 03/31/21   Mosie Lukes, MD  amphetamine-dextroamphetamine (ADDERALL) 20 MG tablet Take 1 tablet (20 mg total) by mouth 2  (two) times daily. 03/05/21   Mosie Lukes, MD  cetirizine (ZYRTEC) 10 MG tablet Take 1 tablet (10 mg total) by mouth 2 (two) times daily as needed for allergies. 12/22/20   Mosie Lukes, MD  Cholecalciferol 50 MCG (2000 UT) TABS Take 1 tablet by mouth daily.    [provider]  estradiol (ESTRACE) 0.5 MG tablet Take 2 tablets (1 mg total) by mouth daily. 03/17/21   Karma Ganja, NP  Estradiol (VAGIFEM) 10 MCG TABS vaginal tablet Place 1 tablet (10 mcg total) vaginally 2 (two) times a week. 03/19/21   Karma Ganja, NP  famotidine (PEPCID) 40 MG tablet TAKE 1 TABLET BY MOUTH EVERYDAY AT BEDTIME 03/26/21   Mosie Lukes, MD  fexofenadine (ALLEGRA) 180 MG tablet Take 180 mg by mouth daily.      [provider]  finasteride (PROSCAR) 5 MG tablet TAKE 1 TABLET BY MOUTH EVERY DAY 03/17/21   Mosie Lukes, MD  flunisolide (NASALIDE) 25 MCG/ACT (0.025%) SOLN PLEASE SPECIFY DIRECTIONS, REFILLS AND QUANTITY 09/04/20   Mosie Lukes, MD  Nirmatrelvir & Ritonavir 20 x 150 MG & 10 x 100MG  TBPK Take 2 tablets of Nirmatrelvir and 1 tablet of Ritonavir together in the morning and at bedtime for 5 days. Patient not taking: Reported on 03/12/2021 02/23/21   Saguier, Percell Miller, PA-C  progesterone (PROMETRIUM) 100 MG capsule Take 1 capsule (100 mg total) by mouth at bedtime. 03/17/21   Karma Ganja, NP  Sennosides-Docusate Sodium (SM NATURAL LAXATIVE/STOOL SOFT PO) Take 1 tablet by mouth. Patient not taking: Reported on 03/12/2021    [provider]  Spacer/Aero-Holding Chambers (AEROCHAMBER MINI CHAMBER) DEVI Use as needed with albuterol [facility] 03/05/21   Mosie Lukes, MD  spironolactone (ALDACTONE) 100 MG tablet Take 100 mg by mouth daily.    [provider]  tretinoin (RETIN-A) 0.025 % cream  07/26/11   [provider]  tretinoin (RETIN-A) 0.05 % cream Apply topically. 02/10/21   [provider]  UNABLE TO FIND cocovia    [provider]  valACYclovir  (VALTREX) 500 MG tablet Take 1 tablet (500 mg total) by mouth daily. 09/04/20   Mosie Lukes, MD  XIIDRA 5 % SOLN  01/04/21   [provider]     Positive ROS: Otherwise negative  All other systems have been reviewed and were otherwise negative with the exception of those mentioned in the HPI and as above.  Physical Exam: Constitutional: Alert, well-appearing, no  acute distress Ears: External ears without lesions or tenderness. Ear canals are clear bilaterally with intact, clear TMs.  Nasal: External nose without lesions. Septum mildly deviated to the right.. Clear nasal passages otherwise.  Both middle meatus regions were clear. Oral: Lips and gums without lesions. Tongue and palate mucosa without lesions. Posterior oropharynx clear. Fiberoptic laryngoscopy was performed through the left nostril.  The middle meatus region was clear.  The nasopharynx was clear.  The base of tongue vallecula and epiglottis were normal.  The vocal cords were clear bilaterally with normal vocal cord mobility.  Piriform sinuses were clear.  She had mild arytenoid edema but no erythema and no supraglottic mucus noted. Neck: No palpable adenopathy on either side of the neck.  However she did have a easily palpable right thyroid nodule with no palpable thyroid nodule on the left side.  No supraclavicular adenopathy noted. Respiratory: Breathing comfortably  Skin: No facial/neck lesions or rash noted.  Laryngoscopy  Date/Time: 04/09/2021 2:57 PM Performed by: Rozetta Nunnery, MD Authorized by: Rozetta Nunnery, MD   Consent:    Consent obtained:  Verbal   Consent given by:  Patient Procedure details:    Indications: assessment of airway     Medication:  Afrin   Instrument: flexible fiberoptic laryngoscope     Scope location: left nare   Septum:    Deviation: deviated to the right and anterior deviation   Sinus:    Left nasopharynx: normal   Mouth:    Oropharynx: normal     Vallecula:  normal     Base of tongue: normal     Epiglottis: normal   Throat:    Pyriform sinus: normal     True vocal cords: normal   Comments:     On fiberoptic laryngoscopy the vocal cords were clear bilaterally with normal vocal mobility.  She had mild edema of the arytenoid mucosa but no mucosal abnormalities noted  Assessment: Chronic intermittent cough over the past year.  Questionable etiology.  Could possibly be related to reflux symptoms as she does have some mild edema of the arytenoid mucosa on fiberoptic laryngoscopy. Right thyroid nodule.  Plan: Prescribed omeprazole 40 mg daily before dinner to see if this helps at all with her chronic cough over the next month. We will plan on scheduling a ultrasound of her thyroid to evaluate the right thyroid nodule. Patient will call us back following the ultrasound of the thyroid concerning results. If the cough persists following 3 to 4 weeks of omeprazole use consider further evaluation with pulmonary.   Radene Journey, MD   CC:

## 2021-04-10 ENCOUNTER — Other Ambulatory Visit (INDEPENDENT_AMBULATORY_CARE_PROVIDER_SITE_OTHER): Payer: 59

## 2021-04-10 DIAGNOSIS — E041 Nontoxic single thyroid nodule: Secondary | ICD-10-CM | POA: Diagnosis not present

## 2021-04-10 LAB — TSH: TSH: 1.33 u[IU]/mL (ref 0.35–4.50)

## 2021-04-10 LAB — T4, FREE: Free T4: 0.87 ng/dL (ref 0.60–1.60)

## 2021-04-10 NOTE — Telephone Encounter (Signed)
Spoke with pt, she is scheduled for ultrasound Monday, pt wanted labs to go to [facility]. So I sent them there, and she is aware that someone will call to schedule for pulmonary.

## 2021-04-10 NOTE — Addendum Note (Signed)
Addended by: Alinda Deem L on: 04/10/2021 10:13 AM   Modules accepted: Orders

## 2021-04-13 ENCOUNTER — Other Ambulatory Visit: Payer: Self-pay

## 2021-04-13 ENCOUNTER — Ambulatory Visit (HOSPITAL_BASED_OUTPATIENT_CLINIC_OR_DEPARTMENT_OTHER)
Admission: RE | Admit: 2021-04-13 | Discharge: 2021-04-13 | Disposition: A | Discharge status: Home / Self Care | Payer: 59 | Source: Ambulatory Visit | Attending: Family Medicine | Admitting: Family Medicine

## 2021-04-13 DIAGNOSIS — E041 Nontoxic single thyroid nodule: Secondary | ICD-10-CM | POA: Diagnosis present

## 2021-04-13 DIAGNOSIS — R059 Cough, unspecified: Secondary | ICD-10-CM | POA: Diagnosis not present

## 2021-04-14 ENCOUNTER — Other Ambulatory Visit: Payer: Self-pay | Admitting: Family Medicine

## 2021-04-14 DIAGNOSIS — E042 Nontoxic multinodular goiter: Secondary | ICD-10-CM

## 2021-04-15 ENCOUNTER — Telehealth: Payer: Self-pay | Admitting: Family Medicine

## 2021-04-15 ENCOUNTER — Other Ambulatory Visit: Payer: Self-pay | Admitting: Family Medicine

## 2021-04-15 ENCOUNTER — Encounter: Payer: Self-pay | Admitting: Family Medicine

## 2021-04-15 DIAGNOSIS — E042 Nontoxic multinodular goiter: Secondary | ICD-10-CM

## 2021-04-15 NOTE — Telephone Encounter (Signed)
Patient would like to discuss lab results.

## 2021-04-15 NOTE — Telephone Encounter (Signed)
Spoke with pt and gave her the number to interventional radiology [phone]

## 2021-04-16 ENCOUNTER — Ambulatory Visit
Admission: RE | Admit: 2021-04-16 | Discharge: 2021-04-16 | Disposition: A | Discharge status: Home / Self Care | Payer: 59 | Source: Ambulatory Visit | Attending: Family Medicine | Admitting: Family Medicine

## 2021-04-16 ENCOUNTER — Other Ambulatory Visit (HOSPITAL_COMMUNITY)
Admission: RE | Admit: 2021-04-16 | Discharge: 2021-04-16 | Disposition: A | Discharge status: Home / Self Care | Payer: 59 | Source: Ambulatory Visit | Attending: Family Medicine | Admitting: Family Medicine

## 2021-04-16 DIAGNOSIS — E041 Nontoxic single thyroid nodule: Secondary | ICD-10-CM | POA: Insufficient documentation

## 2021-04-16 DIAGNOSIS — E042 Nontoxic multinodular goiter: Secondary | ICD-10-CM

## 2021-04-17 ENCOUNTER — Encounter: Payer: Self-pay | Admitting: Family Medicine

## 2021-04-17 LAB — CYTOLOGY - NON PAP

## 2021-04-21 ENCOUNTER — Ambulatory Visit
Admission: RE | Admit: 2021-04-21 | Discharge: 2021-04-21 | Disposition: A | Discharge status: Home / Self Care | Payer: 59 | Source: Ambulatory Visit | Attending: Obstetrics & Gynecology | Admitting: Obstetrics & Gynecology

## 2021-04-21 ENCOUNTER — Other Ambulatory Visit: Payer: Self-pay

## 2021-04-21 DIAGNOSIS — Z1231 Encounter for screening mammogram for malignant neoplasm of breast: Secondary | ICD-10-CM

## 2021-04-24 ENCOUNTER — Other Ambulatory Visit: Payer: Self-pay | Admitting: Family Medicine

## 2021-04-24 DIAGNOSIS — E042 Nontoxic multinodular goiter: Secondary | ICD-10-CM

## 2021-05-14 ENCOUNTER — Ambulatory Visit (INDEPENDENT_AMBULATORY_CARE_PROVIDER_SITE_OTHER): Payer: 59

## 2021-05-14 ENCOUNTER — Other Ambulatory Visit: Payer: Self-pay

## 2021-05-14 ENCOUNTER — Ambulatory Visit (INDEPENDENT_AMBULATORY_CARE_PROVIDER_SITE_OTHER): Payer: 59 | Admitting: Obstetrics & Gynecology

## 2021-05-14 ENCOUNTER — Encounter: Payer: Self-pay | Admitting: Obstetrics & Gynecology

## 2021-05-14 VITALS — BP 90/60 | HR 61 | Resp 14

## 2021-05-14 DIAGNOSIS — N951 Menopausal and female climacteric states: Secondary | ICD-10-CM | POA: Diagnosis not present

## 2021-05-14 DIAGNOSIS — R102 Pelvic and perineal pain: Secondary | ICD-10-CM

## 2021-05-14 DIAGNOSIS — N941 Unspecified dyspareunia: Secondary | ICD-10-CM

## 2021-05-14 DIAGNOSIS — N952 Postmenopausal atrophic vaginitis: Secondary | ICD-10-CM | POA: Diagnosis not present

## 2021-05-14 NOTE — Progress Notes (Signed)
    [name] 09/16/1971 [id number]        50 y.o.  [id number]   RP: Pelvic pain and dyspareunia for pelvic ultrasound  HPI: Pelvic pain and dyspareunia for pelvic ultrasound.  Patient is postmenopausal on no hormone replacement therapy.  No postmenopausal bleeding.  No abnormal vaginal discharge.  No fever.  Urine and bowel movements normal.    OB History  Gravida Para Term Preterm AB Living  3 2 2   1 2   SAB IAB Ectopic Multiple Live Births               # Outcome Date GA Lbr Len/2nd Weight Sex Delivery Anes PTL Lv  3 Term           2 Term           1 AB             Past medical history,surgical history, problem list, medications, allergies, family history and social history were all reviewed and documented in the EPIC chart.   Directed ROS with pertinent positives and negatives documented in the history of present illness/assessment and plan.  Exam:  Vitals:   05/14/21 0840  BP: 90/60  Pulse: 61  Resp: 14   General appearance:  Normal  Pelvic ultrasound today: T/V images.  Anteverted uterus normal in size and shape with fibroids, the largest of which is measured at 1 cm and is intramural.  The overall uterine size is measured at 8 cm x 4.11 x 3.27 cm.  The endometrial lining is symmetrical with a sliver of fluid and no mass or thickening seen.  The endometrial lining is measured at 2.68 cm.  Both ovaries are normal in size with a right ovary showing a thick walled residual follicle measured at 5 mm.  The left ovary has a residual follicle measured at 4 mm.  No adnexal mass.  No free fluid in the posterior cul-de-sac.   Assessment/Plan:  50 y.o. [id number]   1. Pelvic pain Pelvic ultrasound findings thoroughly reviewed with patient.  Patient reassured that the overall size of your uterus is normal and the fibroids are very small.  The endometrial lining is normal.  Both ovaries are normal.  No free fluid is present.  Pelvic pain probably associated with intestinal  function.  Nutritional recommendation discussed.  Patient will follow-up with family physician and gastroenterologist as needed.  2. Menopausal symptoms Menopausal symptoms present.  Will try natural estrogen such as black cohosh or Estroven.  If worsening of symptoms, will schedule an appointment to discuss management.  3. Dyspareunia, female  Will try first with coconut oil.  4. Postmenopausal atrophic vaginitis  As above, will try coconut oil.  If not enough will consider topical estradiol vaginally.  Princess Bruins MD, 9:02 AM 05/14/2021

## 2021-05-17 ENCOUNTER — Encounter: Payer: Self-pay | Admitting: Obstetrics & Gynecology

## 2021-05-19 ENCOUNTER — Ambulatory Visit (INDEPENDENT_AMBULATORY_CARE_PROVIDER_SITE_OTHER): Payer: 59 | Admitting: Pulmonary Disease

## 2021-05-19 ENCOUNTER — Encounter: Payer: Self-pay | Admitting: *Deleted

## 2021-05-19 ENCOUNTER — Telehealth: Payer: Self-pay | Admitting: *Deleted

## 2021-05-19 ENCOUNTER — Other Ambulatory Visit: Payer: Self-pay

## 2021-05-19 ENCOUNTER — Encounter: Payer: Self-pay | Admitting: Pulmonary Disease

## 2021-05-19 VITALS — BP 80/60 | HR 73 | Ht 66.0 in | Wt 129.2 lb

## 2021-05-19 DIAGNOSIS — R053 Chronic cough: Secondary | ICD-10-CM

## 2021-05-19 NOTE — Progress Notes (Addendum)
C   Subjective:   PATIENT ID: Armandina Stammer GENDER: female DOB: 1970-12-31, MRN: [id number]   HPI  Chief Complaint  Patient presents with   Consult    Coughing causes mucus sometime to come but goes back. Doing active things makes it worse drinking water makes it better. Lost inhaler so patient was unable to try it.    Reason for Visit: New consult for chronic cough  Ms. Makyiah Lie is a 50 year old female with allergic rhinitis who presents as a new consult for chronic cough.  She reports unproductive cough that began at least one year ago. No infection preceding this. Has noticed with exercising. Drinking water improves. Does not have a clear season association. Denies wheezing. She will wake up due to cough 1-2 times a week. Reports dog allergy. Does not cough while speaking  She was previously seen by ENT for chronic cough. Reviewed 04/09/21 note by Dr. Lucia Gaskins. Tried Pepcid and omeprazole without relief. She does report runny nose. Previously on nasal spray but did not feel a difference.  Denies history of asthma or chronic respiratory conditions  Social History: Indoor at - 8 years.  Career speaker/TV personality  I have personally reviewed patient's past medical/family/social history, allergies, current medications.  Past Medical History:  Diagnosis Date   Allergy    Alopecia    Anxiety and depression 07/31/2011   Chest pain 09/22/10   Sharp pains in left breast & sometimes in the right breast as well. Random in occurence & nonexertional. Also has occasional SOB & DOE when going up stairs & occasionally when working out. Nuclear Stress Treadmill 09/29/10 -    HSV-2 (herpes simplex virus 2) infection    IBS (irritable bowel syndrome)    Interstitial cystitis    Multiple allergies 07/02/2011   Neuromuscular disorder ([place])    past hx dx of Fibromyalgia   Otitis media 03/16/2012   Perimenopausal 07/22/2013   PONV (postoperative nausea and vomiting)    Poor  concentration 07/02/2011   Shoulder pain, left 04/11/2012   SOB (shortness of breath) 09/22/10   ECHO 09/29/10 - NL LVF, trivial PE, trivial TR/PR.   UTI (lower urinary tract infection) 08/28/2011     Family History  Problem Relation Age of Onset   Heart disease Mother        MVP   Anxiety disorder Mother    Mitral valve prolapse Mother    Alzheimer's disease Mother    Colon polyps Mother    Hypertension Brother    Obesity Brother    Diabetes Brother        DM, obese   Allergies Daughter    Allergies Son    Kidney disease Maternal Grandmother        uremia   Breast cancer Neg Hx    Colon cancer Neg Hx    Esophageal cancer Neg Hx    Rectal cancer Neg Hx    Stomach cancer Neg Hx      Social History   Occupational History   Not on file  Tobacco Use   Smoking status: Never   Smokeless tobacco: Never  Vaping Use   Vaping Use: Never used  Substance and Sexual Activity   Alcohol use: Not Currently    Alcohol/week: 0.0 standard drinks   Drug use: Never   Sexual activity: Yes    Partners: Male    Birth control/protection: Surgical    Comment: Tubal lig-1st intercourse 50 yo-More than 5 partners  No Known Allergies   Outpatient Medications Prior to Visit  Medication Sig Dispense Refill   amphetamine-dextroamphetamine (ADDERALL) 20 MG tablet Take 1 tablet (20 mg total) by mouth 2 (two) times daily. 60 tablet 0   cetirizine (ZYRTEC) 10 MG tablet Take 1 tablet (10 mg total) by mouth 2 (two) times daily as needed for allergies. 60 tablet 2   Cholecalciferol 50 MCG (2000 UT) TABS Take 1 tablet by mouth daily.     estradiol (ESTRACE) 0.5 MG tablet Take 2 tablets (1 mg total) by mouth daily. 180 tablet 4   famotidine (PEPCID) 40 MG tablet TAKE 1 TABLET BY MOUTH EVERYDAY AT BEDTIME 90 tablet 1   fexofenadine (ALLEGRA) 180 MG tablet Take 180 mg by mouth daily.       finasteride (PROSCAR) 5 MG tablet TAKE 1 TABLET BY MOUTH EVERY DAY 90 tablet 1   flunisolide (NASALIDE) 25  MCG/ACT (0.025%) SOLN PLEASE SPECIFY DIRECTIONS, REFILLS AND QUANTITY 75 mL 11   progesterone (PROMETRIUM) 100 MG capsule Take 1 capsule (100 mg total) by mouth at bedtime. 90 capsule 4   Spacer/Aero-Holding Chambers (AEROCHAMBER MINI CHAMBER) DEVI Use as needed with albuterol HFA 1 each 0   spironolactone (ALDACTONE) 100 MG tablet Take 100 mg by mouth daily.     tretinoin (RETIN-A) 0.05 % cream Apply topically.     UNABLE TO FIND cocovia     valACYclovir (VALTREX) 500 MG tablet Take 1 tablet (500 mg total) by mouth daily. 90 tablet 3   XIIDRA 5 % SOLN      albuterol (VENTOLIN HFA) 108 (90 Base) MCG/ACT inhaler TAKE 2 PUFFS BY MOUTH EVERY 6 HOURS AS NEEDED FOR WHEEZE OR SHORTNESS OF BREATH (Patient not taking: Reported on 05/19/2021) 18 each 1   No facility-administered medications prior to visit.    Review of Systems  Constitutional:  Negative for chills, diaphoresis, fever, malaise/fatigue and weight loss.  HENT:  Positive for congestion.   Respiratory:  Positive for cough. Negative for hemoptysis, sputum production, shortness of breath and wheezing.   Cardiovascular:  Negative for chest pain, palpitations and leg swelling.    Objective:   Vitals:   05/19/21 1141  BP: (!) 80/60  Pulse: 73  SpO2: 97%  Weight: 129 lb 3.2 oz (58.6 kg)  Height: '5\' 6"'  (1.676 m)   SpO2: 97 %  Physical Exam: General: Well-appearing, no acute distress HENT: [place], AT Eyes: EOMI, no scleral icterus Respiratory: Clear to auscultation bilaterally.  No crackles, wheezing or rales Cardiovascular: RRR, -M/R/G, no JVD Extremities:-Edema,-tenderness Neuro: AAO x4, CNII-XII grossly intact Psych: Normal mood, normal affect  Data Reviewed:  Imaging: CXR 01/05/21 - No infiltrate effusion or edema  PFT: None on file at time of visit  Labs: CBC    Component Value Date/Time   WBC 5.5 09/05/2020 1012   RBC 4.70 09/05/2020 1012   HGB 13.9 09/05/2020 1012   HCT 42.3 09/05/2020 1012   PLT 238.0 09/05/2020  1012   MCV 90.1 09/05/2020 1012   MCH 30.4 12/25/2018 1441   MCHC 32.9 09/05/2020 1012   RDW 13.4 09/05/2020 1012   LYMPHSABS 1,767 12/25/2018 1441   MONOABS 246 12/21/2016 1111   EOSABS 31 12/25/2018 1441   BASOSABS 37 12/25/2018 1441   Absolute eos 12/25/18 - 31     Assessment & Plan:   Discussion: 50 year old female with chronic cough >1 year of unknown etiology. Multiple causes for cough are possible including uncontrolled asthma, acid reflux and post-nasal drainage.  Chronic cough --SCHEDULE pulmonary function tests for next available --If neg, will consider additional work-up including CT Chest or trial of bronchodilators   Health Maintenance Immunization History  Administered Date(s) Administered   Hepatitis A 10/21/1998, 03/07/2010   Hepatitis B 07/22/1998, 09/08/1998, 01/19/1999   Influenza Inj Mdck Quad Pf 07/29/2019   Influenza Split 07/31/2011   Influenza,inj,Quad PF,6+ Mos 07/20/2013, 07/05/2014, 07/16/2015, 06/24/2016, 07/25/2017, 09/08/2018, 08/17/2020   Influenza-Unspecified 03/31/2010   MMR 03/14/2018   PFIZER(Purple Top)SARS-COV-2 Vaccination 02/03/2020, 03/02/2020, 10/02/2020   Td 07/06/2005   Tdap 07/06/2005, 09/07/2013   Typhoid Inactivated 03/31/2010   CT Lung Screen - not qualified. Never smoker  Orders Placed This Encounter  Procedures   Pulmonary function test    Standing Status:   Future    Number of Occurrences:   1    Standing Expiration Date:   05/19/2022    Order Specific Question:   Where should this test be performed?    Answer:   [facility]    Order Specific Question:   Full PFT: includes the following: basic spirometry, spirometry pre & post bronchodilator, diffusion capacity (DLCO), lung volumes    Answer:   Full PFT  No orders of the defined types were placed in this encounter.   No follow-ups on file.   [place], MD [facility] 05/19/2021 12:04 PM  Office Number [phone]

## 2021-05-19 NOTE — Telephone Encounter (Signed)
-----   Message from Princess Bruins, MD sent at 05/14/2021  9:03 AM EDT ----- Regarding: Letter of medical necessity Insurance denied coverage for the generic of Vagifem.  Please write a letter of necessity for severe dyspareunia/Postmenopausal atrophic vaginitis

## 2021-05-19 NOTE — Telephone Encounter (Signed)
Appeal letter faxed to [phone] to appeals department. Will wait to hear from them.

## 2021-05-19 NOTE — Patient Instructions (Signed)
Chronic cough --SCHEDULE pulmonary function tests for next available --If neg, will consider additional work-up including CT Chest or trial of bronchodilators   Follow-up with NP after PFTs

## 2021-05-20 NOTE — Telephone Encounter (Signed)
Dr.Lavoie the letter was denied by [employer] for generic vagifem 10 mcg patient will need to try/fail other alternative medications such as estradiol vaginal cream,estring. Once she has tried and failed both medication the insurance may approve generic Vagifem.   Would any of the listed medications be option for patient?

## 2021-05-21 ENCOUNTER — Ambulatory Visit (INDEPENDENT_AMBULATORY_CARE_PROVIDER_SITE_OTHER): Payer: 59 | Admitting: Internal Medicine

## 2021-05-21 ENCOUNTER — Encounter: Payer: Self-pay | Admitting: Internal Medicine

## 2021-05-21 ENCOUNTER — Other Ambulatory Visit: Payer: Self-pay

## 2021-05-21 VITALS — BP 108/68 | HR 78 | Ht 66.0 in | Wt 128.0 lb

## 2021-05-21 DIAGNOSIS — E042 Nontoxic multinodular goiter: Secondary | ICD-10-CM | POA: Diagnosis not present

## 2021-05-21 NOTE — Patient Instructions (Signed)
-   Please make sure your thyroid nodule biopsy is schedule after 9/16th

## 2021-05-21 NOTE — Progress Notes (Signed)
Name: [name]  MRN/ DOB: [id number], 26-Jul-1971    Age/ Sex: 50 y.o., female    PCP: Mosie Lukes, MD   Reason for Endocrinology Evaluation: MNG     Date of Initial Endocrinology Evaluation: 05/21/2021     HPI: [name] is a 50 y.o. female with a past medical history of ADHD, depression and anxiety. The patient presented for initial endocrinology clinic visit on 05/21/2021 for consultative assistance with her MNG.     She was noted to have multinodular goiter on thyroid ultrasound dated 04/13/2021, she was having cough at the time.  2 of the nodules have met criteria for FNA, which were performed on 04/16/2021  Right inferior nodule 2.6 cm cytology report consistent with scant cellularity Left inferior nodule FNA 2.7 cm, showed benign cytology  Denies local neck swelling  Has noted occasional difficulty swallowing as well as hoarseness   No prior exposure to radiation    No Fh of thyroid disease      HISTORY:  Past Medical History:  Past Medical History:  Diagnosis Date  . Allergy   . Alopecia   . Anxiety and depression 07/31/2011  . Chest pain 09/22/10   Sharp pains in left breast & sometimes in the right breast as well. Random in occurence & nonexertional. Also has occasional SOB & DOE when going up stairs & occasionally when working out. Nuclear Stress Treadmill 09/29/10 -   . HSV-2 (herpes simplex virus 2) infection   . IBS (irritable bowel syndrome)   . Interstitial cystitis   . Multiple allergies 07/02/2011  . Neuromuscular disorder ([place])    past hx dx of Fibromyalgia  . Otitis media 03/16/2012  . Perimenopausal 07/22/2013  . PONV (postoperative nausea and vomiting)   . Poor concentration 07/02/2011  . Shoulder pain, left 04/11/2012  . SOB (shortness of breath) 09/22/10   ECHO 09/29/10 - NL LVF, trivial PE, trivial TR/PR.  Marland Kitchen UTI (lower urinary tract infection) 08/28/2011   Past Surgical History:  Past Surgical History:   Procedure Laterality Date  . BREAST BIOPSY Right 03/12/2016   Procedure: breast exploration;  Surgeon: Jackolyn Confer, MD;  Location: [facility];  Service: General;  Laterality: Right;  . BREAST ENHANCEMENT SURGERY  2007  . CESAREAN SECTION  2001 and 2004   x2  . COLONOSCOPY  2007  . LAPAROSCOPY     twice  . RHINOPLASTY  2003  . TONSILLECTOMY  1982  . TUBAL LIGATION    . UPPER GASTROINTESTINAL ENDOSCOPY  2011    Social History:  reports that she has never smoked. She has never used smokeless tobacco. She reports previous alcohol use. She reports that she does not use drugs. Family History: family history includes Allergies in her daughter and son; Alzheimer's disease in her mother; Anxiety disorder in her mother; Colon polyps in her mother; Diabetes in her brother; Heart disease in her mother; Hypertension in her brother; Kidney disease in her maternal grandmother; Mitral valve prolapse in her mother; Obesity in her brother.   HOME MEDICATIONS: Allergies as of 05/21/2021   No Known Allergies      Medication List        Accurate as of May 21, 2021  8:58 AM. If you have any questions, ask your nurse or doctor.          AeroChamber [employer] Use as needed with albuterol HFA   albuterol 108 (90 Base) MCG/ACT inhaler Commonly known  as: VENTOLIN HFA TAKE 2 PUFFS BY MOUTH EVERY 6 HOURS AS NEEDED FOR WHEEZE OR SHORTNESS OF BREATH   amphetamine-dextroamphetamine 20 MG tablet Commonly known as: ADDERALL Take 1 tablet (20 mg total) by mouth 2 (two) times daily.   cetirizine 10 MG tablet Commonly known as: ZYRTEC Take 1 tablet (10 mg total) by mouth 2 (two) times daily as needed for allergies.   Cholecalciferol 50 MCG (2000 UT) Tabs Take 1 tablet by mouth daily.   estradiol 0.5 MG tablet Commonly known as: ESTRACE Take 2 tablets (1 mg total) by mouth daily.   famotidine 40 MG tablet Commonly known as: PEPCID TAKE 1 TABLET BY MOUTH EVERYDAY AT  BEDTIME   fexofenadine 180 MG tablet Commonly known as: ALLEGRA Take 180 mg by mouth daily.   finasteride 5 MG tablet Commonly known as: PROSCAR TAKE 1 TABLET BY MOUTH EVERY DAY   flunisolide 25 MCG/ACT (0.025%) Soln Commonly known as: NASALIDE PLEASE SPECIFY DIRECTIONS, REFILLS AND QUANTITY   progesterone 100 MG capsule Commonly known as: PROMETRIUM Take 1 capsule (100 mg total) by mouth at bedtime.   spironolactone 100 MG tablet Commonly known as: ALDACTONE Take 100 mg by mouth daily.   tretinoin 0.05 % cream Commonly known as: RETIN-A Apply topically.   UNABLE TO FIND cocovia   valACYclovir 500 MG tablet Commonly known as: VALTREX Take 1 tablet (500 mg total) by mouth daily.   Xiidra 5 % Soln Generic drug: Lifitegrast          REVIEW OF SYSTEMS: A comprehensive ROS was conducted with the patient and is negative except as per HPI     OBJECTIVE:  VS: BP 108/68   Pulse 78   Ht '5\' 6"'  (1.676 m)   Wt 128 lb (58.1 kg)   LMP 07/03/2015   SpO2 98%   BMI 20.66 kg/m    Wt Readings from Last 3 Encounters:  05/21/21 128 lb (58.1 kg)  05/19/21 129 lb 3.2 oz (58.6 kg)  03/12/21 129 lb (58.5 kg)     EXAM: General: Pt appears well and is in NAD  Neck: General: Supple without adenopathy. Thyroid: Thyroid size normal.  Left thyroid nodule appreciated, and able to palpate the right on today's exam   Lungs: Clear with good BS bilat with no rales, rhonchi, or wheezes  Heart: Auscultation: RRR.  Abdomen: Normoactive bowel sounds, soft, nontender, without masses or organomegaly palpable  Extremities:  BL LE: No pretibial edema normal ROM and strength.  Skin: Hair: Texture and amount normal with gender appropriate distribution Skin Inspection: No rashes Skin Palpation: Skin temperature, texture, and thickness normal to palpation  Neuro: Cranial nerves: II - XII grossly intact  DTRs: 2+ and symmetric in UE without delay in relaxation phase  Mental Status:  Judgment, insight: Intact Orientation: Oriented to time, place, and person Mood and affect: No depression, anxiety, or agitation     DATA REVIEWED:   Results for SATSUKI, ZILLMER (MRN [id number]) as of 05/21/2021 14:12  Ref. Range 04/10/2021 14:10  TSH Latest Ref Range: 0.35 - 4.50 uIU/mL 1.33  T4,Free(Direct) Latest Ref Range: 0.60 - 1.60 ng/dL 0.87    Thyroid ultrasound 04/13/2021  Estimated total number of nodules >/= 1 cm: 3   Number of spongiform nodules >/=  2 cm not described below (TR1): 0   Number of mixed cystic and solid nodules >/= 1.5 cm not described below ([place]): 0   _________________________________________________________   Nodule # 1:   Location: Right; Inferior  Maximum size: 2.6 cm; Other 2 dimensions: 1.7 cm x 2.0 cm   Composition: solid/almost completely solid (2)   Echogenicity: hypoechoic (2)   Shape: not taller-than-wide (0)   Margins: ill-defined (0)   Echogenic foci: none (0)   ACR TI-RADS total points: 4.   ACR TI-RADS risk category: TR4 (4-6 points).   ACR TI-RADS recommendations:   Nodule meets criteria for biopsy   _________________________________________________________   Nodule # 2:   Location: Right; Mid   Maximum size: 1.3 cm; Other 2 dimensions: 0.8 cm x 1.2 cm   Composition: mixed cystic and solid (1)   Echogenicity: hypoechoic (2)   Shape: not taller-than-wide (0)   Margins: ill-defined (0)   Echogenic foci: none (0)   ACR TI-RADS total points: 3.   ACR TI-RADS risk category: TR1 (0-1 points).   ACR TI-RADS recommendations:   Nodule does not meet criteria for surveillance or biopsy   _________________________________________________________   Nodule # 3:   Location: Left; Inferior   Maximum size: 2.7 cm; Other 2 dimensions: 1.4 cm x 1.6 cm   Composition: solid/almost completely solid (2)   Echogenicity: isoechoic (1)   Shape: not taller-than-wide (0)   Margins: ill-defined  (0)   Echogenic foci: none (0)   ACR TI-RADS total points: 3.   ACR TI-RADS risk category: TR3 (3 points).   ACR TI-RADS recommendations:   Nodule meets criteria for biopsy   _________________________________________________________   Nodule # 4:   Location: Left; Inferior   Maximum size: 0.8 cm; Other 2 dimensions: 0.7 cm x 0.7 cm   Composition: cannot determine (2)   Echogenicity: hypoechoic (2)   Shape: not taller-than-wide (0)   Margins: ill-defined (0)   Echogenic foci: none (0)   ACR TI-RADS total points: 4.   ACR TI-RADS risk category: TR4 (4-6 points).   ACR TI-RADS recommendations:   Nodule does not meet criteria for surveillance or biopsy   _________________________________________________________   No adenopathy   IMPRESSION: Multinodular thyroid.   Right inferior thyroid nodule (labeled 1, 2.6 cm, TR 4), and the left inferior thyroid nodule (labeled 3, 2.7 cm, TR 3) both meet criteria for biopsy, as designated by the newly established ACR TI-RADS criteria, and referral for biopsy is recommended.    Right inferior nodule FNA 04/16/2021  Clinical History: Nodule #1 right inferior 2.6 x 1.7 x 2.0 cm  Specimen Submitted:  A. THYROID, RIGHT INFERIOR, FINE NEEDLE ASPIRATION:    FINAL MICROSCOPIC DIAGNOSIS:  - Scant follicular epithelium present (Bethesda category I)     Left inferior nodule FNA 04/16/2021  Clinical History: Nodule #3 left inferior 2.7 x 1.4 x 1.6 cm  Specimen Submitted:  A. THYROID, LEFT INFERIOR, FINE NEEDLE ASPIRATION:    FINAL MICROSCOPIC DIAGNOSIS:  - Consistent with benign follicular nodule (Bethesda category II)   ASSESSMENT/PLAN/RECOMMENDATIONS:   Multinodular goiter  -Patient is biochemically euthyroid -We reviewed her thyroid ultrasound today, we discussed the risk of malignancy is 3 to 5% in each of the thyroid nodules. -She is status post benign FNA of the left thyroid nodule -She is also status post FNA of  the right thyroid nodule with scant cellularity Bethesda category I -I have recommended proceeding with another FNA of the right thyroid nodule in September 2022, she is in agreement that this -Orders have been placed -We discussed that the natural history of multinodular goiter is continued growth, she also understands that if FNA comes back benign she will require annual checkups and follow-ups on her  thyroid Follow-up in 1 year  Signed electronically by: Mack Guise, MD  [facility]  [facility] [address] [place], [place] [address] Phone: [phone] FAX: [phone]   CC: Mosie Lukes, [address] [place] [address] Phone: [phone] Fax: [phone]   Return to Endocrinology clinic as below: Future Appointments  Date Time Provider [place]  06/08/2021 11:00 AM [facility] None  06/08/2021 12:00 PM Par[place], Fonnie Mu, NP [facility] None  09/22/2021  2:40 PM Mosie Lukes, MD [facility]

## 2021-05-22 NOTE — Telephone Encounter (Signed)
Dr.Lavoie replied "Yes, either at patient's preference. "   My chart message sent to patient regarding below,.

## 2021-06-08 ENCOUNTER — Other Ambulatory Visit: Payer: Self-pay

## 2021-06-08 ENCOUNTER — Encounter: Payer: Self-pay | Admitting: Adult Health

## 2021-06-08 ENCOUNTER — Ambulatory Visit (INDEPENDENT_AMBULATORY_CARE_PROVIDER_SITE_OTHER): Payer: 59 | Admitting: Pulmonary Disease

## 2021-06-08 ENCOUNTER — Ambulatory Visit (INDEPENDENT_AMBULATORY_CARE_PROVIDER_SITE_OTHER): Payer: 59 | Admitting: Adult Health

## 2021-06-08 DIAGNOSIS — K219 Gastro-esophageal reflux disease without esophagitis: Secondary | ICD-10-CM

## 2021-06-08 DIAGNOSIS — R053 Chronic cough: Secondary | ICD-10-CM

## 2021-06-08 LAB — PULMONARY FUNCTION TEST
DL/VA % pred: 101 %
DL/VA: 4.29 ml/min/mmHg/L
DLCO cor % pred: 112 %
DLCO cor: 25.05 ml/min/mmHg
DLCO unc % pred: 112 %
DLCO unc: 25.05 ml/min/mmHg
FEF 25-75 Post: 1.54 L/sec
FEF 25-75 Pre: 4.9 L/sec
FEF2575-%Change-Post: -68 %
FEF2575-%Pred-Post: 54 %
FEF2575-%Pred-Pre: 175 %
FEV1-%Change-Post: -32 %
FEV1-%Pred-Post: 82 %
FEV1-%Pred-Pre: 122 %
FEV1-Post: 2.43 L
FEV1-Pre: 3.61 L
FEV1FVC-%Change-Post: -29 %
FEV1FVC-%Pred-Pre: 112 %
FEV6-%Change-Post: -4 %
FEV6-%Pred-Post: 105 %
FEV6-%Pred-Pre: 110 %
FEV6-Post: 3.83 L
FEV6-Pre: 4.01 L
FEV6FVC-%Pred-Post: 102 %
FEV6FVC-%Pred-Pre: 102 %
FVC-%Change-Post: -4 %
FVC-%Pred-Post: 102 %
FVC-%Pred-Pre: 107 %
FVC-Post: 3.83 L
FVC-Pre: 4.01 L
Post FEV1/FVC ratio: 63 %
Post FEV6/FVC ratio: 100 %
Pre FEV1/FVC ratio: 90 %
Pre FEV6/FVC Ratio: 100 %
RV % pred: 140 %
RV: 2.71 L
TLC % pred: 124 %
TLC: 6.68 L

## 2021-06-08 MED ORDER — ESTRADIOL 0.1 MG/GM VA CREA: TOPICAL_CREAM | VAGINAL | 3 refills | Status: DC

## 2021-06-08 MED ORDER — BENZONATATE 200 MG PO CAPS: 200.0000 mg | ORAL_CAPSULE | Freq: Three times a day (TID) | ORAL | 1 refills | Status: DC | PRN

## 2021-06-08 MED ORDER — ALBUTEROL SULFATE HFA 108 (90 BASE) MCG/ACT IN AERS: 1.0000 | INHALATION_SPRAY | Freq: Four times a day (QID) | RESPIRATORY_TRACT | 1 refills | Status: DC | PRN

## 2021-06-08 MED ORDER — FEXOFENADINE HCL 180 MG PO TABS: 180.0000 mg | ORAL_TABLET | Freq: Every day | ORAL | 5 refills | Status: DC

## 2021-06-08 NOTE — Progress Notes (Signed)
Full PFT performed today. °

## 2021-06-08 NOTE — Patient Instructions (Signed)
Full PFT performed today. °

## 2021-06-08 NOTE — Assessment & Plan Note (Signed)
Chronic cough for 1 year -suspect some upper airway irritation/hypersensitivity/possible reactive airways with postnasal drainage trigger. We will increase her antihistamine, add cough control regimen, trial of bronchodilator  Plan  Patient Instructions  Change Zyrtec to Allegra '180mg'$  in am  Add Chlorpheniramine '4mg'$  At bedtime As needed  drainage  (over the counter )  Deslym 2 tsp Twice daily  for cough As needed  (over the counter)  Tessalon Three times a day  for cough as needed  Albuterol Inhaler 1-2 puffs every 6hr as needed for wheezing /shortness of breath .  Follow up with Dr. Loanne Drilling or Hannan Hutmacher NP in 6 weeks and As needed  Please contact office for sooner follow up if symptoms do not improve or worsen or seek emergency care

## 2021-06-08 NOTE — Patient Instructions (Addendum)
Change Zyrtec to Allegra '180mg'$  in am  Add Chlorpheniramine '4mg'$  At bedtime As needed  drainage  (over the counter )  Deslym 2 tsp Twice daily  for cough As needed  (over the counter)  Tessalon Three times a day  for cough as needed  Albuterol Inhaler 1-2 puffs every 6hr as needed for wheezing /shortness of breath .  Follow up with Dr. Loanne Drilling or Shadai Mcclane NP in 6 weeks and As needed  Please contact office for sooner follow up if symptoms do not improve or worsen or seek emergency care

## 2021-06-08 NOTE — Addendum Note (Signed)
Addended by: Thamas Jaegers on: 06/08/2021 09:57 AM   Modules accepted: Orders

## 2021-06-08 NOTE — Assessment & Plan Note (Signed)
Continue on Pepcid at bedtime 

## 2021-06-08 NOTE — Progress Notes (Signed)
_0  ID: [name], female    DOB: 09/11/1971, 50 y.o.   MRN: [id number]  Chief Complaint  Patient presents with   Follow-up    Referring provider: Mosie Lukes, MD  HPI: 50 year old female never smoker seen for pulmonary consult May 19, 2021 for chronic cough x1 year  TEST/EVENTS :   06/08/2021 Follow up : Chronic cough  Patient returns for a 1 month follow-up.  Patient was seen last visit for an ongoing cough that has been present for the last year.  She has been tried on Zyrtec and Pepcid without much relief.  She describes as a minimally productive cough over the last year.  Patient complains of mucus collecting in the back of her throat.  She drinks water to help with this.  She had prescribed albuterol but lost the inhaler. Chest x-ray January 05, 2021 showed clear lungs.  She was set up for pulmonary function testing that showed normal lung function with no airflow obstruction or restriction and a normal diffusing capacity.  FEV1 122%, ratio 90, FVC 107%.,  No significant bronchodilator response, DLCO 112%. Patient says she is active.  Does get cough with heavier activities.  She has no significant shortness of breath.  Is able to exercise without significant limitations. She denies any hemoptysis, chest pain, palpitations, syncope, calf pain, edema. She works as a Engineer, drilling.  No known occupational exposures.  Does travel domestically.  Denies basement or hot tub.  Does not smoke.  No birds or chickens. Since last visit patient says she has noticed her cough might be slightly better but seems to come and go and is not predictable. Does complain of daily postnasal drainage despite taking Zyrtec.  No Known Allergies  Immunization History  Administered Date(s) Administered   Hepatitis A 10/21/1998, 03/07/2010   Hepatitis B 07/22/1998, 09/08/1998, 01/19/1999   Influenza Inj Mdck Quad Pf 07/29/2019   Influenza Split 07/31/2011   Influenza,inj,Quad PF,6+ Mos  07/20/2013, 07/05/2014, 07/16/2015, 06/24/2016, 07/25/2017, 09/08/2018, 08/17/2020   Influenza-Unspecified 03/31/2010   MMR 03/14/2018   PFIZER(Purple Top)SARS-COV-2 Vaccination 02/03/2020, 03/02/2020, 10/02/2020   Td 07/06/2005   Tdap 07/06/2005, 09/07/2013   Typhoid Inactivated 03/31/2010    Past Medical History:  Diagnosis Date   Allergy    Alopecia    Anxiety and depression 07/31/2011   Chest pain 09/22/10   Sharp pains in left breast & sometimes in the right breast as well. Random in occurence & nonexertional. Also has occasional SOB & DOE when going up stairs & occasionally when working out. Nuclear Stress Treadmill 09/29/10 -    HSV-2 (herpes simplex virus 2) infection    IBS (irritable bowel syndrome)    Interstitial cystitis    Multiple allergies 07/02/2011   Neuromuscular disorder ([place])    past hx dx of Fibromyalgia   Otitis media 03/16/2012   Perimenopausal 07/22/2013   PONV (postoperative nausea and vomiting)    Poor concentration 07/02/2011   Shoulder pain, left 04/11/2012   SOB (shortness of breath) 09/22/10   ECHO 09/29/10 - NL LVF, trivial PE, trivial TR/PR.   UTI (lower urinary tract infection) 08/28/2011    Tobacco History: Social History   Tobacco Use  Smoking Status Never  Smokeless Tobacco Never   Counseling given: Not Answered   Outpatient Medications Prior to Visit  Medication Sig Dispense Refill   albuterol (VENTOLIN HFA) 108 (90 Base) MCG/ACT inhaler TAKE 2 PUFFS BY MOUTH EVERY 6 HOURS AS NEEDED FOR WHEEZE OR SHORTNESS OF BREATH 18  each 1   amphetamine-dextroamphetamine (ADDERALL) 20 MG tablet Take 1 tablet (20 mg total) by mouth 2 (two) times daily. 60 tablet 0   cetirizine (ZYRTEC) 10 MG tablet Take 1 tablet (10 mg total) by mouth 2 (two) times daily as needed for allergies. 60 tablet 2   Cholecalciferol 50 MCG (2000 UT) TABS Take 1 tablet by mouth daily.     estradiol (ESTRACE VAGINAL) 0.1 MG/GM vaginal cream Insert 1 gram vaginally twice weekly.  42.5 g 3   estradiol (ESTRACE) 0.5 MG tablet Take 2 tablets (1 mg total) by mouth daily. 180 tablet 4   famotidine (PEPCID) 40 MG tablet TAKE 1 TABLET BY MOUTH EVERYDAY AT BEDTIME 90 tablet 1   fexofenadine (ALLEGRA) 180 MG tablet Take 180 mg by mouth daily.       finasteride (PROSCAR) 5 MG tablet TAKE 1 TABLET BY MOUTH EVERY DAY 90 tablet 1   flunisolide (NASALIDE) 25 MCG/ACT (0.025%) SOLN PLEASE SPECIFY DIRECTIONS, REFILLS AND QUANTITY 75 mL 11   progesterone (PROMETRIUM) 100 MG capsule Take 1 capsule (100 mg total) by mouth at bedtime. 90 capsule 4   Spacer/Aero-Holding Chambers (AEROCHAMBER MINI CHAMBER) DEVI Use as needed with albuterol HFA 1 each 0   spironolactone (ALDACTONE) 100 MG tablet Take 100 mg by mouth daily.     tretinoin (RETIN-A) 0.05 % cream Apply topically.     UNABLE TO FIND cocovia     valACYclovir (VALTREX) 500 MG tablet Take 1 tablet (500 mg total) by mouth daily. 90 tablet 3   XIIDRA 5 % SOLN      No facility-administered medications prior to visit.     Review of Systems:   Constitutional:   No  weight loss, night sweats,  Fevers, chills, fatigue, or  lassitude.  HEENT:   No headaches,  Difficulty swallowing,  Tooth/dental problems, or  Sore throat,                No sneezing, itching, ear ache,  +nasal congestion, post nasal drip,   CV:  No chest pain,  Orthopnea, PND, swelling in lower extremities, anasarca, dizziness, palpitations, syncope.   GI  No heartburn, indigestion, abdominal pain, nausea, vomiting, diarrhea, change in bowel habits, loss of appetite, bloody stools.   Resp: .  No chest wall deformity  Skin: no rash or lesions.  GU: no dysuria, change in color of urine, no urgency or frequency.  No flank pain, no hematuria   MS:  No joint pain or swelling.  No decreased range of motion.  No back pain.    Physical Exam  BP 110/66 (BP Location: Left Arm, Patient Position: Sitting, Cuff Size: Normal)   Pulse 82   Temp 97.8 F (36.6 C) (Oral)    Ht 5' 6" (1.676 m)   Wt 126 lb 12.8 oz (57.5 kg)   LMP 07/03/2015   SpO2 96%   BMI 20.47 kg/m   GEN: A/Ox3; pleasant , NAD, well nourished    HEENT:  [place]/AT,  EACs-clear, TMs-wnl, NOSE-clear, THROAT-clear, no lesions, no postnasal drip or exudate noted.   NECK:  Supple w/ fair ROM; no JVD; normal carotid impulses w/o bruits; no thyromegaly or nodules palpated; no lymphadenopathy.    RESP  Clear  P & A; w/o, wheezes/ rales/ or rhonchi. no accessory muscle use, no dullness to percussion  CARD:  RRR, no m/r/g, no peripheral edema, pulses intact, no cyanosis or clubbing.  GI:   Soft & nt; nml bowel sounds; no organomegaly or  masses detected.   Musco: Warm bil, no deformities or joint swelling noted.   Neuro: alert, no focal deficits noted.    Skin: Warm, no lesions or rashes        BNP No results found for: BNP  ProBNP No results found for: PROBNP     PFT Results Latest Ref Rng & Units 06/08/2021  FVC-Pre L 4.01  FVC-Predicted Pre % 107  FVC-Post L 3.83  FVC-Predicted Post % 102  Pre FEV1/FVC % % 90  Post FEV1/FCV % % 63  FEV1-Pre L 3.61  FEV1-Predicted Pre % 122  FEV1-Post L 2.43  DLCO uncorrected ml/min/mmHg 25.05  DLCO UNC% % 112  DLCO corrected ml/min/mmHg 25.05  DLCO COR %Predicted % 112  DLVA Predicted % 101  TLC L 6.68  TLC % Predicted % 124  RV % Predicted % 140    No results found for: NITRICOXIDE      Assessment & Plan:   Chronic cough Chronic cough for 1 year -suspect some upper airway irritation/hypersensitivity/possible reactive airways with postnasal drainage trigger. We will increase her antihistamine, add cough control regimen, trial of bronchodilator  Plan  Patient Instructions  Change Zyrtec to Allegra 180m in am  Add Chlorpheniramine 477mAt bedtime As needed  drainage  (over the counter )  Deslym 2 tsp Twice daily  for cough As needed  (over the counter)  Tessalon Three times a day  for cough as needed  Albuterol Inhaler 1-2  puffs every 6hr as needed for wheezing /shortness of breath .  Follow up with Dr. ElLoanne Drillingr Parrett NP in 6 weeks and As needed  Please contact office for sooner follow up if symptoms do not improve or worsen or seek emergency care       GERD (gastroesophageal reflux disease) Continue on Pepcid at bedtime.    I spent 32   minutes dedicated to the care of this patient on the date of this encounter to include pre-visit review of records, face-to-face time with the patient discussing conditions above, post visit ordering of testing, clinical documentation with the electronic health record, making appropriate referrals as documented, and communicating necessary findings to members of the patients care team.   TaRexene EdisonNP 06/08/2021

## 2021-07-14 ENCOUNTER — Ambulatory Visit: Payer: 59 | Admitting: Internal Medicine

## 2021-07-21 ENCOUNTER — Ambulatory Visit
Admission: RE | Admit: 2021-07-21 | Discharge: 2021-07-21 | Disposition: A | Discharge status: Home / Self Care | Payer: 59 | Source: Ambulatory Visit | Attending: Internal Medicine | Admitting: Internal Medicine

## 2021-07-21 ENCOUNTER — Other Ambulatory Visit (HOSPITAL_COMMUNITY)
Admission: RE | Admit: 2021-07-21 | Discharge: 2021-07-21 | Disposition: A | Discharge status: Home / Self Care | Payer: 59 | Source: Ambulatory Visit | Attending: Internal Medicine | Admitting: Internal Medicine

## 2021-07-21 DIAGNOSIS — E042 Nontoxic multinodular goiter: Secondary | ICD-10-CM

## 2021-07-21 DIAGNOSIS — D34 Benign neoplasm of thyroid gland: Secondary | ICD-10-CM | POA: Insufficient documentation

## 2021-07-21 DIAGNOSIS — E041 Nontoxic single thyroid nodule: Secondary | ICD-10-CM | POA: Diagnosis present

## 2021-07-22 ENCOUNTER — Encounter: Payer: Self-pay | Admitting: Adult Health

## 2021-07-22 ENCOUNTER — Other Ambulatory Visit: Payer: Self-pay

## 2021-07-22 ENCOUNTER — Ambulatory Visit (INDEPENDENT_AMBULATORY_CARE_PROVIDER_SITE_OTHER): Payer: 59 | Admitting: Adult Health

## 2021-07-22 VITALS — BP 112/62 | HR 59 | Ht 66.0 in | Wt 126.6 lb

## 2021-07-22 DIAGNOSIS — R053 Chronic cough: Secondary | ICD-10-CM | POA: Diagnosis not present

## 2021-07-22 DIAGNOSIS — Z23 Encounter for immunization: Secondary | ICD-10-CM

## 2021-07-22 LAB — CBC WITH DIFFERENTIAL/PLATELET
Basophils Absolute: 0 10*3/uL (ref 0.0–0.1)
Basophils Relative: 0.6 % (ref 0.0–3.0)
Eosinophils Absolute: 0 10*3/uL (ref 0.0–0.7)
Eosinophils Relative: 0.4 % (ref 0.0–5.0)
HCT: 41.8 % (ref 36.0–46.0)
Hemoglobin: 14 g/dL (ref 12.0–15.0)
Lymphocytes Relative: 26.6 % (ref 12.0–46.0)
Lymphs Abs: 1.5 10*3/uL (ref 0.7–4.0)
MCHC: 33.5 g/dL (ref 30.0–36.0)
MCV: 89.1 fl (ref 78.0–100.0)
Monocytes Absolute: 0.3 10*3/uL (ref 0.1–1.0)
Monocytes Relative: 6 % (ref 3.0–12.0)
Neutro Abs: 3.7 10*3/uL (ref 1.4–7.7)
Neutrophils Relative %: 66.4 % (ref 43.0–77.0)
Platelets: 194 10*3/uL (ref 150.0–400.0)
RBC: 4.69 Mil/uL (ref 3.87–5.11)
RDW: 13.1 % (ref 11.5–15.5)
WBC: 5.6 10*3/uL (ref 4.0–10.5)

## 2021-07-22 LAB — BASIC METABOLIC PANEL
BUN: 24 mg/dL — ABNORMAL HIGH (ref 6–23)
CO2: 28 mEq/L (ref 19–32)
Calcium: 9.8 mg/dL (ref 8.4–10.5)
Chloride: 103 mEq/L (ref 96–112)
Creatinine, Ser: 0.79 mg/dL (ref 0.40–1.20)
GFR: 85.87 mL/min (ref >60.00)
Glucose, Bld: 104 mg/dL — ABNORMAL HIGH (ref 70–99)
Potassium: 4.8 mEq/L (ref 3.5–5.1)
Sodium: 137 mEq/L (ref 135–145)

## 2021-07-22 LAB — BRAIN NATRIURETIC PEPTIDE: Pro B Natriuretic peptide (BNP): 7 pg/mL (ref 0.0–100.0)

## 2021-07-22 MED ORDER — BENZONATATE 200 MG PO CAPS: 200.0000 mg | ORAL_CAPSULE | Freq: Three times a day (TID) | ORAL | 1 refills | Status: DC | PRN

## 2021-07-22 NOTE — Progress Notes (Signed)
_0  ID: [name], female    DOB: Jan 13, 1971, 50 y.o.   MRN: [id number]  Chief Complaint  Patient presents with   Follow-up    Referring provider: Mosie Lukes, MD  HPI: 50 year old female never smoker seen for pulmonary consult May 19, 2021 for chronic cough x1 year Patient is a TV anchor.  TEST/EVENTS :  Chest x-ray January 05, 2021 showed clear lungs.   01/05/21 Pulmonary function testing that showed normal lung function with no airflow obstruction or restriction and a normal diffusing capacity.  FEV1 122%, ratio 90, FVC 107%.,  No significant bronchodilator response, DLCO 112%.  Cough workup :   No known occupational exposures.  Does travel domestically.  Denies basement or hot tub.  Does not smoke.  No birds or chickens.  07/22/2021 Follow up : Cough  Patient returns for 6-week follow-up.  Patient was seen last visit for a follow-up from a chronic cough over the last year.  Pulmonary function testing showed normal lung function .  Chest x-ray was clear. Patient was recommended to change to Allegra daily.  Add in chlor tabs at bedtime.  And use Delsym and Tessalon for cough control.  She says that her cough is much improved, feels her cough is at least 75% better. She is concerned that she continues to have ongoing cough that has not totally resolved.  She does have some intermittent throat clearing and drainage in her throat.  She denies any hemoptysis, chest pain, orthopnea, dyspnea.  She says she is very active.  Does have some cough with exercise. Patient says she has a history of seasonal allergies.  Patient does have a cat in the home. She has seen ENT in the past, laryngoscopy April 09, 2021 showed normal vocal cords and mobility.  Mild edema of the arytenoid mucosa but no mucosal abnormalities noted. She remains on Pepcid 40 mg at bedtime.  Denies any reflux. Is currently being evaluated for thyroid nodules.  Also undergoing treatment for hair loss   No  Known Allergies  Immunization History  Administered Date(s) Administered   Hepatitis A 10/21/1998, 03/07/2010   Hepatitis B 07/22/1998, 09/08/1998, 01/19/1999   Influenza Inj Mdck Quad Pf 07/29/2019   Influenza Split 07/31/2011   Influenza,inj,Quad PF,6+ Mos 07/20/2013, 07/05/2014, 07/16/2015, 06/24/2016, 07/25/2017, 09/08/2018, 08/17/2020, 07/22/2021   Influenza-Unspecified 03/31/2010   MMR 03/14/2018   PFIZER(Purple Top)SARS-COV-2 Vaccination 02/03/2020, 03/02/2020, 10/02/2020   Td 07/06/2005   Tdap 07/06/2005, 09/07/2013   Typhoid Inactivated 03/31/2010    Past Medical History:  Diagnosis Date   Allergy    Alopecia    Anxiety and depression 07/31/2011   Chest pain 09/22/10   Sharp pains in left breast & sometimes in the right breast as well. Random in occurence & nonexertional. Also has occasional SOB & DOE when going up stairs & occasionally when working out. Nuclear Stress Treadmill 09/29/10 -    HSV-2 (herpes simplex virus 2) infection    IBS (irritable bowel syndrome)    Interstitial cystitis    Multiple allergies 07/02/2011   Neuromuscular disorder ([place])    past hx dx of Fibromyalgia   Otitis media 03/16/2012   Perimenopausal 07/22/2013   PONV (postoperative nausea and vomiting)    Poor concentration 07/02/2011   Shoulder pain, left 04/11/2012   SOB (shortness of breath) 09/22/10   ECHO 09/29/10 - NL LVF, trivial PE, trivial TR/PR.   UTI (lower urinary tract infection) 08/28/2011    Tobacco History: Social History   Tobacco Use  Smoking Status Never  Smokeless Tobacco Never   Counseling given: Not Answered   Outpatient Medications Prior to Visit  Medication Sig Dispense Refill   albuterol (VENTOLIN HFA) 108 (90 Base) MCG/ACT inhaler TAKE 2 PUFFS BY MOUTH EVERY 6 HOURS AS NEEDED FOR WHEEZE OR SHORTNESS OF BREATH 18 each 1   albuterol (VENTOLIN HFA) 108 (90 Base) MCG/ACT inhaler Inhale 1-2 puffs into the lungs every 6 (six) hours as needed for wheezing or  shortness of breath. 8 g 1   amphetamine-dextroamphetamine (ADDERALL) 20 MG tablet Take 1 tablet (20 mg total) by mouth 2 (two) times daily. 60 tablet 0   cetirizine (ZYRTEC) 10 MG tablet Take 1 tablet (10 mg total) by mouth 2 (two) times daily as needed for allergies. 60 tablet 2   Cholecalciferol 50 MCG (2000 UT) TABS Take 1 tablet by mouth daily.     estradiol (ESTRACE VAGINAL) 0.1 MG/GM vaginal cream Insert 1 gram vaginally twice weekly. 42.5 g 3   estradiol (ESTRACE) 0.5 MG tablet Take 2 tablets (1 mg total) by mouth daily. 180 tablet 4   famotidine (PEPCID) 40 MG tablet TAKE 1 TABLET BY MOUTH EVERYDAY AT BEDTIME 90 tablet 1   fexofenadine (ALLEGRA ALLERGY) 180 MG tablet Take 1 tablet (180 mg total) by mouth daily. 30 tablet 5   fexofenadine (ALLEGRA) 180 MG tablet Take 180 mg by mouth daily.       finasteride (PROSCAR) 5 MG tablet TAKE 1 TABLET BY MOUTH EVERY DAY 90 tablet 1   flunisolide (NASALIDE) 25 MCG/ACT (0.025%) SOLN PLEASE SPECIFY DIRECTIONS, REFILLS AND QUANTITY 75 mL 11   progesterone (PROMETRIUM) 100 MG capsule Take 1 capsule (100 mg total) by mouth at bedtime. 90 capsule 4   Spacer/Aero-Holding Chambers (AEROCHAMBER MINI CHAMBER) DEVI Use as needed with albuterol HFA 1 each 0   spironolactone (ALDACTONE) 100 MG tablet Take 100 mg by mouth daily.     tretinoin (RETIN-A) 0.05 % cream Apply topically.     UNABLE TO FIND cocovia     valACYclovir (VALTREX) 500 MG tablet Take 1 tablet (500 mg total) by mouth daily. 90 tablet 3   XIIDRA 5 % SOLN      benzonatate (TESSALON) 200 MG capsule Take 1 capsule (200 mg total) by mouth 3 (three) times daily as needed for cough. 30 capsule 1   No facility-administered medications prior to visit.     Review of Systems:   Constitutional:   No  weight loss, night sweats,  Fevers, chills, fatigue, or  lassitude.  HEENT:   No headaches,  Difficulty swallowing,  Tooth/dental problems, or  Sore throat,                No sneezing, itching, ear  ache, + nasal congestion, post nasal drip,   CV:  No chest pain,  Orthopnea, PND, swelling in lower extremities, anasarca, dizziness, palpitations, syncope.   GI  No heartburn, indigestion, abdominal pain, nausea, vomiting, diarrhea, change in bowel habits, loss of appetite, bloody stools.   Resp: No shortness of breath with exertion or at rest.  No excess mucus, no productive cough,    No coughing up of blood.  No change in color of mucus.  No wheezing.  No chest wall deformity  Skin: no rash or lesions.  GU: no dysuria, change in color of urine, no urgency or frequency.  No flank pain, no hematuria   MS:  No joint pain or swelling.  No decreased range of motion.  No  back pain.    Physical Exam  BP 112/62 (BP Location: Left Arm, Patient Position: Sitting, Cuff Size: Normal)   Pulse (!) 59   Ht 5' 6" (1.676 m)   Wt 126 lb 9.6 oz (57.4 kg)   LMP 07/03/2015   SpO2 98%   BMI 20.43 kg/m   GEN: A/Ox3; pleasant , NAD, well nourished    HEENT:  [place]/AT,  NOSE-clear, THROAT-clear, no lesions, no postnasal drip or exudate noted.   NECK:  Supple w/ fair ROM; no JVD; normal carotid impulses w/o bruits; no thyromegaly or nodules palpated; no lymphadenopathy.    RESP  Clear  P & A; w/o, wheezes/ rales/ or rhonchi. no accessory muscle use, no dullness to percussion  CARD:  RRR, no m/r/g, no peripheral edema, pulses intact, no cyanosis or clubbing.  GI:   Soft & nt; nml bowel sounds; no organomegaly or masses detected.   Musco: Warm bil, no deformities or joint swelling noted.   Neuro: alert, no focal deficits noted.    Skin: Warm, no lesions or rashes    Lab Results:  CBC    Component Value Date/Time   WBC 5.5 09/05/2020 1012   RBC 4.70 09/05/2020 1012   HGB 13.9 09/05/2020 1012   HCT 42.3 09/05/2020 1012   PLT 238.0 09/05/2020 1012   MCV 90.1 09/05/2020 1012   MCH 30.4 12/25/2018 1441   MCHC 32.9 09/05/2020 1012   RDW 13.4 09/05/2020 1012   LYMPHSABS 1,767 12/25/2018  1441   MONOABS 246 12/21/2016 1111   EOSABS 31 12/25/2018 1441   BASOSABS 37 12/25/2018 1441    BMET    Component Value Date/Time   NA 140 09/05/2020 1012   K 4.2 09/05/2020 1012   CL 103 09/05/2020 1012   CO2 31 09/05/2020 1012   GLUCOSE 89 09/05/2020 1012   BUN 22 09/05/2020 1012   CREATININE 0.85 09/05/2020 1012   CREATININE 0.79 12/23/2017 0932   CALCIUM 9.5 09/05/2020 1012    BNP No results found for: BNP  ProBNP No results found for: PROBNP  Imaging:    PFT Results Latest Ref Rng & Units 06/08/2021  FVC-Pre L 4.01  FVC-Predicted Pre % 107  FVC-Post L 3.83  FVC-Predicted Post % 102  Pre FEV1/FVC % % 90  Post FEV1/FCV % % 63  FEV1-Pre L 3.61  FEV1-Predicted Pre % 122  FEV1-Post L 2.43  DLCO uncorrected ml/min/mmHg 25.05  DLCO UNC% % 112  DLCO corrected ml/min/mmHg 25.05  DLCO COR %Predicted % 112  DLVA Predicted % 101  TLC L 6.68  TLC % Predicted % 124  RV % Predicted % 140    No results found for: NITRICOXIDE      Assessment & Plan:   Chronic cough Upper airway cough syndrome x1 year.  Patient has had improvement with treatment aimed at chronic rhinitis, GERD, cough suppression regimen Work-up has been unrevealing with normal pulmonary function testing.  Chest x-ray was clear 12/2020.  Will check labs today with a CBC with differential, IgE and allergy panel. If cough does not resolve we will consider a CT chest high-res  Plan  Patient Instructions  Flu shot today  Labs today .  Continue Allegra 114m in am  Continue on Chlorpheniramine 447mAt bedtime As needed  drainage  (over the counter )  Deslym 2 tsp Twice daily  for cough As needed  (over the counter)  Tessalon Three times a day  for cough as needed  Albuterol Inhaler 1-2  puffs every 6hr as needed for wheezing /shortness of breath .  Follow up with Dr. Loanne Drilling or Shaughn Thomley NP in 8  weeks and As needed  Please contact office for sooner follow up if symptoms do not improve or worsen or  seek emergency care       I spent   30 minutes dedicated to the care of this patient on the date of this encounter to include pre-visit review of records, face-to-face time with the patient discussing conditions above, post visit ordering of testing, clinical documentation with the electronic health record, making appropriate referrals as documented, and communicating necessary findings to members of the patients care team.    Rexene Edison, NP 07/22/2021

## 2021-07-22 NOTE — Assessment & Plan Note (Signed)
Upper airway cough syndrome x1 year.  Patient has had improvement with treatment aimed at chronic rhinitis, GERD, cough suppression regimen Work-up has been unrevealing with normal pulmonary function testing.  Chest x-ray was clear 12/2020.  Will check labs today with a CBC with differential, IgE and allergy panel. If cough does not resolve we will consider a CT chest high-res  Plan  Patient Instructions  Flu shot today  Labs today .  Continue Allegra 180mg  in am  Continue on Chlorpheniramine 4mg  At bedtime As needed  drainage  (over the counter )  Deslym 2 tsp Twice daily  for cough As needed  (over the counter)  Tessalon Three times a day  for cough as needed  Albuterol Inhaler 1-2 puffs every 6hr as needed for wheezing /shortness of breath .  Follow up with Dr. Loanne Drilling or Averill Winters NP in 8  weeks and As needed  Please contact office for sooner follow up if symptoms do not improve or worsen or seek emergency care

## 2021-07-22 NOTE — Patient Instructions (Signed)
Flu shot today  Labs today .  Continue Allegra 180mg  in am  Continue on Chlorpheniramine 4mg  At bedtime As needed  drainage  (over the counter )  Deslym 2 tsp Twice daily  for cough As needed  (over the counter)  Tessalon Three times a day  for cough as needed  Albuterol Inhaler 1-2 puffs every 6hr as needed for wheezing /shortness of breath .  Follow up with Dr. Loanne Drilling or Luken Shadowens NP in 8  weeks and As needed  Please contact office for sooner follow up if symptoms do not improve or worsen or seek emergency care

## 2021-07-23 LAB — RESPIRATORY ALLERGY PROFILE REGION II ~~LOC~~

## 2021-07-23 LAB — INTERPRETATION:

## 2021-07-24 LAB — CYTOLOGY - NON PAP

## 2021-07-31 ENCOUNTER — Other Ambulatory Visit: Payer: Self-pay

## 2021-07-31 ENCOUNTER — Encounter: Payer: Self-pay | Admitting: Internal Medicine

## 2021-07-31 ENCOUNTER — Ambulatory Visit (INDEPENDENT_AMBULATORY_CARE_PROVIDER_SITE_OTHER): Payer: 59 | Admitting: Internal Medicine

## 2021-07-31 VITALS — BP 116/62 | HR 62 | Temp 97.9°F | Resp 16 | Ht 66.0 in | Wt 128.1 lb

## 2021-07-31 DIAGNOSIS — R399 Unspecified symptoms and signs involving the genitourinary system: Secondary | ICD-10-CM | POA: Diagnosis not present

## 2021-07-31 DIAGNOSIS — N3010 Interstitial cystitis (chronic) without hematuria: Secondary | ICD-10-CM

## 2021-07-31 LAB — POC URINALSYSI DIPSTICK (AUTOMATED)
Blood, UA: NEGATIVE
Glucose, UA: NEGATIVE
Ketones, UA: NEGATIVE
Leukocytes, UA: NEGATIVE
Nitrite, UA: NEGATIVE
Protein, UA: NEGATIVE
Spec Grav, UA: 1.02 (ref 1.010–1.025)
Urobilinogen, UA: 0.2 E.U./dL
pH, UA: 6 (ref 5.0–8.0)

## 2021-07-31 NOTE — Patient Instructions (Signed)
Drink plenty of fluids   Will refer you to urology

## 2021-07-31 NOTE — Progress Notes (Signed)
Subjective:    Patient ID: [name], female    DOB: Mar 13, 1971, 50 y.o.   MRN: [id number]  DOS:  07/31/2021 Type of visit - description: Acute Has developed some suprapubic discomfort after voiding, likes to be sure she does not have a UTI. Has a h/o  interstitial cystitis.  Denies fever chills No nausea or vomiting No flank pain No gross hematuria No vaginal discharge or bleeding.  No genital rash.   Review of Systems See above   Past Medical History:  Diagnosis Date   Allergy    Alopecia    Anxiety and depression 07/31/2011   Chest pain 09/22/10   Sharp pains in left breast & sometimes in the right breast as well. Random in occurence & nonexertional. Also has occasional SOB & DOE when going up stairs & occasionally when working out. Nuclear Stress Treadmill 09/29/10 -    HSV-2 (herpes simplex virus 2) infection    IBS (irritable bowel syndrome)    Interstitial cystitis    Multiple allergies 07/02/2011   Neuromuscular disorder ([place])    past hx dx of Fibromyalgia   Otitis media 03/16/2012   Perimenopausal 07/22/2013   PONV (postoperative nausea and vomiting)    Poor concentration 07/02/2011   Shoulder pain, left 04/11/2012   SOB (shortness of breath) 09/22/10   ECHO 09/29/10 - NL LVF, trivial PE, trivial TR/PR.   UTI (lower urinary tract infection) 08/28/2011    Past Surgical History:  Procedure Laterality Date   BREAST BIOPSY Right 03/12/2016   Procedure: breast exploration;  Surgeon: Jackolyn Confer, MD;  Location: [facility];  Service: General;  Laterality: Right;   BREAST ENHANCEMENT SURGERY  2007   CESAREAN SECTION  2001 and 2004   x2   COLONOSCOPY  2007   LAPAROSCOPY     twice   RHINOPLASTY  2003   [place]   TUBAL LIGATION     UPPER GASTROINTESTINAL ENDOSCOPY  2011    Allergies as of 07/31/2021   No Known Allergies      Medication List        Accurate as of July 31, 2021  2:36 PM. If you have any  questions, ask your nurse or doctor.          AeroChamber [employer]a Use as needed with albuterol HFA   albuterol 108 (90 Base) MCG/ACT inhaler Commonly known as: VENTOLIN HFA Inhale 1-2 puffs into the lungs every 6 (six) hours as needed for wheezing or shortness of breath. What changed: Another medication with the same name was removed. Continue taking this medication, and follow the directions you see here. Changed by: Kathlene November, MD   amphetamine-dextroamphetamine 20 MG tablet Commonly known as: ADDERALL Take 1 tablet (20 mg total) by mouth 2 (two) times daily.   benzonatate 200 MG capsule Commonly known as: TESSALON Take 1 capsule (200 mg total) by mouth 3 (three) times daily as needed for cough.   cetirizine 10 MG tablet Commonly known as: ZYRTEC Take 1 tablet (10 mg total) by mouth 2 (two) times daily as needed for allergies.   Cholecalciferol 50 MCG (2000 UT) Tabs Take 1 tablet by mouth daily.   estradiol 0.1 MG/GM vaginal cream Commonly known as: ESTRACE VAGINAL Insert 1 gram vaginally twice weekly.   estradiol 0.5 MG tablet Commonly known as: ESTRACE Take 2 tablets (1 mg total) by mouth daily.   famotidine 40 MG tablet Commonly known as: PEPCID TAKE 1 TABLET BY MOUTH  EVERYDAY AT BEDTIME   fexofenadine 180 MG tablet Commonly known as: ALLEGRA Take 180 mg by mouth daily. What changed: Another medication with the same name was removed. Continue taking this medication, and follow the directions you see here. Changed by: Kathlene November, MD   finasteride 5 MG tablet Commonly known as: PROSCAR TAKE 1 TABLET BY MOUTH EVERY DAY   flunisolide 25 MCG/ACT (0.025%) Soln Commonly known as: NASALIDE PLEASE SPECIFY DIRECTIONS, REFILLS AND QUANTITY   progesterone 100 MG capsule Commonly known as: PROMETRIUM Take 1 capsule (100 mg total) by mouth at bedtime.   spironolactone 100 MG tablet Commonly known as: ALDACTONE Take 100 mg by mouth daily.   tretinoin 0.05 %  cream Commonly known as: RETIN-A Apply topically.   UNABLE TO FIND cocovia   valACYclovir 500 MG tablet Commonly known as: VALTREX Take 1 tablet (500 mg total) by mouth daily.   Xiidra 5 % Soln Generic drug: Lifitegrast           Objective:   Physical Exam BP 116/62 (BP Location: Left Arm, Patient Position: Sitting, Cuff Size: Small)   Pulse 62   Temp 97.9 F (36.6 C) (Oral)   Resp 16   Ht 5\' 6"  (1.676 m)   Wt 128 lb 2 oz (58.1 kg)   LMP 07/03/2015   SpO2 97%   BMI 20.68 kg/m  General:   Well developed, NAD, BMI noted.  HEENT:  Normocephalic . Face symmetric, atraumatic Abdomen:  Not distended, soft, non-tender. No rebound or rigidity.  No CVA tenderness Skin: Not pale. Not jaundice Lower extremities: no pretibial edema bilaterally  Neurologic:  alert & oriented X3.  Speech normal, gait appropriate for age and unassisted Psych--  Cognition and judgment appear intact.  Cooperative with normal attention span and concentration.  Behavior appropriate. No anxious or depressed appearing.     Assessment      50 year old female, PMH includes hyperlipidemia, anxiety, alopecia, depression, GERD, interstitial cystitis, HSV, chronic cough, IBS, on HRT, ADD, presents with:  Interstitial cystitis versus UTI: Symptoms as described above,  urine dip is negative.  We will send a UA and urine culture. Overall, suspect this is a exacerbation of IC.  Recommend to drink plenty of fluids and call if symptoms get worse. She would like some more specific treatment, has not seen urology in a while, will refer her, has seen Dr. Amalia Hailey before.     This visit occurred during the SARS-CoV-2 public health emergency.  Safety protocols were in place, including screening questions prior to the visit, additional usage of staff PPE, and extensive cleaning of exam room while observing appropriate contact time as indicated for disinfecting solutions.

## 2021-08-01 LAB — URINALYSIS, ROUTINE W REFLEX MICROSCOPIC
Bacteria, UA: NONE SEEN /HPF
Bilirubin Urine: NEGATIVE
Glucose, UA: NEGATIVE
Hgb urine dipstick: NEGATIVE
Hyaline Cast: NONE SEEN /LPF
Ketones, ur: NEGATIVE
Leukocytes,Ua: NEGATIVE
Nitrite: NEGATIVE
Protein, ur: NEGATIVE
RBC / HPF: NONE SEEN /HPF (ref 0–2)
Specific Gravity, Urine: 1.027 (ref 1.001–1.035)
Squamous Epithelial / HPF: NONE SEEN /HPF (ref <=5)
WBC, UA: NONE SEEN /HPF (ref 0–5)
pH: 5.5 (ref 5.0–8.0)

## 2021-08-01 LAB — URINE CULTURE
MICRO NUMBER:: 12445747
Result:: NO GROWTH
SPECIMEN QUALITY:: ADEQUATE

## 2021-08-11 ENCOUNTER — Telehealth: Payer: Self-pay | Admitting: Family Medicine

## 2021-08-11 NOTE — Telephone Encounter (Signed)
Patient is currently out of town and would like a call back regarding prescribing meds. Please advise.

## 2021-08-12 NOTE — Telephone Encounter (Signed)
Can I put her in 10/24 at 10:20am?

## 2021-08-12 NOTE — Telephone Encounter (Signed)
Can we do next Wednesday? Because she won't be back until sometime this weekend

## 2021-08-12 NOTE — Telephone Encounter (Signed)
Pt is being scheduled

## 2021-08-19 ENCOUNTER — Encounter: Payer: Self-pay | Admitting: Family Medicine

## 2021-08-19 ENCOUNTER — Other Ambulatory Visit: Payer: Self-pay | Admitting: Family Medicine

## 2021-08-19 ENCOUNTER — Other Ambulatory Visit: Payer: Self-pay

## 2021-08-19 ENCOUNTER — Telehealth (INDEPENDENT_AMBULATORY_CARE_PROVIDER_SITE_OTHER): Payer: 59 | Admitting: Family Medicine

## 2021-08-19 DIAGNOSIS — M6289 Other specified disorders of muscle: Secondary | ICD-10-CM

## 2021-08-19 DIAGNOSIS — N3010 Interstitial cystitis (chronic) without hematuria: Secondary | ICD-10-CM | POA: Diagnosis not present

## 2021-08-19 MED ORDER — HYDROXYZINE HCL 10 MG PO TABS: 10.0000 mg | ORAL_TABLET | Freq: Three times a day (TID) | ORAL | 1 refills | Status: AC | PRN

## 2021-08-19 MED ORDER — PHENAZOPYRIDINE HCL 200 MG PO TABS: 200.0000 mg | ORAL_TABLET | Freq: Three times a day (TID) | ORAL | 1 refills | Status: DC | PRN

## 2021-08-19 MED ORDER — TRAMADOL HCL 50 MG PO TABS: 50.0000 mg | ORAL_TABLET | Freq: Three times a day (TID) | ORAL | 0 refills | Status: DC | PRN

## 2021-08-19 MED ORDER — ME/NAPHOS/MB/HYO1 81.6 MG PO TABS: 1.0000 | ORAL_TABLET | Freq: Four times a day (QID) | ORAL | 2 refills | Status: AC | PRN

## 2021-08-19 NOTE — Assessment & Plan Note (Addendum)
She is currently having a flare while on vacation. She has had her urine checked twice and it has been negative for infection. Still having burning and pressure which she is managing with old meds from her past flare years ago, her urologist has left and she has an appt with a new urologist on November 15 but needs refills on meds that worked int he past. She is given an rx for Tramadol for 50 mg tid prn, Hydroxyzine 10-20 mg tid prn, Me-Naphos-mb-hyo1 qid prn pain. She is also referred for another course of pelvic floor therapy which has been helpful in the past. She is having urinary hesitancy and difficulty emptying her bladder. Spent 20 minutes reviewing history, current state and plan of care.  D Mannose, flaxseed oil caps and Multivitamin with minerals for healing are recommended. Avoid caffeine/coffee and alcohol

## 2021-08-19 NOTE — Progress Notes (Signed)
MyChart Video Visit    Virtual Visit via Video Note   This visit type was conducted due to national recommendations for restrictions regarding the COVID-19 Pandemic (e.g. social distancing) in an effort to limit this patient's exposure and mitigate transmission in our community. This patient is at least at moderate risk for complications without adequate follow up. This format is felt to be most appropriate for this patient at this time. Physical exam was limited by quality of the video and audio technology used for the visit. S Chism, CMA was able to get the patient set up on a video visit.  Patient location: Home Patient and provider in visit Provider location: Office  I discussed the limitations of evaluation and management by telemedicine and the availability of in person appointments. The patient expressed understanding and agreed to proceed.  Visit Date: 08/19/2021  Today's healthcare provider: Penni Homans, MD     Subjective:    Patient ID: [name], female    DOB: 24-May-1971, 50 y.o.   MRN: [id number]  Chief Complaint  Patient presents with   Medication Consultation    HPI Patient is in today for evaluation of urinary symptoms.  She has a history of interstitial cystitis previously treated by Dr. Amalia Hailey.  He has left and she is now awaiting a new patient appointment with a new urologist but that is not until November 15.  Over the last month she had a flare for the first time in several years.  Previously Dr. Amalia Hailey helped her manage her flares with tramadol, hydroxyzine, a bladder pain reliever, with adequate results but she is in need of refills.  She has had no fevers or chills.  She has had 2 urinalysis and cultures run and they have been negative.  Past Medical History:  Diagnosis Date   Allergy    Alopecia    Anxiety and depression 07/31/2011   Chest pain 09/22/10   Sharp pains in left breast & sometimes in the right breast as well. Random in  occurence & nonexertional. Also has occasional SOB & DOE when going up stairs & occasionally when working out. Nuclear Stress Treadmill 09/29/10 -    HSV-2 (herpes simplex virus 2) infection    IBS (irritable bowel syndrome)    Interstitial cystitis    Multiple allergies 07/02/2011   Neuromuscular disorder ([place])    past hx dx of Fibromyalgia   Otitis media 03/16/2012   Perimenopausal 07/22/2013   PONV (postoperative nausea and vomiting)    Poor concentration 07/02/2011   Shoulder pain, left 04/11/2012   SOB (shortness of breath) 09/22/10   ECHO 09/29/10 - NL LVF, trivial PE, trivial TR/PR.   UTI (lower urinary tract infection) 08/28/2011    Past Surgical History:  Procedure Laterality Date   BREAST BIOPSY Right 03/12/2016   Procedure: breast exploration;  Surgeon: Jackolyn Confer, MD;  Location: [facility];  Service: General;  Laterality: Right;   BREAST ENHANCEMENT SURGERY  2007   CESAREAN SECTION  2001 and 2004   x2   COLONOSCOPY  2007   LAPAROSCOPY     twice   RHINOPLASTY  2003   TONSILLECTOMY  1982   TUBAL LIGATION     UPPER GASTROINTESTINAL ENDOSCOPY  2011    Family History  Problem Relation Age of Onset   Heart disease Mother        MVP   Anxiety disorder Mother    Mitral valve prolapse Mother    Alzheimer's disease Mother  Colon polyps Mother    Hypertension Brother    Obesity Brother    Diabetes Brother        DM, obese   Allergies Daughter    Allergies Son    Kidney disease Maternal Grandmother        uremia   Breast cancer Neg Hx    Colon cancer Neg Hx    Esophageal cancer Neg Hx    Rectal cancer Neg Hx    Stomach cancer Neg Hx     Social History   Socioeconomic History   Marital status: Divorced    Spouse name: Not on file   Number of children: Not on file   Years of education: Not on file   Highest education level: Not on file  Occupational History   Not on file  Tobacco Use   Smoking status: Never   Smokeless tobacco: Never   Vaping Use   Vaping Use: Never used  Substance and Sexual Activity   Alcohol use: Not Currently    Alcohol/week: 0.0 standard drinks   Drug use: Never   Sexual activity: Yes    Partners: Male    Birth control/protection: Surgical    Comment: Tubal lig-1st intercourse 50 yo-More than 5 partners  Other Topics Concern   Not on file  Social History Narrative   Not on file   Social Determinants of Health   Financial Resource Strain: Not on file  Food Insecurity: Not on file  Transportation Needs: Not on file  Physical Activity: Not on file  Stress: Not on file  Social Connections: Not on file  Intimate Partner Violence: Not on file    Outpatient Medications Prior to Visit  Medication Sig Dispense Refill   albuterol (VENTOLIN HFA) 108 (90 Base) MCG/ACT inhaler Inhale 1-2 puffs into the lungs every 6 (six) hours as needed for wheezing or shortness of breath. 8 g 1   amphetamine-dextroamphetamine (ADDERALL) 20 MG tablet Take 1 tablet (20 mg total) by mouth 2 (two) times daily. 60 tablet 0   benzonatate (TESSALON) 200 MG capsule Take 1 capsule (200 mg total) by mouth 3 (three) times daily as needed for cough. 30 capsule 1   cetirizine (ZYRTEC) 10 MG tablet Take 1 tablet (10 mg total) by mouth 2 (two) times daily as needed for allergies. 60 tablet 2   Cholecalciferol 50 MCG (2000 UT) TABS Take 1 tablet by mouth daily.     estradiol (ESTRACE VAGINAL) 0.1 MG/GM vaginal cream Insert 1 gram vaginally twice weekly. 42.5 g 3   estradiol (ESTRACE) 0.5 MG tablet Take 2 tablets (1 mg total) by mouth daily. 180 tablet 4   famotidine (PEPCID) 40 MG tablet TAKE 1 TABLET BY MOUTH EVERYDAY AT BEDTIME 90 tablet 1   fexofenadine (ALLEGRA) 180 MG tablet Take 180 mg by mouth daily.       finasteride (PROSCAR) 5 MG tablet TAKE 1 TABLET BY MOUTH EVERY DAY 90 tablet 1   flunisolide (NASALIDE) 25 MCG/ACT (0.025%) SOLN PLEASE SPECIFY DIRECTIONS, REFILLS AND QUANTITY 75 mL 11   progesterone (PROMETRIUM) 100  MG capsule Take 1 capsule (100 mg total) by mouth at bedtime. 90 capsule 4   Spacer/Aero-Holding Chambers (AEROCHAMBER MINI CHAMBER) DEVI Use as needed with albuterol HFA 1 each 0   spironolactone (ALDACTONE) 100 MG tablet Take 100 mg by mouth daily.     tretinoin (RETIN-A) 0.05 % cream Apply topically.     UNABLE TO FIND cocovia     valACYclovir (VALTREX) 500 MG  tablet Take 1 tablet (500 mg total) by mouth daily. 90 tablet 3   XIIDRA 5 % SOLN      No facility-administered medications prior to visit.    No Known Allergies  Review of Systems  Constitutional:  Negative for fever and malaise/fatigue.  HENT:  Negative for congestion.   Eyes:  Negative for blurred vision.  Respiratory:  Negative for shortness of breath.   Cardiovascular:  Negative for chest pain, palpitations and leg swelling.  Gastrointestinal:  Negative for abdominal pain, blood in stool and nausea.  Genitourinary:  Positive for dysuria, frequency and urgency. Negative for flank pain and hematuria.  Musculoskeletal:  Negative for falls.  Skin:  Negative for rash.  Neurological:  Negative for dizziness, loss of consciousness and headaches.  Endo/Heme/Allergies:  Negative for environmental allergies.  Psychiatric/Behavioral:  Negative for depression. The patient is not nervous/anxious.       Objective:    Physical Exam Constitutional:      General: She is not in acute distress.    Appearance: Normal appearance. She is not ill-appearing or toxic-appearing.  HENT:     Head: Normocephalic and atraumatic.     Right Ear: External ear normal.     Left Ear: External ear normal.     Nose: Nose normal.  Eyes:     General:        Right eye: No discharge.        Left eye: No discharge.  Pulmonary:     Effort: Pulmonary effort is normal.  Skin:    Findings: No rash.  Neurological:     Mental Status: She is alert and oriented to person, place, and time.  Psychiatric:        Behavior: Behavior normal.    LMP  07/03/2015  Wt Readings from Last 3 Encounters:  07/31/21 128 lb 2 oz (58.1 kg)  07/22/21 126 lb 9.6 oz (57.4 kg)  06/08/21 126 lb 12.8 oz (57.5 kg)    Diabetic Foot Exam - Simple   No data filed    Lab Results  Component Value Date   WBC 5.6 07/22/2021   HGB 14.0 07/22/2021   HCT 41.8 07/22/2021   PLT 194.0 07/22/2021   GLUCOSE 104 (H) 07/22/2021   CHOL 201 (H) 09/05/2020   TRIG 66.0 09/05/2020   HDL 61.10 09/05/2020   LDLCALC 127 (H) 09/05/2020   ALT 16 09/05/2020   AST 17 09/05/2020   NA 137 07/22/2021   K 4.8 07/22/2021   CL 103 07/22/2021   CREATININE 0.79 07/22/2021   BUN 24 (H) 07/22/2021   CO2 28 07/22/2021   TSH 1.33 04/10/2021    Lab Results  Component Value Date   TSH 1.33 04/10/2021   Lab Results  Component Value Date   WBC 5.6 07/22/2021   HGB 14.0 07/22/2021   HCT 41.8 07/22/2021   MCV 89.1 07/22/2021   PLT 194.0 07/22/2021   Lab Results  Component Value Date   NA 137 07/22/2021   K 4.8 07/22/2021   CO2 28 07/22/2021   GLUCOSE 104 (H) 07/22/2021   BUN 24 (H) 07/22/2021   CREATININE 0.79 07/22/2021   BILITOT 0.7 09/05/2020   ALKPHOS 38 (L) 09/05/2020   AST 17 09/05/2020   ALT 16 09/05/2020   PROT 6.9 09/05/2020   ALBUMIN 4.7 09/05/2020   CALCIUM 9.8 07/22/2021   GFR 85.87 07/22/2021   Lab Results  Component Value Date   CHOL 201 (H) 09/05/2020   Lab Results  Component Value Date   HDL 61.10 09/05/2020   Lab Results  Component Value Date   LDLCALC 127 (H) 09/05/2020   Lab Results  Component Value Date   TRIG 66.0 09/05/2020   Lab Results  Component Value Date   CHOLHDL 3 09/05/2020   No results found for: HGBA1C     Assessment & Plan:   Problem List Items Addressed This Visit     Interstitial cystitis    She is currently having a flare while on vacation. She has had her urine checked twice and it has been negative for infection. Still having burning and pressure which she is managing with old meds from her past  flare years ago, her urologist has left and she has an appt with a new urologist on November 15 but needs refills on meds that worked int he past. She is given an rx for Tramadol for 50 mg tid prn, Hydroxyzine 10-20 mg tid prn, Me-Naphos-mb-hyo1 qid prn pain. She is also referred for another course of pelvic floor therapy which has been helpful in the past. She is having urinary hesitancy and difficulty emptying her bladder. Spent 20 minutes reviewing history, current state and plan of care.  D Mannose, flaxseed oil caps and Multivitamin with minerals for healing are recommended. Avoid caffeine/coffee and alcohol      Other Visit Diagnoses     Pelvic floor dysfunction in female    -  Primary   Relevant Orders   Ambulatory referral to Physical Therapy       I am having Katherine J. Abadi "[place]" start on ME/NaPhos/MB/Hyo1, traMADol, and hydrOXYzine. I am also having her maintain her fexofenadine, Cholecalciferol, spironolactone, flunisolide, valACYclovir, cetirizine, tretinoin, Xiidra, UNABLE TO FIND, AeroChamber Mini Chamber, amphetamine-dextroamphetamine, finasteride, estradiol, progesterone, famotidine, estradiol, albuterol, and benzonatate.  Meds ordered this encounter  Medications   Methen-Hyosc-Meth Blue-Na Phos (ME/NAPHOS/MB/HYO1) 81.6 MG TABS    Sig: Take 1 tablet by mouth every 6 (six) hours as needed (pain/buring).    Dispense:  60 tablet    Refill:  2   traMADol (ULTRAM) 50 MG tablet    Sig: Take 1 tablet (50 mg total) by mouth every 8 (eight) hours as needed for up to 5 days.    Dispense:  15 tablet    Refill:  0   hydrOXYzine (ATARAX/VISTARIL) 10 MG tablet    Sig: Take 1-2 tablets (10-20 mg total) by mouth every 8 (eight) hours as needed (pain, insomnia).    Dispense:  90 tablet    Refill:  1    I discussed the assessment and treatment plan with the patient. The patient was provided an opportunity to ask questions and all were answered. The patient agreed with the plan and  demonstrated an understanding of the instructions.   The patient was advised to call back or seek an in-person evaluation if the symptoms worsen or if the condition fails to improve as anticipated.  I provided 23 minutes of face-to-face time during this encounter.   Penni Homans, MD [facility] at [facility] [phone] (phone) [phone] (fax)  [place]

## 2021-08-27 ENCOUNTER — Other Ambulatory Visit: Payer: Self-pay | Admitting: Family Medicine

## 2021-08-27 NOTE — Telephone Encounter (Signed)
Pt would like medication to be refilled and if possible enough to last until her Urology appointment on November 15th, 2022.  Medication: traMADol (ULTRAM) 50 MG tablet     Has the patient contacted their pharmacy? No. (If no, request that the patient contact the pharmacy for the refill.) (If yes, when and what did the pharmacy advise?)     Preferred Pharmacy (with phone number or street name):  [id number] - [place], [place] - [place]  [address], [place] [address]  Phone:  [phone]  Fax:  [phone]  Agent: Please be advised that RX refills may take up to 3 business days. We ask that you follow-up with your pharmacy.

## 2021-08-28 ENCOUNTER — Other Ambulatory Visit: Payer: Self-pay | Admitting: Family Medicine

## 2021-08-28 MED ORDER — TRAMADOL HCL 50 MG PO TABS: 50.0000 mg | ORAL_TABLET | Freq: Three times a day (TID) | ORAL | 0 refills | Status: AC | PRN

## 2021-08-28 NOTE — Telephone Encounter (Signed)
Requesting: tramadol Contract: none UDS: none Last Visit: 08/19/21 Next Visit: 09/22/21 Last Refill: 08/24/21  Please Advise

## 2021-08-31 ENCOUNTER — Telehealth: Payer: Self-pay | Admitting: Family Medicine

## 2021-08-31 NOTE — Telephone Encounter (Signed)
Pt. Called and stated she hasn't heard anything back regarding PT. She wants to make sure that the request is getting worked on. She states she will be traveling and if you cant reach her, leave her a VM.

## 2021-08-31 NOTE — Telephone Encounter (Signed)
Looks like this was sent to [facility], can you tell me which location and ill get her to call?

## 2021-09-01 NOTE — Telephone Encounter (Signed)
Patient given information and she will call.

## 2021-09-15 ENCOUNTER — Other Ambulatory Visit: Payer: Self-pay | Admitting: Family Medicine

## 2021-09-16 ENCOUNTER — Encounter: Payer: Self-pay | Admitting: Adult Health

## 2021-09-16 ENCOUNTER — Other Ambulatory Visit: Payer: Self-pay

## 2021-09-16 ENCOUNTER — Ambulatory Visit (INDEPENDENT_AMBULATORY_CARE_PROVIDER_SITE_OTHER): Payer: 59 | Admitting: Adult Health

## 2021-09-16 VITALS — BP 100/60 | HR 85 | Temp 98.8°F | Ht 66.0 in | Wt 124.4 lb

## 2021-09-16 DIAGNOSIS — R053 Chronic cough: Secondary | ICD-10-CM

## 2021-09-16 DIAGNOSIS — K219 Gastro-esophageal reflux disease without esophagitis: Secondary | ICD-10-CM | POA: Diagnosis not present

## 2021-09-16 DIAGNOSIS — J310 Chronic rhinitis: Secondary | ICD-10-CM | POA: Insufficient documentation

## 2021-09-16 NOTE — Assessment & Plan Note (Signed)
GERD diet  Pepcid At bedtime  , when cough free can change to As needed

## 2021-09-16 NOTE — Progress Notes (Signed)
'@Patient'  ID: [name], female    DOB: 1971-07-22, 50 y.o.   MRN: [id number]  Chief Complaint  Patient presents with   Follow-up    Referring provider: Mosie Lukes, MD  HPI: 50 year old female never smoker seen for pulmonary consult May 19, 2021 for chronic cough x1 year Patient is a TV anchor, Body language expert   TEST/EVENTS :  Chest x-ray January 05, 2021 showed clear lungs.    01/05/21 Pulmonary function testing that showed normal lung function with no airflow obstruction or restriction and a normal diffusing capacity.  FEV1 122%, ratio 90, FVC 107%.,  No significant bronchodilator response, DLCO 112%.   Cough workup :   No known occupational exposures.  Does travel domestically.  Denies basement or hot tub.  Does not smoke.  No birds or chickens.  Allergen panel July 22, 2021 negative, IgE 17, absolute eosinophils 0, BNP 7  09/16/2021 Follow up : Chronic Cough  Patient returns for a 74-monthfollow-up.  Patient is being followed for chronic cough has been present for over a year.  Work-up has been unrevealing with normal PFTs.  Allergen panel was negative.  IgE was low at 17.  Absolute eosinophil counts were 0.  Normal BNP.  ENT evaluation with laryngoscopy showing normal vocal cords and mobility.  Mild edema of the arytenoid mucosa.  Patient was treated for an upper airway cough syndrome with traditional triggers of postnasal drainage and reflux.  She has been recommended to take Allegra daily,  chlor tabs 4 mg at bedtime, Pepcid at bedtime, Delsym and Tessalon for cough control.  And albuterol as needed.  Since last visit patient is feeling 95% better, cough has improved . Has occasional cough and nasal drainage . Hard to predict when it will occur.  Wants a referral to Allergist.  Patient denies any chest pain orthopnea PND or increased leg swelling. Patient says she is feeling well.  [place]    No Known  Allergies  Immunization History  Administered Date(s) Administered   Hepatitis A 10/21/1998, 03/07/2010   Hepatitis B 07/22/1998, 09/08/1998, 01/19/1999   Influenza Inj Mdck Quad Pf 07/29/2019   Influenza Split 07/31/2011   Influenza,inj,Quad PF,6+ Mos 07/20/2013, 07/05/2014, 07/16/2015, 06/24/2016, 07/25/2017, 09/08/2018, 08/17/2020, 07/22/2021   Influenza-Unspecified 03/31/2010   MMR 03/14/2018   PFIZER(Purple Top)SARS-COV-2 Vaccination 02/03/2020, 03/02/2020, 10/02/2020   Td 07/06/2005   Tdap 07/06/2005, 09/07/2013   Typhoid Inactivated 03/31/2010    Past Medical History:  Diagnosis Date   Allergy    Alopecia    Anxiety and depression 07/31/2011   Chest pain 09/22/10   Sharp pains in left breast & sometimes in the right breast as well. Random in occurence & nonexertional. Also has occasional SOB & DOE when going up stairs & occasionally when working out. Nuclear Stress Treadmill 09/29/10 -    HSV-2 (herpes simplex virus 2) infection    IBS (irritable bowel syndrome)    Interstitial cystitis    Multiple allergies 07/02/2011   [place]romuscular disorder [place]    past hx dx of Fibromyalgia   Otitis media 03/16/2012   Perimenopausal 07/22/2013   PONV (postoperative nausea and vomiting)    Poor concentration 07/02/2011   Shoulder pain, left 04/11/2012   SOB (shortness of breath) 09/22/10   ECHO 09/29/10 - NL LVF, trivial PE, trivial TR/PR.   UTI (lower urinary tract infection) 08/28/2011    Tobacco History: Social History   Tobacco Use  Smoking  Status Never  Smokeless Tobacco Never   Counseling given: Not Answered   Outpatient Medications Prior to Visit  Medication Sig Dispense Refill   albuterol (VENTOLIN HFA) 108 (90 Base) MCG/ACT inhaler Inhale 1-2 puffs into the lungs every 6 (six) hours as needed for wheezing or shortness of breath. 8 g 1   amphetamine-dextroamphetamine (ADDERALL) 20 MG tablet Take 1 tablet (20 mg total) by mouth 2 (two) times daily. 60 tablet 0    benzonatate (TESSALON) 200 MG capsule Take 1 capsule (200 mg total) by mouth 3 (three) times daily as needed for cough. 30 capsule 1   estradiol (ESTRACE) 0.5 MG tablet Take 2 tablets (1 mg total) by mouth daily. 180 tablet 4   famotidine (PEPCID) 40 MG tablet TAKE 1 TABLET BY MOUTH EVERYDAY AT BEDTIME 90 tablet 1   fexofenadine (ALLEGRA) 180 MG tablet Take 180 mg by mouth daily.       finasteride (PROSCAR) 5 MG tablet TAKE 1 TABLET BY MOUTH EVERY DAY 90 tablet 1   flunisolide (NASALIDE) 25 MCG/ACT (0.025%) SOLN PLEASE SPECIFY DIRECTIONS, REFILLS AND QUANTITY 75 mL 11   hydrOXYzine (ATARAX/VISTARIL) 10 MG tablet Take 1-2 tablets (10-20 mg total) by mouth every 8 (eight) hours as needed (pain, insomnia). 90 tablet 1   Methen-Hyosc-Meth Blue-Na Phos (ME/NAPHOS/MB/HYO1) 81.6 MG TABS Take 1 tablet by mouth every 6 (six) hours as needed (pain/buring). 60 tablet 2   progesterone (PROMETRIUM) 100 MG capsule Take 1 capsule (100 mg total) by mouth at bedtime. 90 capsule 4   Spacer/Aero-Holding Chambers (AEROCHAMBER MINI CHAMBER) DEVI Use as needed with albuterol HFA 1 each 0   spironolactone (ALDACTONE) 100 MG tablet Take 100 mg by mouth daily.     tretinoin (RETIN-A) 0.05 % cream Apply topically.     UNABLE TO FIND cocovia     valACYclovir (VALTREX) 500 MG tablet Take 1 tablet (500 mg total) by mouth daily. 90 tablet 3   XIIDRA 5 % SOLN      cetirizine (ZYRTEC) 10 MG tablet Take 1 tablet (10 mg total) by mouth 2 (two) times daily as needed for allergies. (Patient not taking: Reported on 09/16/2021) 60 tablet 2   Cholecalciferol 50 MCG (2000 UT) TABS Take 1 tablet by mouth daily. (Patient not taking: Reported on 09/16/2021)     estradiol (ESTRACE VAGINAL) 0.1 MG/GM vaginal cream Insert 1 gram vaginally twice weekly. (Patient not taking: Reported on 09/16/2021) 42.5 g 3   phenazopyridine (PYRIDIUM) 200 MG tablet Take 1 tablet (200 mg total) by mouth 3 (three) times daily as needed for pain (2 days at a  time). (Patient not taking: Reported on 09/16/2021) 20 tablet 1   No facility-administered medications prior to visit.     Review of Systems:   Constitutional:   No  weight loss, night sweats,  Fevers, chills, fatigue, or  lassitude.  HEENT:   No headaches,  Difficulty swallowing,  Tooth/dental problems, or  Sore throat,                No sneezing, itching, ear ache,  +nasal congestion, post nasal drip,   CV:  No chest pain,  Orthopnea, PND, swelling in lower extremities, anasarca, dizziness, palpitations, syncope.   GI  No heartburn, indigestion, abdominal pain, nausea, vomiting, diarrhea, change in bowel habits, loss of appetite, bloody stools.   Resp: No shortness of breath with exertion or at rest.  No excess mucus, ,  No coughing up of blood.  No change in color of  mucus.  No wheezing.  No chest wall deformity  Skin: no rash or lesions.  GU: no dysuria, change in color of urine, no urgency or frequency.  No flank pain, no hematuria   MS:  No joint pain or swelling.  No decreased range of motion.  No back pain.    Physical Exam  BP 100/60 (BP Location: Left Arm, Patient Position: Sitting, Cuff Size: Normal)   Pulse 85   Temp 98.8 F (37.1 C) (Oral)   Ht '5\' 6"'  (1.676 m)   Wt 124 lb 6.4 oz (56.4 kg)   LMP 07/03/2015   SpO2 99%   BMI 20.08 kg/m   GEN: A/Ox3; pleasant , NAD, well nourished    HEENT:  [place]/AT,  EACs-clear, TMs-wnl, NOSE-clear, THROAT-clear, no lesions, no postnasal drip or exudate noted.   NECK:  Supple w/ fair ROM; no JVD; normal carotid impulses w/o bruits; no thyromegaly or nodules palpated; no lymphadenopathy.    RESP  Clear  P & A; w/o, wheezes/ rales/ or rhonchi. no accessory muscle use, no dullness to percussion  CARD:  RRR, no m/r/g, no peripheral edema, pulses intact, no cyanosis or clubbing.  GI:   Soft & nt; nml bowel sounds; no organomegaly or masses detected.   Musco: Warm bil, no deformities or joint swelling noted.   Neuro: alert, no  focal deficits noted.    Skin: Warm, no lesions or rashes    Lab Results:  CBC    Component Value Date/Time   WBC 5.6 07/22/2021 0955   RBC 4.69 07/22/2021 0955   HGB 14.0 07/22/2021 0955   HCT 41.8 07/22/2021 0955   PLT 194.0 07/22/2021 0955   MCV 89.1 07/22/2021 0955   MCH 30.4 12/25/2018 1441   MCHC 33.5 07/22/2021 0955   RDW 13.1 07/22/2021 0955   LYMPHSABS 1.5 07/22/2021 0955   MONOABS 0.3 07/22/2021 0955   EOSABS 0.0 07/22/2021 0955   BASOSABS 0.0 07/22/2021 0955    BMET    Component Value Date/Time   NA 137 07/22/2021 0955   K 4.8 07/22/2021 0955   CL 103 07/22/2021 0955   CO2 28 07/22/2021 0955   GLUCOSE 104 (H) 07/22/2021 0955   BUN 24 (H) 07/22/2021 0955   CREATININE 0.79 07/22/2021 0955   CREATININE 0.79 12/23/2017 0932   CALCIUM 9.8 07/22/2021 0955    BNP No results found for: BNP  ProBNP    Component Value Date/Time   PROBNP 7.0 07/22/2021 0955    Imaging: No results found.    PFT Results Latest Ref Rng & Units 06/08/2021  FVC-Pre L 4.01  FVC-Predicted Pre % 107  FVC-Post L 3.83  FVC-Predicted Post % 102  Pre FEV1/FVC % % 90  Post FEV1/FCV % % 63  FEV1-Pre L 3.61  FEV1-Predicted Pre % 122  FEV1-Post L 2.43  DLCO uncorrected ml/min/mmHg 25.05  DLCO UNC% % 112  DLCO corrected ml/min/mmHg 25.05  DLCO COR %Predicted % 112  DLVA Predicted % 101  TLC L 6.68  TLC % Predicted % 124  RV % Predicted % 140    No results found for: NITRICOXIDE      Assessment & Plan:   Chronic cough Upper airway cough syndrome improved with treatment aimed at cough suppression, chronic rhinitis and GERD patient's had significant improvement.  Work-up has been unrevealing with normal pulmonary function testing.  Previous chest x-ray was clear.  Allergen panel was negative.  IgE and eosinophils were unrevealing. Allergy referral per request. Patient is continue  on current regimen.  Advised patient as she is improving and becoming cough free.  Can start  to decrease her maintenance regimen.  Can change Allegra, Delsym, Tessalon and chlor tabs to as needed slowly over time.  Plan  Patient Instructions  Continue Allegra 175m in am  Continue on Chlorpheniramine 412mAt bedtime As needed  drainage  (over the counter )  Deslym 2 tsp Twice daily  for cough As needed  (over the counter)  Tessalon Three times a day  for cough as needed  Pepcid 2071mt bedtime  .  Albuterol Inhaler 1-2 puffs every 6hr as needed for wheezing /shortness of breath .  Referral to Allergist .  Once your cough is resolved can start to decrease above meds and change to as needed only .  Follow up with Dr. EllLoanne Drilling Lyden Redner NP in 4 month and As needed  Please contact office for sooner follow up if symptoms do not improve or worsen or seek emergency care       GERD (gastroesophageal reflux disease) GERD diet  Pepcid At bedtime  , when cough free can change to As needed    Chronic rhinitis Continue on current regimen  When cough free can change Allegra and chlor tabs to as needed Allergy referral per patient request  Plan  Patient Instructions  Continue Allegra 180m75m am  Continue on Chlorpheniramine 4mg 12mbedtime As needed  drainage  (over the counter )  Deslym 2 tsp Twice daily  for cough As needed  (over the counter)  Tessalon Three times a day  for cough as needed  Pepcid 20mg 101medtime  .  Albuterol Inhaler 1-2 puffs every 6hr as needed for wheezing /shortness of breath .  Referral to Allergist .  Once your cough is resolved can start to decrease above meds and change to as needed only .  Follow up with Dr. EllisoLoanne Drillingrrett NP in 4 month and As needed  Please contact office for sooner follow up if symptoms do not improve or worsen or seek emergency care         Kyrah Schiro Rexene Edison1/16/2022

## 2021-09-16 NOTE — Assessment & Plan Note (Signed)
Continue on current regimen  When cough free can change Allegra and chlor tabs to as needed Allergy referral per patient request  Plan  Patient Instructions  Continue Allegra 180mg  in am  Continue on Chlorpheniramine 4mg  At bedtime As needed  drainage  (over the counter )  Deslym 2 tsp Twice daily  for cough As needed  (over the counter)  Tessalon Three times a day  for cough as needed  Pepcid 20mg  At bedtime  .  Albuterol Inhaler 1-2 puffs every 6hr as needed for wheezing /shortness of breath .  Referral to Allergist .  Once your cough is resolved can start to decrease above meds and change to as needed only .  Follow up with Dr. Loanne Drilling or Kamaron Deskins NP in 4 month and As needed  Please contact office for sooner follow up if symptoms do not improve or worsen or seek emergency care

## 2021-09-16 NOTE — Assessment & Plan Note (Signed)
Upper airway cough syndrome improved with treatment aimed at cough suppression, chronic rhinitis and GERD patient's had significant improvement.  Work-up has been unrevealing with normal pulmonary function testing.  Previous chest x-ray was clear.  Allergen panel was negative.  IgE and eosinophils were unrevealing. Allergy referral per request. Patient is continue on current regimen.  Advised patient as she is improving and becoming cough free.  Can start to decrease her maintenance regimen.  Can change Allegra, Delsym, Tessalon and chlor tabs to as needed slowly over time.  Plan  Patient Instructions  Continue Allegra 180mg  in am  Continue on Chlorpheniramine 4mg  At bedtime As needed  drainage  (over the counter )  Deslym 2 tsp Twice daily  for cough As needed  (over the counter)  Tessalon Three times a day  for cough as needed  Pepcid 20mg  At bedtime  .  Albuterol Inhaler 1-2 puffs every 6hr as needed for wheezing /shortness of breath .  Referral to Allergist .  Once your cough is resolved can start to decrease above meds and change to as needed only .  Follow up with Dr. Loanne Drilling or Morelia Cassells NP in 4 month and As needed  Please contact office for sooner follow up if symptoms do not improve or worsen or seek emergency care

## 2021-09-16 NOTE — Patient Instructions (Addendum)
Continue Allegra 180mg  in am  Continue on Chlorpheniramine 4mg  At bedtime As needed  drainage  (over the counter )  Deslym 2 tsp Twice daily  for cough As needed  (over the counter)  Tessalon Three times a day  for cough as needed  Pepcid 20mg  At bedtime  .  Albuterol Inhaler 1-2 puffs every 6hr as needed for wheezing /shortness of breath .  Referral to Allergist .  Once your cough is resolved can start to decrease above meds and change to as needed only .  Follow up with Dr. Loanne Drilling or Aireona Torelli NP in 4 month and As needed  Please contact office for sooner follow up if symptoms do not improve or worsen or seek emergency care

## 2021-09-22 ENCOUNTER — Encounter: Payer: 59 | Admitting: Family Medicine

## 2021-09-23 ENCOUNTER — Other Ambulatory Visit: Payer: Self-pay | Admitting: Family Medicine

## 2021-10-09 ENCOUNTER — Encounter: Payer: 59 | Admitting: Family

## 2021-10-13 ENCOUNTER — Ambulatory Visit (INDEPENDENT_AMBULATORY_CARE_PROVIDER_SITE_OTHER): Payer: 59 | Admitting: Family

## 2021-10-13 ENCOUNTER — Encounter: Payer: Self-pay | Admitting: Family

## 2021-10-13 VITALS — BP 95/65 | HR 73 | Temp 97.9°F | Resp 16 | Ht 66.0 in | Wt 122.6 lb

## 2021-10-13 DIAGNOSIS — E785 Hyperlipidemia, unspecified: Secondary | ICD-10-CM | POA: Diagnosis not present

## 2021-10-13 DIAGNOSIS — Z0000 Encounter for general adult medical examination without abnormal findings: Secondary | ICD-10-CM | POA: Diagnosis not present

## 2021-10-13 LAB — COMPREHENSIVE METABOLIC PANEL
ALT: 11 U/L (ref 0–35)
AST: 13 U/L (ref 0–37)
Albumin: 4.7 g/dL (ref 3.5–5.2)
Alkaline Phosphatase: 41 U/L (ref 39–117)
BUN: 18 mg/dL (ref 6–23)
CO2: 30 mEq/L (ref 19–32)
Calcium: 9.9 mg/dL (ref 8.4–10.5)
Chloride: 103 mEq/L (ref 96–112)
Creatinine, Ser: 0.68 mg/dL (ref 0.40–1.20)
GFR: 99.82 mL/min (ref >60.00)
Glucose, Bld: 92 mg/dL (ref 70–99)
Potassium: 4.4 mEq/L (ref 3.5–5.1)
Sodium: 139 mEq/L (ref 135–145)
Total Bilirubin: 0.7 mg/dL (ref 0.2–1.2)
Total Protein: 6.8 g/dL (ref 6.0–8.3)

## 2021-10-13 LAB — CBC WITH DIFFERENTIAL/PLATELET
Basophils Absolute: 0 10*3/uL (ref 0.0–0.1)
Basophils Relative: 0.5 % (ref 0.0–3.0)
Eosinophils Absolute: 0 10*3/uL (ref 0.0–0.7)
Eosinophils Relative: 0.7 % (ref 0.0–5.0)
HCT: 40.9 % (ref 36.0–46.0)
Hemoglobin: 13.6 g/dL (ref 12.0–15.0)
Lymphocytes Relative: 28.2 % (ref 12.0–46.0)
Lymphs Abs: 1.5 10*3/uL (ref 0.7–4.0)
MCHC: 33.2 g/dL (ref 30.0–36.0)
MCV: 89.6 fl (ref 78.0–100.0)
Monocytes Absolute: 0.4 10*3/uL (ref 0.1–1.0)
Monocytes Relative: 6.6 % (ref 3.0–12.0)
Neutro Abs: 3.5 10*3/uL (ref 1.4–7.7)
Neutrophils Relative %: 64 % (ref 43.0–77.0)
Platelets: 192 10*3/uL (ref 150.0–400.0)
RBC: 4.56 Mil/uL (ref 3.87–5.11)
RDW: 12.4 % (ref 11.5–15.5)
WBC: 5.4 10*3/uL (ref 4.0–10.5)

## 2021-10-13 LAB — TSH: TSH: 0.87 u[IU]/mL (ref 0.35–5.50)

## 2021-10-13 LAB — LIPID PANEL
Cholesterol: 207 mg/dL — ABNORMAL HIGH (ref 0–200)
HDL: 60 mg/dL (ref >39.00)
LDL Cholesterol: 137 mg/dL — ABNORMAL HIGH (ref 0–99)
NonHDL: 146.71
Total CHOL/HDL Ratio: 3
Triglycerides: 51 mg/dL (ref 0.0–149.0)
VLDL: 10.2 mg/dL (ref 0.0–40.0)

## 2021-10-13 NOTE — Assessment & Plan Note (Signed)
Continue healthy diet and regular exercise. [place], pap mammo up to date. Recommended that she consider covid bivalent booster which she can obtain at the pharmacy downstairs.

## 2021-10-13 NOTE — Patient Instructions (Addendum)
Please visit the lab to get blood work done before leaving today.  Consider getting the updated bivalent Covid-19 booster at a later date.  Please forward me a copy of your Shingles vaccine once you have received the second dose.

## 2021-10-13 NOTE — Progress Notes (Signed)
Subjective:   By signing my name below, I, [name], attest that this documentation has been prepared under the direction and in the presence of [name], NP, 10/13/2021     Patient ID: [name], female    DOB: 12-24-1970, 50 y.o.   MRN: [id number]  Chief Complaint  Patient presents with   Annual Exam    Here for Annual Exam     HPI Patient is in today for a comprehensive annual physical exam.  She denies any fever, unexpected weight change, adenopathy, rash, hearing loss, ear pain, rhinorrhea, visual disturbances, eye pain, chest pain, leg swelling, nausea, vomitting, diarrhea, blood in stool, dysuria, frequency, myalgias, arthralgias, headaches, depression or anxiety.   Cholesterol: Her previous labs showed that her cholesterol was slightly elevated.  Lab Results  Component Value Date   CHOL 201 (H) 09/05/2020   HDL 61.10 09/05/2020   LDLCALC 127 (H) 09/05/2020   TRIG 66.0 09/05/2020   CHOLHDL 3 09/05/2020  Immunizations: She has already received her flu shot for this season. She is UTD on tetanus. She reports that she has received her first dose in the shingrix series. She has had three Covid-19 vaccines at this time. She is interested in getting the updated bivalent booster at a later date.  Diet: She notes that she maintains a fairly healthy diet but could improve.  Exercise: She reports that she exercises about two days a week. Colonoscopy: Last performed on 04/04/2020 and results showed a diminutive polyp in the proximal transverse colon. Repeat in 10 years.  Pap Smear: Her last health maintenance pap smear was performed on 03/12/2021 and found that she lacks endocervical cells. Repeat every year.  Mammogram: Last performed on 04/21/2021 and results were normal. Repeat in one year.  Dental: She is UTD on dental care. Vision: She is UTD on vision care.  Alcohol: She reports that she occasionally drinks. Drugs: She does not use  drugs. Tobacco: She does not use any tobacco products. SHx: She reports no new surgeries in the last year.  FMHx: She denies any changes to her family medical history at this time.   Health Maintenance Due  Topic Date Due   Zoster Vaccines- Shingrix (1 of 2) Never done   COVID-19 Vaccine (4 - Booster for [employer] series) 11/27/2020    Past Medical History:  Diagnosis Date   Allergy    Alopecia    Anxiety and depression 07/31/2011   Chest pain 09/22/10   Sharp pains in left breast & sometimes in the right breast as well. Random in occurence & nonexertional. Also has occasional SOB & DOE when going up stairs & occasionally when working out. Nuclear Stress Treadmill 09/29/10 -    HSV-2 (herpes simplex virus 2) infection    IBS (irritable bowel syndrome)    Interstitial cystitis    Multiple allergies 07/02/2011   Neuromuscular disorder ([place])    past hx dx of Fibromyalgia   Otitis media 03/16/2012   Perimenopausal 07/22/2013   PONV (postoperative nausea and vomiting)    Poor concentration 07/02/2011   Shoulder pain, left 04/11/2012   SOB (shortness of breath) 09/22/10   ECHO 09/29/10 - NL LVF, trivial PE, trivial TR/PR.   UTI (lower urinary tract infection) 08/28/2011    Past Surgical History:  Procedure Laterality Date   BREAST BIOPSY Right 03/12/2016   Procedure: breast exploration;  Surgeon: Jackolyn Confer, MD;  Location: [facility];  Service: General;  Laterality: Right;   BREAST ENHANCEMENT  SURGERY  2007   CESAREAN SECTION  2001 and 2004   x2   COLONOSCOPY  2007   LAPAROSCOPY     twice   RHINOPLASTY  2003   TONSILLECTOMY  1982   TUBAL LIGATION     UPPER GASTROINTESTINAL ENDOSCOPY  2011    Family History  Problem Relation Age of Onset   Heart disease Mother        MVP   Anxiety disorder Mother    Mitral valve prolapse Mother    Alzheimer's disease Mother    Colon polyps Mother    Hypertension Brother    Obesity Brother    Diabetes Brother        DM,  obese   Allergies Daughter    Allergies Son    Kidney disease Maternal Grandmother        uremia   Breast cancer Neg Hx    Colon cancer Neg Hx    Esophageal cancer Neg Hx    Rectal cancer Neg Hx    Stomach cancer Neg Hx     Social History   Socioeconomic History   Marital status: Divorced    Spouse name: Not on file   Number of children: Not on file   Years of education: Not on file   Highest education level: Not on file  Occupational History   Not on file  Tobacco Use   Smoking status: Never   Smokeless tobacco: Never  Vaping Use   Vaping Use: Never used  Substance and Sexual Activity   Alcohol use: Yes    Comment: occasional   Drug use: Never   Sexual activity: Yes    Partners: Male    Birth control/protection: Surgical    Comment: Tubal lig-1st intercourse 50 yo-More than 5 partners  Other Topics Concern   Not on file  Social History Narrative   Not on file   Social Determinants of Health   Financial Resource Strain: Not on file  Food Insecurity: Not on file  Transportation Needs: Not on file  Physical Activity: Not on file  Stress: Not on file  Social Connections: Not on file  Intimate Partner Violence: Not on file    Outpatient Medications Prior to Visit  Medication Sig Dispense Refill   albuterol (VENTOLIN HFA) 108 (90 Base) MCG/ACT inhaler Inhale 1-2 puffs into the lungs every 6 (six) hours as needed for wheezing or shortness of breath. 8 g 1   amphetamine-dextroamphetamine (ADDERALL) 20 MG tablet Take 1 tablet (20 mg total) by mouth 2 (two) times daily. 60 tablet 0   benzonatate (TESSALON) 200 MG capsule Take 1 capsule (200 mg total) by mouth 3 (three) times daily as needed for cough. 30 capsule 1   estradiol (ESTRACE) 0.5 MG tablet Take 2 tablets (1 mg total) by mouth daily. 180 tablet 4   famotidine (PEPCID) 40 MG tablet TAKE 1 TABLET BY MOUTH EVERYDAY AT BEDTIME 90 tablet 1   fexofenadine (ALLEGRA) 180 MG tablet Take 180 mg by mouth daily.        finasteride (PROSCAR) 5 MG tablet TAKE 1 TABLET BY MOUTH EVERY DAY 90 tablet 1   flunisolide (NASALIDE) 25 MCG/ACT (0.025%) SOLN PLEASE SPECIFY DIRECTIONS, REFILLS AND QUANTITY 75 mL 11   hydrOXYzine (ATARAX/VISTARIL) 10 MG tablet Take 1-2 tablets (10-20 mg total) by mouth every 8 (eight) hours as needed (pain, insomnia). 90 tablet 1   Methen-Hyosc-Meth Blue-Na Phos (ME/NAPHOS/MB/HYO1) 81.6 MG TABS Take 1 tablet by mouth every 6 (six) hours as  needed (pain/buring). 60 tablet 2   progesterone (PROMETRIUM) 100 MG capsule Take 1 capsule (100 mg total) by mouth at bedtime. 90 capsule 4   Spacer/Aero-Holding Chambers (AEROCHAMBER MINI CHAMBER) DEVI Use as needed with albuterol HFA 1 each 0   spironolactone (ALDACTONE) 100 MG tablet Take 100 mg by mouth daily.     tretinoin (RETIN-A) 0.05 % cream Apply topically.     UNABLE TO FIND cocovia     valACYclovir (VALTREX) 500 MG tablet Take 1 tablet (500 mg total) by mouth daily. 90 tablet 3   XIIDRA 5 % SOLN      No facility-administered medications prior to visit.    No Known Allergies  Review of Systems  Constitutional:  Negative for fever.       (-) unexpected weight changes (-) adenopathy  HENT:  Negative for ear pain and hearing loss.        (-) rhinorrhea  Eyes:  Negative for pain.       (-) visual disturbances  Respiratory:  Positive for cough.   Cardiovascular:  Negative for chest pain and leg swelling.  Gastrointestinal:  Positive for constipation (intermittent and resolved with increased water intake). Negative for blood in stool, diarrhea, nausea and vomiting.  Genitourinary:  Negative for dysuria and frequency.  Musculoskeletal:  Negative for joint pain and myalgias.  Skin:  Negative for rash.  Neurological:  Negative for headaches. Tingling: working with another provider for this and is improving. Psychiatric/Behavioral:  Negative for depression. The patient is not nervous/anxious.       Objective:    Physical  Exam Constitutional:      General: She is not in acute distress.    Appearance: Normal appearance. She is not ill-appearing.  HENT:     Head: Normocephalic and atraumatic.     Right Ear: Tympanic membrane, ear canal and external ear normal.     Left Ear: Tympanic membrane, ear canal and external ear normal.  Eyes:     Extraocular Movements: Extraocular movements intact.     Pupils: Pupils are equal, round, and reactive to light.     Comments: (-) nystagmus  Cardiovascular:     Rate and Rhythm: Normal rate and regular rhythm.     Heart sounds: Normal heart sounds. No murmur heard.   No gallop.  Pulmonary:     Effort: Pulmonary effort is normal. No respiratory distress.     Breath sounds: Normal breath sounds. No wheezing or rales.  Abdominal:     General: Bowel sounds are normal.     Palpations: Abdomen is soft.     Tenderness: There is no abdominal tenderness.  Musculoskeletal:     Comments: (+) 5/5 upper and lower extremity strength  Skin:    General: Skin is warm and dry.  Neurological:     Mental Status: She is alert and oriented to person, place, and time.  Psychiatric:        Behavior: Behavior normal.        Judgment: Judgment normal.    BP 95/65 (BP Location: Left Arm, Patient Position: Sitting, Cuff Size: Small)    Pulse 73    Temp 97.9 F (36.6 C) (Oral)    Resp 16    Ht _0  (1.676 m)    Wt 122 lb 9.6 oz (55.6 kg)    LMP 07/03/2015    SpO2 100%    BMI 19.79 kg/m  Wt Readings from Last 3 Encounters:  10/13/21 122 lb 9.6 oz (55.6  kg)  09/16/21 124 lb 6.4 oz (56.4 kg)  07/31/21 128 lb 2 oz (58.1 kg)       Assessment & Plan:   Problem List Items Addressed This Visit       Unprioritized   Preventative health care    Continue healthy diet and regular exercise. [place], pap mammo up to date. Recommended that she consider covid bivalent booster which she can obtain at the pharmacy downstairs.       Relevant Orders   CBC with Differential/Platelet   TSH    Hyperlipidemia - Primary   Relevant Orders   Lipid panel   Comp Met (CMET)   No orders of the defined types were placed in this encounter.   I, [name], NP, personally preformed the services described in this documentation.  All medical record entries made by the scribe were at my direction and in my presence.  I have reviewed the chart and discharge instructions (if applicable) and agree that the record reflects my personal performance and is accurate and complete. 10/13/2021  I,[name],acting as a Education administrator for Nance Pear, NP.,have documented all relevant documentation on the behalf of Nance Pear, NP,as directed by  Nance Pear, NP while in the presence of Nance Pear, NP.  Nance Pear, NP

## 2021-10-20 ENCOUNTER — Other Ambulatory Visit: Payer: Self-pay | Admitting: Family Medicine

## 2021-10-20 DIAGNOSIS — Z889 Allergy status to unspecified drugs, medicaments and biological substances status: Secondary | ICD-10-CM

## 2021-12-08 ENCOUNTER — Other Ambulatory Visit: Payer: Self-pay | Admitting: Adult Health

## 2021-12-21 NOTE — Progress Notes (Signed)
GYNECOLOGY  VISIT   HPI: 51 y.o.   Divorced  Caucasian  female   [phone] with Patient's last menstrual period was 07/03/2015.   here for left breast mass x3 days ago.   No pain.   No trauma.   No recent Covid vaccinations or recent vaccination.   Has bilateral breast implants.  No problems there.   Status post right breast biopsy in 2017, benign.   She is asking about her last pap.   GYNECOLOGIC HISTORY: Patient's last menstrual period was 07/03/2015. Contraception:  PMP Menopausal hormone therapy:  Estrace and Progesterone Last mammogram:  04-21-21 Implants/Neg/Birads1 Last pap smear:   03-12-21 LSIL:Neg HR HPV, 12-22-17 Neg:Neg HR HPV2-20-18, Neg        OB History     Gravida  3   Para  2   Term  2   Preterm      AB  1   Living  2      SAB      IAB      Ectopic      Multiple      Live Births                 Patient Active Problem List   Diagnosis Date Noted   Chronic rhinitis 09/16/2021   Chronic cough 12/22/2020   Vaginitis 09/04/2020   HSV infection 09/04/2020   Preventative health care 09/04/2020   Urinary hesitancy 06/05/2020   Pelvic floor dysfunction 06/05/2020   Nausea 03/02/2020   Alopecia 06/27/2019   Hyperlipidemia 12/27/2018   Neck pain on right side 12/02/2014   Fatigue 01/27/2014   Perimenopausal 07/22/2013   Otitis media 03/16/2012   Palpitations 02/14/2012   Anxiety and depression 07/31/2011   Vitamin D deficiency 07/02/2011   Multiple allergies 07/02/2011   Interstitial cystitis    IBS (irritable bowel syndrome)    GERD (gastroesophageal reflux disease)    Premature atrial contractions    Adult ADHD     Past Medical History:  Diagnosis Date   Allergy    Alopecia    Anxiety and depression 07/31/2011   Chest pain 09/22/10   Sharp pains in left breast & sometimes in the right breast as well. Random in occurence & nonexertional. Also has occasional SOB & DOE when going up stairs & occasionally when working out.  Nuclear Stress Treadmill 09/29/10 -    HSV-2 (herpes simplex virus 2) infection    IBS (irritable bowel syndrome)    Interstitial cystitis    Multiple allergies 07/02/2011   Neuromuscular disorder ([place])    past hx dx of Fibromyalgia   Otitis media 03/16/2012   Perimenopausal 07/22/2013   PONV (postoperative nausea and vomiting)    Poor concentration 07/02/2011   Shoulder pain, left 04/11/2012   SOB (shortness of breath) 09/22/10   ECHO 09/29/10 - NL LVF, trivial PE, trivial TR/PR.   UTI (lower urinary tract infection) 08/28/2011    Past Surgical History:  Procedure Laterality Date   BREAST BIOPSY Right 03/12/2016   Procedure: breast exploration;  Surgeon: Jackolyn Confer, MD;  Location: [facility];  Service: General;  Laterality: Right;   BREAST ENHANCEMENT SURGERY  2007   CESAREAN SECTION  2001 and 2004   x2   COLONOSCOPY  2007   LAPAROSCOPY     twice   RHINOPLASTY  2003   [place]   TUBAL LIGATION     UPPER GASTROINTESTINAL ENDOSCOPY  2011    Current Outpatient Medications  Medication Sig Dispense Refill   albuterol (VENTOLIN HFA) 108 (90 Base) MCG/ACT inhaler Inhale 1-2 puffs into the lungs every 6 (six) hours as needed for wheezing or shortness of breath. 8 g 1   amphetamine-dextroamphetamine (ADDERALL) 20 MG tablet Take 1 tablet (20 mg total) by mouth 2 (two) times daily. 60 tablet 0   benzonatate (TESSALON) 200 MG capsule Take 1 capsule (200 mg total) by mouth 3 (three) times daily as needed for cough. 30 capsule 1   estradiol (ESTRACE) 0.5 MG tablet Take 2 tablets (1 mg total) by mouth daily. (Patient taking differently: Take 0.5 mg by mouth daily. Take one tablet (0.5 mg) by mouth daily.) 180 tablet 4   famotidine (PEPCID) 40 MG tablet TAKE 1 TABLET BY MOUTH EVERYDAY AT BEDTIME 90 tablet 1   fexofenadine (ALLEGRA) 180 MG tablet TAKE 1 TABLET BY MOUTH EVERY DAY 90 tablet 1   finasteride (PROSCAR) 5 MG tablet TAKE 1 TABLET BY MOUTH EVERY DAY 90  tablet 1   flunisolide (NASALIDE) 25 MCG/ACT (0.025%) SOLN Place 2 sprays into the nose 2 (two) times daily as needed. USE AS DIRECTED 75 mL 3   hydrOXYzine (ATARAX/VISTARIL) 10 MG tablet Take 1-2 tablets (10-20 mg total) by mouth every 8 (eight) hours as needed (pain, insomnia). 90 tablet 1   meloxicam (MOBIC) 15 MG tablet Take 15 mg by mouth daily as needed.     Methen-Hyosc-Meth Blue-Na Phos (ME/NAPHOS/MB/HYO1) 81.6 MG TABS Take 1 tablet by mouth every 6 (six) hours as needed (pain/buring). 60 tablet 2   nitrofurantoin (MACRODANTIN) 50 MG capsule Take by mouth.     progesterone (PROMETRIUM) 100 MG capsule Take 1 capsule (100 mg total) by mouth at bedtime. 90 capsule 4   Spacer/Aero-Holding Chambers (AEROCHAMBER MINI CHAMBER) DEVI Use as needed with albuterol HFA 1 each 0   spironolactone (ALDACTONE) 100 MG tablet Take 100 mg by mouth daily.     traMADol (ULTRAM) 50 MG tablet Take by mouth.     tretinoin (RETIN-A) 0.05 % cream Apply topically.     UNABLE TO FIND cocovia     valACYclovir (VALTREX) 500 MG tablet Take 1 tablet (500 mg total) by mouth daily. 90 tablet 3   VALIUM 10 MG tablet 10 mg at bedtime.     XIIDRA 5 % SOLN      No current facility-administered medications for this visit.     ALLERGIES: Patient has no known allergies.  Family History  Problem Relation Age of Onset   Heart disease Mother        MVP   Anxiety disorder Mother    Mitral valve prolapse Mother    Alzheimer's disease Mother    Colon polyps Mother    Hypertension Brother    Obesity Brother    Diabetes Brother        DM, obese   Allergies Daughter    Allergies Son    Kidney disease Maternal Grandmother        uremia   Breast cancer Neg Hx    Colon cancer Neg Hx    Esophageal cancer Neg Hx    Rectal cancer Neg Hx    Stomach cancer Neg Hx     Social History   Socioeconomic History   Marital status: Divorced    Spouse name: Not on file   Number of children: Not on file   Years of education:  Not on file   Highest education level: Not on file  Occupational History  Not on file  Tobacco Use   Smoking status: Never   Smokeless tobacco: Never  Vaping Use   Vaping Use: Never used  Substance and Sexual Activity   Alcohol use: Yes    Comment: occasional   Drug use: Never   Sexual activity: Yes    Partners: Male    Birth control/protection: Surgical    Comment: Tubal lig-1st intercourse 51 yo-More than 5 partners  Other Topics Concern   Not on file  Social History Narrative   Not on file   Social Determinants of Health   Financial Resource Strain: Not on file  Food Insecurity: Not on file  Transportation Needs: Not on file  Physical Activity: Not on file  Stress: Not on file  Social Connections: Not on file  Intimate Partner Violence: Not on file    Review of Systems  Skin:        Left breast mass  All other systems reviewed and are negative.  PHYSICAL EXAMINATION:    BP 110/68    Pulse 83    Ht 5' 5.5" (1.664 m)    Wt 129 lb (58.5 kg)    LMP 07/03/2015    SpO2 98%    BMI 21.14 kg/m     General appearance: alert, cooperative and appears stated age Neck: no adenopathy   Breasts: right - normal appearance, implant present, no masses or tenderness, No nipple retraction or dimpling, No nipple discharge or bleeding, No axillary or supraclavicular adenopathy Left - normal appearance, implant present, 3 mm firm mass noted 3 cm from the nipple at the 2 - 3:00 position, No nipple retraction or dimpling, No nipple discharge or bleeding, No axillary or supraclavicular adenopathy   Chaperone was present for exam:  Lovena Le, CMA  ASSESSMENT  Left breast mass.  Bilateral breast implants.  LGSIL pap.  Negative HR HPV.  PLAN  Will proceed with dx left mammogram and [facility]. We reviewed the ASCCP guidelines for LGSIL pap and negative HR HPV. She will have her next pap and HR HPV testing in May, 2023.    An After Visit  Summary was printed and given to the patient.  27 min  total time was spent for this patient encounter, including preparation, face-to-face counseling with the patient, coordination of care, and documentation of the encounter.

## 2021-12-22 ENCOUNTER — Encounter: Payer: Self-pay | Admitting: Obstetrics and Gynecology

## 2021-12-22 ENCOUNTER — Telehealth: Payer: Self-pay | Admitting: Obstetrics and Gynecology

## 2021-12-22 ENCOUNTER — Other Ambulatory Visit: Payer: Self-pay

## 2021-12-22 ENCOUNTER — Ambulatory Visit (INDEPENDENT_AMBULATORY_CARE_PROVIDER_SITE_OTHER): Payer: Managed Care, Other (non HMO) | Admitting: Obstetrics and Gynecology

## 2021-12-22 VITALS — BP 110/68 | HR 83 | Ht 65.5 in | Wt 129.0 lb

## 2021-12-22 DIAGNOSIS — R87612 Low grade squamous intraepithelial lesion on cytologic smear of cervix (LGSIL): Secondary | ICD-10-CM

## 2021-12-22 DIAGNOSIS — N6321 Unspecified lump in the left breast, upper outer quadrant: Secondary | ICD-10-CM

## 2021-12-22 NOTE — Telephone Encounter (Signed)
Orders placed at [facility]. Patient scheduled on 01/14/22 @ 8:20am , arrival at 8am. My chart message sent.

## 2021-12-22 NOTE — Telephone Encounter (Signed)
Please schedule dx left mammogram and left breast/axillary [place] at the [facility] for my patient.   She has a left breast lump, about 3 mm at the 2 - 3:00 position, that is about 3 cm from the nipple.   She has bilateral implants.   Please schedule first available and place on a cancellation list.

## 2022-01-01 IMAGING — MR MR ANKLE*L* W/O CM
6 series · 35 of 40 positions shown · non-contrast
Comparison: None.

CLINICAL DATA: Left foot and ankle pain status post fall.

EXAM:
MRI OF THE LEFT FOOT WITHOUT CONTRAST
MRI OF THE LEFT ANKLE WITHOUT CONTRAST
TECHNIQUE: Multiplanar, multisequence MR imaging of the left foot was
performed. No intravenous contrast was administered.
Multiplanar, multisequence MR imaging of the left ankle was

[Series 5: T2 fat-sat · axial · 3.0mm · 0.50mm/px · z∈[-103,+17]mm · 8 of 31 slices shown (1 of 2)]
[im 1/31]
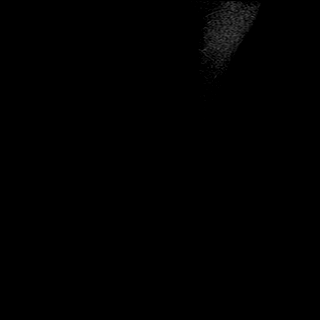
[im 5/31]
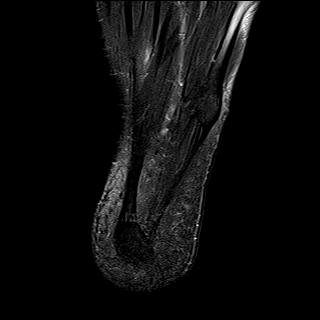
[im 9/31]
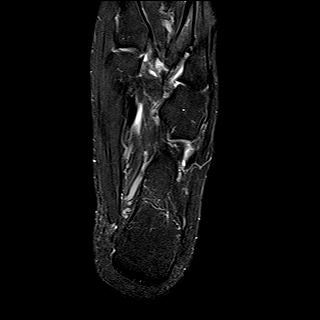
[im 13/31]
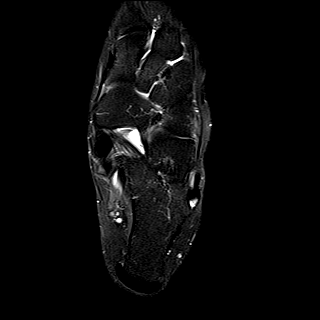
[im 18/31]
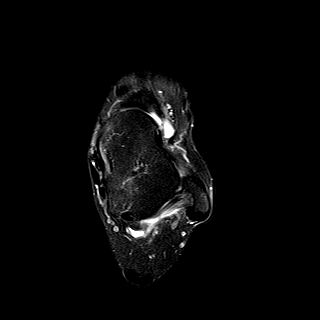
[im 22/31]
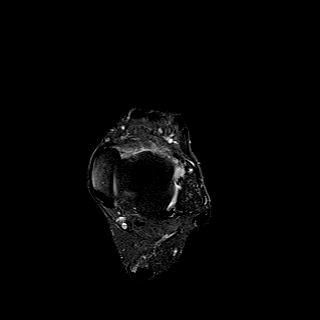
[im 26/31]
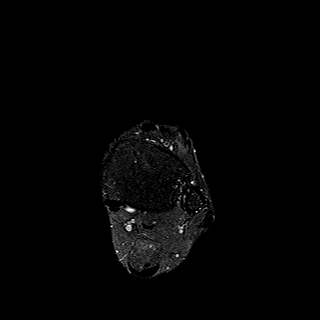
[im 31/31]
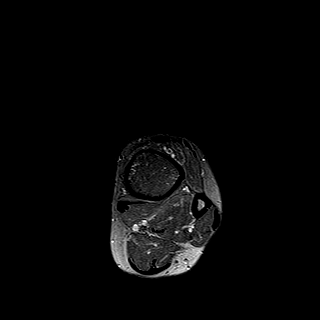

[Series 6: PD fat-sat · axial · 3.0mm · 0.42mm/px · z∈[-103,+17]mm · 8 of 32 slices shown]
[im 1/32]
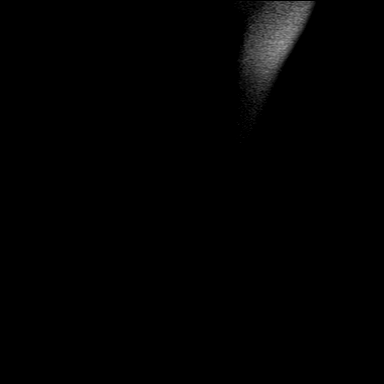
[im 5/32]
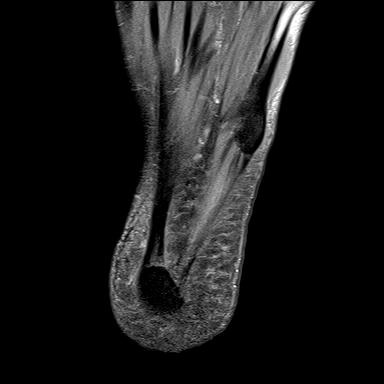
[im 9/32]
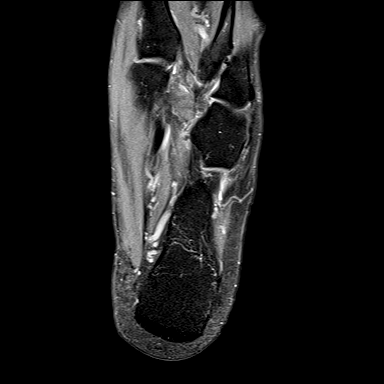
[im 14/32]
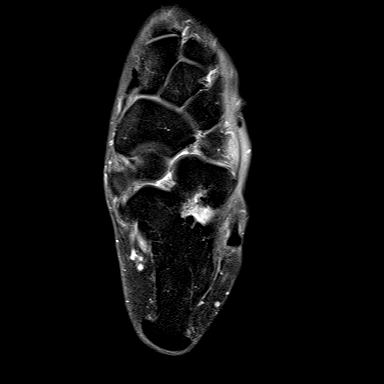
[im 18/32]
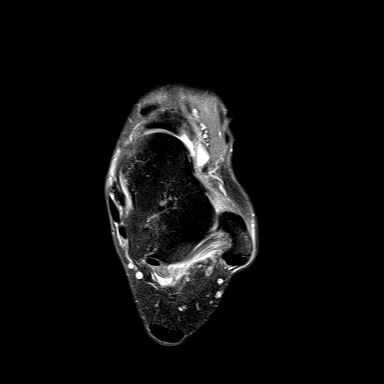
[im 23/32]
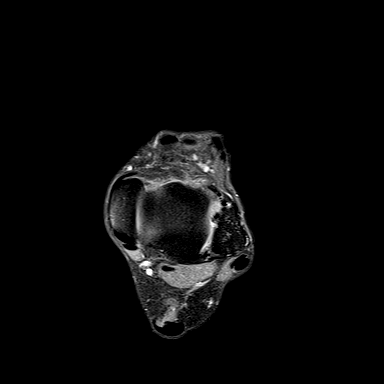
[im 27/32]
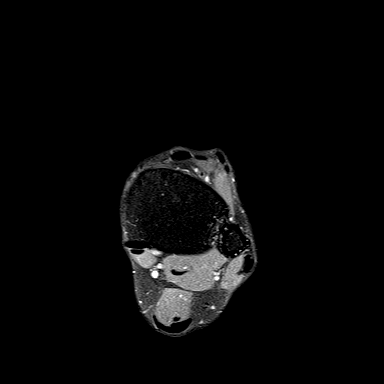
[im 32/32]
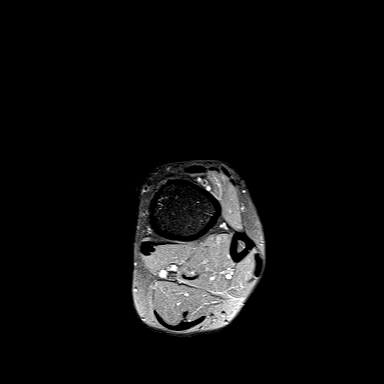

[Series 7: T1 · sagittal · 4.0mm · 0.56mm/px · 4 of 18 slices shown]
[im 1/18]
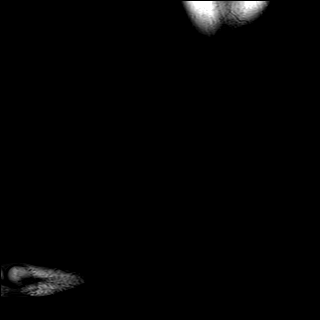
[im 6/18]
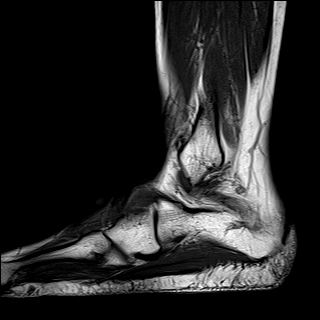
[im 12/18]
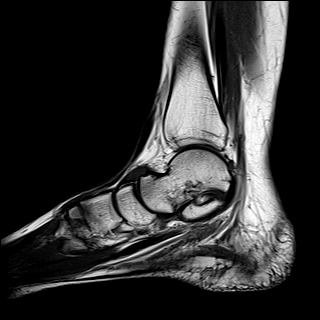
[im 18/18]
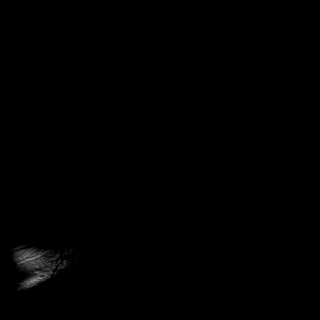

[Series 8: STIR · sagittal · 4.0mm · 0.35mm/px · 4 of 18 slices shown (1 of 2)]
[im 1/18]
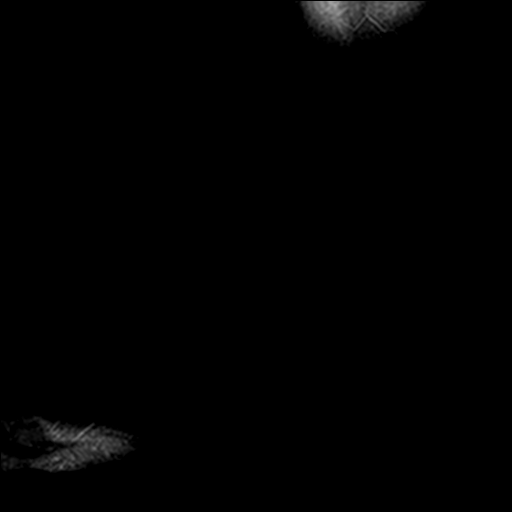
[im 6/18]
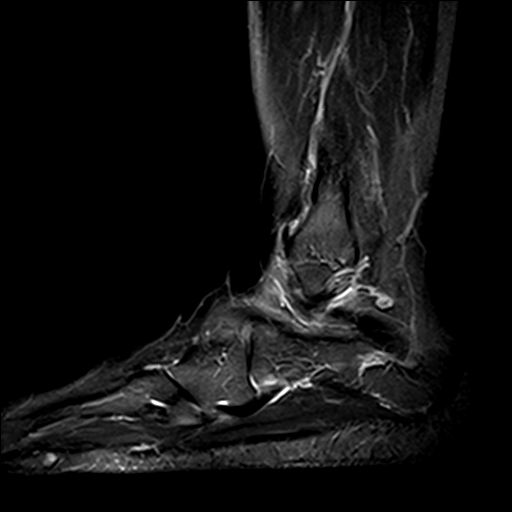
[im 12/18]
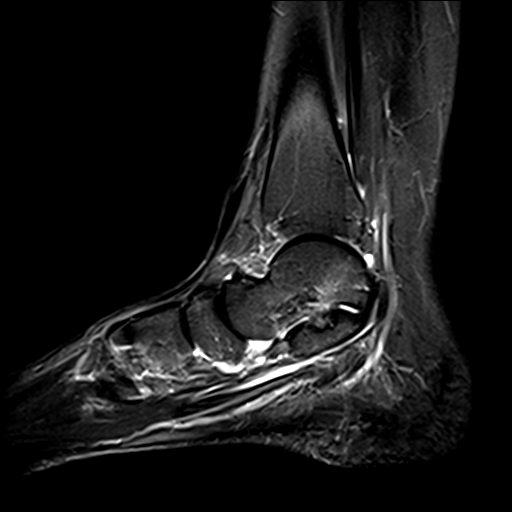
[im 18/18]
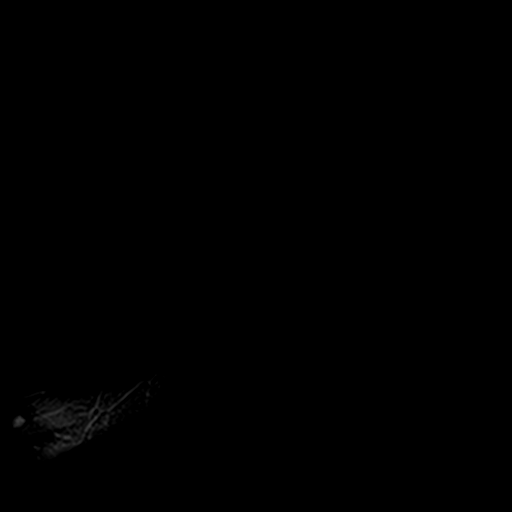

[Series 9: T2 fat-sat · coronal · 3.0mm · 0.50mm/px · 8 of 36 slices shown (2 of 2)]
[im 1/36]
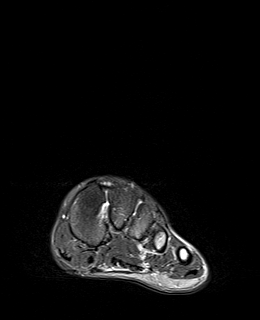
[im 6/36]
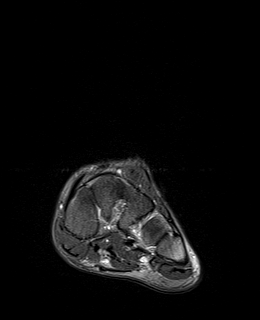
[im 11/36]
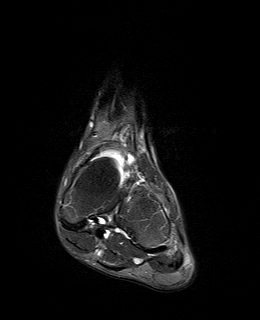
[im 16/36]
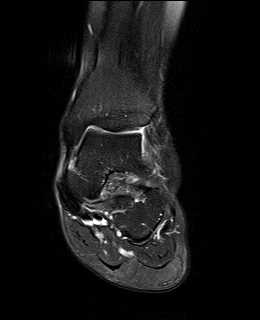
[im 21/36]
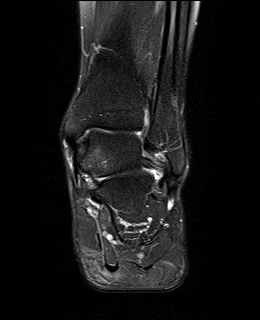
[im 26/36]
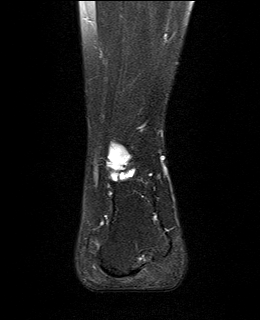
[im 31/36]
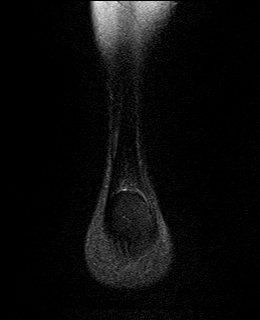
[im 36/36]
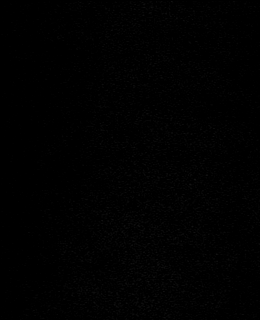

[Series 10: STIR · coronal · 3.0mm · 0.31mm/px · 3 of 36 slices shown (2 of 2)]
[im 1/36]
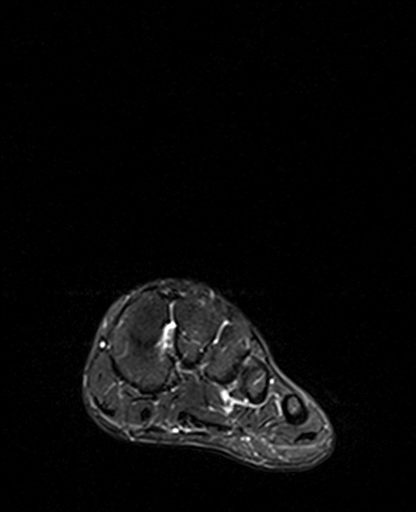
[im 6/36]
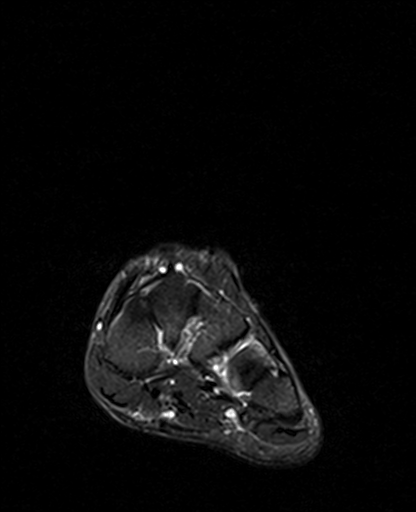
[im 11/36]
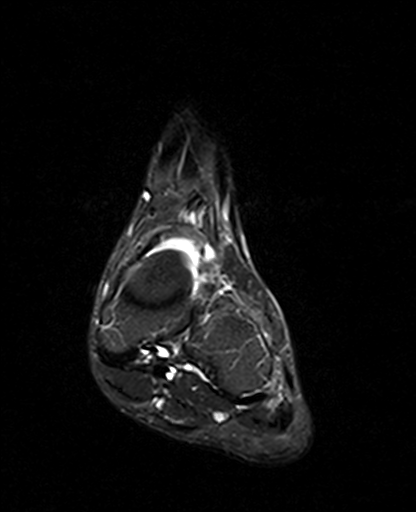

[35 of 40 positions shown; findings below may reference images not displayed]

FINDINGS: TENDONS

Peroneal: Peroneal longus tendon intact. Peroneal brevis intact.

Posteromedial: Posterior tibial tendon intact. Flexor hallucis
longus tendon intact. Flexor digitorum longus tendon intact.

Anterior: Tibialis anterior tendon intact. Extensor hallucis longus
tendon intact Extensor digitorum longus tendon intact.

Achilles:  Intact.

Plantar Fascia: Intact.

LIGAMENTS

Lateral: Severe thickening of the anterior talofibular ligament most
consistent with severe ligament strain without complete disruption.
Calcaneofibular ligament intact. Posterior talofibular ligament
intact. Anterior and posterior tibiofibular ligaments intact.

Medial: Deltoid ligament intact. Spring ligament intact.

Lisfranc: Intact.

Collaterals: Intact.

CARTILAGE

Ankle Joint: No joint effusion. Normal ankle mortise. No chondral
defect.

Subtalar Joints/Sinus Tarsi: Normal subtalar joints. No subtalar
joint effusion. Normal sinus tarsi.

Bones: Mild marrow edema in the medial hallux sesamoid. No acute
fracture or dislocation. No aggressive osseous lesion. Mild
osteoarthritis of the talonavicular joint. Mild subcortical marrow
edema in the medial talus.

Soft Tissue: No fluid collection or hematoma. Muscles are normal.
IMPRESSION: 1. Severe thickening of the anterior talofibular ligament most
consistent with severe ligament strain without complete disruption.
2. Mild marrow edema in the medial hallux sesamoid which may reflect
osseous contusion reactive changes.

## 2022-01-14 ENCOUNTER — Other Ambulatory Visit: Payer: Self-pay

## 2022-01-14 ENCOUNTER — Ambulatory Visit
Admission: RE | Admit: 2022-01-14 | Discharge: 2022-01-14 | Disposition: A | Discharge status: Home / Self Care | Payer: Self-pay | Source: Ambulatory Visit | Attending: Obstetrics and Gynecology | Admitting: Obstetrics and Gynecology

## 2022-01-14 ENCOUNTER — Ambulatory Visit
Admission: RE | Admit: 2022-01-14 | Discharge: 2022-01-14 | Disposition: A | Discharge status: Home / Self Care | Payer: Managed Care, Other (non HMO) | Source: Ambulatory Visit | Attending: Obstetrics and Gynecology | Admitting: Obstetrics and Gynecology

## 2022-01-14 DIAGNOSIS — N6321 Unspecified lump in the left breast, upper outer quadrant: Secondary | ICD-10-CM

## 2022-01-21 ENCOUNTER — Ambulatory Visit: Payer: 59 | Admitting: Adult Health

## 2022-01-26 ENCOUNTER — Encounter: Payer: Self-pay | Admitting: Adult Health

## 2022-01-26 ENCOUNTER — Ambulatory Visit (INDEPENDENT_AMBULATORY_CARE_PROVIDER_SITE_OTHER): Payer: Managed Care, Other (non HMO) | Admitting: Adult Health

## 2022-01-26 ENCOUNTER — Other Ambulatory Visit: Payer: Self-pay

## 2022-01-26 VITALS — BP 102/60 | HR 66 | Temp 98.2°F | Ht 66.0 in | Wt 123.0 lb

## 2022-01-26 DIAGNOSIS — R053 Chronic cough: Secondary | ICD-10-CM | POA: Diagnosis not present

## 2022-01-26 MED ORDER — BENZONATATE 200 MG PO CAPS: 200.0000 mg | ORAL_CAPSULE | Freq: Three times a day (TID) | ORAL | 5 refills | Status: DC | PRN

## 2022-01-26 NOTE — Patient Instructions (Addendum)
HRCT Chest .  ?Referral to allergy .  ?Continue Allegra '180mg'$  in am  ?Continue on Chlorpheniramine '4mg'$  At bedtime As needed  drainage  (over the counter )  ?Deslym 2 tsp Twice daily  for cough As needed  (over the counter)  ?Tessalon Three times a day  for cough as needed  ?Pepcid '20mg'$  At bedtime  .  ?Albuterol Inhaler 1-2 puffs every 6hr as needed for wheezing /shortness of breath .  ?Once your cough is resolved can start to decrease above meds and change to as needed only .  ?Follow up with Dr. Loanne Drilling or Fleet Higham NP in 6 month and As needed  ?Please contact office for sooner follow up if symptoms do not improve or worsen or seek emergency care  ? ?

## 2022-01-26 NOTE — Progress Notes (Signed)
? ?_0  ID: [name], female    DOB: 1971-02-14, 51 y.o.   MRN: [id number] ? ?Chief Complaint  ?Patient presents with  ? Follow-up  ? ? ?Referring provider: ?Mosie Lukes, MD ? ?HPI: ?51 year old female never smoker seen for pulmonary consult May 19, 2021 for chronic cough x1 year ?Patient has a TV anchor, body language expert ? ?TEST/EVENTS :  ?Chest x-ray January 05, 2021 showed clear lungs.   ?  ?01/05/21 Pulmonary function testing that showed normal lung function with no airflow obstruction or restriction and a normal diffusing capacity.  FEV1 122%, ratio 90, FVC 107%.,  No significant bronchodilator response, DLCO 112%. ?  ?Cough workup :  ? No known occupational exposures.  Does travel domestically.  Denies basement or hot tub.  Does not smoke.  No birds or chickens. ?  ?Allergen panel July 22, 2021 negative, IgE 17, absolute eosinophils 0, BNP 7 ? ?01/26/2022 Follow up ; Chronic cough  ?Patient presents for 63-monthfollow-up.  Patient has been followed for chronic cough over the last 1.5 years.  Work-up has been unrevealing with normal pulmonary function testing.  Allergy panel was negative.  IgE was low at 17.  Absolute eosinophil count was 0.  BNP was normal.  ENT evaluation with laryngoscopy showed normal vocal cords and mobility.  Mild edema noted.  Patient was treated for an upper airway cough syndrome with traditional triggers of postnasal drainage and reflux.  She was recommended to take Allegra, chlor tabs, Pepcid, Delsym and Tessalon for cough control.  ?Last visit patient was referred to allergy. She was not able to set up appointment . Would like referral to be sent again.  Since last visit patient says with doing very well.  Her cough totally resolved over the last 3 months.  She noticed about 2 weeks ago that she is getting an intermittent dry cough that comes up sporadically.  She cannot identify any specific triggers for her cough.  But is worried that it will return like  before.  She denies any fever, discolored mucus, wheezing.  Patient has remained on Allegra daily Chlortab 4 mg at bedtime.  And Pepcid at bedtime.  Says that she just Tessalon.  But is also concerned that her insurance is not going to cover Tessalon.  She stopped taking Delsym once her cough resolved. ?Rare use of macrodantin. On meds for hair loss.  ?Patient remains very active. ?She works full-time.  Has 2 children.  1 child is at [employer]  Her the child is a sEquities traderin high school.  And is currently applying to colleges. ? ? ?No Known Allergies ? ?Immunization History  ?Administered Date(s) Administered  ? Hepatitis A 10/21/1998, 03/07/2010  ? Hepatitis B 07/22/1998, 09/08/1998, 01/19/1999  ? Influenza Inj Mdck Quad Pf 07/29/2019  ? Influenza Split 07/31/2011  ? Influenza,inj,Quad PF,6+ Mos 07/20/2013, 07/05/2014, 07/16/2015, 06/24/2016, 07/25/2017, 09/08/2018, 08/17/2020, 07/22/2021  ? Influenza-Unspecified 03/31/2010  ? MMR 03/14/2018  ? PFIZER(Purple Top)SARS-COV-2 Vaccination 02/03/2020, 03/02/2020, 10/02/2020  ? Td 07/06/2005  ? Tdap 07/06/2005, 09/07/2013  ? Typhoid Inactivated 03/31/2010  ? ? ?Past Medical History:  ?Diagnosis Date  ? Allergy   ? Alopecia   ? Anxiety and depression 07/31/2011  ? Chest pain 09/22/10  ? Sharp pains in left breast & sometimes in the right breast as well. Random in occurence & nonexertional. Also has occasional SOB & DOE when going up stairs & occasionally when working out. Nuclear Stress Treadmill 09/29/10 -   ? 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 IBS (irritable bowel syndrome)   ? Interstitial cystitis   ? Multiple allergies 07/02/2011  ? Neuromuscular disorder ([place])   ? past hx dx of Fibromyalgia  ? Otitis media 03/16/2012  ? Perimenopausal 07/22/2013  ? PONV (postoperative nausea and vomiting)   ? Poor concentration 07/02/2011  ? Shoulder pain, left 04/11/2012  ? SOB (shortness of breath) 09/22/10  ? ECHO 09/29/10 - NL LVF, trivial PE, trivial TR/PR.  ? UTI (lower  urinary tract infection) 08/28/2011  ? ? ?Tobacco History: ?Social History  ? ?Tobacco Use  ?Smoking Status Never  ?Smokeless Tobacco Never  ? ?Counseling given: Not Answered ? ? ?Outpatient Medications Prior to Visit  ?Medication Sig Dispense Refill  ? albuterol (VENTOLIN HFA) 108 (90 Base) MCG/ACT inhaler Inhale 1-2 puffs into the lungs every 6 (six) hours as needed for wheezing or shortness of breath. 8 g 1  ? amphetamine-dextroamphetamine (ADDERALL) 20 MG tablet Take 1 tablet (20 mg total) by mouth 2 (two) times daily. 60 tablet 0  ? estradiol (ESTRACE) 0.5 MG tablet Take 2 tablets (1 mg total) by mouth daily. (Patient taking differently: Take 0.5 mg by mouth daily. Take one tablet (0.5 mg) by mouth daily.) 180 tablet 4  ? famotidine (PEPCID) 40 MG tablet TAKE 1 TABLET BY MOUTH EVERYDAY AT BEDTIME 90 tablet 1  ? fexofenadine (ALLEGRA) 180 MG tablet TAKE 1 TABLET BY MOUTH EVERY DAY 90 tablet 1  ? finasteride (PROSCAR) 5 MG tablet TAKE 1 TABLET BY MOUTH EVERY DAY 90 tablet 1  ? flunisolide (NASALIDE) 25 MCG/ACT (0.025%) SOLN Place 2 sprays into the nose 2 (two) times daily as needed. USE AS DIRECTED 75 mL 3  ? hydrOXYzine (ATARAX/VISTARIL) 10 MG tablet Take 1-2 tablets (10-20 mg total) by mouth every 8 (eight) hours as needed (pain, insomnia). 90 tablet 1  ? meloxicam (MOBIC) 15 MG tablet Take 15 mg by mouth daily as needed.    ? Methen-Hyosc-Meth Blue-Na Phos (ME/NAPHOS/MB/HYO1) 81.6 MG TABS Take 1 tablet by mouth every 6 (six) hours as needed (pain/buring). 60 tablet 2  ? nitrofurantoin (MACRODANTIN) 50 MG capsule Take by mouth.    ? progesterone (PROMETRIUM) 100 MG capsule Take 1 capsule (100 mg total) by mouth at bedtime. 90 capsule 4  ? Spacer/Aero-Holding Chambers (AEROCHAMBER MINI CHAMBER) DEVI Use as needed with albuterol HFA 1 each 0  ? spironolactone (ALDACTONE) 100 MG tablet Take 100 mg by mouth daily.    ? traMADol (ULTRAM) 50 MG tablet Take by mouth.    ? tretinoin (RETIN-A) 0.05 % cream Apply  topically.    ? UNABLE TO FIND cocovia    ? valACYclovir (VALTREX) 500 MG tablet Take 1 tablet (500 mg total) by mouth daily. 90 tablet 3  ? VALIUM 10 MG tablet 10 mg at bedtime.    ? XIIDRA 5 % SOLN     ? benzonatate (TESSALON) 200 MG capsule Take 1 capsule (200 mg total) by mouth 3 (three) times daily as needed for cough. 30 capsule 1  ? ?No facility-administered medications prior to visit.  ? ? ? ?Review of Systems:  ? ?Constitutional:   No  weight loss, night sweats,  Fevers, chills, fatigue, or  lassitude. ? ?HEENT:   No headaches,  Difficulty swallowing,  Tooth/dental problems, or  Sore throat,  ?              No sneezing, itching, ear ache, mild -nasal congestion, post nasal drip,  ? ?  CV:  No chest pain,  Orthopnea, PND, swelling in lower extremities, anasarca, dizziness, palpitations, syncope.  ? ?GI  No heartburn, indigestion, abdominal pain, nausea, vomiting, diarrhea, change in bowel habits, loss of appetite, bloody stools.  ? ?Resp: No shortness of breath with exertion or at rest.  No excess mucus, no productive cough,   No coughing up of blood.  No change in color of mucus.  No wheezing.  No chest wall deformity ? ?Skin: no rash or lesions. ? ?GU: no dysuria, change in color of urine, no urgency or frequency.  No flank pain, no hematuria  ? ?MS:  No joint pain or swelling.  No decreased range of motion.  No back pain. ? ? ? ?Physical Exam ? ?BP 102/60 (BP Location: Left Arm, Cuff Size: Normal)   Pulse 66   Temp 98.2 ?F (36.8 ?C) (Temporal)   Ht _0  (1.676 m)   Wt 123 lb (55.8 kg)   LMP 07/03/2015   SpO2 98%   BMI 19.85 kg/m?  ? ?GEN: A/Ox3; pleasant , NAD, well nourished  ?  ?HEENT:  [place]/AT,  NOSE-clear, THROAT-clear, no lesions, no postnasal drip or exudate noted.  ? ?NECK:  Supple w/ fair ROM; no JVD; normal carotid impulses w/o bruits; no thyromegaly or nodules palpated; no lymphadenopathy.   ? ?RESP  Clear  P & A; w/o, wheezes/ rales/ or rhonchi. no accessory muscle use, no dullness to  percussion ? ?CARD:  RRR, no m/r/g, no peripheral edema, pulses intact, no cyanosis or clubbing. ? ?GI:   Soft & nt; nml bowel sounds; no organomegaly or masses detected.  ? ?[place]: Warm bil, no deformities or joint swe

## 2022-01-26 NOTE — Assessment & Plan Note (Signed)
Upper airway cough syndrome work-up has been essentially unrevealing.  Cough improved substantially with treatment aimed at cough suppression, trigger prevention with rhinitis and GERD. ?Patient's cough has come back over the last 2 weeks and she is concerned that it has been present for more than a year now.  She is requesting that we proceed forward with a CT scan.  Patient also wants to be referred to allergy to investigate this possibility as well. ?We will continue with cough control regimen.  Of asked her to restart Delsym.  And continue with her aggressive maintenance regimen. ?Set up for high-resolution CT chest.  Refer to allergist ? ?Plan  ?Patient Instructions  ?HRCT Chest .  ?Referral to allergy .  ?Continue Allegra '180mg'$  in am  ?Continue on Chlorpheniramine '4mg'$  At bedtime As needed  drainage  (over the counter )  ?Deslym 2 tsp Twice daily  for cough As needed  (over the counter)  ?Tessalon Three times a day  for cough as needed  ?Pepcid '20mg'$  At bedtime  .  ?Albuterol Inhaler 1-2 puffs every 6hr as needed for wheezing /shortness of breath .  ?Once your cough is resolved can start to decrease above meds and change to as needed only .  ?Follow up with Dr. Loanne Drilling or Vince Ainsley NP in 6 month and As needed  ?Please contact office for sooner follow up if symptoms do not improve or worsen or seek emergency care  ? ?  ? ?

## 2022-02-19 ENCOUNTER — Ambulatory Visit
Admission: RE | Admit: 2022-02-19 | Discharge: 2022-02-19 | Disposition: A | Discharge status: Home / Self Care | Payer: Managed Care, Other (non HMO) | Source: Ambulatory Visit | Attending: Adult Health | Admitting: Adult Health

## 2022-02-19 ENCOUNTER — Other Ambulatory Visit: Payer: Self-pay | Admitting: *Deleted

## 2022-02-19 DIAGNOSIS — R918 Other nonspecific abnormal finding of lung field: Secondary | ICD-10-CM

## 2022-02-19 DIAGNOSIS — R053 Chronic cough: Secondary | ICD-10-CM

## 2022-02-19 NOTE — Progress Notes (Signed)
CT w/o contrast ordered for 1 year.  Nothing further needed.

## 2022-03-15 ENCOUNTER — Other Ambulatory Visit: Payer: Self-pay | Admitting: Obstetrics and Gynecology

## 2022-03-15 DIAGNOSIS — Z1231 Encounter for screening mammogram for malignant neoplasm of breast: Secondary | ICD-10-CM

## 2022-03-17 ENCOUNTER — Other Ambulatory Visit: Payer: Self-pay | Admitting: Family Medicine

## 2022-03-18 ENCOUNTER — Encounter: Payer: Self-pay | Admitting: Obstetrics and Gynecology

## 2022-03-18 ENCOUNTER — Ambulatory Visit (INDEPENDENT_AMBULATORY_CARE_PROVIDER_SITE_OTHER): Payer: Commercial Managed Care - HMO | Admitting: Obstetrics and Gynecology

## 2022-03-18 ENCOUNTER — Other Ambulatory Visit (HOSPITAL_COMMUNITY)
Admission: RE | Admit: 2022-03-18 | Discharge: 2022-03-18 | Disposition: A | Discharge status: Home / Self Care | Payer: Commercial Managed Care - HMO | Source: Ambulatory Visit | Attending: Obstetrics and Gynecology | Admitting: Obstetrics and Gynecology

## 2022-03-18 VITALS — BP 98/60 | HR 62 | Resp 12 | Ht 64.75 in | Wt 123.0 lb

## 2022-03-18 DIAGNOSIS — Z01419 Encounter for gynecological examination (general) (routine) without abnormal findings: Secondary | ICD-10-CM | POA: Diagnosis not present

## 2022-03-18 DIAGNOSIS — Z1159 Encounter for screening for other viral diseases: Secondary | ICD-10-CM

## 2022-03-18 DIAGNOSIS — Z8742 Personal history of other diseases of the female genital tract: Secondary | ICD-10-CM | POA: Insufficient documentation

## 2022-03-18 DIAGNOSIS — R350 Frequency of micturition: Secondary | ICD-10-CM | POA: Diagnosis not present

## 2022-03-18 DIAGNOSIS — Z87440 Personal history of urinary (tract) infections: Secondary | ICD-10-CM

## 2022-03-18 DIAGNOSIS — Z124 Encounter for screening for malignant neoplasm of cervix: Secondary | ICD-10-CM | POA: Insufficient documentation

## 2022-03-18 DIAGNOSIS — N951 Menopausal and female climacteric states: Secondary | ICD-10-CM | POA: Diagnosis not present

## 2022-03-18 DIAGNOSIS — Z114 Encounter for screening for human immunodeficiency virus [HIV]: Secondary | ICD-10-CM

## 2022-03-18 DIAGNOSIS — Z113 Encounter for screening for infections with a predominantly sexual mode of transmission: Secondary | ICD-10-CM | POA: Diagnosis not present

## 2022-03-18 LAB — URINALYSIS, COMPLETE W/RFL CULTURE
Bacteria, UA: NONE SEEN /HPF
Bilirubin Urine: NEGATIVE
Casts: NONE SEEN /LPF
Crystals: NONE SEEN /HPF
Glucose, UA: NEGATIVE
Hgb urine dipstick: NEGATIVE
Hyaline Cast: NONE SEEN /LPF
Leukocyte Esterase: NEGATIVE
Nitrites, Initial: NEGATIVE
Protein, ur: NEGATIVE
RBC / HPF: NONE SEEN /HPF (ref 0–2)
Specific Gravity, Urine: 1.02 (ref 1.001–1.035)
WBC, UA: NONE SEEN /HPF (ref 0–5)
Yeast: NONE SEEN /HPF
pH: 6 (ref 5.0–8.0)

## 2022-03-18 LAB — NO CULTURE INDICATED

## 2022-03-18 MED ORDER — ESTRADIOL 10 MCG VA TABS: 1.0000 | ORAL_TABLET | VAGINAL | 3 refills | Status: AC

## 2022-03-18 MED ORDER — PROGESTERONE MICRONIZED 100 MG PO CAPS: 100.0000 mg | ORAL_CAPSULE | Freq: Every day | ORAL | 4 refills | Status: AC

## 2022-03-18 MED ORDER — NITROFURANTOIN MACROCRYSTAL 50 MG PO CAPS: ORAL_CAPSULE | ORAL | 3 refills | Status: DC

## 2022-03-18 MED ORDER — VALACYCLOVIR HCL 500 MG PO TABS: 500.0000 mg | ORAL_TABLET | Freq: Every day | ORAL | 3 refills | Status: DC

## 2022-03-18 MED ORDER — ESTRADIOL 0.5 MG PO TABS: 0.5000 mg | ORAL_TABLET | Freq: Every day | ORAL | 4 refills | Status: AC

## 2022-03-18 NOTE — Progress Notes (Signed)
51 y.o. G43P2012 Divorced Hispanic female here for annual exam.    She is doing well with her HRT.  Still has some night sweat, but in general they are under control.  Wants to continue.  States she started with perimenopause in her late 52s.  Has vaginal dryness.   Patient had dx left breast imaging for a 3 mm firm mass noted 3 cm from her nipple at the 2 - 3:00 position noted 12/22/21.   She is asking about removal of the cysts.  Both breasts can be sore. No trauma or new exercise.   Taking Valtrex daily.  No outbreaks.  Wants STD screening.   Needs abx for post coital UTI prevention.   PCP:   Penni Homans, MD   Patient's last menstrual period was 07/03/2015.           Sexually active: Yes.    The current method of family planning is tubal ligation.    Exercising: No.  The patient does not participate in regular exercise at present. Smoker:  no  Health Maintenance: Pap:  03-12-21 LSIL, HR HPV negative           12-22-17 negative, HR HPV negative           12-21-16 negative  History of abnormal Pap:  yes MMG:  01-14-22 diagnostic for breast lump, density C/BIRADS 2 benign- screening MMG scheduled for 04-22-22  Colonoscopy:  04-04-20 7 year f/u BMD:   n/a Result  n/a TDaP:  2014 Gardasil:   no HIV: 09-05-20 NR Hep C: 12-27-19 negative  Screening Labs:  Hb today: PCP, Urine today: pending   reports that she has never smoked. She has never used smokeless tobacco. She reports current alcohol use. She reports that she does not use drugs.  Past Medical History:  Diagnosis Date   Allergy    Alopecia    Anxiety and depression 07/31/2011   Chest pain 09/22/10   Sharp pains in left breast & sometimes in the right breast as well. Random in occurence & nonexertional. Also has occasional SOB & DOE when going up stairs & occasionally when working out. Nuclear Stress Treadmill 09/29/10 -    HSV-2 (herpes simplex virus 2) infection    IBS (irritable bowel syndrome)    Interstitial  cystitis    Multiple allergies 07/02/2011   Neuromuscular disorder ([place])    past hx dx of Fibromyalgia   Otitis media 03/16/2012   Perimenopausal 07/22/2013   PONV (postoperative nausea and vomiting)    Poor concentration 07/02/2011   Shoulder pain, left 04/11/2012   SOB (shortness of breath) 09/22/10   ECHO 09/29/10 - NL LVF, trivial PE, trivial TR/PR.   UTI (lower urinary tract infection) 08/28/2011    Past Surgical History:  Procedure Laterality Date   BREAST BIOPSY Right 03/12/2016   Procedure: breast exploration;  Surgeon: Jackolyn Confer, MD;  Location: [place];  Service: General;  Laterality: Right;   BREAST ENHANCEMENT SURGERY  2007   CESAREAN SECTION  2001 and 2004   x2   COLONOSCOPY  2007   LAPAROSCOPY     twice   RHINOPLASTY  2003   [facility]   TUBAL LIGATION     UPPER GASTROINTESTINAL ENDOSCOPY  2011    Current Outpatient Medications  Medication Sig Dispense Refill   albuterol (VENTOLIN HFA) 108 (90 Base) MCG/ACT inhaler Inhale 1-2 puffs into the lungs every 6 (six) hours as needed for wheezing or shortness of breath. 8 g  1   amphetamine-dextroamphetamine (ADDERALL) 20 MG tablet Take 1 tablet (20 mg total) by mouth 2 (two) times daily. 60 tablet 0   benzonatate (TESSALON) 200 MG capsule Take 1 capsule (200 mg total) by mouth 3 (three) times daily as needed for cough. 45 capsule 5   estradiol (ESTRACE) 0.5 MG tablet Take 2 tablets (1 mg total) by mouth daily. (Patient taking differently: Take 0.5 mg by mouth daily. Take one tablet (0.5 mg) by mouth daily.) 180 tablet 4   famotidine (PEPCID) 40 MG tablet TAKE 1 TABLET BY MOUTH EVERYDAY AT BEDTIME 90 tablet 0   fexofenadine (ALLEGRA) 180 MG tablet TAKE 1 TABLET BY MOUTH EVERY DAY 90 tablet 1   finasteride (PROSCAR) 5 MG tablet TAKE 1 TABLET BY MOUTH EVERY DAY 90 tablet 0   flunisolide (NASALIDE) 25 MCG/ACT (0.025%) SOLN Place 2 sprays into the nose 2 (two) times daily as needed. USE AS DIRECTED 75  mL 3   hydrOXYzine (ATARAX/VISTARIL) 10 MG tablet Take 1-2 tablets (10-20 mg total) by mouth every 8 (eight) hours as needed (pain, insomnia). 90 tablet 1   meloxicam (MOBIC) 15 MG tablet Take 15 mg by mouth daily as needed.     Methen-Hyosc-Meth Blue-Na Phos (ME/NAPHOS/MB/HYO1) 81.6 MG TABS Take 1 tablet by mouth every 6 (six) hours as needed (pain/buring). 60 tablet 2   nitrofurantoin (MACRODANTIN) 50 MG capsule Take by mouth.     progesterone (PROMETRIUM) 100 MG capsule Take 1 capsule (100 mg total) by mouth at bedtime. 90 capsule 4   Spacer/Aero-Holding Chambers (AEROCHAMBER MINI CHAMBER) DEVI Use as needed with albuterol HFA 1 each 0   spironolactone (ALDACTONE) 100 MG tablet Take 100 mg by mouth daily.     traMADol (ULTRAM) 50 MG tablet Take by mouth.     tretinoin (RETIN-A) 0.05 % cream Apply topically.     UNABLE TO FIND cocovia     valACYclovir (VALTREX) 500 MG tablet Take 1 tablet (500 mg total) by mouth daily. 90 tablet 3   VALIUM 10 MG tablet 10 mg at bedtime.     XIIDRA 5 % SOLN      No current facility-administered medications for this visit.    Family History  Problem Relation Age of Onset   Heart disease Mother        MVP   Anxiety disorder Mother    Mitral valve prolapse Mother    Alzheimer's disease Mother    Colon polyps Mother    Hypertension Brother    Obesity Brother    Diabetes Brother        DM, obese   Allergies Daughter    Allergies Son    Kidney disease Maternal Grandmother        uremia   Breast cancer Neg Hx    Colon cancer Neg Hx    Esophageal cancer Neg Hx    Rectal cancer Neg Hx    Stomach cancer Neg Hx     Review of Systems  Genitourinary:  Positive for frequency.       Urinary retention    All other systems reviewed and are negative.  Exam:   BP 98/60 (BP Location: Left Arm, Patient Position: Sitting, Cuff Size: Normal)   Pulse 62   Resp 12   Ht 5' 4.75" (1.645 m)   Wt 123 lb (55.8 kg)   LMP 07/03/2015   BMI 20.63 kg/m      General appearance: alert, cooperative and appears stated age Head: normocephalic, without obvious  abnormality, atraumatic Neck: no adenopathy, supple, symmetrical, trachea midline and thyroid normal to inspection and palpation Lungs: clear to auscultation bilaterally Breasts: normal appearance, no masses or tenderness, No nipple retraction or dimpling, No nipple discharge or bleeding, No axillary adenopathy Heart: regular rate and rhythm Abdomen: soft, non-tender; no masses, no organomegaly Extremities: extremities normal, atraumatic, no cyanosis or edema Skin: skin color, texture, turgor normal. No rashes or lesions Lymph nodes: cervical, supraclavicular, and axillary nodes normal. Neurologic: grossly normal  Pelvic: External genitalia:  no lesions              No abnormal inguinal nodes palpated.              Urethra:  normal appearing urethra with no masses, tenderness or lesions              Bartholins and Skenes: normal                 Vagina: normal appearing vagina with normal color and discharge, no lesions              Cervix: no lesions              Pap taken: yes Bimanual Exam:  Uterus:  normal size, contour, position, consistency, mobility, non-tender              Adnexa: no mass, fullness, tenderness              Rectal exam: yes.  Confirms.              Anus:  normal sphincter tone, no lesions  Chaperone was present for exam:  Maudie Mercury, CMA  Assessment:   Well woman visit with gynecologic exam. LGSIL pap.  Bilateral breast implants.  Recent left breast cysts.  HRT. Vaginal atrophy symptoms.  HSV 2.  Interstitial cystitis.  Urinary frequency.  Post coital UTI. STD screening.   Plan: Mammogram screening discussed.  She has upcoming appointment.  Self breast awareness reviewed. Pap and HR HPV collected.  Guidelines for Calcium, Vitamin D, regular exercise program including cardiovascular and weight bearing exercise. Discused WHI and use of HRT which can increase  risk of PE, DVT, MI, stroke and breast cancer.  Refill of Prometrium 100 mg and estrace 0.5 mg for one year.  Rx for Vagifem.  Instructed in use.  Rx for Nitrofurantoin 50 g po with intercourse for prophylaxis.  Rx for Valtrex 500 mg daily for HSV prevention and bid x 3 days for HSV infection.  BMD at this office.  STD screening today:  GC/CT/trich/HIV/RPR/Hep C aby.  Urinalysis:  g 1.020, pH 6.0, 0 - 5 squams, everything else negative.  No UC sent.  Follow up annually and prn.   After visit summary provided.

## 2022-03-18 NOTE — Patient Instructions (Signed)

## 2022-03-19 LAB — HIV ANTIBODY (ROUTINE TESTING W REFLEX): HIV 1&2 Ab, 4th Generation: NONREACTIVE

## 2022-03-19 LAB — HEPATITIS C ANTIBODY
Hepatitis C Ab: NONREACTIVE
SIGNAL TO CUT-OFF: 0.04 (ref <1.00)

## 2022-03-19 LAB — RPR: RPR Ser Ql: NONREACTIVE

## 2022-03-23 LAB — CYTOLOGY - PAP
Adequacy: ABSENT
Chlamydia: NEGATIVE
Comment: NEGATIVE
Comment: NEGATIVE
Comment: NEGATIVE
Comment: NORMAL
Diagnosis: UNDETERMINED — AB
High risk HPV: NEGATIVE
Neisseria Gonorrhea: NEGATIVE
Trichomonas: NEGATIVE

## 2022-04-05 ENCOUNTER — Ambulatory Visit: Payer: 59 | Admitting: Internal Medicine

## 2022-04-05 NOTE — Progress Notes (Deleted)
Name: [name]  MRN/ DOB: [id number], 01-Jul-1971    Age/ Sex: 51 y.o., female    PCP: Mosie Lukes, MD   Reason for Endocrinology Evaluation: MNG     Date of Initial Endocrinology Evaluation: 05/21/2021    HPI: [name] is a 51 y.o. female with a past medical history of ADHD, depression and anxiety. The patient presented for initial endocrinology clinic visit on 05/21/2021 for consultative assistance with her MNG.     She was noted to have multinodular goiter on thyroid ultrasound dated 04/13/2021, she was having cough at the time.  2 of the nodules have met criteria for FNA, which were performed on 04/16/2021  Right inferior nodule 2.6 cm cytology report consistent with scant cellularity with repeat in 07/2021 showing benign cytology  Left inferior nodule FNA 2.7 cm, showed benign cytology 04/2021  No prior exposure to radiation    No Fh of thyroid disease     SUBJECTIVE:    Today (04/05/22):  [name] is here for a follow up on MNG.      HISTORY:  Past Medical History:  Past Medical History:  Diagnosis Date   Allergy    Alopecia    Anxiety and depression 07/31/2011   Chest pain 09/22/10   Sharp pains in left breast & sometimes in the right breast as well. Random in occurence & nonexertional. Also has occasional SOB & DOE when going up stairs & occasionally when working out. Nuclear Stress Treadmill 09/29/10 -    HSV-2 (herpes simplex virus 2) infection    IBS (irritable bowel syndrome)    Interstitial cystitis    Multiple allergies 07/02/2011   Neuromuscular disorder ([place])    past hx dx of Fibromyalgia   Otitis media 03/16/2012   Perimenopausal 07/22/2013   PONV (postoperative nausea and vomiting)    Poor concentration 07/02/2011   Shoulder pain, left 04/11/2012   SOB (shortness of breath) 09/22/10   ECHO 09/29/10 - NL LVF, trivial PE, trivial TR/PR.   UTI (lower urinary tract infection) 08/28/2011   Past  Surgical History:  Past Surgical History:  Procedure Laterality Date   BREAST BIOPSY Right 03/12/2016   Procedure: breast exploration;  Surgeon: Jackolyn Confer, MD;  Location: [facility];  Service: General;  Laterality: Right;   BREAST ENHANCEMENT SURGERY  2007   CESAREAN SECTION  2001 and 2004   x2   COLONOSCOPY  2007   LAPAROSCOPY     twice   RHINOPLASTY  2003   [place]   TUBAL LIGATION     UPPER GASTROINTESTINAL ENDOSCOPY  2011    Social History:  reports that she has never smoked. She has never used smokeless tobacco. She reports current alcohol use. She reports that she does not use drugs. Family History: family history includes Allergies in her daughter and son; Alzheimer's disease in her mother; Anxiety disorder in her mother; Colon polyps in her mother; Diabetes in her brother; Heart disease in her mother; Hypertension in her brother; Kidney disease in her maternal grandmother; Mitral valve prolapse in her mother; Obesity in her brother.   HOME MEDICATIONS: Allergies as of 04/05/2022   No Known Allergies      Medication List        Accurate as of April 05, 2022  7:26 AM. If you have any questions, ask your nurse or doctor.          [employer] as  needed with albuterol HFA   albuterol 108 (90 Base) MCG/ACT inhaler Commonly known as: VENTOLIN HFA Inhale 1-2 puffs into the lungs every 6 (six) hours as needed for wheezing or shortness of breath.   amphetamine-dextroamphetamine 20 MG tablet Commonly known as: ADDERALL Take 1 tablet (20 mg total) by mouth 2 (two) times daily.   benzonatate 200 MG capsule Commonly known as: TESSALON Take 1 capsule (200 mg total) by mouth 3 (three) times daily as needed for cough.   estradiol 0.5 MG tablet Commonly known as: ESTRACE Take 1 tablet (0.5 mg total) by mouth daily. Take one tablet (0.5 mg) by mouth daily.   Estradiol 10 MCG Tabs vaginal tablet Commonly known as:  Vagifem Place 1 tablet (10 mcg total) vaginally 2 (two) times a week. Use every night before bed for two weeks when you first begin this medicine, then after the first two weeks, begin using it twice a week.   famotidine 40 MG tablet Commonly known as: PEPCID TAKE 1 TABLET BY MOUTH EVERYDAY AT BEDTIME   fexofenadine 180 MG tablet Commonly known as: ALLEGRA TAKE 1 TABLET BY MOUTH EVERY DAY   finasteride 5 MG tablet Commonly known as: PROSCAR TAKE 1 TABLET BY MOUTH EVERY DAY   flunisolide 25 MCG/ACT (0.025%) Soln Commonly known as: NASALIDE Place 2 sprays into the nose 2 (two) times daily as needed. USE AS DIRECTED   hydrOXYzine 10 MG tablet Commonly known as: ATARAX Take 1-2 tablets (10-20 mg total) by mouth every 8 (eight) hours as needed (pain, insomnia).   ME/NaPhos/MB/Hyo1 81.6 MG Tabs Take 1 tablet by mouth every 6 (six) hours as needed (pain/buring).   meloxicam 15 MG tablet Commonly known as: MOBIC Take 15 mg by mouth daily as needed.   nitrofurantoin 50 MG capsule Commonly known as: MACRODANTIN Take one tablet (50 mg) by mouth once as needed for prevention of urinary tract infection.   progesterone 100 MG capsule Commonly known as: PROMETRIUM Take 1 capsule (100 mg total) by mouth at bedtime.   spironolactone 100 MG tablet Commonly known as: ALDACTONE Take 100 mg by mouth daily.   traMADol 50 MG tablet Commonly known as: ULTRAM Take by mouth.   tretinoin 0.05 % cream Commonly known as: RETIN-A Apply topically.   UNABLE TO FIND cocovia   valACYclovir 500 MG tablet Commonly known as: VALTREX Take 1 tablet (500 mg total) by mouth daily. Take daily for prevention.  Take 1 tablet by mouth twice daily for 3 days for an outbreak.   Valium 10 MG tablet Generic drug: diazepam 10 mg at bedtime.   Xiidra 5 % Soln Generic drug: Lifitegrast          REVIEW OF SYSTEMS: A comprehensive ROS was conducted with the patient and is negative except as per HPI      OBJECTIVE:  VS: LMP 07/03/2015    Wt Readings from Last 3 Encounters:  03/18/22 123 lb (55.8 kg)  01/26/22 123 lb (55.8 kg)  12/22/21 129 lb (58.5 kg)     EXAM: General: Pt appears well and is in NAD  Neck: General: Supple without adenopathy. Thyroid: Thyroid size normal.  Left thyroid nodule appreciated, and able to palpate the right on today's exam   Lungs: Clear with good BS bilat with no rales, rhonchi, or wheezes  Heart: Auscultation: RRR.  Abdomen: Normoactive bowel sounds, soft, nontender, without masses or organomegaly palpable  Extremities:  BL LE: No pretibial edema normal ROM and strength.  Skin: Hair: Texture  and amount normal with gender appropriate distribution Skin Inspection: No rashes Skin Palpation: Skin temperature, texture, and thickness normal to palpation  Neuro: Cranial nerves: II - XII grossly intact  DTRs: 2+ and symmetric in UE without delay in relaxation phase  Mental Status: Judgment, insight: Intact Orientation: Oriented to time, place, and person Mood and affect: No depression, anxiety, or agitation     DATA REVIEWED:   Results for XOCHILT, CONANT (MRN [id number]) as of 05/21/2021 14:12  Ref. Range 04/10/2021 14:10  TSH Latest Ref Range: 0.35 - 4.50 uIU/mL 1.33  T4,Free(Direct) Latest Ref Range: 0.60 - 1.60 ng/dL 0.87    Thyroid ultrasound 04/13/2021  Estimated total number of nodules >/= 1 cm: 3   Number of spongiform nodules >/=  2 cm not described below (TR1): 0   Number of mixed cystic and solid nodules >/= 1.5 cm not described below ([place]): 0   _________________________________________________________   Nodule # 1:   Location: Right; Inferior   Maximum size: 2.6 cm; Other 2 dimensions: 1.7 cm x 2.0 cm   Composition: solid/almost completely solid (2)   Echogenicity: hypoechoic (2)   Shape: not taller-than-wide (0)   Margins: ill-defined (0)   Echogenic foci: none (0)   ACR TI-RADS total points:  4.   ACR TI-RADS risk category: TR4 (4-6 points).   ACR TI-RADS recommendations:   Nodule meets criteria for biopsy   _________________________________________________________   Nodule # 2:   Location: Right; Mid   Maximum size: 1.3 cm; Other 2 dimensions: 0.8 cm x 1.2 cm   Composition: mixed cystic and solid (1)   Echogenicity: hypoechoic (2)   Shape: not taller-than-wide (0)   Margins: ill-defined (0)   Echogenic foci: none (0)   ACR TI-RADS total points: 3.   ACR TI-RADS risk category: TR1 (0-1 points).   ACR TI-RADS recommendations:   Nodule does not meet criteria for surveillance or biopsy   _________________________________________________________   Nodule # 3:   Location: Left; Inferior   Maximum size: 2.7 cm; Other 2 dimensions: 1.4 cm x 1.6 cm   Composition: solid/almost completely solid (2)   Echogenicity: isoechoic (1)   Shape: not taller-than-wide (0)   Margins: ill-defined (0)   Echogenic foci: none (0)   ACR TI-RADS total points: 3.   ACR TI-RADS risk category: TR3 (3 points).   ACR TI-RADS recommendations:   Nodule meets criteria for biopsy   _________________________________________________________   Nodule # 4:   Location: Left; Inferior   Maximum size: 0.8 cm; Other 2 dimensions: 0.7 cm x 0.7 cm   Composition: cannot determine (2)   Echogenicity: hypoechoic (2)   Shape: not taller-than-wide (0)   Margins: ill-defined (0)   Echogenic foci: none (0)   ACR TI-RADS total points: 4.   ACR TI-RADS risk category: TR4 (4-6 points).   ACR TI-RADS recommendations:   Nodule does not meet criteria for surveillance or biopsy   _________________________________________________________   No adenopathy   IMPRESSION: Multinodular thyroid.   Right inferior thyroid nodule (labeled 1, 2.6 cm, TR 4), and the left inferior thyroid nodule (labeled 3, 2.7 cm, TR 3) both meet criteria for biopsy, as designated by the newly  established ACR TI-RADS criteria, and referral for biopsy is recommended.    Right inferior nodule FNA 04/16/2021  Clinical History: Nodule #1 right inferior 2.6 x 1.7 x 2.0 cm  Specimen Submitted:  A. THYROID, RIGHT INFERIOR, FINE NEEDLE ASPIRATION:    FINAL MICROSCOPIC DIAGNOSIS:  - Scant follicular  epithelium present (Bethesda category I)   Repeat FNA right inferior 07/21/2021 FINAL MICROSCOPIC DIAGNOSIS:  - Consistent with benign follicular nodule (Bethesda category II)    Left inferior nodule FNA 04/16/2021  Clinical History: Nodule #3 left inferior 2.7 x 1.4 x 1.6 cm  Specimen Submitted:  A. THYROID, LEFT INFERIOR, FINE NEEDLE ASPIRATION:    FINAL MICROSCOPIC DIAGNOSIS:  - Consistent with benign follicular nodule (Bethesda category II)   ASSESSMENT/PLAN/RECOMMENDATIONS:   Multinodular goiter  -Patient is biochemically euthyroid -We reviewed her thyroid ultrasound today, we discussed the risk of malignancy is 3 to 5% in each of the thyroid nodules. -She is status post benign FNA of the left thyroid nodule -She is also status post FNA of the right thyroid nodule with scant cellularity Bethesda category I -I have recommended proceeding with another FNA of the right thyroid nodule in September 2022, she is in agreement that this -Orders have been placed -We discussed that the natural history of multinodular goiter is continued growth, she also understands that if FNA comes back benign she will require annual checkups and follow-ups on her thyroid Follow-up in 1 year  Signed electronically by: Mack Guise, MD  [facility]  [facility] [address] [place], [place] [address] Phone: [phone] FAX: [phone]   CC: Mosie Lukes, MD [address] [place] [address] Phone: [phone] Fax: [phone]   Return to Endocrinology clinic as below: Future Appointments  Date Time Provider  [place]  04/05/2022  8:30 AM Durinda Buzzelli, Melanie Crazier, MD [facility] None  04/07/2022  9:30 AM Sigurd Sos, MD [facility] None  04/07/2022  2:00 PM [facility] DEXA RM 1 [facility] None  04/22/2022 10:10 AM [facility] MM 2 [facility]  07/15/2022 11:00 AM Parrett, Fonnie Mu, NP [facility] None

## 2022-04-07 ENCOUNTER — Ambulatory Visit: Payer: Managed Care, Other (non HMO) | Admitting: Internal Medicine

## 2022-04-15 ENCOUNTER — Encounter: Payer: Self-pay | Admitting: Internal Medicine

## 2022-04-19 NOTE — Progress Notes (Unsigned)
NEW PATIENT Date of Service/Encounter:  04/21/22 Referring provider: Melvenia Needles, NP Primary care provider: Mosie Lukes, MD  Subjective:  [name] is a 51 y.o. female with a PMHx of interstitial cystitis, IBS, GERD, vitamin D deficiency, anxiety and depression, fatigue, hyperlipidemia, alopecia presenting today for evaluation of chronic cough. History obtained from: chart review and patient.  Chronic cough: Cough started 2 years ago and is dry.  Was occurring daily, and often at night which would wake her up.  Seems to be under control currently; every once in a while will have a breakthrough cough.  She believes that her cough is doing well with current regimen-current regimen includes allegra daily, chlortabs, pepcid every day. Not taking delsym. Using tessalon pearls as needed. Flunisolide 2 sprays each nostril daily as needed.  Currently stopped all medications since Saturday for allergy testing.  She has a rescue inhaler that she has never used.  This was prescribed for her cough. She was evaluated by ENT and was told she had nodules which were biopsied.  She does not plan for follow-up with ENT.  She has planned follow-up with pulmonary in September.   Chronic rhinitis:  She has been allergy tested in the remote past (around 20 years ago at [facility]) and remembers being positive to molds and dust mites. She is no longer having drainage down her throat, but was a few months ago.  Also reports rhinorrhea, but both of these symptoms improved since starting chlor tabs.   She remembers getting afrin at the ENT office and said it made a world of difference in her breathing.  She does not consider herself an allergic person.    Referred by [facility] 01/26/22: "01/05/21 Pulmonary function testing that showed normal lung function with no airflow obstruction or restriction and a normal diffusing capacity.  FEV1 122%, ratio 90, FVC 107%.,  No significant bronchodilator response,  DLCO 112%." "Allergen panel July 22, 2021 negative, IgE 17, absolute eosinophils 0, BNP 7" "ENT evaluation with laryngoscopy showed normal vocal cords and mobility.  Mild edema noted.  Patient was treated for an upper airway cough syndrome with traditional triggers of postnasal drainage and reflux.  She was recommended to take Allegra, chlor tabs, Pepcid, Delsym and Tessalon for cough control. We will continue with cough control regimen.  Of asked her to restart Delsym.  And continue with her aggressive maintenance regimen. Set up for high-resolution CT chest.  Refer to allergist"  HRCT: 02/19/22: "1. No evidence of interstitial lung disease. No findings to explain patient's cough. 2. Scattered pulmonary nodules measure 4 mm or less size. No follow-up needed if patient is low-risk (and has no known or suspected primary neoplasm). Non-contrast chest CT can be considered in 12 months if patient is high-risk."  Other allergy screening: Food allergy: no Medication allergy: no Hymenoptera allergy: no Urticaria: no Eczema:no History of recurrent infections suggestive of immunodeficency: no Vaccinations are up to date except needs second dose of the shingles.   Past Medical History: Past Medical History:  Diagnosis Date   Allergy    Alopecia    Anxiety and depression 07/31/2011   Chest pain 09/22/10   Sharp pains in left breast & sometimes in the right breast as well. Random in occurence & nonexertional. Also has occasional SOB & DOE when going up stairs & occasionally when working out. Nuclear Stress Treadmill 09/29/10 -    HSV-2 (herpes simplex virus 2) infection    IBS (irritable bowel syndrome)  Interstitial cystitis    Multiple allergies 07/02/2011   Neuromuscular disorder ([place])    past hx dx of Fibromyalgia   Otitis media 03/16/2012   Perimenopausal 07/22/2013   PONV (postoperative nausea and vomiting)    Poor concentration 07/02/2011   Shoulder pain, left 04/11/2012   SOB  (shortness of breath) 09/22/10   ECHO 09/29/10 - NL LVF, trivial PE, trivial TR/PR.   UTI (lower urinary tract infection) 08/28/2011   Medication List:  Current Outpatient Medications  Medication Sig Dispense Refill   albuterol (VENTOLIN HFA) 108 (90 Base) MCG/ACT inhaler Inhale 1-2 puffs into the lungs every 6 (six) hours as needed for wheezing or shortness of breath. 8 g 1   amphetamine-dextroamphetamine (ADDERALL) 20 MG tablet Take 1 tablet (20 mg total) by mouth 2 (two) times daily. 60 tablet 0   benzonatate (TESSALON) 200 MG capsule Take 1 capsule (200 mg total) by mouth 3 (three) times daily as needed for cough. 45 capsule 5   estradiol (ESTRACE) 0.5 MG tablet Take 1 tablet (0.5 mg total) by mouth daily. Take one tablet (0.5 mg) by mouth daily. 90 tablet 4   Estradiol (VAGIFEM) 10 MCG TABS vaginal tablet Place 1 tablet (10 mcg total) vaginally 2 (two) times a week. Use every night before bed for two weeks when you first begin this medicine, then after the first two weeks, begin using it twice a week. 34 tablet 3   famotidine (PEPCID) 40 MG tablet TAKE 1 TABLET BY MOUTH EVERYDAY AT BEDTIME 90 tablet 0   fexofenadine (ALLEGRA) 180 MG tablet TAKE 1 TABLET BY MOUTH EVERY DAY 90 tablet 1   finasteride (PROSCAR) 5 MG tablet TAKE 1 TABLET BY MOUTH EVERY DAY 90 tablet 0   flunisolide (NASALIDE) 25 MCG/ACT (0.025%) SOLN Place 2 sprays into the nose 2 (two) times daily as needed. USE AS DIRECTED 75 mL 3   hydrOXYzine (ATARAX/VISTARIL) 10 MG tablet Take 1-2 tablets (10-20 mg total) by mouth every 8 (eight) hours as needed (pain, insomnia). 90 tablet 1   meloxicam (MOBIC) 15 MG tablet Take 15 mg by mouth daily as needed.     Methen-Hyosc-Meth Blue-Na Phos (ME/NAPHOS/MB/HYO1) 81.6 MG TABS Take 1 tablet by mouth every 6 (six) hours as needed (pain/buring). 60 tablet 2   nitrofurantoin (MACRODANTIN) 50 MG capsule Take one tablet (50 mg) by mouth once as needed for prevention of urinary tract infection. 30  capsule 3   progesterone (PROMETRIUM) 100 MG capsule Take 1 capsule (100 mg total) by mouth at bedtime. 90 capsule 4   Spacer/Aero-Holding Chambers (AEROCHAMBER MINI CHAMBER) DEVI Use as needed with albuterol HFA 1 each 0   spironolactone (ALDACTONE) 100 MG tablet Take 100 mg by mouth daily.     traMADol (ULTRAM) 50 MG tablet Take by mouth.     tretinoin (RETIN-A) 0.05 % cream Apply topically.     UNABLE TO FIND cocovia     valACYclovir (VALTREX) 500 MG tablet Take 1 tablet (500 mg total) by mouth daily. Take daily for prevention.  Take 1 tablet by mouth twice daily for 3 days for an outbreak. 110 tablet 3   VALIUM 10 MG tablet 10 mg at bedtime.     XIIDRA 5 % SOLN      No current facility-administered medications for this visit.   Known Allergies:  No Known Allergies Past Surgical History: Past Surgical History:  Procedure Laterality Date   BREAST BIOPSY Right 03/12/2016   Procedure: breast exploration;  Surgeon: Jackolyn Confer,  MD;  Location: [facility];  Service: General;  Laterality: Right;   BREAST ENHANCEMENT SURGERY  2007   CESAREAN SECTION  2001 and 2004   x2   COLONOSCOPY  2007   LAPAROSCOPY     twice   RHINOPLASTY  2003   TONSILLECTOMY  1982   TUBAL LIGATION     UPPER GASTROINTESTINAL ENDOSCOPY  2011   Family History: Family History  Problem Relation Age of Onset   Heart disease Mother        MVP   Anxiety disorder Mother    Mitral valve prolapse Mother    Alzheimer's disease Mother    Colon polyps Mother    Hypertension Brother    Obesity Brother    Diabetes Brother        DM, obese   Allergies Daughter    Allergies Son    Kidney disease Maternal Grandmother        uremia   Breast cancer Neg Hx    Colon cancer Neg Hx    Esophageal cancer Neg Hx    Rectal cancer Neg Hx    Stomach cancer Neg Hx    Social History: Dinia lives in a townhouse built 20 years ago, no water damage, carpet floors, gas heating, central AC, occasional cat in  the home, using dust mite protection on the bedding and pillows, she is a TV personality/speaker, no HEPA filter in the home.  [place]   ROS:  All other systems negative except as noted per HPI.  Objective:  Blood pressure 100/62, pulse 70, temperature 98.7 F (37.1 C), resp. rate 16, height '5\' 6"'$  (1.676 m), weight 122 lb 8 oz (55.6 kg), last menstrual period 07/03/2015, SpO2 98 %. Body mass index is 19.77 kg/m. Physical Exam:  General Appearance:  Alert, cooperative, no distress, appears stated age  Head:  Normocephalic, without obvious abnormality, atraumatic  Eyes:  Conjunctiva clear, EOM's intact  Nose: Nares normal, hypertrophic turbinates, normal mucosa, and no visible anterior polyps  Throat: Lips, tongue normal; teeth and gums normal, normal posterior oropharynx  Neck: Supple, symmetrical  Lungs:   clear to auscultation bilaterally, Respirations unlabored, no coughing  Heart:  regular rate and rhythm and no murmur, Appears well perfused  Extremities: No edema  Skin: Skin color, texture, turgor normal, no rashes or lesions on visualized portions of skin  Neurologic: No gross deficits     Diagnostics: Spirometry:  Tracings reviewed. Her effort: Good reproducible efforts. FVC: 3.43L  FEV1: 2.88L, 98% predicted  FEV1/FVC ratio: 105% Interpretation: Spirometry consistent with normal pattern   Skin Testing: Environmental allergy panel.  Adequate controls. Results discussed with patient/family.  Airborne Adult Perc - 04/21/22 1300     Time Antigen Placed 1352    Allergen Manufacturer Lavella Hammock    Location Back    Number of Test 59    1. Control-Buffer 50% Glycerol Negative    2. Control-Histamine 1 mg/ml 3+    3. Albumin saline Negative    4. [place] Negative    5. [place] Negative    6. [place] Negative    7. [place] Blue Negative    8. Meadow Fescue Negative    9. Perennial Rye Negative    10. Sweet Vernal Negative    11. Timothy  Negative    12. Cocklebur Negative    13. Burweed Marshelder Negative    14. Ragweed, short Negative    15. Ragweed, Giant Negative    16. Plantain,  English Negative    17. Lamb's Quarters Negative    18. Sheep Sorrell Negative    19. Rough Pigweed Negative    20. Marsh Elder, Rough Negative    21. Mugwort, Common Negative    22. Ash mix Negative    23. Birch mix Negative    24. Beech American Negative    25. Box, Elder Negative    26. Cedar, red Negative    27. Cottonwood, [place] Negative    28. Elm mix Negative    29. Hickory Negative    30. Maple mix Negative    31. Oak, [place] mix Negative    32. Pecan Pollen Negative    33. Pine mix Negative    34. Sycamore Eastern Negative    35. [place], Black Pollen Negative    36. Alternaria alternata Negative    37. Cladosporium Herbarum Negative    38. Aspergillus mix Negative    39. Penicillium mix Negative    40. Bipolaris sorokiniana (Helminthosporium) Negative    41. Drechslera spicifera (Curvularia) Negative    42. Mucor plumbeus Negative    43. Fusarium moniliforme Negative    44. Aureobasidium pullulans (pullulara) Negative    45. Rhizopus oryzae Negative    46. Botrytis cinera Negative    47. Epicoccum nigrum Negative    48. Phoma betae Negative    49. Candida Albicans Negative    50. Trichophyton mentagrophytes Negative    51. Mite, D Farinae  5,000 AU/ml Negative    52. Mite, D Pteronyssinus  5,000 AU/ml Negative    53. Cat Hair 10,000 BAU/ml Negative    54.  Dog Epithelia Negative    55. Mixed Feathers Negative    56. Horse Epithelia Negative    57. Cockroach, German Negative    58. Mouse Negative    59. Tobacco Leaf Negative             Food Perc - 04/21/22 1400       Test Information   Time Antigen Placed 1409    Allergen Manufacturer Lavella Hammock    Location Back    Number of allergen test 10      Food   1. Peanut Negative    2. Soybean food Negative    3. Wheat, whole Negative    4. Sesame  Negative    5. Milk, cow Negative    6. Egg White, chicken Negative    7. Casein Negative    8. Shellfish mix Negative    9. Fish mix Negative    10. Cashew Negative             Intradermal - 04/21/22 1400     Time Antigen Placed 1451    Allergen Manufacturer Lavella Hammock    Location Back    Number of Test 12    Control Negative    7 Grass Negative    Ragweed mix Negative    Weed mix Negative    Tree mix Negative    Mold 1 Negative    Mold 2 Negative    Mold 3 Negative    Mold 4 Negative    Cat Negative    Dog Negative    Cockroach Negative    Mite mix Negative             Allergy testing results were read and interpreted by myself, documented by clinical staff.  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 - we can also send in atrovent (ipatropium) nasal spray-this will help with drainage and runny nose-can use in place of chlortabs  Follow-up with pulmonary as scheduled. Follow-up with our clinic as needed.   It was a pleasure meeting you today!   This note in its entirety was forwarded to the Provider who requested this consultation.  Thank you for your kind referral. I appreciate the opportunity to take part in Zoie's care. Please do not hesitate to contact me with questions.  Sincerely,  Sigurd Sos,  MD [facility] [place] [facility]

## 2022-04-21 ENCOUNTER — Encounter: Payer: Self-pay | Admitting: Internal Medicine

## 2022-04-21 ENCOUNTER — Ambulatory Visit
Admission: RE | Admit: 2022-04-21 | Discharge: 2022-04-21 | Disposition: A | Discharge status: Home / Self Care | Payer: Commercial Managed Care - HMO | Source: Ambulatory Visit | Attending: Internal Medicine | Admitting: Internal Medicine

## 2022-04-21 ENCOUNTER — Ambulatory Visit (INDEPENDENT_AMBULATORY_CARE_PROVIDER_SITE_OTHER): Payer: Commercial Managed Care - HMO | Admitting: Internal Medicine

## 2022-04-21 ENCOUNTER — Ambulatory Visit: Payer: Commercial Managed Care - HMO | Admitting: Internal Medicine

## 2022-04-21 VITALS — BP 100/62 | HR 70 | Temp 98.7°F | Resp 16 | Ht 66.0 in | Wt 122.5 lb

## 2022-04-21 VITALS — BP 100/66 | HR 71 | Ht 64.75 in | Wt 123.2 lb

## 2022-04-21 DIAGNOSIS — E042 Nontoxic multinodular goiter: Secondary | ICD-10-CM

## 2022-04-21 DIAGNOSIS — K219 Gastro-esophageal reflux disease without esophagitis: Secondary | ICD-10-CM | POA: Diagnosis not present

## 2022-04-21 DIAGNOSIS — R058 Other specified cough: Secondary | ICD-10-CM

## 2022-04-21 DIAGNOSIS — R053 Chronic cough: Secondary | ICD-10-CM | POA: Diagnosis not present

## 2022-04-21 DIAGNOSIS — J310 Chronic rhinitis: Secondary | ICD-10-CM | POA: Diagnosis not present

## 2022-04-21 LAB — T4, FREE: Free T4: 1 ng/dL (ref 0.60–1.60)

## 2022-04-21 LAB — TSH: TSH: 1.88 u[IU]/mL (ref 0.35–5.50)

## 2022-04-21 NOTE — Patient Instructions (Addendum)
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 - we can also send in atrovent (ipatropium) nasal spray-this will help with drainage and runny nose-can use in place of chlortabs  Follow-up with pulmonary as scheduled. Follow-up with our clinic as needed.   It was a pleasure meeting you today!

## 2022-04-21 NOTE — Progress Notes (Signed)
Name: [name]  MRN/ DOB: [id number], 10-Feb-1971    Age/ Sex: 51 y.o., female    PCP: Mosie Lukes, MD   Reason for Endocrinology Evaluation: MNG     Date of Initial Endocrinology Evaluation: 05/21/2021    HPI: [name] is a 51 y.o. female with a past medical history of ADHD, depression and anxiety. The patient presented for initial endocrinology clinic visit on 05/21/2021 for consultative assistance with her MNG.     She was noted to have multinodular goiter on thyroid ultrasound dated 04/13/2021, she was having cough at the time.  2 of the nodules have met criteria for FNA, which were performed on 04/16/2021  Right inferior nodule 2.6 cm cytology report consistent with scant cellularity with repeat in 07/2021 showing benign cytology  Left inferior nodule FNA 2.7 cm, showed benign cytology 04/2021  No prior exposure to radiation    No Fh of thyroid disease     SUBJECTIVE:    Today (04/21/22):  [name] is here for a follow up on MNG.   Weight has been stable  Denies local neck symptoms  Denies palpitations but feels " heart flip flops"  Denies loose stools or diarrhea  Denies tremors  Has noted occasional anxiety     HISTORY:  Past Medical History:  Past Medical History:  Diagnosis Date   Allergy    Alopecia    Anxiety and depression 07/31/2011   Chest pain 09/22/10   Sharp pains in left breast & sometimes in the right breast as well. Random in occurence & nonexertional. Also has occasional SOB & DOE when going up stairs & occasionally when working out. Nuclear Stress Treadmill 09/29/10 -    HSV-2 (herpes simplex virus 2) infection    IBS (irritable bowel syndrome)    Interstitial cystitis    Multiple allergies 07/02/2011   Neuromuscular disorder ([place])    past hx dx of Fibromyalgia   Otitis media 03/16/2012   Perimenopausal 07/22/2013   PONV (postoperative nausea and vomiting)    Poor concentration 07/02/2011    Shoulder pain, left 04/11/2012   SOB (shortness of breath) 09/22/10   ECHO 09/29/10 - NL LVF, trivial PE, trivial TR/PR.   UTI (lower urinary tract infection) 08/28/2011   Past Surgical History:  Past Surgical History:  Procedure Laterality Date   BREAST BIOPSY Right 03/12/2016   Procedure: breast exploration;  Surgeon: Jackolyn Confer, MD;  Location: [facility];  Service: General;  Laterality: Right;   BREAST ENHANCEMENT SURGERY  2007   CESAREAN SECTION  2001 and 2004   x2   COLONOSCOPY  2007   LAPAROSCOPY     twice   RHINOPLASTY  2003   [place]   TUBAL LIGATION     UPPER GASTROINTESTINAL ENDOSCOPY  2011    Social History:  reports that she has never smoked. She has never used smokeless tobacco. She reports current alcohol use. She reports that she does not use drugs. Family History: family history includes Allergies in her daughter and son; Alzheimer's disease in her mother; Anxiety disorder in her mother; Colon polyps in her mother; Diabetes in her brother; Heart disease in her mother; Hypertension in her brother; Kidney disease in her maternal grandmother; Mitral valve prolapse in her mother; Obesity in her brother.   HOME MEDICATIONS: Allergies as of 04/21/2022   No Known Allergies      Medication List        Accurate  as of April 21, 2022  8:02 AM. If you have any questions, ask your nurse or doctor.          AeroChamber Coca-Cola Use as needed with albuterol HFA   albuterol 108 (90 Base) MCG/ACT inhaler Commonly known as: VENTOLIN HFA Inhale 1-2 puffs into the lungs every 6 (six) hours as needed for wheezing or shortness of breath.   amphetamine-dextroamphetamine 20 MG tablet Commonly known as: ADDERALL Take 1 tablet (20 mg total) by mouth 2 (two) times daily.   benzonatate 200 MG capsule Commonly known as: TESSALON Take 1 capsule (200 mg total) by mouth 3 (three) times daily as needed for cough.   estradiol 0.5 MG  tablet Commonly known as: ESTRACE Take 1 tablet (0.5 mg total) by mouth daily. Take one tablet (0.5 mg) by mouth daily.   Estradiol 10 MCG Tabs vaginal tablet Commonly known as: Vagifem Place 1 tablet (10 mcg total) vaginally 2 (two) times a week. Use every night before bed for two weeks when you first begin this medicine, then after the first two weeks, begin using it twice a week.   famotidine 40 MG tablet Commonly known as: PEPCID TAKE 1 TABLET BY MOUTH EVERYDAY AT BEDTIME   fexofenadine 180 MG tablet Commonly known as: ALLEGRA TAKE 1 TABLET BY MOUTH EVERY DAY   finasteride 5 MG tablet Commonly known as: PROSCAR TAKE 1 TABLET BY MOUTH EVERY DAY   flunisolide 25 MCG/ACT (0.025%) Soln Commonly known as: NASALIDE Place 2 sprays into the nose 2 (two) times daily as needed. USE AS DIRECTED   hydrOXYzine 10 MG tablet Commonly known as: ATARAX Take 1-2 tablets (10-20 mg total) by mouth every 8 (eight) hours as needed (pain, insomnia).   ME/NaPhos/MB/Hyo1 81.6 MG Tabs Take 1 tablet by mouth every 6 (six) hours as needed (pain/buring).   meloxicam 15 MG tablet Commonly known as: MOBIC Take 15 mg by mouth daily as needed.   nitrofurantoin 50 MG capsule Commonly known as: MACRODANTIN Take one tablet (50 mg) by mouth once as needed for prevention of urinary tract infection.   progesterone 100 MG capsule Commonly known as: PROMETRIUM Take 1 capsule (100 mg total) by mouth at bedtime.   spironolactone 100 MG tablet Commonly known as: ALDACTONE Take 100 mg by mouth daily.   traMADol 50 MG tablet Commonly known as: ULTRAM Take by mouth.   tretinoin 0.05 % cream Commonly known as: RETIN-A Apply topically.   UNABLE TO FIND cocovia   valACYclovir 500 MG tablet Commonly known as: VALTREX Take 1 tablet (500 mg total) by mouth daily. Take daily for prevention.  Take 1 tablet by mouth twice daily for 3 days for an outbreak.   Valium 10 MG tablet Generic drug: diazepam 10  mg at bedtime.   Xiidra 5 % Soln Generic drug: Lifitegrast          REVIEW OF SYSTEMS: A comprehensive ROS was conducted with the patient and is negative except as per HPI     OBJECTIVE:  VS: BP 100/66 (BP Location: Left Arm, Patient Position: Sitting, Cuff Size: Small)   Pulse 71   Ht 5' 4.75" (1.645 m)   Wt 123 lb 3.2 oz (55.9 kg)   LMP 07/03/2015   SpO2 95%   BMI 20.66 kg/m    Wt Readings from Last 3 Encounters:  04/21/22 123 lb 3.2 oz (55.9 kg)  03/18/22 123 lb (55.8 kg)  01/26/22 123 lb (55.8 kg)     EXAM:  General: Pt appears well and is in NAD  Neck: General: Supple without adenopathy. Thyroid: Thyroid size normal.  Left thyroid nodule appreciated, and able to palpate the right on today's exam   Lungs: Clear with good BS bilat with no rales, rhonchi, or wheezes  Heart: Auscultation: RRR.  Abdomen: Normoactive bowel sounds, soft, nontender, without masses or organomegaly palpable  Extremities:  BL LE: No pretibial edema normal ROM and strength.  Neuro: Cranial nerves: II - XII grossly intact  DTRs: 2+ and symmetric in UE without delay in relaxation phase  Mental Status: Judgment, insight: Intact Orientation: Oriented to time, place, and person Mood and affect: No depression, anxiety, or agitation     DATA REVIEWED:     Latest Reference Range & Units 04/21/22 08:33  TSH 0.35 - 5.50 uIU/mL 1.88  T4,Free(Direct) 0.60 - 1.60 ng/dL 1.00    Thyroid ultrasound 04/13/2021  Estimated total number of nodules >/= 1 cm: 3   Number of spongiform nodules >/=  2 cm not described below (TR1): 0   Number of mixed cystic and solid nodules >/= 1.5 cm not described below ([place]): 0   _________________________________________________________   Nodule # 1:   Location: Right; Inferior   Maximum size: 2.6 cm; Other 2 dimensions: 1.7 cm x 2.0 cm   Composition: solid/almost completely solid (2)   Echogenicity: hypoechoic (2)   Shape: not taller-than-wide (0)    Margins: ill-defined (0)   Echogenic foci: none (0)   ACR TI-RADS total points: 4.   ACR TI-RADS risk category: TR4 (4-6 points).   ACR TI-RADS recommendations:   Nodule meets criteria for biopsy   _________________________________________________________   Nodule # 2:   Location: Right; Mid   Maximum size: 1.3 cm; Other 2 dimensions: 0.8 cm x 1.2 cm   Composition: mixed cystic and solid (1)   Echogenicity: hypoechoic (2)   Shape: not taller-than-wide (0)   Margins: ill-defined (0)   Echogenic foci: none (0)   ACR TI-RADS total points: 3.   ACR TI-RADS risk category: TR1 (0-1 points).   ACR TI-RADS recommendations:   Nodule does not meet criteria for surveillance or biopsy   _________________________________________________________   Nodule # 3:   Location: Left; Inferior   Maximum size: 2.7 cm; Other 2 dimensions: 1.4 cm x 1.6 cm   Composition: solid/almost completely solid (2)   Echogenicity: isoechoic (1)   Shape: not taller-than-wide (0)   Margins: ill-defined (0)   Echogenic foci: none (0)   ACR TI-RADS total points: 3.   ACR TI-RADS risk category: TR3 (3 points).   ACR TI-RADS recommendations:   Nodule meets criteria for biopsy   _________________________________________________________   Nodule # 4:   Location: Left; Inferior   Maximum size: 0.8 cm; Other 2 dimensions: 0.7 cm x 0.7 cm   Composition: cannot determine (2)   Echogenicity: hypoechoic (2)   Shape: not taller-than-wide (0)   Margins: ill-defined (0)   Echogenic foci: none (0)   ACR TI-RADS total points: 4.   ACR TI-RADS risk category: TR4 (4-6 points).   ACR TI-RADS recommendations:   Nodule does not meet criteria for surveillance or biopsy   _________________________________________________________   No adenopathy   IMPRESSION: Multinodular thyroid.   Right inferior thyroid nodule (labeled 1, 2.6 cm, TR 4), and the left inferior thyroid nodule  (labeled 3, 2.7 cm, TR 3) both meet criteria for biopsy, as designated by the newly established ACR TI-RADS criteria, and referral for biopsy is recommended.  Right inferior nodule FNA 04/16/2021  Clinical History: Nodule #1 right inferior 2.6 x 1.7 x 2.0 cm  Specimen Submitted:  A. THYROID, RIGHT INFERIOR, FINE NEEDLE ASPIRATION:    FINAL MICROSCOPIC DIAGNOSIS:  - Scant follicular epithelium present (Bethesda category I)   Repeat FNA right inferior 07/21/2021 FINAL MICROSCOPIC DIAGNOSIS:  - Consistent with benign follicular nodule (Bethesda category II)    Left inferior nodule FNA 04/16/2021  Clinical History: Nodule #3 left inferior 2.7 x 1.4 x 1.6 cm  Specimen Submitted:  A. THYROID, LEFT INFERIOR, FINE NEEDLE ASPIRATION:    FINAL MICROSCOPIC DIAGNOSIS:  - Consistent with benign follicular nodule (Bethesda category II)   ASSESSMENT/PLAN/RECOMMENDATIONS:   Multinodular goiter  -Patient is biochemically euthyroid -No local neck symptoms -She is S/P  benign FNA of the left inferior thyroid nodule in June 2022 -She is also S/P  FNA of the right thyroid nodule with initial cytology report of scant cellularity Bethesda category I in June 2022, repeat FNA in September 2022 was benign -We will proceed with another thyroid ultrasound follow-up for this year   Follow-up in 1 year  Signed electronically by: Mack Guise, MD  [facility]  [address] [place], [place] [address] Phone: [phone] FAX: [phone]   CC: Mosie Lukes, [address] [place] [address] Phone: [phone] Fax: [phone]   Return to Endocrinology clinic as below: Future Appointments  Date Time Provider [place]  04/21/2022  1:30 PM Clemon Chambers, MD [facility] None  04/22/2022 10:10 AM [facility] MM 2 [facility]  06/02/2022  2:40 PM [facility] DEXA RM 1 [facility] None  07/15/2022 11:00  AM Parrett, Fonnie Mu, NP [facility] None

## 2022-04-22 ENCOUNTER — Ambulatory Visit
Admission: RE | Admit: 2022-04-22 | Discharge: 2022-04-22 | Disposition: A | Discharge status: Home / Self Care | Payer: Commercial Managed Care - HMO | Source: Ambulatory Visit | Attending: Obstetrics and Gynecology | Admitting: Obstetrics and Gynecology

## 2022-04-22 DIAGNOSIS — Z1231 Encounter for screening mammogram for malignant neoplasm of breast: Secondary | ICD-10-CM

## 2022-04-22 LAB — T3: T3, Total: 98 ng/dL (ref 76–181)

## 2022-06-01 ENCOUNTER — Other Ambulatory Visit: Payer: Self-pay | Admitting: *Deleted

## 2022-06-01 DIAGNOSIS — Z13820 Encounter for screening for osteoporosis: Secondary | ICD-10-CM

## 2022-06-02 ENCOUNTER — Ambulatory Visit: Payer: Commercial Managed Care - HMO | Admitting: Family

## 2022-06-02 ENCOUNTER — Ambulatory Visit: Payer: Managed Care, Other (non HMO) | Admitting: Internal Medicine

## 2022-06-02 ENCOUNTER — Other Ambulatory Visit: Payer: Self-pay | Admitting: Obstetrics and Gynecology

## 2022-06-02 ENCOUNTER — Ambulatory Visit (INDEPENDENT_AMBULATORY_CARE_PROVIDER_SITE_OTHER): Payer: Commercial Managed Care - HMO

## 2022-06-02 VITALS — BP 105/40 | HR 76 | Temp 98.6°F | Resp 16 | Wt 123.8 lb

## 2022-06-02 DIAGNOSIS — R3915 Urgency of urination: Secondary | ICD-10-CM

## 2022-06-02 DIAGNOSIS — M85852 Other specified disorders of bone density and structure, left thigh: Secondary | ICD-10-CM

## 2022-06-02 DIAGNOSIS — Z13820 Encounter for screening for osteoporosis: Secondary | ICD-10-CM

## 2022-06-02 DIAGNOSIS — Z780 Asymptomatic menopausal state: Secondary | ICD-10-CM

## 2022-06-02 LAB — POC URINALSYSI DIPSTICK (AUTOMATED)
Blood, UA: NEGATIVE
Glucose, UA: NEGATIVE
Ketones, UA: NEGATIVE
Leukocytes, UA: NEGATIVE
Nitrite, UA: NEGATIVE
Protein, UA: NEGATIVE
Spec Grav, UA: 1.02 (ref 1.010–1.025)
Urobilinogen, UA: 0.2 E.U./dL
pH, UA: 6 (ref 5.0–8.0)

## 2022-06-02 MED ORDER — FLUCONAZOLE 150 MG PO TABS: ORAL_TABLET | ORAL | 0 refills | Status: DC

## 2022-06-02 MED ORDER — NITROFURANTOIN MONOHYD MACRO 100 MG PO CAPS: 100.0000 mg | ORAL_CAPSULE | Freq: Two times a day (BID) | ORAL | 0 refills | Status: DC

## 2022-06-02 NOTE — Assessment & Plan Note (Signed)
New. Also has hx of IC. UA looks clear, but she may have partially treated UTI with macrobid. Will increase macrobid to full treatment dose '100mg'$  bid and send urine for formal culture. She is advised to call if symptoms worsen or if symptoms do not improve.

## 2022-06-02 NOTE — Progress Notes (Signed)
Subjective:   By signing my name below, I, Luiz Ochoa, attest that this documentation has been prepared under the direction and in the presence of [name], NP 06/02/2022    Patient ID: [name], female    DOB: December 06, 1970, 51 y.o.   MRN: [id number]  No chief complaint on file.   HPI Patient is in today for follow up visit.  UTI Symptoms: She has been experiencing symptoms of urinary tract infection, and states she goes to the bathroom frequently. She denies having abdominal tenderness.  Yeast Infection: Of note, she is prone to yeast infections.  Back Pain: She complains of lower back pain.   Medications: She is remains complaint with a bladder anesthetic and ***Macrobid manage her yeast infection symptoms after intercourse.   Health Maintenance Due  Topic Date Due   COVID-19 Vaccine (4 - Pfizer series) 11/27/2020   INFLUENZA VACCINE  06/01/2022    Past Medical History:  Diagnosis Date   Allergy    Alopecia    Anxiety and depression 07/31/2011   Chest pain 09/22/10   Sharp pains in left breast & sometimes in the right breast as well. Random in occurence & nonexertional. Also has occasional SOB & DOE when going up stairs & occasionally when working out. Nuclear Stress Treadmill 09/29/10 -    HSV-2 (herpes simplex virus 2) infection    IBS (irritable bowel syndrome)    Interstitial cystitis    Multiple allergies 07/02/2011   Neuromuscular disorder ([place])    past hx dx of Fibromyalgia   Otitis media 03/16/2012   Perimenopausal 07/22/2013   PONV (postoperative nausea and vomiting)    Poor concentration 07/02/2011   Shoulder pain, left 04/11/2012   SOB (shortness of breath) 09/22/10   ECHO 09/29/10 - NL LVF, trivial PE, trivial TR/PR.   UTI (lower urinary tract infection) 08/28/2011    Past Surgical History:  Procedure Laterality Date   BREAST BIOPSY Right 03/12/2016   Procedure: breast exploration;  Surgeon: Jackolyn Confer, MD;  Location: [facility];  Service: General;  Laterality: Right;   BREAST ENHANCEMENT SURGERY  2007   CESAREAN SECTION  2001 and 2004   x2   COLONOSCOPY  2007   LAPAROSCOPY     twice   RHINOPLASTY  2003   TONSILLECTOMY  1982   TUBAL LIGATION     UPPER GASTROINTESTINAL ENDOSCOPY  2011    Family History  Problem Relation Age of Onset   Heart disease Mother        MVP   Anxiety disorder Mother    Mitral valve prolapse Mother    Alzheimer's disease Mother    Colon polyps Mother    Hypertension Brother    Obesity Brother    Diabetes Brother        DM, obese   Allergies Daughter    Allergies Son    Kidney disease Maternal Grandmother        uremia   Breast cancer Neg Hx    Colon cancer Neg Hx    Esophageal cancer Neg Hx    Rectal cancer Neg Hx    Stomach cancer Neg Hx     Social History   Socioeconomic History   Marital status: Divorced    Spouse name: Not on file   Number of children: Not on file   Years of education: Not on file   Highest education level: Not on file  Occupational History   Not on file  Tobacco Use  Smoking status: Never   Smokeless tobacco: Never  Vaping Use   Vaping Use: Never used  Substance and Sexual Activity   Alcohol use: Yes    Comment: occasional   Drug use: Never   Sexual activity: Yes    Partners: Male    Birth control/protection: Surgical    Comment: Tubal lig-1st intercourse 51 yo-More than 5 partners  Other Topics Concern   Not on file  Social History Narrative   Not on file   Social Determinants of Health   Financial Resource Strain: Not on file  Food Insecurity: Not on file  Transportation Needs: Not on file  Physical Activity: Not on file  Stress: Not on file  Social Connections: Not on file  Intimate Partner Violence: Not on file    Outpatient Medications Prior to Visit  Medication Sig Dispense Refill   albuterol (VENTOLIN HFA) 108 (90 Base) MCG/ACT inhaler Inhale 1-2 puffs into the lungs every 6 (six) hours as  needed for wheezing or shortness of breath. 8 g 1   amphetamine-dextroamphetamine (ADDERALL) 20 MG tablet Take 1 tablet (20 mg total) by mouth 2 (two) times daily. 60 tablet 0   benzonatate (TESSALON) 200 MG capsule Take 1 capsule (200 mg total) by mouth 3 (three) times daily as needed for cough. 45 capsule 5   estradiol (ESTRACE) 0.5 MG tablet Take 1 tablet (0.5 mg total) by mouth daily. Take one tablet (0.5 mg) by mouth daily. 90 tablet 4   Estradiol (VAGIFEM) 10 MCG TABS vaginal tablet Place 1 tablet (10 mcg total) vaginally 2 (two) times a week. Use every night before bed for two weeks when you first begin this medicine, then after the first two weeks, begin using it twice a week. 34 tablet 3   famotidine (PEPCID) 40 MG tablet TAKE 1 TABLET BY MOUTH EVERYDAY AT BEDTIME 90 tablet 0   fexofenadine (ALLEGRA) 180 MG tablet TAKE 1 TABLET BY MOUTH EVERY DAY 90 tablet 1   finasteride (PROSCAR) 5 MG tablet TAKE 1 TABLET BY MOUTH EVERY DAY 90 tablet 0   flunisolide (NASALIDE) 25 MCG/ACT (0.025%) SOLN Place 2 sprays into the nose 2 (two) times daily as needed. USE AS DIRECTED 75 mL 3   hydrOXYzine (ATARAX/VISTARIL) 10 MG tablet Take 1-2 tablets (10-20 mg total) by mouth every 8 (eight) hours as needed (pain, insomnia). 90 tablet 1   meloxicam (MOBIC) 15 MG tablet Take 15 mg by mouth daily as needed.     Methen-Hyosc-Meth Blue-Na Phos (ME/NAPHOS/MB/HYO1) 81.6 MG TABS Take 1 tablet by mouth every 6 (six) hours as needed (pain/buring). 60 tablet 2   nitrofurantoin (MACRODANTIN) 50 MG capsule Take one tablet (50 mg) by mouth once as needed for prevention of urinary tract infection. 30 capsule 3   progesterone (PROMETRIUM) 100 MG capsule Take 1 capsule (100 mg total) by mouth at bedtime. 90 capsule 4   Spacer/Aero-Holding Chambers (AEROCHAMBER MINI CHAMBER) DEVI Use as needed with albuterol HFA 1 each 0   spironolactone (ALDACTONE) 100 MG tablet Take 100 mg by mouth daily.     traMADol (ULTRAM) 50 MG tablet  Take by mouth.     tretinoin (RETIN-A) 0.05 % cream Apply topically.     UNABLE TO FIND cocovia     valACYclovir (VALTREX) 500 MG tablet Take 1 tablet (500 mg total) by mouth daily. Take daily for prevention.  Take 1 tablet by mouth twice daily for 3 days for an outbreak. 110 tablet 3   VALIUM 10 MG  tablet 10 mg at bedtime.     XIIDRA 5 % SOLN      No facility-administered medications prior to visit.    No Known Allergies  Review of Systems  Genitourinary:  Positive for frequency and urgency.       (+) UTI symptoms  Musculoskeletal:  Positive for back pain (lower back).       Objective:    Physical Exam Constitutional:      Appearance: Normal appearance. She is not ill-appearing.  HENT:     Head: Normocephalic and atraumatic.     Right Ear: External ear normal.     Left Ear: External ear normal.  Eyes:     Extraocular Movements: Extraocular movements intact.     Pupils: Pupils are equal, round, and reactive to light.  Cardiovascular:     Rate and Rhythm: Normal rate and regular rhythm.     Pulses: Normal pulses.     Heart sounds: Normal heart sounds. No murmur heard.    No gallop.  Pulmonary:     Effort: Pulmonary effort is normal. No respiratory distress.     Breath sounds: Normal breath sounds. No wheezing or rales.  Abdominal:     General: Bowel sounds are normal.     Palpations: Abdomen is soft.     Tenderness: There is no abdominal tenderness.  Musculoskeletal:     Comments: (+) CVA Tenderness***  Lymphadenopathy:     Cervical: No cervical adenopathy.  Skin:    General: Skin is warm and dry.  Neurological:     Mental Status: She is alert and oriented to person, place, and time.  Psychiatric:        Judgment: Judgment normal.     LMP 07/03/2015  Wt Readings from Last 3 Encounters:  04/21/22 122 lb 8 oz (55.6 kg)  04/21/22 123 lb 3.2 oz (55.9 kg)  03/18/22 123 lb (55.8 kg)       Assessment & Plan:   Problem List Items Addressed This Visit    None    No orders of the defined types were placed in this encounter.   I, Luiz Ochoa, personally preformed the services described in this documentation.  All medical record entries made by the scribe were at my direction and in my presence.  I have reviewed the chart and discharge instructions (if applicable) and agree that the record reflects my personal performance and is accurate and complete. 8//2023   I,Tinashe Williams,acting as a scribe for Nance Pear, NP.,have documented all relevant documentation on the behalf of Nance Pear, NP,as directed by  Nance Pear, NP while in the presence of Nance Pear, NP.    Luiz Ochoa

## 2022-06-04 LAB — URINE CULTURE
MICRO NUMBER:: 13726343
SPECIMEN QUALITY:: ADEQUATE

## 2022-06-06 ENCOUNTER — Encounter: Payer: Self-pay | Admitting: Family

## 2022-06-07 ENCOUNTER — Telehealth: Payer: Self-pay | Admitting: Family

## 2022-06-07 ENCOUNTER — Other Ambulatory Visit (HOSPITAL_COMMUNITY)
Admission: RE | Admit: 2022-06-07 | Discharge: 2022-06-07 | Disposition: A | Discharge status: Home / Self Care | Payer: Commercial Managed Care - HMO | Source: Ambulatory Visit | Attending: Family | Admitting: Family

## 2022-06-07 ENCOUNTER — Ambulatory Visit (HOSPITAL_BASED_OUTPATIENT_CLINIC_OR_DEPARTMENT_OTHER)
Admission: RE | Admit: 2022-06-07 | Discharge: 2022-06-07 | Disposition: A | Discharge status: Home / Self Care | Payer: Commercial Managed Care - HMO | Source: Ambulatory Visit | Attending: Family | Admitting: Family

## 2022-06-07 ENCOUNTER — Encounter: Payer: Self-pay | Admitting: *Deleted

## 2022-06-07 ENCOUNTER — Ambulatory Visit (INDEPENDENT_AMBULATORY_CARE_PROVIDER_SITE_OTHER): Payer: Commercial Managed Care - HMO | Admitting: Family

## 2022-06-07 VITALS — BP 92/46 | HR 75 | Temp 98.3°F | Resp 16

## 2022-06-07 DIAGNOSIS — Z113 Encounter for screening for infections with a predominantly sexual mode of transmission: Secondary | ICD-10-CM | POA: Insufficient documentation

## 2022-06-07 DIAGNOSIS — R102 Pelvic and perineal pain: Secondary | ICD-10-CM | POA: Insufficient documentation

## 2022-06-07 DIAGNOSIS — N3010 Interstitial cystitis (chronic) without hematuria: Secondary | ICD-10-CM | POA: Diagnosis not present

## 2022-06-07 DIAGNOSIS — N3000 Acute cystitis without hematuria: Secondary | ICD-10-CM | POA: Diagnosis not present

## 2022-06-07 DIAGNOSIS — M545 Low back pain, unspecified: Secondary | ICD-10-CM | POA: Diagnosis present

## 2022-06-07 DIAGNOSIS — I952 Hypotension due to drugs: Secondary | ICD-10-CM

## 2022-06-07 LAB — URINALYSIS, ROUTINE W REFLEX MICROSCOPIC
Bilirubin Urine: NEGATIVE
Hgb urine dipstick: NEGATIVE
Ketones, ur: NEGATIVE
Leukocytes,Ua: NEGATIVE
Nitrite: NEGATIVE
RBC / HPF: NONE SEEN (ref 0–?)
Specific Gravity, Urine: 1.02 (ref 1.000–1.030)
Total Protein, Urine: NEGATIVE
Urine Glucose: NEGATIVE
Urobilinogen, UA: 0.2 (ref 0.0–1.0)
pH: 7 (ref 5.0–8.0)

## 2022-06-07 MED ORDER — MELOXICAM 7.5 MG PO TABS: 7.5000 mg | ORAL_TABLET | Freq: Every day | ORAL | 0 refills | Status: DC

## 2022-06-07 MED ORDER — TRAMADOL HCL 50 MG PO TABS: 50.0000 mg | ORAL_TABLET | Freq: Four times a day (QID) | ORAL | 0 refills | Status: DC | PRN

## 2022-06-07 NOTE — Telephone Encounter (Signed)
See my chart message to pt  

## 2022-06-07 NOTE — Progress Notes (Signed)
Subjective:     Patient ID: [name], female    DOB: 21-Apr-1971, 51 y.o.   MRN: [id number]  Chief Complaint  Patient presents with   Vaginal Pain    Complains of vaginal pain in the past 3 days   Dizziness    Complains of dizziness episode at church Sunday while standing up    Vaginal Pain  Dizziness   Patient is in today with several concerns.  1) BP- She is currently maintained on aldactone.  BP Readings from Last 3 Encounters:  06/07/22 (!) 92/46  06/02/22 (!) 105/40  04/21/22 100/62   2) UTI- We saw her on 8/2 and urine culture grew pan sensitive e.coli. We placed her on macrobid.   She does report some low back pain.   Health Maintenance Due  Topic Date Due   COVID-19 Vaccine (4 - Pfizer series) 11/27/2020   INFLUENZA VACCINE  06/01/2022    Past Medical History:  Diagnosis Date   Allergy    Alopecia    Anxiety and depression 07/31/2011   Chest pain 09/22/10   Sharp pains in left breast & sometimes in the right breast as well. Random in occurence & nonexertional. Also has occasional SOB & DOE when going up stairs & occasionally when working out. Nuclear Stress Treadmill 09/29/10 -    HSV-2 (herpes simplex virus 2) infection    IBS (irritable bowel syndrome)    Interstitial cystitis    Multiple allergies 07/02/2011   Neuromuscular disorder ([place])    past hx dx of Fibromyalgia   Otitis media 03/16/2012   Perimenopausal 07/22/2013   PONV (postoperative nausea and vomiting)    Poor concentration 07/02/2011   Shoulder pain, left 04/11/2012   SOB (shortness of breath) 09/22/10   ECHO 09/29/10 - NL LVF, trivial PE, trivial TR/PR.   UTI (lower urinary tract infection) 08/28/2011    Past Surgical History:  Procedure Laterality Date   BREAST BIOPSY Right 03/12/2016   Procedure: breast exploration;  Surgeon: Jackolyn Confer, MD;  Location: [facility];  Service: General;  Laterality: Right;   BREAST ENHANCEMENT SURGERY  2007   CESAREAN  SECTION  2001 and 2004   x2   COLONOSCOPY  2007   LAPAROSCOPY     twice   RHINOPLASTY  2003   TONSILLECTOMY  1982   TUBAL LIGATION     UPPER GASTROINTESTINAL ENDOSCOPY  2011    Family History  Problem Relation Age of Onset   Heart disease Mother        MVP   Anxiety disorder Mother    Mitral valve prolapse Mother    Alzheimer's disease Mother    Colon polyps Mother    Hypertension Brother    Obesity Brother    Diabetes Brother        DM, obese   Allergies Daughter    Allergies Son    Kidney disease Maternal Grandmother        uremia   Breast cancer Neg Hx    Colon cancer Neg Hx    Esophageal cancer Neg Hx    Rectal cancer Neg Hx    Stomach cancer Neg Hx     Social History   Socioeconomic History   Marital status: Divorced    Spouse name: Not on file   Number of children: Not on file   Years of education: Not on file   Highest education level: Not on file  Occupational History   Not on file  Tobacco Use   Smoking status: Never   Smokeless tobacco: Never  Vaping Use   Vaping Use: Never used  Substance and Sexual Activity   Alcohol use: Yes    Comment: occasional   Drug use: Never   Sexual activity: Yes    Partners: Male    Birth control/protection: Surgical    Comment: Tubal lig-1st intercourse 51 yo-More than 5 partners  Other Topics Concern   Not on file  Social History Narrative   Not on file   Social Determinants of Health   Financial Resource Strain: Not on file  Food Insecurity: Not on file  Transportation Needs: Not on file  Physical Activity: Not on file  Stress: Not on file  Social Connections: Not on file  Intimate Partner Violence: Not on file    Outpatient Medications Prior to Visit  Medication Sig Dispense Refill   albuterol (VENTOLIN HFA) 108 (90 Base) MCG/ACT inhaler Inhale 1-2 puffs into the lungs every 6 (six) hours as needed for wheezing or shortness of breath. 8 g 1   amphetamine-dextroamphetamine (ADDERALL) 20 MG tablet  Take 1 tablet (20 mg total) by mouth 2 (two) times daily. 60 tablet 0   benzonatate (TESSALON) 200 MG capsule Take 1 capsule (200 mg total) by mouth 3 (three) times daily as needed for cough. 45 capsule 5   estradiol (ESTRACE) 0.5 MG tablet Take 1 tablet (0.5 mg total) by mouth daily. Take one tablet (0.5 mg) by mouth daily. 90 tablet 4   Estradiol (VAGIFEM) 10 MCG TABS vaginal tablet Place 1 tablet (10 mcg total) vaginally 2 (two) times a week. Use every night before bed for two weeks when you first begin this medicine, then after the first two weeks, begin using it twice a week. 34 tablet 3   famotidine (PEPCID) 40 MG tablet TAKE 1 TABLET BY MOUTH EVERYDAY AT BEDTIME 90 tablet 0   fexofenadine (ALLEGRA) 180 MG tablet TAKE 1 TABLET BY MOUTH EVERY DAY 90 tablet 1   finasteride (PROSCAR) 5 MG tablet TAKE 1 TABLET BY MOUTH EVERY DAY 90 tablet 0   fluconazole (DIFLUCAN) 150 MG tablet Take 1 tablet as needed for yeast infection, may repeat in 3 days as needed. 2 tablet 0   flunisolide (NASALIDE) 25 MCG/ACT (0.025%) SOLN Place 2 sprays into the nose 2 (two) times daily as needed. USE AS DIRECTED 75 mL 3   hydrOXYzine (ATARAX/VISTARIL) 10 MG tablet Take 1-2 tablets (10-20 mg total) by mouth every 8 (eight) hours as needed (pain, insomnia). 90 tablet 1   Methen-Hyosc-Meth Blue-Na Phos (ME/NAPHOS/MB/HYO1) 81.6 MG TABS Take 1 tablet by mouth every 6 (six) hours as needed (pain/buring). 60 tablet 2   nitrofurantoin (MACRODANTIN) 50 MG capsule Take one tablet (50 mg) by mouth once as needed for prevention of urinary tract infection. 30 capsule 3   nitrofurantoin, macrocrystal-monohydrate, (MACROBID) 100 MG capsule Take 1 capsule (100 mg total) by mouth 2 (two) times daily. 10 capsule 0   progesterone (PROMETRIUM) 100 MG capsule Take 1 capsule (100 mg total) by mouth at bedtime. 90 capsule 4   Spacer/Aero-Holding Chambers (AEROCHAMBER MINI CHAMBER) DEVI Use as needed with albuterol HFA 1 each 0   tretinoin  (RETIN-A) 0.05 % cream Apply topically.     UNABLE TO FIND cocovia     valACYclovir (VALTREX) 500 MG tablet Take 1 tablet (500 mg total) by mouth daily. Take daily for prevention.  Take 1 tablet by mouth twice daily for 3 days for an outbreak.  110 tablet 3   VALIUM 10 MG tablet 10 mg at bedtime.     XIIDRA 5 % SOLN      meloxicam (MOBIC) 15 MG tablet Take 15 mg by mouth daily as needed.     spironolactone (ALDACTONE) 100 MG tablet Take 100 mg by mouth daily.     traMADol (ULTRAM) 50 MG tablet Take by mouth.     No facility-administered medications prior to visit.    No Known Allergies  Review of Systems  Genitourinary:  Positive for vaginal pain.  Neurological:  Positive for dizziness.   See HPI    Objective:    Physical Exam Exam conducted with a chaperone present.  Constitutional:      Appearance: Normal appearance.  Genitourinary:    General: Normal vulva.     Exam position: Lithotomy position.     Labia:        Right: No rash.        Left: No rash.      Vagina: Normal.     Cervix: Normal.     Uterus: Normal.      Adnexa: Right adnexa normal and left adnexa normal.       Right: No mass or tenderness.         Left: No mass or tenderness.       Comments: Some tenderness anterior vaginal wall against bladder Neurological:     Mental Status: She is alert.     BP (!) 92/46 (BP Location: Right Arm, Patient Position: Sitting, Cuff Size: Small)   Pulse 75   Temp 98.3 F (36.8 C) (Oral)   Resp 16   LMP 07/03/2015   SpO2 100%  Wt Readings from Last 3 Encounters:  06/02/22 123 lb 12.8 oz (56.2 kg)  04/21/22 122 lb 8 oz (55.6 kg)  04/21/22 123 lb 3.2 oz (55.9 kg)       Assessment & Plan:   Problem List Items Addressed This Visit       Unprioritized   Vaginal pain    New.  I suspect that burning pain is either due to pending HSV outbreak or due to l1-l2 right lumbar radiculopathy. She continues daily suppressive therapy of valtrex.  Normal vaginal exam.  Swab  will be sent for BV/Yeast/STD screening. Trial of meloxicam 7.'5mg'$  once daily to see if this helps. She is requesting lumbar x-ray.        Relevant Orders   Cervicovaginal ancillary only   Interstitial cystitis    She is requesting a refill of tramadol for prn use. She is going out of town. I gave her #15 tabs. Reviewed [place] Controlled substance registry. Refills appropriate.      Hypotension due to drugs    BP Readings from Last 3 Encounters:  06/07/22 (!) 92/46  06/02/22 (!) 105/40  04/21/22 100/62  BP is low. Suspect orthostatic hypotension the other day at church. Pt is advised to stop aldactone for now (this is being prescribed for hair loss by dermatology).  She will monitor her blood pressure at home for the next few weeks and let us know if her symptoms do not improve.       Other Visit Diagnoses     Acute cystitis without hematuria    -  Primary   Relevant Orders   Urine Culture   HIV antibody (with reflex)   Urinalysis, Routine w reflex microscopic   Acute right-sided low back pain without sciatica       Relevant  Medications   meloxicam (MOBIC) 7.5 MG tablet   traMADol (ULTRAM) 50 MG tablet   Other Relevant Orders   DG Lumbar Spine Complete       I have discontinued Weda J. Jury "Blanca"'s spironolactone and meloxicam. I have also changed her traMADol. Additionally, I am having her start on meloxicam. Lastly, I am having her maintain her tretinoin, Xiidra, UNABLE TO FIND, AeroChamber Mini Chamber, amphetamine-dextroamphetamine, albuterol, ME/NaPhos/MB/Hyo1, hydrOXYzine, flunisolide, fexofenadine, Valium, benzonatate, famotidine, finasteride, progesterone, estradiol, Estradiol, valACYclovir, nitrofurantoin, fluconazole, and nitrofurantoin (macrocrystal-monohydrate).  Meds ordered this encounter  Medications   meloxicam (MOBIC) 7.5 MG tablet    Sig: Take 1 tablet (7.5 mg total) by mouth daily.    Dispense:  14 tablet    Refill:  0    Order Specific Question:    Supervising Provider    Answer:   Penni Homans A [[id number]]   traMADol (ULTRAM) 50 MG tablet    Sig: Take 1 tablet (50 mg total) by mouth every 6 (six) hours as needed.    Dispense:  15 tablet    Refill:  0    Order Specific Question:   Supervising Provider    Answer:   Penni Homans A [[id number]]

## 2022-06-07 NOTE — Assessment & Plan Note (Addendum)
New.  I suspect that burning pain is either due to pending HSV outbreak or due to l1-l2 right lumbar radiculopathy. She continues daily suppressive therapy of valtrex.  Normal vaginal exam.  Swab will be sent for BV/Yeast/STD screening. Trial of meloxicam 7.'5mg'$  once daily to see if this helps. She is requesting lumbar x-ray.

## 2022-06-07 NOTE — Assessment & Plan Note (Signed)
BP Readings from Last 3 Encounters:  06/07/22 (!) 92/46  06/02/22 (!) 105/40  04/21/22 100/62   BP is low. Suspect orthostatic hypotension the other day at church. Pt is advised to stop aldactone for now (this is being prescribed for hair loss by dermatology).  She will monitor her blood pressure at home for the next few weeks and let us know if her symptoms do not improve.

## 2022-06-07 NOTE — Assessment & Plan Note (Signed)
She is requesting a refill of tramadol for prn use. She is going out of town. I gave her #15 tabs. Reviewed [place] Controlled substance registry. Refills appropriate.

## 2022-06-07 NOTE — Telephone Encounter (Signed)
Patient scheduled for today

## 2022-06-08 LAB — URINE CULTURE
MICRO NUMBER:: 13744603
Result:: NO GROWTH
SPECIMEN QUALITY:: ADEQUATE

## 2022-06-08 LAB — CERVICOVAGINAL ANCILLARY ONLY
Bacterial Vaginitis (gardnerella): NEGATIVE
Candida Glabrata: NEGATIVE
Candida Vaginitis: NEGATIVE
Chlamydia: NEGATIVE
Comment: NEGATIVE
Comment: NEGATIVE
Comment: NEGATIVE
Comment: NEGATIVE
Comment: NEGATIVE
Comment: NORMAL
Neisseria Gonorrhea: NEGATIVE
Trichomonas: NEGATIVE

## 2022-06-08 LAB — HIV ANTIBODY (ROUTINE TESTING W REFLEX): HIV 1&2 Ab, 4th Generation: NONREACTIVE

## 2022-06-10 ENCOUNTER — Telehealth: Payer: Self-pay | Admitting: Family

## 2022-06-10 NOTE — Telephone Encounter (Signed)
See mychart.  

## 2022-06-18 ENCOUNTER — Other Ambulatory Visit: Payer: Self-pay | Admitting: Adult Health

## 2022-06-22 ENCOUNTER — Other Ambulatory Visit: Payer: Self-pay | Admitting: Family Medicine

## 2022-07-12 ENCOUNTER — Telehealth: Payer: Self-pay | Admitting: Family Medicine

## 2022-07-12 DIAGNOSIS — N951 Menopausal and female climacteric states: Secondary | ICD-10-CM

## 2022-07-12 DIAGNOSIS — Z0000 Encounter for general adult medical examination without abnormal findings: Secondary | ICD-10-CM

## 2022-07-12 NOTE — Telephone Encounter (Signed)
Referred to Dr. Gae Dry was placed

## 2022-07-12 NOTE — Telephone Encounter (Signed)
Patient called to find out the name of the OBGYN office she was referred to about a year go. She said it was a female provider. Looked at her chart and advised that the patient was referred to Dr. Gae Dry in 2021 but that I didn't see a female provider -OBGYN referral. Patient requested a call from nurse so they can check with DR. Charlett Blake about the name of the female OBGYN she wanted her to see. Please call to advise.

## 2022-07-14 NOTE — Telephone Encounter (Signed)
Hi Dr. Charlett Blake, pt states she don't want to see Dr. Princess Bruins  or Dr.Amundson Josefa Half but  Ask for referral to anyone else.

## 2022-07-15 ENCOUNTER — Ambulatory Visit: Payer: Managed Care, Other (non HMO) | Admitting: Adult Health

## 2022-07-23 ENCOUNTER — Ambulatory Visit: Payer: Commercial Managed Care - HMO | Admitting: Family Medicine

## 2022-07-23 ENCOUNTER — Encounter: Payer: Self-pay | Admitting: Family Medicine

## 2022-07-23 VITALS — BP 110/60 | HR 76 | Temp 98.2°F | Resp 18 | Ht 66.0 in | Wt 128.4 lb

## 2022-07-23 DIAGNOSIS — R6889 Other general symptoms and signs: Secondary | ICD-10-CM

## 2022-07-23 DIAGNOSIS — N3010 Interstitial cystitis (chronic) without hematuria: Secondary | ICD-10-CM | POA: Diagnosis not present

## 2022-07-23 LAB — POC COVID19 BINAXNOW: SARS Coronavirus 2 Ag: NEGATIVE

## 2022-07-23 LAB — POC URINALSYSI DIPSTICK (AUTOMATED)
Bilirubin, UA: NEGATIVE
Blood, UA: NEGATIVE
Glucose, UA: NEGATIVE
Ketones, UA: NEGATIVE
Leukocytes, UA: NEGATIVE
Nitrite, UA: NEGATIVE
Protein, UA: NEGATIVE
Spec Grav, UA: 1.025 (ref 1.010–1.025)
Urobilinogen, UA: 0.2 E.U./dL
pH, UA: 6 (ref 5.0–8.0)

## 2022-07-23 MED ORDER — PHENAZOPYRIDINE HCL 200 MG PO TABS: 200.0000 mg | ORAL_TABLET | Freq: Three times a day (TID) | ORAL | 2 refills | Status: AC | PRN

## 2022-07-23 NOTE — Progress Notes (Signed)
Chief Complaint  Patient presents with   Dysuria    [name] is a 51 y.o. female here for possible UTI.  Duration: 1 week. Symptoms: Dysuria, urinary frequency, urinary hesitancy, urinary retention, and urgency Denies: hematuria, fever, nausea, vomiting, vaginal discharge Hx of recurrent UTI? No She has a history of interstitial cystitis for which she takes Pyridium for.  This historically helps but the over-the-counter version does not.  She does not have any currently. Denies new sexual partners.  Past Medical History:  Diagnosis Date   Allergy    Alopecia    Anxiety and depression 07/31/2011   Chest pain 09/22/10   Sharp pains in left breast & sometimes in the right breast as well. Random in occurence & nonexertional. Also has occasional SOB & DOE when going up stairs & occasionally when working out. Nuclear Stress Treadmill 09/29/10 -    HSV-2 (herpes simplex virus 2) infection    IBS (irritable bowel syndrome)    Interstitial cystitis    Multiple allergies 07/02/2011   Neuromuscular disorder ([place])    past hx dx of Fibromyalgia   Otitis media 03/16/2012   Perimenopausal 07/22/2013   PONV (postoperative nausea and vomiting)    Poor concentration 07/02/2011   Shoulder pain, left 04/11/2012   SOB (shortness of breath) 09/22/10   ECHO 09/29/10 - NL LVF, trivial PE, trivial TR/PR.   UTI (lower urinary tract infection) 08/28/2011     BP 110/60 (BP Location: Right Arm, Patient Position: Sitting, Cuff Size: Normal)   Pulse 76   Temp 98.2 F (36.8 C) (Oral)   Resp 18   Ht '5\' 6"'$  (1.676 m)   Wt 128 lb 6.4 oz (58.2 kg)   LMP 07/03/2015   SpO2 97%   BMI 20.72 kg/m  General: Awake, alert, appears stated age Heart: RRR Lungs: CTAB, normal respiratory effort, no accessory muscle usage Abd: BS+, soft, TTP over the suprapubic region, ND, no masses or organomegaly MSK: No CVA tenderness, neg Lloyd's sign Psych: Age appropriate judgment and insight  Interstitial  cystitis - Plan: POCT Urinalysis Dipstick (Automated), Urine Culture, Ambulatory referral to Physical Therapy, phenazopyridine (PYRIDIUM) 200 MG tablet  Flu-like symptoms - Plan: POC COVID-19  Exacerbation of chronic issue.  We will get Pyridium 200 mg 3 times daily sent back in as this has systolically worked well for her.  Stay hydrated and avoid irritating/triggering foods.  Urinalysis unremarkable, will check for culture for thoroughness.  We will get her set up with pelvic floor physical therapy again as this has been helpful for historically. She was exposed to [place] possibly through her fianc.  The exposure was tentatively 5 days ago so we will check a COVID test today. F/u prn. The patient voiced understanding and agreement to the plan.  [place], DO 07/23/22 3:07 PM

## 2022-07-23 NOTE — Patient Instructions (Signed)
Stay hydrated.   Warning signs/symptoms: Uncontrollable nausea/vomiting, fevers, worsening symptoms despite treatment, confusion.  Give [place] around 2 business days to get culture back to you.  If you are having issues with the   Let us know if you need anything.

## 2022-07-24 LAB — URINE CULTURE
MICRO NUMBER:: 13955949
Result:: NO GROWTH
SPECIMEN QUALITY:: ADEQUATE

## 2022-07-28 ENCOUNTER — Ambulatory Visit: Payer: Commercial Managed Care - HMO | Admitting: Adult Health

## 2022-07-28 ENCOUNTER — Encounter: Payer: Self-pay | Admitting: Adult Health

## 2022-07-28 DIAGNOSIS — R053 Chronic cough: Secondary | ICD-10-CM | POA: Diagnosis not present

## 2022-07-28 DIAGNOSIS — R918 Other nonspecific abnormal finding of lung field: Secondary | ICD-10-CM

## 2022-07-28 MED ORDER — BENZONATATE 200 MG PO CAPS: 200.0000 mg | ORAL_CAPSULE | Freq: Three times a day (TID) | ORAL | 1 refills | Status: AC | PRN

## 2022-07-28 NOTE — Patient Instructions (Signed)
Continue Allegra '180mg'$  in am As needed   Continue on Chlorpheniramine '4mg'$  At bedtime As needed  drainage  (over the counter )  Deslym 2 tsp Twice daily  for cough As needed  (over the counter)  Tessalon Three times a day  for cough as needed  Pepcid '20mg'$  At bedtime As needed   Albuterol Inhaler 1-2 puffs every 6hr as needed for wheezing /shortness of breath .  CT chest 01/2023 for lung nodules  Follow up with Dr. Loanne Drilling or Odelia Graciano NP in 6 month and As needed  Please contact office for sooner follow up if symptoms do not improve or worsen or seek emergency care

## 2022-07-28 NOTE — Progress Notes (Signed)
_0  ID: [name], female    DOB: [birth year], 51 y.o.   MRN: [id number]  Chief Complaint  Patient presents with   Follow-up    Referring provider: Mosie Lukes, MD  HPI: 51 year old female never smoker seen for pulmonary consult July 2022 for chronic cough x1 year Patient is a TV anchor, body language expert  TEST/EVENTS : Chest x-ray January 05, 2021 showed clear lungs.     01/05/21 Pulmonary function testing that showed normal lung function with no airflow obstruction or restriction and a normal diffusing capacity.  FEV1 122%, ratio 90, FVC 107%.,  No significant bronchodilator response, DLCO 112%.   Cough workup :   No known occupational exposures.  Does travel domestically.  Denies basement or hot tub.  Does not smoke.  No birds or chickens.   Allergen panel July 22, 2021 negative, IgE 17, absolute eosinophils 0, BNP 7  01/2022 HRCT neg for ILD , scattered nodules <4 mm > CT chest in 1year .   07/28/2022 Follow up ; Chronic Cough , Lung nodules  Patient presents for a 5-monthfollow-up.  Patient has been followed for chronic cough over the last 2 years.  Work-up has been unrevealing with normal pulmonary function testing.  Allergy panel was negative.  IgE was low at 17.  Absolute eosinophil count was 0.  BNP was normal.  High-resolution CT chest was negative for interstitial lung disease.  She is a never smoker.  There were scattered pulmonary nodules measuring 4 mm or less.  She was referred to ENT with evaluation with laryngoscopy showing a normal vocal cords and vocal cord mobility.  Mild edema noted.  She was treated for upper airway cough syndrome with traditional triggers of postnasal drainage and reflux.  She was recommended to take Allegra, chlor tabs, Pepcid, Delsym, Tessalon for cough control.  She was referred to allergist -workup was un-revealing .  Patient says since last visit she is doing very well.  She says her cough is totally resolved  currently.  She is currently off of all the medications we are using for cough control and prevention.  She is feeling great and is becoming more active and is exercising .   Patient is very active.  She works full-time.  Has 2 children.  1 child is [facility]  And 1 is at    No Known Allergies  Immunization History  Administered Date(s) Administered   Hepatitis A 10/21/1998, 03/07/2010   Hepatitis B 07/22/1998, 09/08/1998, 01/19/1999   Influenza Inj Mdck Quad Pf 07/29/2019   Influenza Split 07/31/2011   Influenza,inj,Quad PF,6+ Mos 07/20/2013, 07/05/2014, 07/16/2015, 06/24/2016, 07/25/2017, 09/08/2018, 08/17/2020, 07/22/2021   Influenza-Unspecified 03/31/2010   MMR 03/14/2018   PFIZER(Purple Top)SARS-COV-2 Vaccination 02/03/2020, 03/02/2020, 10/02/2020   Td 07/06/2005   Tdap 07/06/2005, 09/07/2013   Typhoid Inactivated 03/31/2010   Zoster Recombinat (Shingrix) 04/23/2022    Past Medical History:  Diagnosis Date   Allergy    Alopecia    Anxiety and depression 07/31/2011   Chest pain 09/22/10   Sharp pains in left breast & sometimes in the right breast as well. Random in occurence & nonexertional. Also has occasional SOB & DOE when going up stairs & occasionally when working out. Nuclear Stress Treadmill 09/29/10 -    HSV-2 (herpes simplex virus 2) infection    IBS (irritable bowel syndrome)    Interstitial cystitis    Multiple allergies 07/02/2011   Neuromuscular disorder ([place]    past hx dx  of Fibromyalgia   Otitis media 03/16/2012   Perimenopausal 07/22/2013   PONV (postoperative nausea and vomiting)    Poor concentration 07/02/2011   Shoulder pain, left 04/11/2012   SOB (shortness of breath) 09/22/10   ECHO 09/29/10 - NL LVF, trivial PE, trivial TR/PR.   UTI (lower urinary tract infection) 08/28/2011    Tobacco History: Social History   Tobacco Use  Smoking Status Never  Smokeless Tobacco Never   Counseling given: Not Answered   Outpatient  Medications Prior to Visit  Medication Sig Dispense Refill   amphetamine-dextroamphetamine (ADDERALL) 20 MG tablet Take 1 tablet (20 mg total) by mouth 2 (two) times daily. 60 tablet 0   estradiol (ESTRACE) 0.5 MG tablet Take 1 tablet (0.5 mg total) by mouth daily. Take one tablet (0.5 mg) by mouth daily. 90 tablet 4   Estradiol (VAGIFEM) 10 MCG TABS vaginal tablet Place 1 tablet (10 mcg total) vaginally 2 (two) times a week. Use every night before bed for two weeks when you first begin this medicine, then after the first two weeks, begin using it twice a week. 34 tablet 3   fexofenadine (ALLEGRA) 180 MG tablet TAKE 1 TABLET BY MOUTH EVERY DAY 90 tablet 1   finasteride (PROSCAR) 5 MG tablet TAKE 1 TABLET BY MOUTH EVERY DAY 90 tablet 0   flunisolide (NASALIDE) 25 MCG/ACT (0.025%) SOLN Place 2 sprays into the nose 2 (two) times daily as needed. USE AS DIRECTED (Patient not taking: Reported on 07/23/2022) 75 mL 3   hydrOXYzine (ATARAX/VISTARIL) 10 MG tablet Take 1-2 tablets (10-20 mg total) by mouth every 8 (eight) hours as needed (pain, insomnia). 90 tablet 1   meloxicam (MOBIC) 7.5 MG tablet Take 1 tablet (7.5 mg total) by mouth daily. (Patient not taking: Reported on 07/23/2022) 14 tablet 0   Methen-Hyosc-Meth Blue-Na Phos (ME/NAPHOS/MB/HYO1) 81.6 MG TABS Take 1 tablet by mouth every 6 (six) hours as needed (pain/buring). 60 tablet 2   phenazopyridine (PYRIDIUM) 200 MG tablet Take 1 tablet (200 mg total) by mouth 3 (three) times daily as needed for pain. 30 tablet 2   progesterone (PROMETRIUM) 100 MG capsule Take 1 capsule (100 mg total) by mouth at bedtime. 90 capsule 4   Spacer/Aero-Holding Chambers (AEROCHAMBER MINI CHAMBER) DEVI Use as needed with albuterol HFA 1 each 0   traMADol (ULTRAM) 50 MG tablet Take 1 tablet (50 mg total) by mouth every 6 (six) hours as needed. 15 tablet 0   tretinoin (RETIN-A) 0.05 % cream Apply topically.     UNABLE TO FIND cocovia     valACYclovir (VALTREX) 500 MG  tablet Take 1 tablet (500 mg total) by mouth daily. Take daily for prevention.  Take 1 tablet by mouth twice daily for 3 days for an outbreak. 110 tablet 3   VALIUM 10 MG tablet 10 mg at bedtime.     XIIDRA 5 % SOLN      albuterol (VENTOLIN HFA) 108 (90 Base) MCG/ACT inhaler Inhale 1-2 puffs into the lungs every 6 (six) hours as needed for wheezing or shortness of breath. (Patient not taking: Reported on 07/23/2022) 8 g 1   benzonatate (TESSALON) 200 MG capsule Take 1 capsule (200 mg total) by mouth 3 (three) times daily as needed for cough. 45 capsule 5   famotidine (PEPCID) 40 MG tablet Take 1 tablet (40 mg total) by mouth at bedtime. 90 tablet 1   fluconazole (DIFLUCAN) 150 MG tablet Take 1 tablet as needed for yeast infection, may repeat in  3 days as needed. (Patient not taking: Reported on 07/23/2022) 2 tablet 0   No facility-administered medications prior to visit.     Review of Systems:   Constitutional:   No  weight loss, night sweats,  Fevers, chills, fatigue, or  lassitude.  HEENT:   No headaches,  Difficulty swallowing,  Tooth/dental problems, or  Sore throat,                No sneezing, itching, ear ache, nasal congestion, post nasal drip,   CV:  No chest pain,  Orthopnea, PND, swelling in lower extremities, anasarca, dizziness, palpitations, syncope.   GI  No heartburn, indigestion, abdominal pain, nausea, vomiting, diarrhea, change in bowel habits, loss of appetite, bloody stools.   Resp: No shortness of breath with exertion or at rest.  No excess mucus, no productive cough,  No non-productive cough,  No coughing up of blood.  No change in color of mucus.  No wheezing.  No chest wall deformity  Skin: no rash or lesions.  GU: no dysuria, change in color of urine, no urgency or frequency.  No flank pain, no hematuria   MS:  No joint pain or swelling.  No decreased range of motion.  No back pain.    Physical Exam  LMP 07/03/2015   GEN: A/Ox3; pleasant , NAD, well  nourished    HEENT:  [place]/AT,  EACs-clear, TMs-wnl, NOSE-clear, THROAT-clear, no lesions, no postnasal drip or exudate noted.   NECK:  Supple w/ fair ROM; no JVD; normal carotid impulses w/o bruits; no thyromegaly or nodules palpated; no lymphadenopathy.    RESP  Clear  P & A; w/o, wheezes/ rales/ or rhonchi. no accessory muscle use, no dullness to percussion  CARD:  RRR, no m/r/g, no peripheral edema, pulses intact, no cyanosis or clubbing.  GI:   Soft & nt; nml bowel sounds; no organomegaly or masses detected.   Musco: Warm bil, no deformities or joint swelling noted.   Neuro: alert, no focal deficits noted.    Skin: Warm, no lesions or rashes    Lab Results:    BNP No results found for: "BNP"  ProBNP   Imaging: No results found.       Latest Ref Rng & Units 06/08/2021   10:59 AM  PFT Results  FVC-Pre L 4.01   FVC-Predicted Pre % 107   FVC-Post L 3.83   FVC-Predicted Post % 102   Pre FEV1/FVC % % 90   Post FEV1/FCV % % 63   FEV1-Pre L 3.61   FEV1-Predicted Pre % 122   FEV1-Post L 2.43   DLCO uncorrected ml/min/mmHg 25.05   DLCO UNC% % 112   DLCO corrected ml/min/mmHg 25.05   DLCO COR %Predicted % 112   DLVA Predicted % 101   TLC L 6.68   TLC % Predicted % 124   RV % Predicted % 140     No results found for: "NITRICOXIDE"      Assessment & Plan:   No problem-specific Assessment & Plan notes found for this encounter.     Rexene Edison, NP 07/28/2022

## 2022-07-30 ENCOUNTER — Telehealth: Payer: Self-pay | Admitting: Family Medicine

## 2022-07-30 DIAGNOSIS — R918 Other nonspecific abnormal finding of lung field: Secondary | ICD-10-CM | POA: Insufficient documentation

## 2022-07-30 MED ORDER — MOLNUPIRAVIR EUA 200MG CAPSULE: 4.0000 | ORAL_CAPSULE | Freq: Two times a day (BID) | ORAL | 0 refills | Status: AC

## 2022-07-30 NOTE — Assessment & Plan Note (Signed)
High-resolution CT chest April 2023 showed very small scattered nodules.  Reviewed CT in detail with patient We will repeat CT chest in 1 year without contrast.

## 2022-07-30 NOTE — Telephone Encounter (Signed)
[facility] called indicating that pt just tested + for COVID using a home test.  Temp to 101.7, sore throat, cough, body aches.  She is asking for antiviral medication.  Since [facility] could not tell me what medications she was taking, I sent in [place] to pharmacy for her as this doesn't have any medication interactions.

## 2022-07-30 NOTE — Assessment & Plan Note (Signed)
Substantial improvement in cough.  Currently resolved.  Treatment was aimed at GERD and chronic rhinitis along with cough control regimen.  Patient remain off of medications for now.  Use as needed if cough returns  Plan  Patient Instructions  Continue Allegra '180mg'$  in am As needed   Continue on Chlorpheniramine '4mg'$  At bedtime As needed  drainage  (over the counter )  Deslym 2 tsp Twice daily  for cough As needed  (over the counter)  Tessalon Three times a day  for cough as needed  Pepcid '20mg'$  At bedtime As needed   Albuterol Inhaler 1-2 puffs every 6hr as needed for wheezing /shortness of breath .  CT chest 01/2023 for lung nodules  Follow up with Dr. Loanne Drilling or Shomari Scicchitano NP in 6 month and As needed  Please contact office for sooner follow up if symptoms do not improve or worsen or seek emergency care

## 2022-08-02 NOTE — Telephone Encounter (Signed)
Nurse Assessment Nurse: Lolita Lenz, RN, Carlee Date/Time Eilene Ghazi Time): 07/30/2022 5:41:42 PM Confirm and document reason for call. If symptomatic, describe symptoms. ---Caller states she tested positive for COVID with a home test. Symptoms started last night. She has a current fever of 101.7. Stuffy/runny nose, body aches, sore throat, cough. Denies any chest pain or SOB. Does the patient have any new or worsening symptoms? ---Yes Will a triage be completed? ---Yes Related visit to physician within the last 2 weeks? ---No Does the PT have any chronic conditions? (i.e. diabetes, asthma, this includes High risk factors for pregnancy, etc.) ---No Is the patient pregnant or possibly pregnant? (Ask all females between the ages of 45-55) ---No Is this a behavioral health or substance abuse call? ---No Guidelines Guideline Title Affirmed Question Affirmed Notes Nurse Date/Time (Eastern Time) COVID-19 - Diagnosed or Suspected [1] HIGH RISK patient (e.g., weak immune system, age > 73 years, obesity with BMI 30 or higher, pregnant, chronic lung disease or other chronic medical condition) Lolita Lenz, RN, Carlee 07/30/2022 5:43:14 PM PLEASE NOTE: All timestamps contained within this report are represented as [place] Standard Time. CONFIDENTIALTY NOTICE: This fax transmission is intended only for the addressee. It contains information that is legally privileged, confidential or otherwise protected from use or disclosure. If you are not the intended recipient, you are strictly prohibited from reviewing, disclosing, copying using or disseminating any of this information or taking any action in reliance on or regarding this information. If you have received this fax in error, please notify us immediately by telephone so that we can arrange for its return to [place]. Phone: [phone], Toll-Free: [phone], Fax: [phone] Page: 2 of 3 Call Id: [id number] Guidelines Guideline Title Affirmed  Question Affirmed Notes Nurse Date/Time Eilene Ghazi Time) AND [2] COVID symptoms (e.g., cough, fever) (Exceptions: Already seen by PCP and no new or worsening symptoms.) Disp. Time Eilene Ghazi Time) Disposition Final User 07/30/2022 5:49:37 PM Call PCP within 24 Hours Yes Alphonzo Severance 07/30/2022 5:50:13 PM Paged On Call back to Call [place], [place], [place] Final Disposition 07/30/2022 5:49:37 PM Call PCP within 24 Hours Yes Lolita Lenz, RN, [place] Caller Disagree/Comply Comply Caller Understands Yes PreDisposition Call Doctor Care Advice Given Per Guideline CALL PCP WITHIN 24 HOURS: CALL BACK IF: * You become worse CARE ADVICE given per COVID-19 - DIAGNOSED OR SUSPECTED (Adult) guideline. Comments User: Victorino Dike, RN Date/Time Eilene Ghazi Time): 07/30/2022 5:43:09 PM History: interstitial cystitis User: Victorino Dike, RN Date/Time Eilene Ghazi Time): 07/30/2022 5:48:42 PM [facility] [address] in [place]: Victorino Dike, RN Date/Time ([place] Time): 07/30/2022 6:02:41 PM Patient notified that MD will be sending in the medication to her preferred pharmacy, will call back for any new or worsning symptoms. Paging DoctorName Phone DateTime Result/ Outcome Message Type Notes Dimple Nanas- MD [phone] 07/30/2022 5:50:13 PM Paged On Call Back to Call Center Doctor Paged Please call Carlee, RN with [facility] at [phone] PLEASE NOTE: All timestamps contained within this report are represented as [place] Standard Time. CONFIDENTIALTY NOTICE: This fax transmission is intended only for the addressee. It contains information that is legally privileged, confidential or otherwise protected from use or disclosure. If you are not the intended recipient, you are strictly prohibited from reviewing, disclosing, copying using or disseminating any of this information or taking any action in reliance on or regarding this information. If you have received this fax in error,  please notify us immediately by telephone so that we can arrange for its return to [place]. Phone: [phone], Toll-Free:  [phone], Fax: [phone] Page: 3 of 3 Call Id: [id number] Paging DoctorName Phone DateTime Result/ Outcome Message Type Notes Dimple Nanas- MD 07/30/2022 6:02:14 PM Spoke with On Call - General Message Result MD states she will [facility]

## 2022-08-03 ENCOUNTER — Ambulatory Visit: Payer: Commercial Managed Care - HMO | Admitting: Physical Therapy

## 2022-08-11 ENCOUNTER — Other Ambulatory Visit: Payer: Self-pay

## 2022-08-11 ENCOUNTER — Ambulatory Visit: Payer: Commercial Managed Care - HMO | Attending: Family Medicine | Admitting: Physical Therapy

## 2022-08-11 DIAGNOSIS — R293 Abnormal posture: Secondary | ICD-10-CM | POA: Diagnosis not present

## 2022-08-11 DIAGNOSIS — R279 Unspecified lack of coordination: Secondary | ICD-10-CM | POA: Insufficient documentation

## 2022-08-11 DIAGNOSIS — M6281 Muscle weakness (generalized): Secondary | ICD-10-CM | POA: Diagnosis not present

## 2022-08-11 DIAGNOSIS — N3010 Interstitial cystitis (chronic) without hematuria: Secondary | ICD-10-CM | POA: Diagnosis present

## 2022-08-11 NOTE — Therapy (Addendum)
OUTPATIENT PHYSICAL THERAPY FEMALE PELVIC EVALUATION   Patient Name: [name] MRN: [id number] DOB:Jun 17, 1971, 51 y.o., female Today's Date: 08/11/2022   PT End of Session - 08/11/22 1530     Visit Number 1    Date for PT Re-Evaluation 12/12/22    Authorization Type [employer]    PT Start Time 1445    PT Stop Time 1529    PT Time Calculation (min) 44 min    Activity Tolerance Patient tolerated treatment well;No increased pain    Behavior During Therapy [facility] for tasks assessed/performed             Past Medical History:  Diagnosis Date   Allergy    Alopecia    Anxiety and depression 07/31/2011   Chest pain 09/22/10   Sharp pains in left breast & sometimes in the right breast as well. Random in occurence & nonexertional. Also has occasional SOB & DOE when going up stairs & occasionally when working out. Nuclear Stress Treadmill 09/29/10 -    HSV-2 (herpes simplex virus 2) infection    IBS (irritable bowel syndrome)    Interstitial cystitis    Multiple allergies 07/02/2011   Neuromuscular disorder ([place])    past hx dx of Fibromyalgia   Otitis media 03/16/2012   Perimenopausal 07/22/2013   PONV (postoperative nausea and vomiting)    Poor concentration 07/02/2011   Shoulder pain, left 04/11/2012   SOB (shortness of breath) 09/22/10   ECHO 09/29/10 - NL LVF, trivial PE, trivial TR/PR.   UTI (lower urinary tract infection) 08/28/2011   Past Surgical History:  Procedure Laterality Date   BREAST BIOPSY Right 03/12/2016   Procedure: breast exploration;  Surgeon: Jackolyn Confer, MD;  Location: [place];  Service: General;  Laterality: Right;   BREAST ENHANCEMENT SURGERY  2007   CESAREAN SECTION  2001 and 2004   x2   COLONOSCOPY  2007   LAPAROSCOPY     twice   RHINOPLASTY  2003   [place]   TUBAL LIGATION     UPPER GASTROINTESTINAL ENDOSCOPY  2011   Patient Active Problem List   Diagnosis Date Noted   Lung nodules 07/30/2022   Vaginal  pain 06/07/2022   Hypotension due to drugs 06/07/2022   Nonallergic rhinitis 09/16/2021   Chronic cough 12/22/2020   Vaginitis 09/04/2020   HSV infection 09/04/2020   Preventative health care 09/04/2020   Pelvic floor dysfunction 06/05/2020   Nausea 03/02/2020   Alopecia 06/27/2019   Hyperlipidemia 12/27/2018   Neck pain on right side 12/02/2014   Fatigue 01/27/2014   Perimenopausal 07/22/2013   Otitis media 03/16/2012   Palpitations 02/14/2012   Anxiety and depression 07/31/2011   Vitamin D deficiency 07/02/2011   Multiple allergies 07/02/2011   Interstitial cystitis    IBS (irritable bowel syndrome)    GERD (gastroesophageal reflux disease)    Premature atrial contractions    Adult ADHD     PCP: not listed  REFERRING PROVIDER: Shelda Pal, DO  REFERRING DIAG: N30.10 (ICD-10-CM) - Interstitial cystitis  THERAPY DIAG:  Unspecified lack of coordination  Interstitial cystitis  Muscle weakness (generalized)  Abnormal posture  Rationale for Evaluation and Treatment Rehabilitation  ONSET DATE: chronic   SUBJECTIVE:  SUBJECTIVE STATEMENT: Pt reports an IC episode in 8/23 and has not been able     PAIN:  Are you having pain? Yes NPRS scale: 3/10 Pain location:  bladder  Pain type: aching Pain description: constant   PRECAUTIONS: None  WEIGHT BEARING RESTRICTIONS No  FALLS:  Has patient fallen in last 6 months? No  LIVING ENVIRONMENT: Lives with: lives with their family PLOF: Independent  PATIENT GOALS to have less pain   PERTINENT HISTORY:  Alopecia,Anxiety and depression, HSV-2 , IBS, Interstitial cystitis,  UTI  Sexual abuse: No  BOWEL MOVEMENT Pain with bowel movement: No Type of bowel movement:Type (Bristol Stool Scale) 4-6, Frequency 1-2x day, and  Strain No sometimes Fully empty rectum: No Leakage: No Pads: No Fiber supplement: No  URINATION Pain with urination: No Fully empty bladder: No Stream: Strong Urgency: Yes:   Frequency: 2 hours, 3-4x per night Leakage:  none  Pads: No  INTERCOURSE Pain with intercourse:  none   PREGNANCY Vaginal deliveries 0 Tearing No C-section deliveries 2 Currently pregnant No  PROLAPSE None    OBJECTIVE:   DIAGNOSTIC FINDINGS:    COGNITION:  Overall cognitive status: Within functional limits for tasks assessed     SENSATION:  Light touch: Appears intact  Proprioception: Appears intact               POSTURE: rounded shoulders   LUMBARAROM/PROM  WFL  LOWER EXTREMITY ROM:  WFL  LOWER EXTREMITY MMT:  Hips WFL  PALPATION:   General  mild TTP and fascial restrictions over bladder                 External Perineal Exam [facility]                             Internal Pelvic Floor mild TTP at anterior pelvic floor   Patient confirms identification and approves PT to assess internal pelvic floor and treatment Yes  PELVIC MMT:   MMT eval  Vaginal 5/5; 8s; 10 reps  Internal Anal Sphincter   External Anal Sphincter   Puborectalis   Diastasis Recti   (Blank rows = not tested)        TONE: WFL  PROLAPSE: Not seen in hooklying   TODAY'S TREATMENT  EVAL Examination completed, findings reviewed, pt educated on POC, HEP, and IC guidelines handout, feminine moisturizer and lubricants, and to ask MD about recommendations for medication or supplement for IC pain including aloe supplements as pt asked about these. Pt educated PT's unable to make medication recommendations and all supplement/medications should be through MD. Pt agreed. Pt motivated to participate in PT and agreeable to attempt recommendations.     PATIENT EDUCATION:  Education details: pt has stretching HEP she uses from previous PFPT, educated on IC with handout given, relaxation techniques and pelvic wand  use Person educated: Patient Education method: Explanation, Demonstration, Tactile cues, Verbal cues, and Handouts Education comprehension: verbalized understanding and returned demonstration   ASSESSMENT:  CLINICAL IMPRESSION: Patient is a 51 y.o. female  who was seen today for physical therapy evaluation and treatment for IC pain/flare since Aug 2023. Pt has had some improvement since august but has not resolved. Pt found to have one area of tension at anterior pelvic floor and educated during assessment how to use her pelvic wand here gently for tissue mobility pt agreed. Pt tolerated well and had no other areas of pain or tension in pelvic floor, slightly more  tight in posterior pelvic floor but not symptomatic. Pt educated on relaxation techniques, breathing mechanics, voiding mechanics, HEP, pelvic wand use and to ask MD about her questions with supplements/medications for IC pain/flare. Pt also educated on feminine moisturizers and lubricants for skin integrity.  Pt would benefit from additional PT to further address deficits.     OBJECTIVE IMPAIRMENTS decreased coordination, increased fascial restrictions, increased muscle spasms, improper body mechanics, and pain.   ACTIVITY LIMITATIONS  not limited in activity but does have constant pain  PARTICIPATION LIMITATIONS: interpersonal relationship and community activity  PERSONAL FACTORS Time since onset of injury/illness/exacerbation are also affecting patient's functional outcome.   REHAB POTENTIAL: Good  CLINICAL DECISION MAKING: Stable/uncomplicated  EVALUATION COMPLEXITY: Low   GOALS: Goals reviewed with patient? Yes  SHORT TERM GOALS: Target date: 09/08/2022  Pt to be I with HEP.  Baseline: Goal status: INITIAL   LONG TERM GOALS: Target date:  12/12/22    Pt to be I with HEP.  Baseline:  Goal status: INITIAL  2.  Pt will report 25% reduction of pain due to improvements in posture, strength, and muscle length   Baseline:  Goal status: INITIAL  3.  Pt will report her BMs and urine voids are complete due to improved voiding habits and evacuation techniques.  Baseline:  Goal status: INITIAL  4.  Pt to be I with breathing and relaxation techniques for improved pain management.  Baseline:  Goal status: INITIAL   PLAN: PT FREQUENCY: every other week  PT DURATION:  6 sessions  PLANNED INTERVENTIONS: Therapeutic exercises, Therapeutic activity, Neuromuscular re-education, Patient/Family education, Self Care, Joint mobilization, Aquatic Therapy, Dry Needling, Spinal mobilization, Cryotherapy, Moist heat, Taping, Biofeedback, and Manual therapy  PLAN FOR NEXT SESSION: relaxation techniques, external and internal manual work  Stacy Gardner, PT, DPT 10/11/235:17 PM   PHYSICAL THERAPY DISCHARGE SUMMARY  Visits from Start of Care: 1  Current functional level related to goals / functional outcomes: Unable to formally reassess as pt unable to return since last visit and has insurance change requesting DC    Remaining deficits: Unable to formally reassess    Education / Equipment: HEP   Patient agrees to discharge. Patient goals were not met. Patient is being discharged due to financial reasons.  Stacy Gardner, PT, DPT 10/19/2309:42 AM

## 2022-09-26 IMAGING — DX DG CHEST 2V
2 series · 2 of 2 positions shown · non-contrast
Comparison: None.

CLINICAL DATA: Chronic cough over the last 2 years.

EXAM:
CHEST - 2 VIEW

[chest pa]
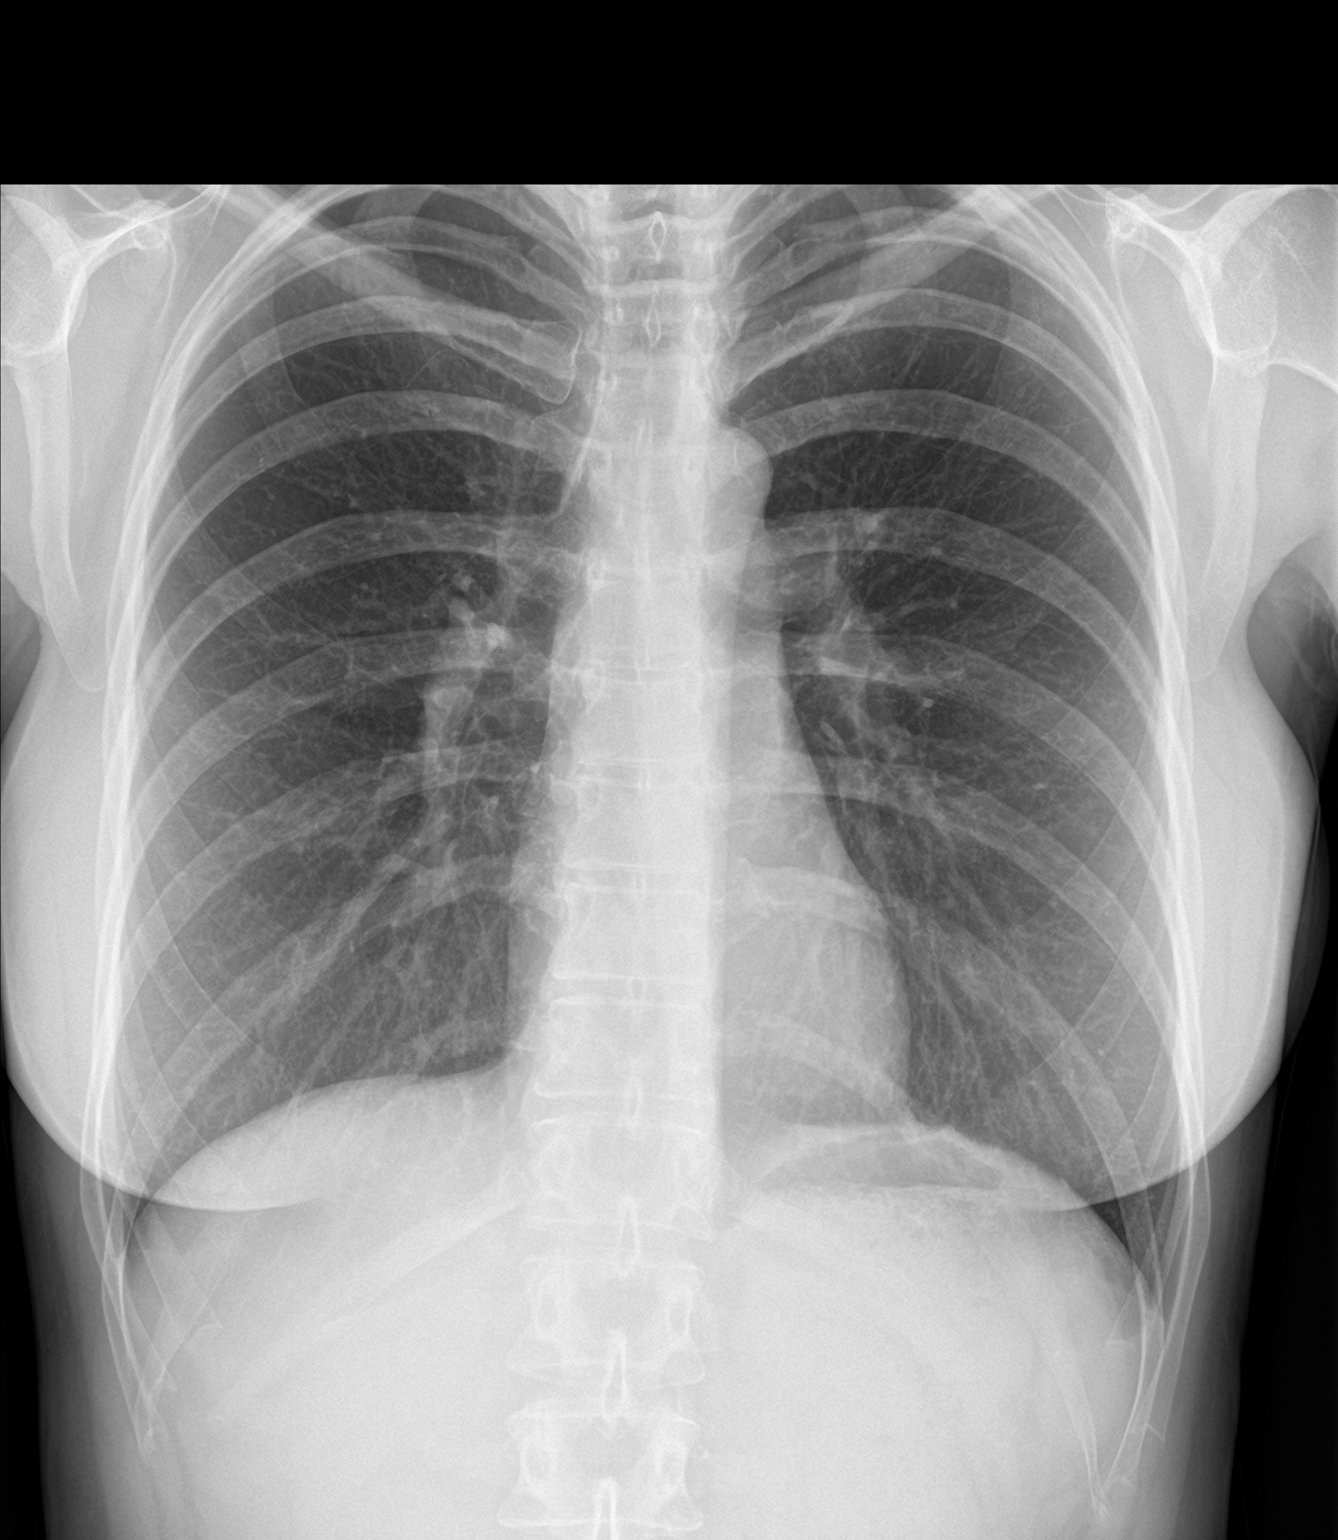

[chest lat]
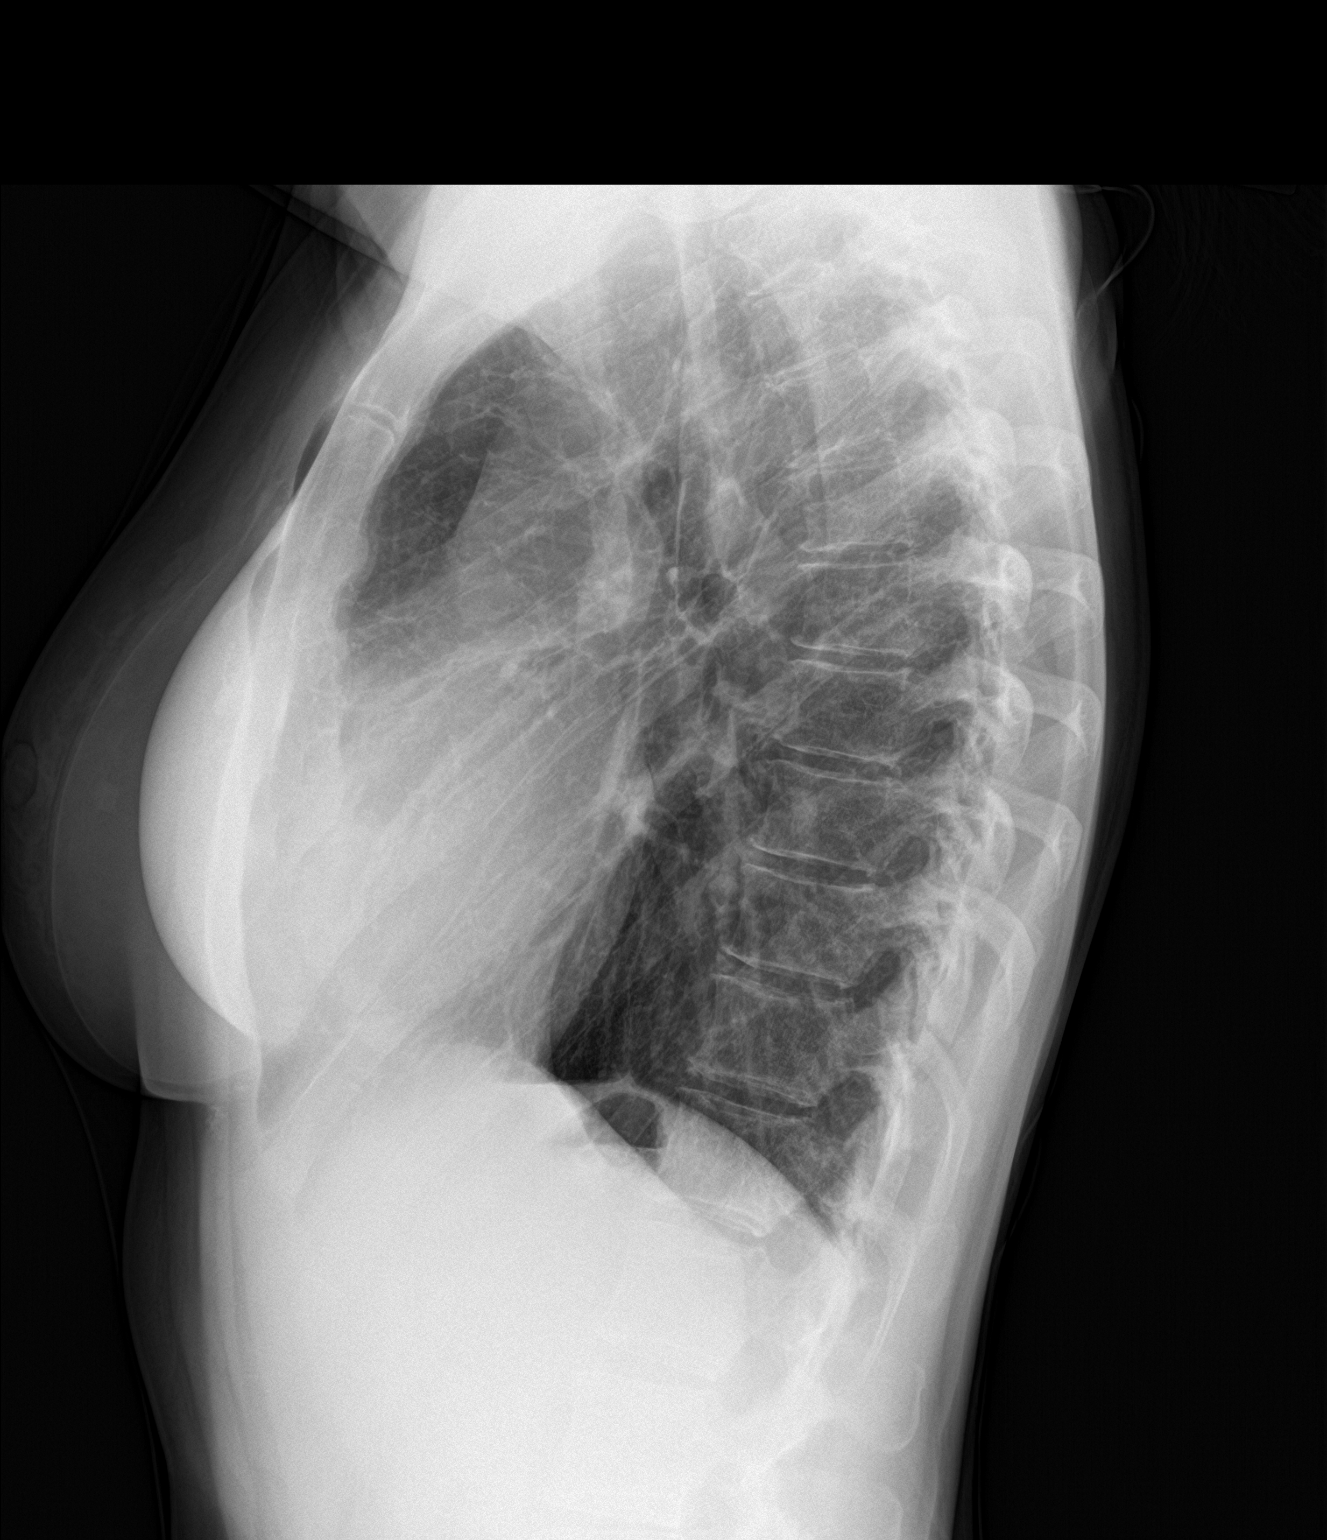

[2 of 2 positions shown; findings below may reference images not displayed]

FINDINGS: Heart size is normal. Mediastinal shadows are normal. The lungs are
clear. No effusions. No abnormal bone findings.
IMPRESSION: No active cardiopulmonary disease.

## 2022-10-06 ENCOUNTER — Ambulatory Visit: Payer: Commercial Managed Care - HMO | Admitting: Physical Therapy

## 2022-10-15 ENCOUNTER — Encounter: Payer: Commercial Managed Care - HMO | Admitting: Family

## 2022-10-19 ENCOUNTER — Other Ambulatory Visit: Payer: Self-pay

## 2022-10-19 ENCOUNTER — Other Ambulatory Visit: Payer: Self-pay | Admitting: Family

## 2022-10-19 ENCOUNTER — Ambulatory Visit (INDEPENDENT_AMBULATORY_CARE_PROVIDER_SITE_OTHER): Payer: Commercial Managed Care - HMO | Admitting: Family

## 2022-10-19 ENCOUNTER — Encounter: Payer: Self-pay | Admitting: Family

## 2022-10-19 ENCOUNTER — Ambulatory Visit (HOSPITAL_BASED_OUTPATIENT_CLINIC_OR_DEPARTMENT_OTHER)
Admission: RE | Admit: 2022-10-19 | Discharge: 2022-10-19 | Disposition: A | Discharge status: Home / Self Care | Payer: Commercial Managed Care - HMO | Source: Ambulatory Visit | Attending: Family | Admitting: Family

## 2022-10-19 VITALS — BP 98/50 | HR 64 | Temp 97.6°F | Resp 16 | Ht 65.7 in | Wt 128.0 lb

## 2022-10-19 DIAGNOSIS — R590 Localized enlarged lymph nodes: Secondary | ICD-10-CM | POA: Diagnosis present

## 2022-10-19 DIAGNOSIS — Z23 Encounter for immunization: Secondary | ICD-10-CM

## 2022-10-19 DIAGNOSIS — F909 Attention-deficit hyperactivity disorder, unspecified type: Secondary | ICD-10-CM

## 2022-10-19 DIAGNOSIS — R002 Palpitations: Secondary | ICD-10-CM

## 2022-10-19 DIAGNOSIS — Z0000 Encounter for general adult medical examination without abnormal findings: Secondary | ICD-10-CM

## 2022-10-19 DIAGNOSIS — Z0001 Encounter for general adult medical examination with abnormal findings: Secondary | ICD-10-CM | POA: Diagnosis not present

## 2022-10-19 DIAGNOSIS — B009 Herpesviral infection, unspecified: Secondary | ICD-10-CM | POA: Diagnosis not present

## 2022-10-19 LAB — LIPID PANEL
Cholesterol: 215 mg/dL — ABNORMAL HIGH (ref 0–200)
HDL: 56.7 mg/dL (ref >39.00)
LDL Cholesterol: 145 mg/dL — ABNORMAL HIGH (ref 0–99)
NonHDL: 158.45
Total CHOL/HDL Ratio: 4
Triglycerides: 69 mg/dL (ref 0.0–149.0)
VLDL: 13.8 mg/dL (ref 0.0–40.0)

## 2022-10-19 LAB — COMPREHENSIVE METABOLIC PANEL
ALT: 10 U/L (ref 0–35)
AST: 12 U/L (ref 0–37)
Albumin: 4.7 g/dL (ref 3.5–5.2)
Alkaline Phosphatase: 51 U/L (ref 39–117)
BUN: 17 mg/dL (ref 6–23)
CO2: 29 mEq/L (ref 19–32)
Calcium: 9.6 mg/dL (ref 8.4–10.5)
Chloride: 104 mEq/L (ref 96–112)
Creatinine, Ser: 0.7 mg/dL (ref 0.40–1.20)
GFR: 98.42 mL/min (ref >60.00)
Glucose, Bld: 96 mg/dL (ref 70–99)
Potassium: 3.9 mEq/L (ref 3.5–5.1)
Sodium: 139 mEq/L (ref 135–145)
Total Bilirubin: 1 mg/dL (ref 0.2–1.2)
Total Protein: 6.8 g/dL (ref 6.0–8.3)

## 2022-10-19 LAB — CBC WITH DIFFERENTIAL/PLATELET
Basophils Absolute: 0 10*3/uL (ref 0.0–0.1)
Basophils Relative: 0.5 % (ref 0.0–3.0)
Eosinophils Absolute: 0.1 10*3/uL (ref 0.0–0.7)
Eosinophils Relative: 1.2 % (ref 0.0–5.0)
HCT: 41.8 % (ref 36.0–46.0)
Hemoglobin: 14 g/dL (ref 12.0–15.0)
Lymphocytes Relative: 36.8 % (ref 12.0–46.0)
Lymphs Abs: 1.9 10*3/uL (ref 0.7–4.0)
MCHC: 33.5 g/dL (ref 30.0–36.0)
MCV: 86.8 fl (ref 78.0–100.0)
Monocytes Absolute: 0.4 10*3/uL (ref 0.1–1.0)
Monocytes Relative: 7.3 % (ref 3.0–12.0)
Neutro Abs: 2.8 10*3/uL (ref 1.4–7.7)
Neutrophils Relative %: 54.2 % (ref 43.0–77.0)
Platelets: 204 10*3/uL (ref 150.0–400.0)
RBC: 4.81 Mil/uL (ref 3.87–5.11)
RDW: 13 % (ref 11.5–15.5)
WBC: 5.1 10*3/uL (ref 4.0–10.5)

## 2022-10-19 LAB — TSH: TSH: 1.31 u[IU]/mL (ref 0.35–5.50)

## 2022-10-19 MED ORDER — VALACYCLOVIR HCL 500 MG PO TABS: ORAL_TABLET | ORAL | 1 refills | Status: DC

## 2022-10-19 MED ORDER — VALACYCLOVIR HCL 500 MG PO TABS: ORAL_TABLET | ORAL | 1 refills | Status: AC

## 2022-10-19 MED ORDER — AMPHETAMINE-DEXTROAMPHETAMINE 20 MG PO TABS: 20.0000 mg | ORAL_TABLET | Freq: Two times a day (BID) | ORAL | 0 refills | Status: AC

## 2022-10-19 NOTE — Assessment & Plan Note (Signed)
Stable with daily valtrex. Continue same.

## 2022-10-19 NOTE — Progress Notes (Addendum)
Subjective:   By signing my name below, I, Carylon Perches, attest that this documentation has been prepared under the direction and in the presence of Karie Chimera, NP 10/19/2022   Patient ID: [name], female    DOB: May 28, 1971, 51 y.o.   MRN: [id number]  Chief Complaint  Patient presents with   Annual Exam    HPI Patient is in today for a comprehensive physical exam.   Refill: She is requesting a refill of 20 mg of Adderall and 500 mg of Valtrex.   Constipation: She reports of some constipation but states that symptoms are most likely due to insufficient water intake  Palpitations-  She also notes of abnormal heart rhythm that occurred a few years ago. She was seen by Dr.McLean on 04/10/2012 for symptoms and was reported to have PACs. She states that the last couple of months, her abnormal heart rhythms have been daily.   Skin Moles: She states that she undergoes a skin check by her dermatologist regularly  Focus: She reports that her focus is improving since starting her 20 mg of Adderall medication PRN.  She denies having any fever, joint pain , new moles, rashes, sinus pain, sore throat, palpations, cough, SOB ,wheezing,n/v/d constipation, blood in stool, dysuria, frequency, hematuria, depression, anxiety, headaches at this time  Social history: She reports that she is no longer drinking alcohol.  Colonoscopy: Last completed on 04/04/2020. She is due for a colonoscopy in 2028. Dexa: Last completed on 06/02/2022 Pap Smear: Last completed on 03/18/2022. She reports that it has been two years in a row that her pap smear has been abnormal.  Mammogram: Last completed on 01/14/2022 Immunizations: She is interested in receiving an influenza vaccine during today's visit. She reports that she has received both doses of the Shingles vaccine, the first one on 04/09/2021 and 06/23/023.  Diet: She is maintaining a healthy diet.  Exercise: She is regularly exercising  but states that she could do better.  Dental: She is UTD on dental exams.  Vision: She is UTD on vision exams.   Health Maintenance Due  Topic Date Due   COVID-19 Vaccine (4 - 2023-24 season) 07/02/2022    Past Medical History:  Diagnosis Date   Allergy    Alopecia    Anxiety and depression 07/31/2011   Chest pain 09/22/10   Sharp pains in left breast & sometimes in the right breast as well. Random in occurence & nonexertional. Also has occasional SOB & DOE when going up stairs & occasionally when working out. Nuclear Stress Treadmill 09/29/10 -    HSV-2 (herpes simplex virus 2) infection    IBS (irritable bowel syndrome)    Interstitial cystitis    Multiple allergies 07/02/2011   Neuromuscular disorder ([place])    past hx dx of Fibromyalgia   Otitis media 03/16/2012   Perimenopausal 07/22/2013   PONV (postoperative nausea and vomiting)    Poor concentration 07/02/2011   Shoulder pain, left 04/11/2012   SOB (shortness of breath) 09/22/10   ECHO 09/29/10 - NL LVF, trivial PE, trivial TR/PR.   UTI (lower urinary tract infection) 08/28/2011    Past Surgical History:  Procedure Laterality Date   BREAST BIOPSY Right 03/12/2016   Procedure: breast exploration;  Surgeon: Jackolyn Confer, MD;  Location: [place] [facility];  Service: General;  Laterality: Right;   BREAST ENHANCEMENT SURGERY  2007   CESAREAN SECTION  2001 and 2004   x2   COLONOSCOPY  2007   LAPAROSCOPY  twice   RHINOPLASTY  2003   TONSILLECTOMY  1982   TUBAL LIGATION     UPPER GASTROINTESTINAL ENDOSCOPY  2011    Family History  Problem Relation Age of Onset   Heart disease Mother        MVP   Anxiety disorder Mother    Mitral valve prolapse Mother    Alzheimer's disease Mother    Colon polyps Mother    Hypertension Brother    Obesity Brother    Diabetes Brother        DM, obese   Allergies Daughter    Allergies Son    Kidney disease Maternal Grandmother        uremia   Breast cancer Neg Hx     Colon cancer Neg Hx    Esophageal cancer Neg Hx    Rectal cancer Neg Hx    Stomach cancer Neg Hx     Social History   Socioeconomic History   Marital status: Divorced    Spouse name: Not on file   Number of children: Not on file   Years of education: Not on file   Highest education level: Not on file  Occupational History   Not on file  Tobacco Use   Smoking status: Never   Smokeless tobacco: Never  Vaping Use   Vaping Use: Never used  Substance and Sexual Activity   Alcohol use: Not Currently    Comment: occasional   Drug use: Never   Sexual activity: Yes    Partners: Male    Birth control/protection: Surgical    Comment: Tubal lig-1st intercourse 51 yo-More than 5 partners  Other Topics Concern   Not on file  Social History Narrative   Not on file   Social Determinants of Health   Financial Resource Strain: Not on file  Food Insecurity: Not on file  Transportation Needs: Not on file  Physical Activity: Not on file  Stress: Not on file  Social Connections: Not on file  Intimate Partner Violence: Not on file    Outpatient Medications Prior to Visit  Medication Sig Dispense Refill   benzonatate (TESSALON) 200 MG capsule Take 1 capsule (200 mg total) by mouth 3 (three) times daily as needed. 45 capsule 1   estradiol (ESTRACE) 0.5 MG tablet Take 1 tablet (0.5 mg total) by mouth daily. Take one tablet (0.5 mg) by mouth daily. 90 tablet 4   Estradiol (VAGIFEM) 10 MCG TABS vaginal tablet Place 1 tablet (10 mcg total) vaginally 2 (two) times a week. Use every night before bed for two weeks when you first begin this medicine, then after the first two weeks, begin using it twice a week. 34 tablet 3   fexofenadine (ALLEGRA) 180 MG tablet TAKE 1 TABLET BY MOUTH EVERY DAY 90 tablet 1   finasteride (PROSCAR) 5 MG tablet TAKE 1 TABLET BY MOUTH EVERY DAY 90 tablet 0   hydrOXYzine (ATARAX/VISTARIL) 10 MG tablet Take 1-2 tablets (10-20 mg total) by mouth every 8 (eight) hours as  needed (pain, insomnia). 90 tablet 1   Methen-Hyosc-Meth Blue-Na Phos (ME/NAPHOS/MB/HYO1) 81.6 MG TABS Take 1 tablet by mouth every 6 (six) hours as needed (pain/buring). 60 tablet 2   phenazopyridine (PYRIDIUM) 200 MG tablet Take 1 tablet (200 mg total) by mouth 3 (three) times daily as needed for pain. 30 tablet 2   progesterone (PROMETRIUM) 100 MG capsule Take 1 capsule (100 mg total) by mouth at bedtime. 90 capsule 4   tretinoin (RETIN-A) 0.05 %  cream Apply topically.     UNABLE TO FIND cocovia     VALIUM 10 MG tablet 10 mg at bedtime.     XIIDRA 5 % SOLN      amphetamine-dextroamphetamine (ADDERALL) 20 MG tablet Take 1 tablet (20 mg total) by mouth 2 (two) times daily. 60 tablet 0   valACYclovir (VALTREX) 500 MG tablet Take 1 tablet (500 mg total) by mouth daily. Take daily for prevention.  Take 1 tablet by mouth twice daily for 3 days for an outbreak. 110 tablet 3   No facility-administered medications prior to visit.    No Known Allergies  Review of Systems  Constitutional:  Negative for fever.  HENT:  Negative for congestion, sinus pain and sore throat.   Respiratory:  Negative for cough, shortness of breath and wheezing.   Cardiovascular:  Negative for palpitations.  Gastrointestinal:  Positive for constipation. Negative for blood in stool, diarrhea, nausea and vomiting.  Genitourinary:  Negative for dysuria, frequency and hematuria.  Musculoskeletal:  Negative for joint pain and myalgias.  Skin:  Negative for rash.       (-) New Moles  Neurological:  Negative for headaches.  Psychiatric/Behavioral:  Negative for depression. The patient is not nervous/anxious.        Objective:    Physical Exam Constitutional:      General: She is not in acute distress.    Appearance: Normal appearance. She is not ill-appearing.  HENT:     Head: Normocephalic and atraumatic.     Right Ear: Tympanic membrane, ear canal and external ear normal.     Left Ear: Tympanic membrane, ear canal  and external ear normal.  Eyes:     Extraocular Movements: Extraocular movements intact.     Pupils: Pupils are equal, round, and reactive to light.  Cardiovascular:     Rate and Rhythm: Normal rate and regular rhythm.     Heart sounds: Normal heart sounds. No murmur heard.    No gallop.  Pulmonary:     Effort: Pulmonary effort is normal. No respiratory distress.     Breath sounds: Normal breath sounds. No wheezing or rales.  Abdominal:     General: Bowel sounds are normal. There is no distension.     Palpations: Abdomen is soft.     Tenderness: There is no abdominal tenderness. There is no guarding.  Musculoskeletal:     Comments: 5/5 strength in both upper and lower extremities    Lymphadenopathy:     Cervical: Cervical adenopathy present.  Skin:    General: Skin is warm and dry.  Neurological:     Mental Status: She is alert and oriented to person, place, and time.     Deep Tendon Reflexes:     Reflex Scores:      Patellar reflexes are 2+ on the right side and 2+ on the left side. Psychiatric:        Mood and Affect: Mood normal.        Behavior: Behavior normal.        Judgment: Judgment normal.     BP (!) 98/50 (BP Location: Right Arm, Patient Position: Sitting, Cuff Size: Small)   Pulse 64   Temp 97.6 F (36.4 C) (Oral)   Resp 16   Ht 5' 5.7" (1.669 m)   Wt 128 lb (58.1 kg)   LMP 07/03/2015   SpO2 100%   BMI 20.85 kg/m  Wt Readings from Last 3 Encounters:  10/19/22 128 lb (58.1 kg)  07/28/22 127 lb (57.6 kg)  07/23/22 128 lb 6.4 oz (58.2 kg)       Assessment & Plan:   Problem List Items Addressed This Visit       Unprioritized   Preventative health care    ASCUS pap is being managed by GYN with plan for 12 month recheck.  Mammo up to date. Colo up to date.  Flu shot today.       Relevant Orders   TSH   Comp Met (CMET)   CBC w/Diff   Lipid panel   Palpitations - Primary    She has had previous remote work up about 10 years ago, which noted  PAC's. Pt notes increased frequency of palpitations recently. Will check labs as below and also refer to cardiology.  EKG tracing is personally reviewed.  EKG notes NSR and LAFB  No acute changes. Compared to prior EKG on file LAFB is not new.       Relevant Orders   EKG 12-Lead (Completed)   TSH   Comp Met (CMET)   CBC w/Diff   Ambulatory referral to Cardiology   Cervical lymphadenopathy    New. Will obtain US for further evaluation.       Relevant Orders   US Soft Tissue Head/Neck (NON-THYROID)   Attention deficit hyperactivity disorder (ADHD)    Stable on current dose of adderall. Controlled substance contract is updated and UDS is ordered today.       Relevant Orders   DRUG MONITORING, PANEL 8 WITH CONFIRMATION, URINE   Other Visit Diagnoses     Needs flu shot       Relevant Orders   Flu Vaccine QUAD 6+ mos PF IM (Fluarix Quad PF) (Completed)      Meds ordered this encounter  Medications   amphetamine-dextroamphetamine (ADDERALL) 20 MG tablet    Sig: Take 1 tablet (20 mg total) by mouth 2 (two) times daily.    Dispense:  60 tablet    Refill:  0    Order Specific Question:   Supervising Provider    Answer:   Penni Homans A [[id number]]   DISCONTD: valACYclovir (VALTREX) 500 MG tablet    Sig: Take daily for prevention.    Dispense:  90 tablet    Refill:  1    Order Specific Question:   Supervising Provider    Answer:   Penni Homans A [[id number]]    I, Nance Pear, NP, personally preformed the services described in this documentation.  All medical record entries made by the scribe were at my direction and in my presence.  I have reviewed the chart and discharge instructions (if applicable) and agree that the record reflects my personal performance and is accurate and complete. 10/19/2022   I,Amber Collins,acting as a scribe for Nance Pear, NP.,have documented all relevant documentation on the behalf of Nance Pear, NP,as directed by  Nance Pear, NP while in the presence of Nance Pear, NP.    Nance Pear, NP

## 2022-10-19 NOTE — Assessment & Plan Note (Signed)
Stable on current dose of adderall. Controlled substance contract is updated and UDS is ordered today.

## 2022-10-19 NOTE — Assessment & Plan Note (Signed)
ASCUS pap is being managed by GYN with plan for 12 month recheck.  Mammo up to date. Colo up to date.  Flu shot today.

## 2022-10-19 NOTE — Assessment & Plan Note (Signed)
She has had previous remote work up about 10 years ago, which noted PAC's. Pt notes increased frequency of palpitations recently. Will check labs as below and also refer to cardiology.  EKG tracing is personally reviewed.  EKG notes NSR and LAFB  No acute changes. Compared to prior EKG on file LAFB is not new.

## 2022-10-19 NOTE — Patient Instructions (Signed)
d 

## 2022-10-19 NOTE — Assessment & Plan Note (Signed)
New. Will obtain US for further evaluation.

## 2022-10-20 ENCOUNTER — Ambulatory Visit: Payer: Commercial Managed Care - HMO | Admitting: Physical Therapy

## 2022-10-20 LAB — DM TEMPLATE

## 2022-10-20 LAB — DRUG MONITORING, PANEL 8 WITH CONFIRMATION, URINE
6 Acetylmorphine: NEGATIVE ng/mL (ref <10)
Alcohol Metabolites: NEGATIVE ng/mL (ref <500)
Amphetamines: NEGATIVE ng/mL (ref <500)
Benzodiazepines: NEGATIVE ng/mL (ref <100)
Buprenorphine, Urine: NEGATIVE ng/mL (ref <5)
Cocaine Metabolite: NEGATIVE ng/mL (ref <150)
Creatinine: 184.7 mg/dL (ref >=20.0)
MDMA: NEGATIVE ng/mL (ref <500)
Marijuana Metabolite: NEGATIVE ng/mL (ref <20)
Opiates: NEGATIVE ng/mL (ref <100)
Oxidant: NEGATIVE ug/mL (ref <200)
Oxycodone: NEGATIVE ng/mL (ref <100)
pH: 5.7 (ref 4.5–9.0)

## 2022-10-21 ENCOUNTER — Telehealth: Payer: Self-pay

## 2022-10-21 NOTE — Telephone Encounter (Signed)
Patient called and will like to get [place] results. She can be contacted via [place]

## 2023-01-02 IMAGING — US US THYROID
1 series · 12 of 25 positions shown · non-contrast
Comparison: No prior duplex

CLINICAL DATA: 50-year-old female with a history of nodule and
cough

EXAM:
THYROID ULTRASOUND
TECHNIQUE: Ultrasound examination of the thyroid gland and adjacent soft
tissues was performed.

[Series 1: us thyroid · 12 of 50 slices shown]
[im 3/50]
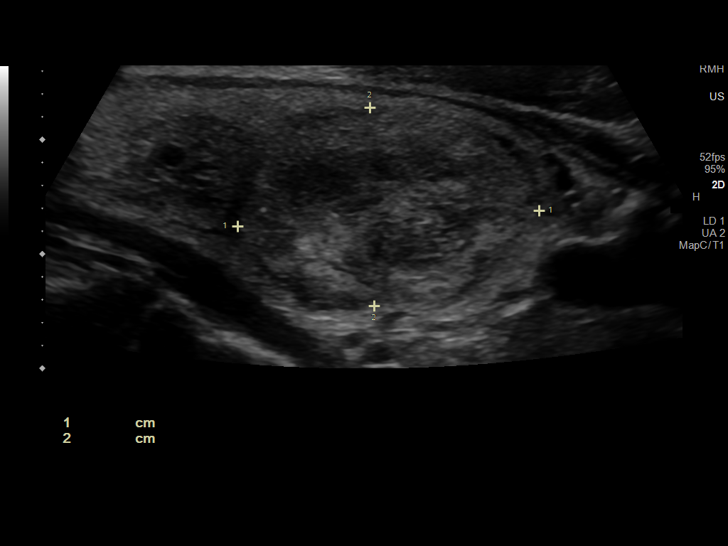
[im 7/50]
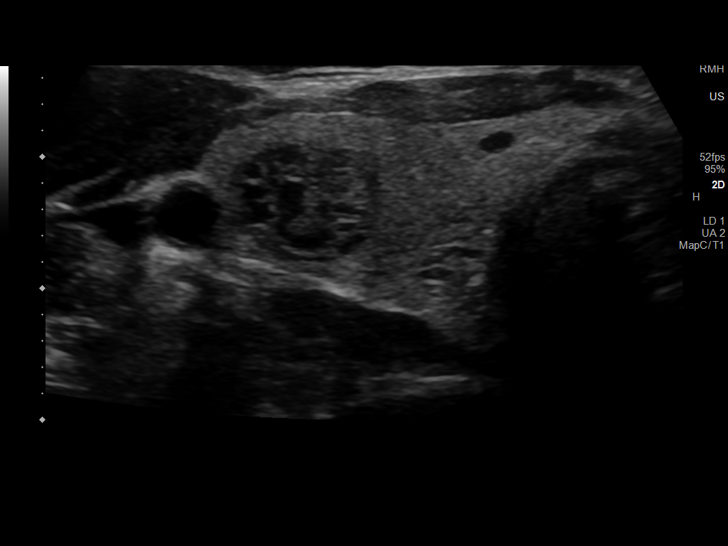
[im 11/50]
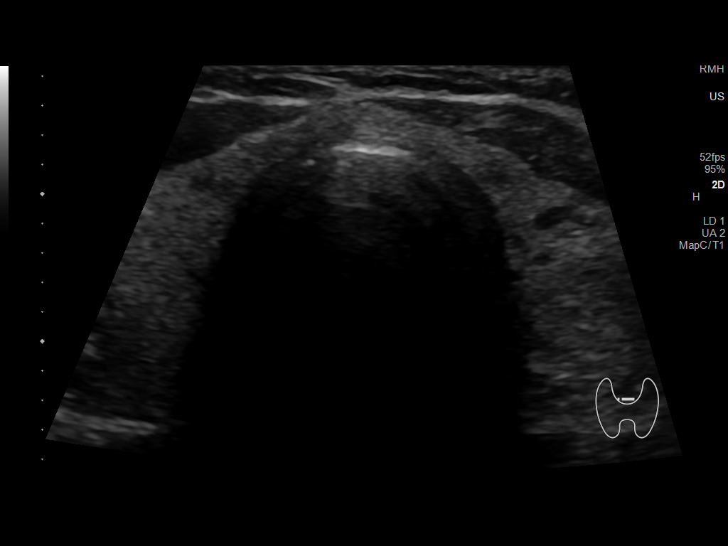
[im 15/50]
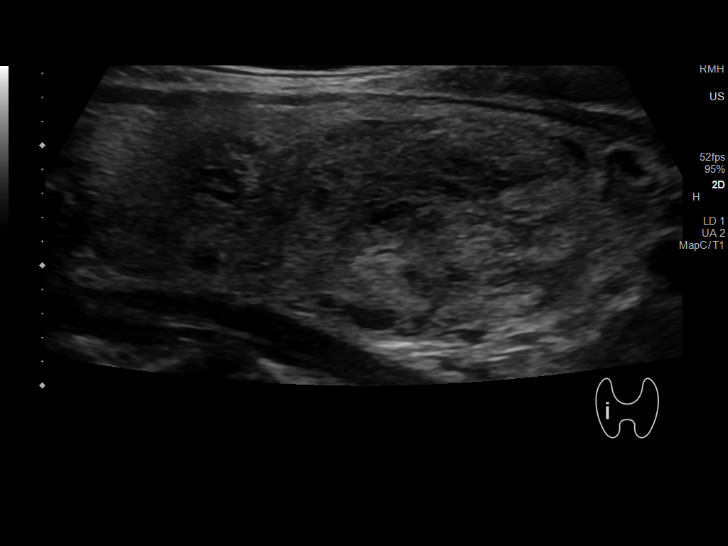
[im 19/50]
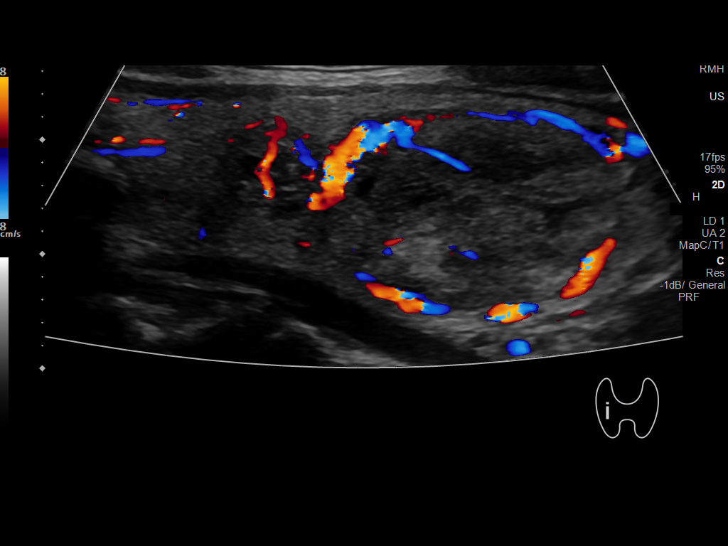
[im 23/50]
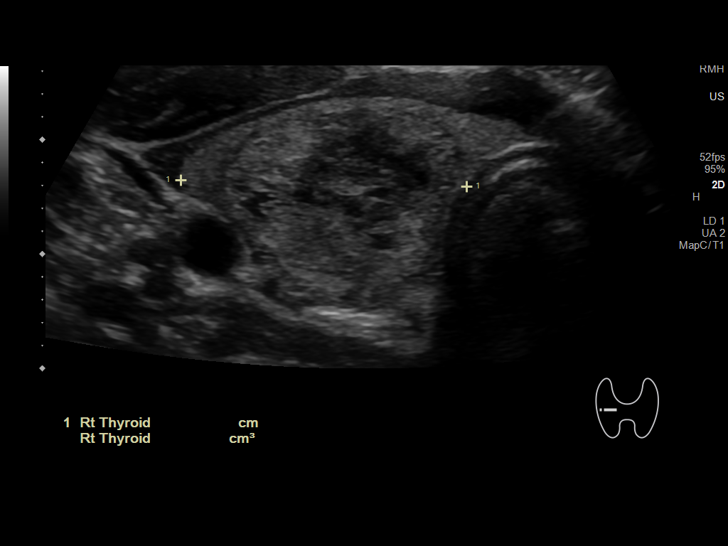
[im 27/50]
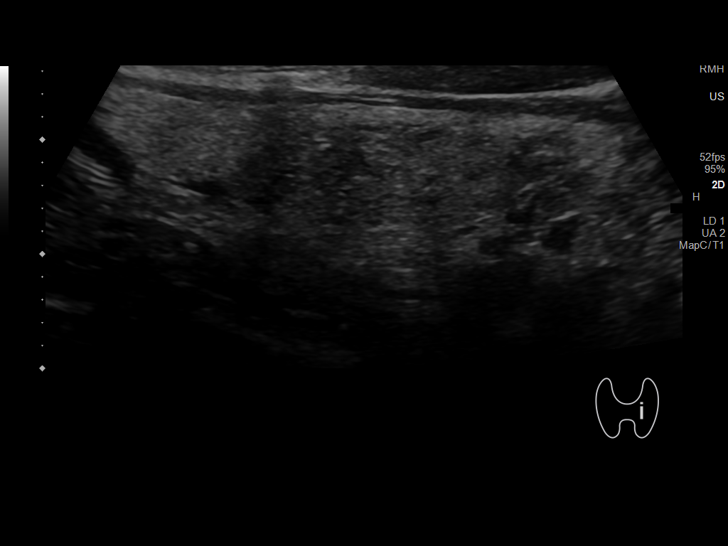
[im 31/50]
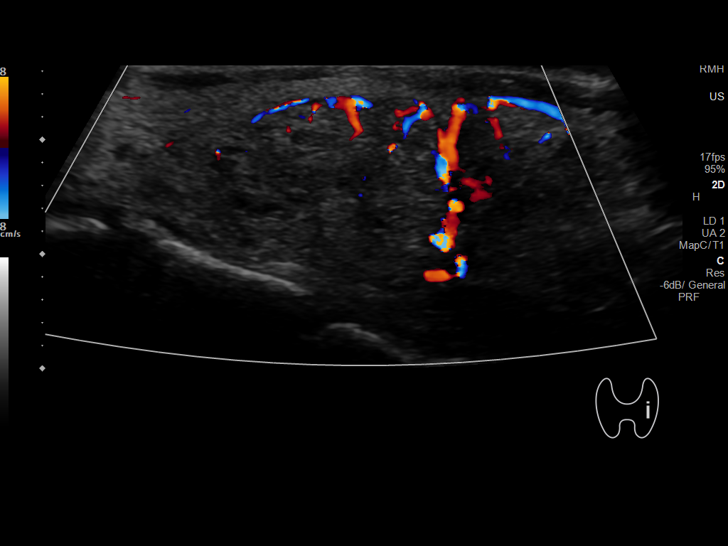
[im 35/50]
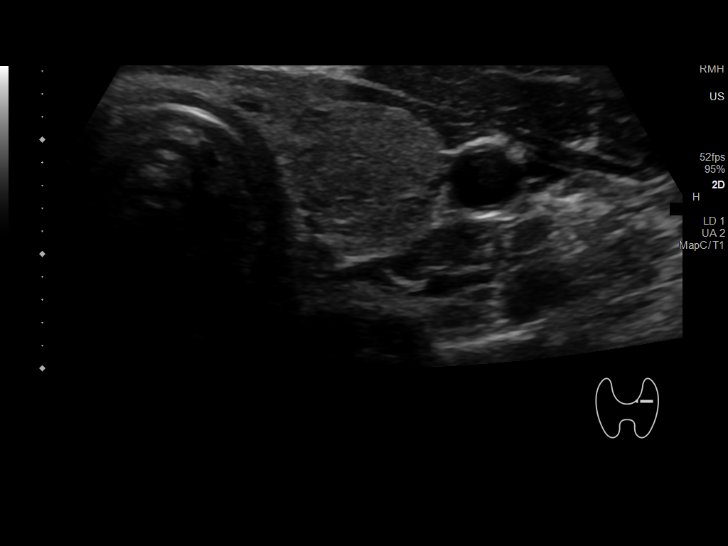
[im 39/50]
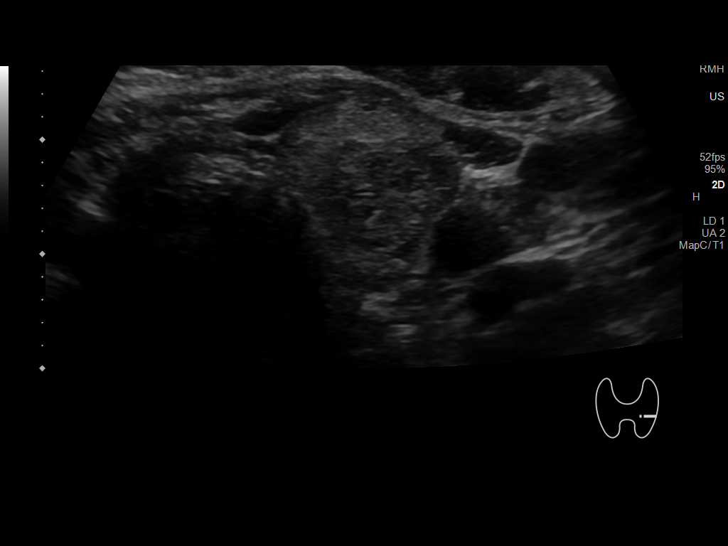
[im 43/50]
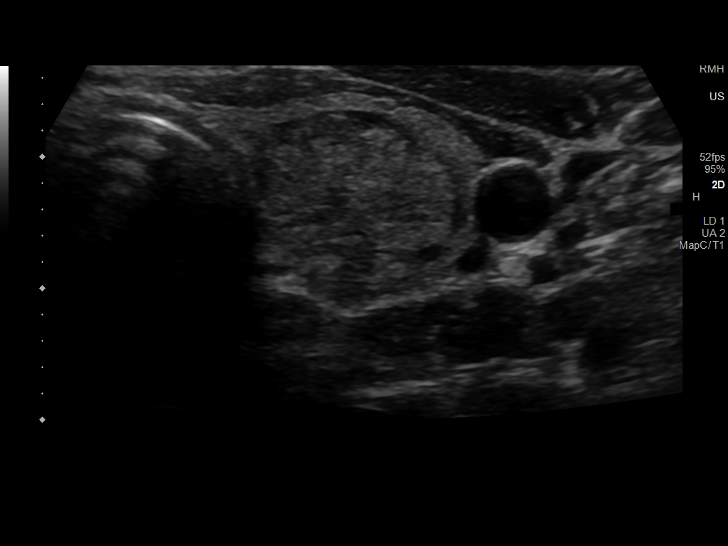
[im 47/50]
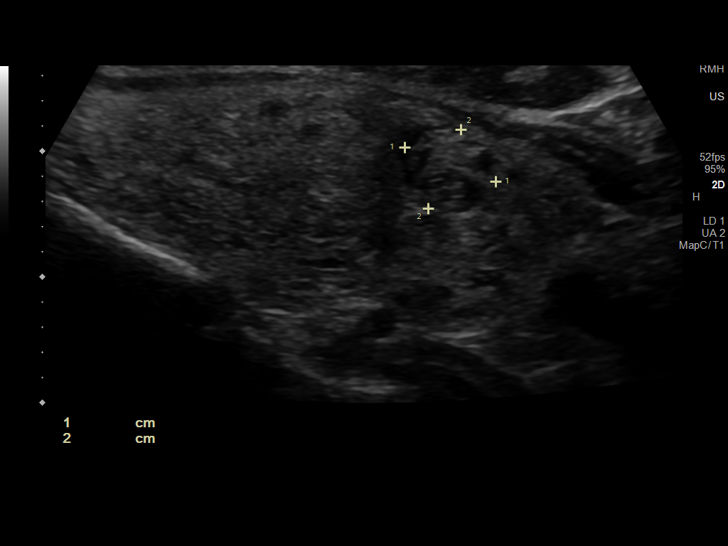

[12 of 25 positions shown; findings below may reference images not displayed]

FINDINGS: Parenchymal Echotexture: Mildly heterogenous

Isthmus: 0.3 cm

Right lobe: 4.9 cm x 1.9 cm x 2.5 cm

Left lobe: 5.3 cm x 1.9 cm x 1.7 cm

_________________________________________________________

Estimated total number of nodules >/= 1 cm: 3

Number of spongiform nodules >/=  2 cm not described below (TR1): 0

Number of mixed cystic and solid nodules >/= 1.5 cm not described
below (TR2): 0

_________________________________________________________

Nodule # 1:

Location: Right; Inferior

Maximum size: 2.6 cm; Other 2 dimensions: 1.7 cm x 2.0 cm

Composition: solid/almost completely solid (2)

Echogenicity: hypoechoic (2)

Shape: not taller-than-wide (0)

Margins: ill-defined (0)

Echogenic foci: none (0)

ACR TI-RADS total points: 4.

ACR TI-RADS risk category: TR4 (4-6 points).

ACR TI-RADS recommendations:

Nodule meets criteria for biopsy

_________________________________________________________

Nodule # 2:

Location: Right; Mid

Maximum size: 1.3 cm; Other 2 dimensions: 0.8 cm x 1.2 cm

Composition: mixed cystic and solid (1)

Echogenicity: hypoechoic (2)

Shape: not taller-than-wide (0)

Margins: ill-defined (0)

Echogenic foci: none (0)

ACR TI-RADS total points: 3.

ACR TI-RADS risk category: TR1 (0-1 points).

ACR TI-RADS recommendations:

Nodule does not meet criteria for surveillance or biopsy

_________________________________________________________

Nodule # 3:

Location: Left; Inferior

Maximum size: 2.7 cm; Other 2 dimensions: 1.4 cm x 1.6 cm

Composition: solid/almost completely solid (2)

Echogenicity: isoechoic (1)

Shape: not taller-than-wide (0)

Margins: ill-defined (0)

Echogenic foci: none (0)

ACR TI-RADS total points: 3.

ACR TI-RADS risk category: TR3 (3 points).

ACR TI-RADS recommendations:

Nodule meets criteria for biopsy

_________________________________________________________

Nodule # 4:

Location: Left; Inferior

Maximum size: 0.8 cm; Other 2 dimensions: 0.7 cm x 0.7 cm

Composition: cannot determine (2)

Echogenicity: hypoechoic (2)

Shape: not taller-than-wide (0)

Margins: ill-defined (0)

Echogenic foci: none (0)

ACR TI-RADS total points: 4.

ACR TI-RADS risk category: TR4 (4-6 points).

ACR TI-RADS recommendations:

Nodule does not meet criteria for surveillance or biopsy

_________________________________________________________

No adenopathy
IMPRESSION: Multinodular thyroid.

Right inferior thyroid nodule (labeled 1, 2.6 cm, TR 4), and the
left inferior thyroid nodule (labeled 3, 2.7 cm, TR 3) both meet
criteria for biopsy, as designated by the newly established ACR
TI-RADS criteria, and referral for biopsy is recommended.

Recommendations follow those established by the new ACR TI-RADS
criteria ([HOSPITAL] 5125;[DATE]).

## 2023-02-04 ENCOUNTER — Other Ambulatory Visit: Payer: Commercial Managed Care - HMO

## 2023-02-04 ENCOUNTER — Telehealth: Payer: Self-pay | Admitting: Adult Health

## 2023-02-04 DIAGNOSIS — R053 Chronic cough: Secondary | ICD-10-CM

## 2023-02-04 NOTE — Telephone Encounter (Signed)
PT needs referral to a Pulmonologist  in [facility]. States she is now with [facility] [employer]. Which is another reason for the needed change. Referral and recommendation.  Pls call [phone]   Also needs a referral for a facility for her CT scan as well. [name] has already put in an order w/[facility] which she can not use.

## 2023-02-07 NOTE — Telephone Encounter (Signed)
Called patient but she did not answer. Left message for patient to call back.  

## 2023-02-10 NOTE — Telephone Encounter (Signed)
Pt. Calling back to speak to nurse

## 2023-02-11 NOTE — Telephone Encounter (Signed)
Patient called the office back stating that she needs to have a referral placed for her to see a pulmonologist in [place]. Pt has moved to [facility] and now has insurance with [facility] and needs to make sure the referral is for pulmonologist with [facility].   Referral has been placed. Nothing further needed.

## 2023-04-26 ENCOUNTER — Ambulatory Visit: Payer: Commercial Managed Care - HMO | Admitting: Internal Medicine
